# Patient Record
Sex: Female | Born: 1940 | Race: White | Hispanic: No | Smoking: Former smoker
Health system: Southern US, Community
[De-identification: ages and names within clinical notes are randomized; demographics above are authoritative.]

## PROBLEM LIST (undated history)

## (undated) DIAGNOSIS — R319 Hematuria, unspecified: Secondary | ICD-10-CM

## (undated) DIAGNOSIS — J189 Pneumonia, unspecified organism: Secondary | ICD-10-CM

## (undated) DIAGNOSIS — I10 Essential (primary) hypertension: Secondary | ICD-10-CM

## (undated) DIAGNOSIS — I8390 Asymptomatic varicose veins of unspecified lower extremity: Secondary | ICD-10-CM

## (undated) DIAGNOSIS — K5790 Diverticulosis of intestine, part unspecified, without perforation or abscess without bleeding: Secondary | ICD-10-CM

## (undated) DIAGNOSIS — F419 Anxiety disorder, unspecified: Secondary | ICD-10-CM

## (undated) DIAGNOSIS — M1990 Unspecified osteoarthritis, unspecified site: Secondary | ICD-10-CM

## (undated) DIAGNOSIS — G459 Transient cerebral ischemic attack, unspecified: Secondary | ICD-10-CM

## (undated) DIAGNOSIS — N393 Stress incontinence (female) (male): Secondary | ICD-10-CM

## (undated) DIAGNOSIS — R350 Frequency of micturition: Secondary | ICD-10-CM

## (undated) DIAGNOSIS — I639 Cerebral infarction, unspecified: Secondary | ICD-10-CM

## (undated) DIAGNOSIS — E785 Hyperlipidemia, unspecified: Secondary | ICD-10-CM

## (undated) DIAGNOSIS — Z87442 Personal history of urinary calculi: Secondary | ICD-10-CM

## (undated) DIAGNOSIS — H409 Unspecified glaucoma: Secondary | ICD-10-CM

## (undated) DIAGNOSIS — R0600 Dyspnea, unspecified: Secondary | ICD-10-CM

## (undated) DIAGNOSIS — R011 Cardiac murmur, unspecified: Secondary | ICD-10-CM

## (undated) DIAGNOSIS — F32A Depression, unspecified: Secondary | ICD-10-CM

## (undated) DIAGNOSIS — I480 Paroxysmal atrial fibrillation: Secondary | ICD-10-CM

## (undated) DIAGNOSIS — Z8701 Personal history of pneumonia (recurrent): Secondary | ICD-10-CM

## (undated) DIAGNOSIS — F329 Major depressive disorder, single episode, unspecified: Secondary | ICD-10-CM

## (undated) HISTORY — DX: Asymptomatic varicose veins of unspecified lower extremity: I83.90

## (undated) HISTORY — DX: Personal history of pneumonia (recurrent): Z87.01

## (undated) HISTORY — DX: Transient cerebral ischemic attack, unspecified: G45.9

## (undated) HISTORY — PX: CATARACT EXTRACTION: SUR2

## (undated) HISTORY — PX: COLONOSCOPY: SHX174

## (undated) HISTORY — DX: Depression, unspecified: F32.A

## (undated) HISTORY — PX: TONSILLECTOMY: SUR1361

## (undated) HISTORY — PX: LITHOTRIPSY: SUR834

## (undated) HISTORY — DX: Essential (primary) hypertension: I10

## (undated) HISTORY — PX: ABLATION SAPHENOUS VEIN W/ RFA: SUR11

## (undated) HISTORY — DX: Major depressive disorder, single episode, unspecified: F32.9

## (undated) HISTORY — PX: EYE SURGERY: SHX253

## (undated) HISTORY — DX: Hyperlipidemia, unspecified: E78.5

## (undated) HISTORY — PX: ABDOMINAL HYSTERECTOMY: SHX81

## (undated) HISTORY — DX: Unspecified glaucoma: H40.9

---
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
## 2000-09-13 ENCOUNTER — Ambulatory Visit (HOSPITAL_COMMUNITY): Admission: RE | Admit: 2000-09-13 | Discharge: 2000-09-13 | Payer: Self-pay | Admitting: Internal Medicine

## 2000-09-13 ENCOUNTER — Encounter: Payer: Self-pay | Admitting: Internal Medicine

## 2001-04-08 ENCOUNTER — Ambulatory Visit (HOSPITAL_COMMUNITY): Admission: RE | Admit: 2001-04-08 | Discharge: 2001-04-08 | Payer: Self-pay | Admitting: Internal Medicine

## 2001-04-08 ENCOUNTER — Encounter: Payer: Self-pay | Admitting: Internal Medicine

## 2002-11-26 ENCOUNTER — Ambulatory Visit (HOSPITAL_COMMUNITY): Admission: RE | Admit: 2002-11-26 | Discharge: 2002-11-26 | Payer: Self-pay | Admitting: Family Medicine

## 2002-11-26 ENCOUNTER — Encounter: Payer: Self-pay | Admitting: Family Medicine

## 2004-05-10 ENCOUNTER — Ambulatory Visit (HOSPITAL_COMMUNITY): Admission: RE | Admit: 2004-05-10 | Discharge: 2004-05-10 | Payer: Self-pay | Admitting: Internal Medicine

## 2005-08-21 ENCOUNTER — Ambulatory Visit (HOSPITAL_COMMUNITY): Admission: RE | Admit: 2005-08-21 | Discharge: 2005-08-21 | Payer: Self-pay | Admitting: Family Medicine

## 2006-05-10 ENCOUNTER — Ambulatory Visit: Payer: Self-pay | Admitting: Vascular Surgery

## 2006-05-15 ENCOUNTER — Ambulatory Visit: Payer: Self-pay | Admitting: Vascular Surgery

## 2006-07-17 ENCOUNTER — Ambulatory Visit: Payer: Self-pay | Admitting: Vascular Surgery

## 2006-07-23 ENCOUNTER — Ambulatory Visit (HOSPITAL_COMMUNITY): Admission: RE | Admit: 2006-07-23 | Discharge: 2006-07-23 | Payer: Self-pay | Admitting: Internal Medicine

## 2006-08-16 ENCOUNTER — Ambulatory Visit: Payer: Self-pay | Admitting: Vascular Surgery

## 2006-09-13 ENCOUNTER — Ambulatory Visit: Payer: Self-pay | Admitting: Vascular Surgery

## 2006-10-10 ENCOUNTER — Ambulatory Visit: Payer: Self-pay | Admitting: Vascular Surgery

## 2007-04-15 ENCOUNTER — Ambulatory Visit: Payer: Self-pay | Admitting: Cardiovascular Disease

## 2007-04-19 ENCOUNTER — Ambulatory Visit (HOSPITAL_COMMUNITY): Admission: RE | Admit: 2007-04-19 | Discharge: 2007-04-19 | Payer: Self-pay | Admitting: Cardiovascular Disease

## 2007-04-19 ENCOUNTER — Ambulatory Visit: Payer: Self-pay | Admitting: Internal Medicine

## 2007-08-23 ENCOUNTER — Ambulatory Visit (HOSPITAL_COMMUNITY): Admission: RE | Admit: 2007-08-23 | Discharge: 2007-08-23 | Payer: Self-pay | Admitting: General Surgery

## 2007-12-19 ENCOUNTER — Ambulatory Visit: Payer: Self-pay | Admitting: Cardiology

## 2007-12-19 ENCOUNTER — Ambulatory Visit (HOSPITAL_COMMUNITY): Admission: RE | Admit: 2007-12-19 | Discharge: 2007-12-19 | Payer: Self-pay | Admitting: Cardiovascular Disease

## 2007-12-19 ENCOUNTER — Encounter: Payer: Self-pay | Admitting: Cardiovascular Disease

## 2008-12-03 ENCOUNTER — Telehealth: Payer: Self-pay | Admitting: Cardiovascular Disease

## 2008-12-04 ENCOUNTER — Encounter: Payer: Self-pay | Admitting: Cardiology

## 2008-12-21 DIAGNOSIS — I10 Essential (primary) hypertension: Secondary | ICD-10-CM | POA: Insufficient documentation

## 2008-12-21 DIAGNOSIS — F329 Major depressive disorder, single episode, unspecified: Secondary | ICD-10-CM

## 2008-12-29 ENCOUNTER — Encounter: Payer: Self-pay | Admitting: Adult Health

## 2008-12-29 ENCOUNTER — Encounter (INDEPENDENT_AMBULATORY_CARE_PROVIDER_SITE_OTHER): Payer: Self-pay | Admitting: *Deleted

## 2008-12-29 ENCOUNTER — Ambulatory Visit (HOSPITAL_COMMUNITY): Admission: RE | Admit: 2008-12-29 | Discharge: 2008-12-29 | Payer: Self-pay | Admitting: Cardiology

## 2008-12-29 ENCOUNTER — Ambulatory Visit: Payer: Self-pay | Admitting: Cardiology

## 2008-12-29 DIAGNOSIS — R5383 Other fatigue: Secondary | ICD-10-CM

## 2008-12-29 DIAGNOSIS — R5381 Other malaise: Secondary | ICD-10-CM | POA: Insufficient documentation

## 2008-12-29 DIAGNOSIS — R9439 Abnormal result of other cardiovascular function study: Secondary | ICD-10-CM | POA: Insufficient documentation

## 2008-12-29 DIAGNOSIS — R0602 Shortness of breath: Secondary | ICD-10-CM

## 2008-12-29 LAB — CONVERTED CEMR LAB
CO2: 24 meq/L (ref 19–32)
Chloride: 103 meq/L (ref 96–112)
Eosinophils Absolute: 0.1 10*3/uL (ref 0.0–0.7)
Eosinophils Relative: 2 % (ref 0–5)
HCT: 43.6 % (ref 36.0–46.0)
Lymphs Abs: 1.6 10*3/uL (ref 0.7–4.0)
MCHC: 32.1 g/dL (ref 30.0–36.0)
MCV: 96 fL (ref 78.0–100.0)
Monocytes Relative: 9 % (ref 3–12)
Platelets: 177 10*3/uL (ref 150–400)
RBC: 4.54 M/uL (ref 3.87–5.11)
Sodium: 142 meq/L (ref 135–145)
WBC: 6.5 10*3/uL (ref 4.0–10.5)
aPTT: 31 s

## 2009-01-01 ENCOUNTER — Ambulatory Visit: Payer: Self-pay | Admitting: Cardiology

## 2009-01-01 ENCOUNTER — Inpatient Hospital Stay (HOSPITAL_BASED_OUTPATIENT_CLINIC_OR_DEPARTMENT_OTHER): Admission: RE | Admit: 2009-01-01 | Discharge: 2009-01-01 | Payer: Self-pay | Admitting: Cardiology

## 2009-01-01 ENCOUNTER — Encounter (INDEPENDENT_AMBULATORY_CARE_PROVIDER_SITE_OTHER): Payer: Self-pay | Admitting: *Deleted

## 2009-01-05 ENCOUNTER — Encounter: Payer: Self-pay | Admitting: Cardiology

## 2009-01-06 LAB — CONVERTED CEMR LAB
BUN: 18 mg/dL (ref 6–23)
CO2: 29 meq/L (ref 19–32)
Chloride: 101 meq/L (ref 96–112)
Creatinine, Ser: 0.73 mg/dL (ref 0.40–1.20)
Glucose, Bld: 111 mg/dL — ABNORMAL HIGH (ref 70–99)

## 2009-01-07 ENCOUNTER — Encounter: Payer: Self-pay | Admitting: Cardiology

## 2009-01-22 ENCOUNTER — Ambulatory Visit: Payer: Self-pay | Admitting: Cardiology

## 2010-06-23 LAB — POCT I-STAT 3, VENOUS BLOOD GAS (G3P V)
Acid-Base Excess: 1 mmol/L (ref 0.0–2.0)
O2 Saturation: 71 %

## 2010-06-23 LAB — POCT I-STAT 3, ART BLOOD GAS (G3+)
TCO2: 31 mmol/L (ref 0–100)
pH, Arterial: 7.424 — ABNORMAL HIGH (ref 7.350–7.400)

## 2010-08-02 NOTE — Assessment & Plan Note (Signed)
[facility]                       [facility] [facility] OFFICE NOTE   NAME:Kim Dixon, Kim Dixon                   MRN:          [id number]  DATE:04/15/2007                            DOB:          10-12-1940    This is a delightful overweight middle-aged white female referred by Dr.  Phillips Odor.  She has been having palpitations, dyspnea.  Coronary risk  factor hypertension, hyperlipidemia.  The patient had palpitations for  about 2 weeks.  They are intermittent.  They both lasts as long as 10  minutes.  They occur throughout the day.  She did not get a Holter  monitor.  She was not sure if they are related to any food that she  might of had or an allergy.  They seem to subsided over the last 2  weeks.  She has chronic exertional dyspnea that she ascribes to her  size.  She is a nonsmoker.  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  The patient feels that her blood pressure cholesterol  been under good control.  She has been compliant with her meds.  She is  extremely scared about her heart.  Her mother had bypass surgery before  and she seems real leery of having coronary artery disease.  The  patient's review of systems otherwise negative.  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  She has had vein stripping done by Dr.  Hart Rochester in both lower extremities recently.   She has not had a previous stress test or echo.   Her current medications include Hyzaar 12.5, Lipitor 10 a day, Lexapro  10 a day, Premarin 0.3 days, lactin eyedrops to both eyes q.h.s.,  multivitamins.  She denies any allergies.   FAMILY HISTORY:  Remarkable for father dying of lung cancer and an old  age.  Mother having bypass surgery in her 65s patient is retired.   She is happily married.  She has three children multiple  grandchildren.  She is active she enjoys pottery.  She is retired Consulting civil engineer.  She does not drink or smoke.   Past surgical history includes previous tonsillectomy, hysterectomy.   EXAM:  Is remarkable for weight of 263.  Normal affect, blood pressure  120/76, pulse 57 regular.  No no abnormal listed beats arrhythmias in PAC, PVCs.  Respiratory rate 16, afebrile.  HEENT:  Unremarkable.  NECK:  Supple.  No lymphadenopathy, thyromegaly, JVP elevation.  LUNGS:  Clear diaphragmatic motion.  No wheezing.  S1-S2 with distant heart sounds.  PMI not palpable.  ABDOMEN:  Benign bowel sounds are positive.  No AAA.  No past and.  Reflexes.  Distal pulses intact with mild lower extremity edema.  NEURO:  Nonfocal.  SKIN:  Warm and dry.   EKG is abnormal likely due to large amount of breast tissue with poor R  wave progression, isolated Q-wave in lead III.   IMPRESSION:  1. Palpitations of seem to be resolved probably benign.  If  recur,      give her an event monitor no indication for beta blocker at this      time.  2. Hypertension currently well-controlled.  Continue a low-salt diet      and Hyzaar.  3. Hyperlipidemia.  Continue Lipitor 10 mg a day.  Lipid liver profile      in 6 months.  4. Exertional dyspnea.  Doubt this is an anginal equivalent been given      the patient's abnormal EKG with poor R wave progression and concern      for coronary disease.  She had been referred for a stress Myoview      in an echocardiogram.  We will assess RV and LV function and rule      out her dyspnea is an anginal equivalent.   Overall I think the patient is doing fine.  I suspect that her symptoms  do not relate to significant structural heart disease.  She needs to  lose weight and she knows this.   I will see her in a p.r.n. basis of her stress test and echo are normal.     Peter C. Eden Emms, MD, [facility]  Electronically Signed    PCN/MedQ  DD: 04/15/2007  DT: 04/15/2007  Job #:  [id number]   cc:   Corrie Mckusick, M.D.

## 2010-08-02 NOTE — H&P (Signed)
NAME:  Kim Dixon, Kim Dixon NO.:  [id number]   MEDICAL RECORD NO.:  [id number]          PATIENT TYPE:  AMB   LOCATION:  DAY                           FACILITY:  [facility]   PHYSICIAN:  Dalia Heading, M.D.  DATE OF BIRTH:  02/05/1941   DATE OF ADMISSION:  DATE OF DISCHARGE:  LH                              HISTORY & PHYSICAL   CHIEF COMPLAINT:  Sebaceous cyst, back, recurrent.   HISTORY OF PRESENT ILLNESS:  The patient is a 70 year old white female  who is referred for evaluation and treatment of a recurrent sebaceous  cyst on her back.  It has been present for the past few years, recently  has increased in size and is causing her discomfort.   PAST MEDICAL HISTORY:  Depression and hypertension.   PAST SURGICAL HISTORY:  Hysterectomy and child birth.   CURRENT MEDICATIONS:  Premarin, Lexapro, Lipitor, Hyzaar, Dilantin, eye  drops, and vitamin C.   ALLERGIES:  ALEVE.   REVIEW OF SYSTEMS:  Noncontributory.   PHYSICAL EXAMINATION:  GENERAL:  The patient is a well-developed and  well-nourished white female in no acute distress.  LUNGS:  Clear to auscultation with equal breath sounds bilaterally.  HEART:  Regular rate and rhythm without S3, S4, or murmurs.  BACK:  A 3-cm sebaceous cyst in the upper portion of her back along the  midline.   IMPRESSION:  Sebaceous cyst, back.   PLAN:  The patient is scheduled for an excision of the sebaceous cyst,  back on August 23, 2007.  The risks and benefits of the procedure including  bleeding, infection, and recurrence of the cyst were fully explained to  the patient, gave informed consent.      Dalia Heading, M.D.  Electronically Signed     MAJ/MEDQ  D:  08/20/2007  T:  08/21/2007  Job:  [id number]   cc:   Short Stay Jeani Hawking   [facility]

## 2010-08-02 NOTE — Procedures (Signed)
NAME:  Kim Dixon, Kim Dixon NO.:  [id number]   MEDICAL RECORD NO.:  [id number]          PATIENT TYPE:  OUT   LOCATION:  RAD                           FACILITY:  [facility]   PHYSICIAN:  Pricilla Riffle, MD, FACCDATE OF BIRTH:  [birth year]   DATE OF PROCEDURE:  DATE OF DISCHARGE:                                ECHOCARDIOGRAM   REFERRING PHYSICIAN:  Madelin Rear. Sherwood Gambler, MD.   TEST/INDICATION:  A 70 year old with CAD, abnormal EKG.   A 2-D echo with echo Doppler:  Left ventricle is normal in size and  diastolic dimension of 41 mm.  The interventricular septum and posterior  wall are mildly thickened at 14 and 12 mm each.  Left atrium is upper  normal at 40 mm.  Right atrium and right ventricle are normal.  Aortic  root is normal at 27 mm.   The aortic valve is normal with no insufficiency.  Mitral valve, she has  a mildly myxomatous appearance, but no insufficiency.  No prolapse.  There is moderate annular calcification.   Pulmonic valve is normal with no insufficiency.  Tricuspid valve is  normal with trace insufficiency.   Overall LV systolic function is normal with an LVEF of approximately 55-  60%.  There is hypokinesis of the proximal inferior wall.   Note by Doppler, there appears to be mild diastolic dysfunction.   RV systolic function is normal.   No pericardial effusion is seen.   IMPRESSION:  Mild left ventricular hypertrophy.  Left ventricular  ejection fraction normal.  Difficult window, but basal inferior  hypokinesis.  Trace tricuspid insufficiency.      Pricilla Riffle, MD, [facility]  Electronically Signed     PVR/MEDQ  D:  04/19/2007  T:  04/19/2007  Job:  [id number]   cc:   Madelin Rear. Sherwood Gambler, MD  Fax: [phone]   Noralyn Pick. Eden Emms, MD, [facility]  [address]  [address]  [place]  [place] [address]

## 2010-08-02 NOTE — Op Note (Signed)
NAME:  Kim Dixon, MERKIN NO.:  [id number]   MEDICAL RECORD NO.:  [id number]          PATIENT TYPE:  AMB   LOCATION:  DAY                           FACILITY:  [facility]   PHYSICIAN:  Dalia Heading, M.D.  DATE OF BIRTH:  Nov 25, 1940   DATE OF PROCEDURE:  08/23/2007  DATE OF DISCHARGE:                               OPERATIVE REPORT   PREOPERATIVE DIAGNOSIS:  Recurrent sebaceous cyst, back.   POSTOPERATIVE DIAGNOSIS:  Recurrent sebaceous cyst, back.   PROCEDURE:  Excision of sebaceous cyst, back.   SURGEON:  Dalia Heading, MD   ANESTHESIA:  Local.   INDICATIONS:  The patient is a 70 year old white female, who presents  with a recurrent sebaceous cyst on her back.  It measures approximately  3 cm in its greatest diameter.  The risks and benefits of the procedure  including bleeding, infection, recurrence of the cyst were fully  explained to the patient, gave informed consent.   PROCEDURE NOTE:  The patient was placed in the right lateral decubitus  position.  The upper back was prepped and draped using the usual sterile  technique with Betadine.  Surgical site confirmation was performed.  1%  Xylocaine was used for local anesthesia.   An elliptical incision was made around the punctum.  The sebaceous cyst  was then removed in total without difficulty.  The wound was irrigated  with normal saline.  The subcutaneous layer was reapproximated using 4-0  Vicryl subcuticular suture.  Dermabond was then applied.   All tape and needle counts were correct at the end of procedure.  The  patient was transferred back to day surgery in stable condition.   COMPLICATIONS:  None.   SPECIMEN:  None.   BLOOD LOSS:  Minimal.      Dalia Heading, M.D.  Electronically Signed     MAJ/MEDQ  D:  08/23/2007  T:  08/23/2007  Job:  [id number]   cc:   [facility]

## 2011-01-10 ENCOUNTER — Encounter: Payer: Self-pay | Admitting: Cardiovascular Disease

## 2011-01-23 ENCOUNTER — Other Ambulatory Visit (HOSPITAL_COMMUNITY): Payer: Self-pay | Admitting: Family Medicine

## 2011-01-23 DIAGNOSIS — Z139 Encounter for screening, unspecified: Secondary | ICD-10-CM

## 2011-01-23 DIAGNOSIS — Z1231 Encounter for screening mammogram for malignant neoplasm of breast: Secondary | ICD-10-CM

## 2011-01-24 ENCOUNTER — Telehealth: Payer: Self-pay | Admitting: Internal Medicine

## 2011-01-24 NOTE — Telephone Encounter (Signed)
Received copies from [facility] 01/24/11. Forwarded  6pages to Dr. Nobie Putnam review.

## 2011-01-26 ENCOUNTER — Ambulatory Visit (HOSPITAL_COMMUNITY): Payer: Self-pay

## 2011-01-26 ENCOUNTER — Other Ambulatory Visit (HOSPITAL_COMMUNITY): Payer: Self-pay

## 2011-02-06 ENCOUNTER — Ambulatory Visit (HOSPITAL_COMMUNITY)
Admission: RE | Admit: 2011-02-06 | Discharge: 2011-02-06 | Disposition: A | Discharge status: Home / Self Care | Payer: Medicare Other | Source: Ambulatory Visit | Attending: Family Medicine | Admitting: Family Medicine

## 2011-02-06 DIAGNOSIS — Z1231 Encounter for screening mammogram for malignant neoplasm of breast: Secondary | ICD-10-CM | POA: Insufficient documentation

## 2011-02-06 DIAGNOSIS — Z139 Encounter for screening, unspecified: Secondary | ICD-10-CM

## 2011-02-06 DIAGNOSIS — M818 Other osteoporosis without current pathological fracture: Secondary | ICD-10-CM | POA: Insufficient documentation

## 2011-02-06 DIAGNOSIS — Z780 Asymptomatic menopausal state: Secondary | ICD-10-CM | POA: Insufficient documentation

## 2011-03-07 ENCOUNTER — Encounter: Payer: Self-pay | Admitting: Internal Medicine

## 2011-04-05 ENCOUNTER — Ambulatory Visit (AMBULATORY_SURGERY_CENTER): Payer: Medicare Other | Admitting: *Deleted

## 2011-04-05 VITALS — Ht 67.0 in | Wt 262.9 lb

## 2011-04-05 DIAGNOSIS — Z1211 Encounter for screening for malignant neoplasm of colon: Secondary | ICD-10-CM

## 2011-04-05 MED ORDER — PEG-KCL-NACL-NASULF-NA ASC-C 100 G PO SOLR: ORAL | Status: DC

## 2011-04-17 ENCOUNTER — Telehealth: Payer: Self-pay | Admitting: *Deleted

## 2011-04-17 NOTE — Telephone Encounter (Signed)
Spoke with patient and she states she is having trouble getting her insurance to cover procedure. States the "state office" closed due to weather and no one here could give her a ballpark number of what 25 % of procedure would be so she wants to cancel.

## 2011-04-17 NOTE — Telephone Encounter (Signed)
Received a telephone note from answering service that patient wants to cancel procedure on Wed. 30th bescause "you can't answer her questions, so she will look elsewhere." Attempted to call patient to discuss her questions but she is not home at present. Per per answering phone she should be returning in 1 1/2 hours.

## 2011-04-19 ENCOUNTER — Other Ambulatory Visit: Payer: Medicare Other | Admitting: Internal Medicine

## 2011-04-20 ENCOUNTER — Encounter (INDEPENDENT_AMBULATORY_CARE_PROVIDER_SITE_OTHER): Payer: Self-pay | Admitting: *Deleted

## 2012-02-13 ENCOUNTER — Other Ambulatory Visit (HOSPITAL_COMMUNITY): Payer: Self-pay | Admitting: Family Medicine

## 2012-02-13 DIAGNOSIS — Z0000 Encounter for general adult medical examination without abnormal findings: Secondary | ICD-10-CM

## 2012-02-13 DIAGNOSIS — Z139 Encounter for screening, unspecified: Secondary | ICD-10-CM

## 2012-02-20 ENCOUNTER — Ambulatory Visit (HOSPITAL_COMMUNITY): Payer: Medicare Other

## 2012-02-28 ENCOUNTER — Encounter (INDEPENDENT_AMBULATORY_CARE_PROVIDER_SITE_OTHER): Payer: Self-pay | Admitting: *Deleted

## 2012-02-29 ENCOUNTER — Ambulatory Visit (HOSPITAL_COMMUNITY): Payer: Medicare Other

## 2012-03-01 ENCOUNTER — Ambulatory Visit (HOSPITAL_COMMUNITY)
Admission: RE | Admit: 2012-03-01 | Discharge: 2012-03-01 | Disposition: A | Discharge status: Home / Self Care | Payer: Medicare Other | Source: Ambulatory Visit | Attending: Family Medicine | Admitting: Family Medicine

## 2012-03-01 DIAGNOSIS — Z1231 Encounter for screening mammogram for malignant neoplasm of breast: Secondary | ICD-10-CM | POA: Insufficient documentation

## 2012-03-01 DIAGNOSIS — Z0000 Encounter for general adult medical examination without abnormal findings: Secondary | ICD-10-CM

## 2012-03-01 DIAGNOSIS — Z139 Encounter for screening, unspecified: Secondary | ICD-10-CM

## 2013-04-18 ENCOUNTER — Encounter (INDEPENDENT_AMBULATORY_CARE_PROVIDER_SITE_OTHER): Payer: Self-pay | Admitting: *Deleted

## 2013-04-24 ENCOUNTER — Other Ambulatory Visit (INDEPENDENT_AMBULATORY_CARE_PROVIDER_SITE_OTHER): Payer: Self-pay | Admitting: *Deleted

## 2013-04-24 DIAGNOSIS — Z1211 Encounter for screening for malignant neoplasm of colon: Secondary | ICD-10-CM

## 2013-04-25 ENCOUNTER — Encounter (INDEPENDENT_AMBULATORY_CARE_PROVIDER_SITE_OTHER): Payer: Self-pay | Admitting: *Deleted

## 2013-04-25 NOTE — Telephone Encounter (Signed)
This encounter was created in error - please disregard.

## 2013-05-26 ENCOUNTER — Other Ambulatory Visit (HOSPITAL_COMMUNITY): Payer: Self-pay | Admitting: Internal Medicine

## 2013-05-26 DIAGNOSIS — Z1231 Encounter for screening mammogram for malignant neoplasm of breast: Secondary | ICD-10-CM

## 2013-05-29 ENCOUNTER — Ambulatory Visit (HOSPITAL_COMMUNITY)
Admission: RE | Admit: 2013-05-29 | Discharge: 2013-05-29 | Disposition: A | Discharge status: Home / Self Care | Payer: Medicare Other | Source: Ambulatory Visit | Attending: Internal Medicine | Admitting: Internal Medicine

## 2013-05-29 DIAGNOSIS — Z1231 Encounter for screening mammogram for malignant neoplasm of breast: Secondary | ICD-10-CM | POA: Insufficient documentation

## 2013-06-05 ENCOUNTER — Encounter (HOSPITAL_COMMUNITY): Payer: Self-pay

## 2013-06-06 ENCOUNTER — Telehealth (INDEPENDENT_AMBULATORY_CARE_PROVIDER_SITE_OTHER): Payer: Self-pay | Admitting: *Deleted

## 2013-06-06 NOTE — Telephone Encounter (Signed)
  Procedure: tcs  Reason/Indication:  screening  Has patient had this procedure before?  Yes, more than 10 years ago  If so, when, by whom and where?    Is there a family history of colon cancer?  no  Who?  What age when diagnosed?    Is patient diabetic?   no      Does patient have prosthetic heart valve?  no  Do you have a pacemaker?  no  Has patient ever had endocarditis? no  Has patient had joint replacement within last 12 months?  no  Does patient tend to be constipated or take laxatives? no  Is patient on Coumadin, Plavix and/or Aspirin? no  Medications: citalopram 20 mg daily, furosemide 20 mg tid, losartan/hctz 50/12 mg daily, meloxicam 7.5 mg daily, simvastatin 10 mg daily, lumegan 0.03% 1 drop each eye nightly, vit c 3000 mg daily, vit d 1000 iu daily  Allergies: aleve  Medication Adjustment:   Procedure date & time: 06/18/13

## 2013-06-09 NOTE — Telephone Encounter (Signed)
agree

## 2013-06-18 ENCOUNTER — Encounter (HOSPITAL_COMMUNITY)
Admission: RE | Disposition: A | Discharge status: Home / Self Care | Payer: Self-pay | Source: Ambulatory Visit | Attending: Internal Medicine

## 2013-06-18 ENCOUNTER — Encounter (HOSPITAL_COMMUNITY): Payer: Self-pay | Admitting: *Deleted

## 2013-06-18 ENCOUNTER — Ambulatory Visit (HOSPITAL_COMMUNITY)
Admission: RE | Admit: 2013-06-18 | Discharge: 2013-06-18 | Disposition: A | Discharge status: Home / Self Care | Payer: Medicare Other | Source: Ambulatory Visit | Attending: Internal Medicine | Admitting: Internal Medicine

## 2013-06-18 DIAGNOSIS — D126 Benign neoplasm of colon, unspecified: Secondary | ICD-10-CM | POA: Insufficient documentation

## 2013-06-18 DIAGNOSIS — F329 Major depressive disorder, single episode, unspecified: Secondary | ICD-10-CM | POA: Insufficient documentation

## 2013-06-18 DIAGNOSIS — K5730 Diverticulosis of large intestine without perforation or abscess without bleeding: Secondary | ICD-10-CM

## 2013-06-18 DIAGNOSIS — Z87891 Personal history of nicotine dependence: Secondary | ICD-10-CM | POA: Insufficient documentation

## 2013-06-18 DIAGNOSIS — K644 Residual hemorrhoidal skin tags: Secondary | ICD-10-CM | POA: Insufficient documentation

## 2013-06-18 DIAGNOSIS — I10 Essential (primary) hypertension: Secondary | ICD-10-CM | POA: Insufficient documentation

## 2013-06-18 DIAGNOSIS — F3289 Other specified depressive episodes: Secondary | ICD-10-CM | POA: Insufficient documentation

## 2013-06-18 DIAGNOSIS — Z1211 Encounter for screening for malignant neoplasm of colon: Secondary | ICD-10-CM | POA: Insufficient documentation

## 2013-06-18 DIAGNOSIS — E785 Hyperlipidemia, unspecified: Secondary | ICD-10-CM | POA: Insufficient documentation

## 2013-06-18 DIAGNOSIS — Z79899 Other long term (current) drug therapy: Secondary | ICD-10-CM | POA: Insufficient documentation

## 2013-06-18 HISTORY — PX: COLONOSCOPY: SHX5424

## 2013-06-18 SURGERY — COLONOSCOPY
Anesthesia: Moderate Sedation

## 2013-06-18 MED ORDER — MIDAZOLAM HCL 5 MG/5ML IJ SOLN
INTRAMUSCULAR | Status: AC
  Filled 2013-06-18: qty 10

## 2013-06-18 MED ORDER — MEPERIDINE HCL 50 MG/ML IJ SOLN
INTRAMUSCULAR | Status: AC
  Filled 2013-06-18: qty 1

## 2013-06-18 MED ORDER — MEPERIDINE HCL 50 MG/ML IJ SOLN
INTRAMUSCULAR | Status: DC | PRN
  Administered 2013-06-18 (×2): 25 mg via INTRAVENOUS

## 2013-06-18 MED ORDER — MIDAZOLAM HCL 5 MG/5ML IJ SOLN
INTRAMUSCULAR | Status: DC | PRN
  Administered 2013-06-18 (×3): 2 mg via INTRAVENOUS

## 2013-06-18 MED ORDER — STERILE WATER FOR IRRIGATION IR SOLN
Status: DC | PRN
  Administered 2013-06-18: 09:00:00

## 2013-06-18 MED ORDER — SODIUM CHLORIDE 0.9 % IV SOLN
INTRAVENOUS | Status: DC
  Administered 2013-06-18: 08:00:00 via INTRAVENOUS

## 2013-06-18 NOTE — Op Note (Addendum)
COLONOSCOPY PROCEDURE REPORT  PATIENT:  Kim Dixon  MR#:  [id number] Birthdate:  1940-03-28, 73 y.o., female Endoscopist:  Dr. Rogene Houston, MD Referred By:  Dr. Asencion Noble, MD  Procedure Date: 06/18/2013  Procedure:   Colonoscopy with snare polypectomy.  Indications:  Patient is 74 year old Caucasian female who is here for average risk screening colonoscopy.  Informed Consent:  The procedure and risks were reviewed with the patient and informed consent was obtained.  Medications:  Demerol 50 mg IV Versed 6 mg IV  Description of procedure:  After a digital rectal exam was performed, that colonoscope was advanced from the anus through the rectum and colon to the area of the cecum, ileocecal valve and appendiceal orifice. The cecum was deeply intubated. These structures were well-seen and photographed for the record. From the level of the cecum and ileocecal valve, the scope was slowly and cautiously withdrawn. The mucosal surfaces were carefully surveyed utilizing scope tip to flexion to facilitate fold flattening as needed. The scope was pulled down into the rectum where a thorough exam including retroflexion was performed.  Findings:   Prep excellent. Small polyp at ascending colon coagulated using snare tip. 10 mm pedunculated polyp with surface erosion snared from hepatic flexure. Scattered diverticula throughout the colon but most of these were at sigmoid colon. Normal rectal mucosa. Small hemorrhoids below the dentate line.   Therapeutic/Diagnostic Maneuvers Performed:  See above  Complications:  None  Cecal Withdrawal Time:  12 minutes  Impression:  Examination performed to cecum. Small polyp at ascending colon coagulated using snare tip. 10 mm pedunculated polyp with surface erosion snared from hepatic flexure. Pancolonic diverticulosis. Small external hemorrhoids.  Recommendations:  Standard instructions given. High fiber diet. I will contact patient with  biopsy results and further recommendations.  [name] U  06/18/2013 9:15 AM  CC: Dr. Asencion Noble, MD & Dr. Rayne Du ref. provider found

## 2013-06-18 NOTE — Discharge Instructions (Signed)
Resume usual medications and high fiber diet. No aspirin or NSAIDs for 1 week. No driving for 24 hours. Physician will call with biopsy results    Colonoscopy, Care After Refer to this sheet in the next few weeks. These instructions provide you with information on caring for yourself after your procedure. Your health care provider may also give you more specific instructions. Your treatment has been planned according to current medical practices, but problems sometimes occur. Call your health care provider if you have any problems or questions after your procedure. WHAT TO EXPECT AFTER THE PROCEDURE  After your procedure, it is typical to have the following:  A small amount of blood in your stool.  Moderate amounts of gas and mild abdominal cramping or bloating. HOME CARE INSTRUCTIONS  Do not drive, operate machinery, or sign important documents for 24 hours.  You may shower and resume your regular physical activities, but move at a slower pace for the first 24 hours.  Take frequent rest periods for the first 24 hours.  Walk around or put a warm pack on your abdomen to help reduce abdominal cramping and bloating.  Drink enough fluids to keep your urine clear or pale yellow.  You may resume your normal diet as instructed by your health care provider. Avoid heavy or fried foods that are hard to digest.  Avoid drinking alcohol for 24 hours or as instructed by your health care provider.  Only take over-the-counter or prescription medicines as directed by your health care provider.  If a tissue sample (biopsy) was taken during your procedure:  Do not take aspirin or blood thinners for 7 days, or as instructed by your health care provider.  Do not drink alcohol for 7 days, or as instructed by your health care provider.  Eat soft foods for the first 24 hours. SEEK MEDICAL CARE IF: You have persistent spotting of blood in your stool 2 3 days after the procedure. SEEK IMMEDIATE  MEDICAL CARE IF:  You have more than a small spotting of blood in your stool.  You pass large blood clots in your stool.  Your abdomen is swollen (distended).  You have nausea or vomiting.  You have a fever.  You have increasing abdominal pain that is not relieved with medicine. Document Released: 10/19/2003 Document Revised: 12/25/2012 Document Reviewed: 11/11/2012 [facility] Patient Information 2014 [place]. Hemorrhoids Hemorrhoids are swollen veins around the rectum or anus. There are two types of hemorrhoids:   Internal hemorrhoids. These occur in the veins just inside the rectum. They may poke through to the outside and become irritated and painful.  External hemorrhoids. These occur in the veins outside the anus and can be felt as a painful swelling or hard lump near the anus. CAUSES  Pregnancy.   Obesity.   Constipation or diarrhea.   Straining to have a bowel movement.   Sitting for long periods on the toilet.  Heavy lifting or other activity that caused you to strain.  Anal intercourse. SYMPTOMS   Pain.   Anal itching or irritation.   Rectal bleeding.   Fecal leakage.   Anal swelling.   One or more lumps around the anus.  DIAGNOSIS  Your caregiver may be able to diagnose hemorrhoids by visual examination. Other examinations or tests that may be performed include:   Examination of the rectal area with a gloved hand (digital rectal exam).   Examination of anal canal using a small tube (scope).   A blood test if you  have lost a significant amount of blood.  A test to look inside the colon (sigmoidoscopy or colonoscopy). TREATMENT Most hemorrhoids can be treated at home. However, if symptoms do not seem to be getting better or if you have a lot of rectal bleeding, your caregiver may perform a procedure to help make the hemorrhoids get smaller or remove them completely. Possible treatments include:   Placing a rubber band at the base  of the hemorrhoid to cut off the circulation (rubber band ligation).   Injecting a chemical to shrink the hemorrhoid (sclerotherapy).   Using a tool to burn the hemorrhoid (infrared light therapy).   Surgically removing the hemorrhoid (hemorrhoidectomy).   Stapling the hemorrhoid to block blood flow to the tissue (hemorrhoid stapling).  HOME CARE INSTRUCTIONS   Eat foods with fiber, such as whole grains, beans, nuts, fruits, and vegetables. Ask your doctor about taking products with added fiber in them (fibersupplements).  Increase fluid intake. Drink enough water and fluids to keep your urine clear or pale yellow.   Exercise regularly.   Go to the bathroom when you have the urge to have a bowel movement. Do not wait.   Avoid straining to have bowel movements.   Keep the anal area dry and clean. Use wet toilet paper or moist towelettes after a bowel movement.   Medicated creams and suppositories may be used or applied as directed.   Only take over-the-counter or prescription medicines as directed by your caregiver.   Take warm sitz baths for 15 20 minutes, 3 4 times a day to ease pain and discomfort.   Place ice packs on the hemorrhoids if they are tender and swollen. Using ice packs between sitz baths may be helpful.   Put ice in a plastic bag.   Place a towel between your skin and the bag.   Leave the ice on for 15 20 minutes, 3 4 times a day.   Do not use a donut-shaped pillow or sit on the toilet for long periods. This increases blood pooling and pain.  SEEK MEDICAL CARE IF:  You have increasing pain and swelling that is not controlled by treatment or medicine.  You have uncontrolled bleeding.  You have difficulty or you are unable to have a bowel movement.  You have pain or inflammation outside the area of the hemorrhoids. MAKE SURE YOU:  Understand these instructions.  Will watch your condition.  Will get help right away if you are not  doing well or get worse. Document Released: 03/03/2000 Document Revised: 02/21/2012 Document Reviewed: 01/09/2012 [facility] Patient Information 2014 [place]. Diverticulosis Diverticulosis is a common condition that develops when small pouches (diverticula) form in the wall of the colon. The risk of diverticulosis increases with age. It happens more often in people who eat a low-fiber diet. Most individuals with diverticulosis have no symptoms. Those individuals with symptoms usually experience abdominal pain, constipation, or loose stools (diarrhea). HOME CARE INSTRUCTIONS   Increase the amount of fiber in your diet as directed by your caregiver or dietician. This may reduce symptoms of diverticulosis.  Your caregiver may recommend taking a dietary fiber supplement.  Drink at least 6 to 8 glasses of water each day to prevent constipation.  Try not to strain when you have a bowel movement.  Your caregiver may recommend avoiding nuts and seeds to prevent complications, although this is still an uncertain benefit.  Only take over-the-counter or prescription medicines for pain, discomfort, or fever as directed by your  caregiver. FOODS WITH HIGH FIBER CONTENT INCLUDE:  Fruits. Apple, peach, pear, tangerine, raisins, prunes.  Vegetables. Brussels sprouts, asparagus, broccoli, cabbage, carrot, cauliflower, romaine lettuce, spinach, summer squash, tomato, winter squash, zucchini.  Starchy Vegetables. Baked beans, kidney beans, lima beans, split peas, lentils, potatoes (with skin).  Grains. Whole wheat bread, brown rice, bran flake cereal, plain oatmeal, white rice, shredded wheat, bran muffins. SEEK IMMEDIATE MEDICAL CARE IF:   You develop increasing pain or severe bloating.  You have an oral temperature above 102 F (38.9 C), not controlled by medicine.  You develop vomiting or bowel movements that are bloody or black. Document Released: 12/02/2003 Document Revised: 05/29/2011  Document Reviewed: 08/04/2009 [facility] Patient Information 2014 [place]. Colon Polyps Polyps are lumps of extra tissue growing inside the body. Polyps can grow in the large intestine (colon). Most colon polyps are noncancerous (benign). However, some colon polyps can become cancerous over time. Polyps that are larger than a pea may be harmful. To be safe, caregivers remove and test all polyps. CAUSES  Polyps form when mutations in the genes cause your cells to grow and divide even though no more tissue is needed. RISK FACTORS There are a number of risk factors that can increase your chances of getting colon polyps. They include:  Being older than 50 years.  Family history of colon polyps or colon cancer.  Long-term colon diseases, such as colitis or Crohn disease.  Being overweight.  Smoking.  Being inactive.  Drinking too much alcohol. SYMPTOMS  Most small polyps do not cause symptoms. If symptoms are present, they may include:  Blood in the stool. The stool may look dark red or black.  Constipation or diarrhea that lasts longer than 1 week. DIAGNOSIS People often do not know they have polyps until their caregiver finds them during a regular checkup. Your caregiver can use 4 tests to check for polyps:  Digital rectal exam. The caregiver wears gloves and feels inside the rectum. This test would find polyps only in the rectum.  Barium enema. The caregiver puts a liquid called barium into your rectum before taking X-rays of your colon. Barium makes your colon look white. Polyps are dark, so they are easy to see in the X-ray pictures.  Sigmoidoscopy. A thin, flexible tube (sigmoidoscope) is placed into your rectum. The sigmoidoscope has a light and tiny camera in it. The caregiver uses the sigmoidoscope to look at the last third of your colon.  Colonoscopy. This test is like sigmoidoscopy, but the caregiver looks at the entire colon. This is the most common method for  finding and removing polyps. TREATMENT  Any polyps will be removed during a sigmoidoscopy or colonoscopy. The polyps are then tested for cancer. PREVENTION  To help lower your risk of getting more colon polyps:  Eat plenty of fruits and vegetables. Avoid eating fatty foods.  Do not smoke.  Avoid drinking alcohol.  Exercise every day.  Lose weight if recommended by your caregiver.  Eat plenty of calcium and folate. Foods that are rich in calcium include milk, cheese, and broccoli. Foods that are rich in folate include chickpeas, kidney beans, and spinach. HOME CARE INSTRUCTIONS Keep all follow-up appointments as directed by your caregiver. You may need periodic exams to check for polyps. SEEK MEDICAL CARE IF: You notice bleeding during a bowel movement. Document Released: 12/01/2003 Document Revised: 05/29/2011 Document Reviewed: 05/16/2011 [facility] Patient Information 2014 [place].

## 2013-06-18 NOTE — H&P (Signed)
Kim Dixon is an 73 y.o. female.   Chief Complaint: Patient's here for colonoscopy. HPI: Patient is 73 year old Caucasian female who is here for screening colonoscopy. She denies abdominal pain change in bowel habits or rectal bleeding. Patient's last colonoscopy was over 10 years ago. Family history is negative for CRC.  Past Medical History  Diagnosis Date  . Depression   . Hypertension   . Hyperlipidemia   . Glaucoma     Past Surgical History  Procedure Laterality Date  . Abdominal hysterectomy    . Colonoscopy      Family History  Problem Relation Age of Onset  . Coronary artery disease Mother   . Lung cancer Father   . Colon cancer Neg Hx   . Stomach cancer Neg Hx    Social History:  reports that she quit smoking about 20 years ago. Her smoking use included Cigarettes. She has a 60 pack-year smoking history. She has never used smokeless tobacco. She reports that she drinks about 4.2 ounces of alcohol per week. She reports that she does not use illicit drugs.  Allergies:  Allergies  Allergen Reactions  . Naproxen Sodium     Tightness in throat    Medications Prior to Admission  Medication Sig Dispense Refill  . Ascorbic Acid (VITAMIN C) 1000 MG tablet Take 3,000 mg by mouth daily.      . bimatoprost (LUMIGAN) 0.03 % ophthalmic solution Place 1 drop into both eyes at bedtime.      . cholecalciferol (VITAMIN D) 1000 UNITS tablet Take 1,000 Units by mouth daily.      . citalopram (CELEXA) 20 MG tablet Take 20 mg by mouth daily.      . furosemide (LASIX) 20 MG tablet Take 60 mg by mouth daily.       Marland Kitchen losartan-hydrochlorothiazide (HYZAAR) 50-12.5 MG per tablet Take 1 tablet by mouth daily.      . meloxicam (MOBIC) 7.5 MG tablet Take 7.5 mg by mouth daily as needed for pain.       . simvastatin (ZOCOR) 10 MG tablet Take 10 mg by mouth at bedtime.          No results found for this or any previous visit (from the past 48 hour(s)). No results  found.  ROS  Blood pressure 160/74, pulse 55, temperature 98 F (36.7 C), temperature source Oral, resp. rate 28, height 5\' 7"  (1.702 m), weight 238 lb (107.956 kg), SpO2 96.00%. Physical Exam  Constitutional: She appears well-developed and well-nourished.  HENT:  Mouth/Throat: Oropharynx is clear and moist.  Eyes: Conjunctivae are normal. No scleral icterus.  Neck: No thyromegaly present.  Cardiovascular: Normal rate, regular rhythm and normal heart sounds.   No murmur heard. Respiratory: Effort normal and breath sounds normal.  GI: Soft. She exhibits no distension and no mass. There is no tenderness.  Musculoskeletal: She exhibits no edema.  Lymphadenopathy:    She has no cervical adenopathy.  Neurological: She is alert.  Skin: Skin is warm and dry.     Assessment/Plan Average risk screening colonoscopy.  REHMAN,NAJEEB U 06/18/2013, 8:42 AM

## 2013-06-23 ENCOUNTER — Encounter (INDEPENDENT_AMBULATORY_CARE_PROVIDER_SITE_OTHER): Payer: Self-pay | Admitting: *Deleted

## 2013-06-24 ENCOUNTER — Encounter (HOSPITAL_COMMUNITY): Payer: Self-pay | Admitting: Internal Medicine

## 2014-01-16 ENCOUNTER — Telehealth: Payer: Self-pay

## 2014-01-16 NOTE — Telephone Encounter (Signed)
Pt. called and reported bulging varicose veins in bilateral legs with right > left.  Stated she has had vein laser treatment in the remote past.  Reported she went to be fitted for knee high compression stockings this morning, and couldn't wear them, due to the increased bulging of the VV when the compression stocking was applied.  Was advised to sched. Appt. With Dr. Kellie Simmering.  Denies any open sores.  Stated has bilateral lower leg swelling from foot to knees.  Advised to wrap legs with Ace bandages foot to thigh, if unable to wear compression hose at this time.  Also encouraged to elevate her legs at regular intervals.  Will return call to pt. w/ future appt. Verb. Understanding.

## 2014-01-19 NOTE — Telephone Encounter (Signed)
Left appointment information for pt at home #/ dpm

## 2014-01-22 ENCOUNTER — Other Ambulatory Visit: Payer: Self-pay | Admitting: *Deleted

## 2014-01-22 DIAGNOSIS — I83893 Varicose veins of bilateral lower extremities with other complications: Secondary | ICD-10-CM

## 2014-01-23 ENCOUNTER — Encounter: Payer: Self-pay | Admitting: Vascular Surgery

## 2014-01-26 ENCOUNTER — Ambulatory Visit (INDEPENDENT_AMBULATORY_CARE_PROVIDER_SITE_OTHER): Payer: Medicare Other | Admitting: Vascular Surgery

## 2014-01-26 ENCOUNTER — Ambulatory Visit (HOSPITAL_COMMUNITY)
Admission: RE | Admit: 2014-01-26 | Discharge: 2014-01-26 | Disposition: A | Discharge status: Home / Self Care | Payer: Medicare Other | Source: Ambulatory Visit | Attending: Vascular Surgery | Admitting: Vascular Surgery

## 2014-01-26 ENCOUNTER — Encounter: Payer: Self-pay | Admitting: Vascular Surgery

## 2014-01-26 VITALS — BP 175/94 | HR 63 | Resp 16 | Ht 64.5 in | Wt 237.0 lb

## 2014-01-26 DIAGNOSIS — I83893 Varicose veins of bilateral lower extremities with other complications: Secondary | ICD-10-CM

## 2014-01-26 DIAGNOSIS — I83899 Varicose veins of unspecified lower extremities with other complications: Secondary | ICD-10-CM | POA: Insufficient documentation

## 2014-01-26 DIAGNOSIS — I83812 Varicose veins of left lower extremities with pain: Secondary | ICD-10-CM

## 2014-01-26 NOTE — Progress Notes (Signed)
Subjective:     Patient ID: Kim Dixon, female   DOB: Apr 16, 1940, 73 y.o.   MRN: [id number]  HPI this 73 year old female is known to me having undergone bilateral laser ablation of great saphenous veins in 2008 for painful varicosities. She had a good result. Over the past year or so she has developed large bulging varicosities particularly in the right anterior thigh and around the right knee laterally. These have become heavy and throbbing with increasing discomfort as the day progresses. She does not were elastic compression stockings at present time. She has no history of DVT or thrombophlebitis. She does have swelling in both ankles as the day progresses and has noticed some darkening of the skin.  Past Medical History  Diagnosis Date  . Depression   . Hypertension   . Hyperlipidemia   . Glaucoma   . Varicose veins     History  Substance Use Topics  . Smoking status: Former Smoker -- 2.00 packs/day for 30 years    Types: Cigarettes    Quit date: 04/04/1993  . Smokeless tobacco: Never Used  . Alcohol Use: 4.2 oz/week    7 Glasses of wine per week    Family History  Problem Relation Age of Onset  . Coronary artery disease Mother   . Lung cancer Father   . Colon cancer Neg Hx   . Stomach cancer Neg Hx     Allergies  Allergen Reactions  . Naproxen Sodium     Tightness in throat    Current outpatient prescriptions: Ascorbic Acid (VITAMIN C) 1000 MG tablet, Take 3,000 mg by mouth daily., Disp: , Rfl: ;  bimatoprost (LUMIGAN) 0.03 % ophthalmic solution, Place 1 drop into both eyes at bedtime., Disp: , Rfl: ;  cholecalciferol (VITAMIN D) 1000 UNITS tablet, Take 1,000 Units by mouth daily., Disp: , Rfl: ;  citalopram (CELEXA) 20 MG tablet, Take 20 mg by mouth daily., Disp: , Rfl:  furosemide (LASIX) 20 MG tablet, Take 60 mg by mouth daily. , Disp: , Rfl: ;  losartan-hydrochlorothiazide (HYZAAR) 50-12.5 MG per tablet, Take 1 tablet by mouth daily., Disp: , Rfl: ;  meloxicam  (MOBIC) 7.5 MG tablet, Take 7.5 mg by mouth daily as needed for pain. , Disp: , Rfl: ;  simvastatin (ZOCOR) 10 MG tablet, Take 10 mg by mouth at bedtime.  , Disp: , Rfl:   BP 175/94 mmHg  Pulse 63  Resp 16  Ht 5' 4.5" (1.638 m)  Wt 237 lb (107.502 kg)  BMI 40.07 kg/m2  Body mass index is 40.07 kg/(m^2).          Review of Systems Denies chest pain, dyspnea on exertion, PND, orthopnea, hemoptysis, claudication. Biggest complaint is bilateral knee pain from osteoarthritis. Other systems negative and a complete review of systems     Objective:   Physical Exam BP 175/94 mmHg  Pulse 63  Resp 16  Ht 5' 4.5" (1.638 m)  Wt 237 lb (107.502 kg)  BMI 40.07 kg/m2  Gen.-alert and oriented x3 in no apparent distress-obeseHEENT normal for age Lungs no rhonchi or wheezing Cardiovascular regular rhythm no murmurs carotid pulses 3+ palpable no bruits audible Abdomen soft nontender no palpable masses-obese Musculoskeletal free of  major deformities Skin clear -no rashes Neurologic normal Lower extremities 3+ femoral and dorsalis pedis pulses palpable bilaterally with 1+ edema bilaterally There are large bulging varicosities in the right anterior thigh extending lateral to the knee and into the pretibial region with hyperpigmentation lower third right  leg but no active ulcer. Left leg has some smaller varicosities in the medial and anterior thigh with 1+ edema but no active ulceration.  Today I ordered bilateral venous duplex exam which I reviewed and interpreted. The right leg reveals total closure of the right great saphenous vein medially with a large anterior accessory branch along the anterior aspect of the leg extending up to and including the saphenofemoral junction which causes reflux and supplies the painful varicosities and anterior thigh. There is no DVT. 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 These are causing symptoms which are affecting patient's daily living.     Plan:         #1 long leg elastic compression stockings 20-30 mm gradient #2 elevate legs as much as possible #3 ibuprofen daily on a regular basis for pain #4 return in 3 months-if no significant improvement then patient will need #1 laser ablation anterior accessory branch right great saphenous vein followed by 3 month period and then evaluation for stab phlebectomy of residual painful varicosities. The symptoms are affecting patient's daily living and she will return in 3 months for treatment

## 2014-03-18 ENCOUNTER — Other Ambulatory Visit (HOSPITAL_COMMUNITY): Payer: Self-pay | Admitting: Internal Medicine

## 2014-03-18 ENCOUNTER — Ambulatory Visit (HOSPITAL_COMMUNITY)
Admission: RE | Admit: 2014-03-18 | Discharge: 2014-03-18 | Disposition: A | Discharge status: Home / Self Care | Payer: Medicare Other | Source: Ambulatory Visit | Attending: Internal Medicine | Admitting: Internal Medicine

## 2014-03-18 DIAGNOSIS — R110 Nausea: Secondary | ICD-10-CM

## 2014-03-18 DIAGNOSIS — N132 Hydronephrosis with renal and ureteral calculous obstruction: Secondary | ICD-10-CM | POA: Diagnosis not present

## 2014-03-18 DIAGNOSIS — K5730 Diverticulosis of large intestine without perforation or abscess without bleeding: Secondary | ICD-10-CM | POA: Diagnosis not present

## 2014-03-18 DIAGNOSIS — M5136 Other intervertebral disc degeneration, lumbar region: Secondary | ICD-10-CM | POA: Insufficient documentation

## 2014-03-18 DIAGNOSIS — M545 Low back pain: Secondary | ICD-10-CM | POA: Diagnosis present

## 2014-03-18 DIAGNOSIS — R1032 Left lower quadrant pain: Secondary | ICD-10-CM | POA: Diagnosis present

## 2014-03-18 DIAGNOSIS — M4186 Other forms of scoliosis, lumbar region: Secondary | ICD-10-CM | POA: Diagnosis not present

## 2014-03-18 DIAGNOSIS — R1084 Generalized abdominal pain: Secondary | ICD-10-CM

## 2014-03-18 DIAGNOSIS — R935 Abnormal findings on diagnostic imaging of other abdominal regions, including retroperitoneum: Secondary | ICD-10-CM | POA: Diagnosis not present

## 2014-03-18 DIAGNOSIS — R112 Nausea with vomiting, unspecified: Secondary | ICD-10-CM | POA: Insufficient documentation

## 2014-03-18 LAB — POCT I-STAT CREATININE: CREATININE: 1.3 mg/dL — AB (ref 0.50–1.10)

## 2014-03-18 MED ORDER — IOHEXOL 300 MG/ML  SOLN
100.0000 mL | Freq: Once | INTRAMUSCULAR | Status: AC | PRN
  Administered 2014-03-18: 100 mL via INTRAVENOUS

## 2014-03-26 ENCOUNTER — Other Ambulatory Visit: Payer: Self-pay | Admitting: Urology

## 2014-04-02 ENCOUNTER — Encounter (HOSPITAL_COMMUNITY): Payer: Self-pay | Admitting: *Deleted

## 2014-04-08 MED ORDER — GENTAMICIN IN SALINE 1.6-0.9 MG/ML-% IV SOLN
80.0000 mg | INTRAVENOUS | Status: DC
  Filled 2014-04-08: qty 50

## 2014-04-08 MED ORDER — GENTAMICIN SULFATE 40 MG/ML IJ SOLN
5.0000 mg/kg | INTRAVENOUS | Status: AC
  Administered 2014-04-09: 380 mg via INTRAVENOUS
  Filled 2014-04-08: qty 9.5

## 2014-04-09 ENCOUNTER — Encounter (HOSPITAL_COMMUNITY): Payer: Self-pay | Admitting: *Deleted

## 2014-04-09 ENCOUNTER — Encounter (HOSPITAL_COMMUNITY)
Admission: RE | Disposition: A | Discharge status: Home / Self Care | Payer: Self-pay | Source: Ambulatory Visit | Attending: Urology

## 2014-04-09 ENCOUNTER — Ambulatory Visit (HOSPITAL_COMMUNITY)
Admission: RE | Admit: 2014-04-09 | Discharge: 2014-04-09 | Disposition: A | Discharge status: Home / Self Care | Payer: Medicare Other | Source: Ambulatory Visit | Attending: Urology | Admitting: Urology

## 2014-04-09 ENCOUNTER — Ambulatory Visit (HOSPITAL_COMMUNITY): Payer: Medicare Other

## 2014-04-09 DIAGNOSIS — N202 Calculus of kidney with calculus of ureter: Secondary | ICD-10-CM | POA: Diagnosis not present

## 2014-04-09 DIAGNOSIS — E785 Hyperlipidemia, unspecified: Secondary | ICD-10-CM | POA: Insufficient documentation

## 2014-04-09 DIAGNOSIS — N201 Calculus of ureter: Secondary | ICD-10-CM

## 2014-04-09 DIAGNOSIS — F329 Major depressive disorder, single episode, unspecified: Secondary | ICD-10-CM | POA: Diagnosis not present

## 2014-04-09 DIAGNOSIS — I10 Essential (primary) hypertension: Secondary | ICD-10-CM | POA: Insufficient documentation

## 2014-04-09 DIAGNOSIS — Z87891 Personal history of nicotine dependence: Secondary | ICD-10-CM | POA: Insufficient documentation

## 2014-04-09 LAB — CREATININE, SERUM
Creatinine, Ser: 1.1 mg/dL (ref 0.50–1.10)
GFR calc Af Amer: 56 mL/min — ABNORMAL LOW (ref >90)
GFR calc non Af Amer: 49 mL/min — ABNORMAL LOW (ref >90)

## 2014-04-09 LAB — GLUCOSE, CAPILLARY: Glucose-Capillary: 130 mg/dL — ABNORMAL HIGH (ref 70–99)

## 2014-04-09 SURGERY — LITHOTRIPSY, ESWL
Anesthesia: LOCAL | Laterality: Left

## 2014-04-09 MED ORDER — SODIUM CHLORIDE 0.9 % IV SOLN
INTRAVENOUS | Status: DC
  Administered 2014-04-09: 12:00:00 via INTRAVENOUS

## 2014-04-09 MED ORDER — SENNOSIDES-DOCUSATE SODIUM 8.6-50 MG PO TABS: 1.0000 | ORAL_TABLET | Freq: Two times a day (BID) | ORAL | Status: DC

## 2014-04-09 MED ORDER — TAMSULOSIN HCL 0.4 MG PO CAPS: 0.4000 mg | ORAL_CAPSULE | Freq: Every day | ORAL | Status: DC

## 2014-04-09 MED ORDER — OXYCODONE-ACETAMINOPHEN 5-325 MG PO TABS: 1.0000 | ORAL_TABLET | Freq: Four times a day (QID) | ORAL | Status: DC | PRN

## 2014-04-09 MED ORDER — DIPHENHYDRAMINE HCL 25 MG PO CAPS
25.0000 mg | ORAL_CAPSULE | ORAL | Status: AC
  Administered 2014-04-09: 25 mg via ORAL
  Filled 2014-04-09: qty 1

## 2014-04-09 MED ORDER — DIAZEPAM 5 MG PO TABS
10.0000 mg | ORAL_TABLET | ORAL | Status: AC
  Administered 2014-04-09: 10 mg via ORAL
  Filled 2014-04-09: qty 2

## 2014-04-09 NOTE — H&P (Signed)
Kim Dixon is an 74 y.o. female.    Chief Complaint: Pre-Op Left Shockwave Lithotripsy  HPI:   1 - Nephrolithiasis - 26mm left UPJ stone with mod hydro by CT 02/2014 on eval left abdominal pain. No additional Stones. Stone is 39mm, 700 HU density, 12cm skin-stone distance, lateral to L3 on scout images. No fevers. She in planning trip to [place] first weekend of February to [place] to visit her younger brother who teaches [place] to [place] astronauts.   PMH sig for CHF/Lasix (well controlled), MSK pain / Mobic, benign hyst. No ischemic heart disease.  Her PCP is Kim Noble MD.  Today " Kim Dixon " is seen to proceed with shockwave lithotripsy. No interval fevers or stone passage. Most recnet UCX negative.   Past Medical History  Diagnosis Date  . Depression   . Hypertension   . Hyperlipidemia   . Glaucoma   . Varicose veins     Past Surgical History  Procedure Laterality Date  . Abdominal hysterectomy    . Colonoscopy    . Colonoscopy N/A 06/18/2013    Procedure: COLONOSCOPY;  Surgeon: Rogene Houston, MD;  Location: [facility];  Service: Endoscopy;  Laterality: N/A;  56    Family History  Problem Relation Age of Onset  . Coronary artery disease Mother   . Lung cancer Father   . Colon cancer Neg Hx   . Stomach cancer Neg Hx    Social History:  reports that she quit smoking about 21 years ago. Her smoking use included Cigarettes. She has a 60 pack-year smoking history. She has never used smokeless tobacco. She reports that she drinks about 4.2 oz of alcohol per week. She reports that she does not use illicit drugs.  Allergies:  Allergies  Allergen Reactions  . Naproxen Sodium     Tightness in throat    No prescriptions prior to admission    No results found for this or any previous visit (from the past 48 hour(s)). No results found.  Review of Systems  Constitutional: Negative.   HENT: Negative.   Eyes: Negative.   Respiratory: Negative.   Cardiovascular: Negative.    Gastrointestinal: Negative.   Genitourinary: Positive for flank pain.  Musculoskeletal: Negative.   Skin: Negative.   Neurological: Negative.   Endo/Heme/Allergies: Negative.   Psychiatric/Behavioral: Negative.     Height 5\' 5"  (1.651 m), weight 103.42 kg (228 lb). Physical Exam  Constitutional: She appears well-developed.  HENT:  Head: Normocephalic.  Eyes: Pupils are equal, round, and reactive to light.  Neck: Normal range of motion.  Cardiovascular: Normal rate.   Respiratory: Effort normal.  GI: Soft.  Significant truncal obesity  Genitourinary:  Mild lt CVAT  Musculoskeletal: Normal range of motion.  Neurological: She is alert.  Skin: Skin is warm.  Psychiatric: She has a normal mood and affect. Her behavior is normal. Judgment and thought content normal.     Assessment/Plan  1 - Nephrolithiasis -   We rediscussed shockwave lithotripsy in detail as well as my "rule of 9s" with stones <7mm, less than 900 HU, and skin to stone distance <9cm having approximately 90% treatment success with single session of treatment. We then addressed how stones that are larger, more dense, and in patients with less favorable anatomy have incrementally decreased success rates. We discussed risks including, bleeding, infection, hematoma, loss of kidney, need for staged therapy, need for adjunctive therapy and requirement to refrain from any anticoagulants, anti-platelet or aspirin-like products peri-procedureally. After careful  consideration, the patient has chosen to proceed.   Solly Derasmo 04/09/2014, 8:09 AM

## 2014-04-09 NOTE — Discharge Instructions (Signed)
1 - You may have urinary urgency (bladder spasms), bloody urine on / off, and pass small stone material on / off x several days. This is normal.  2 - Call MD or go to ER for fever >102, severe pain / nausea / vomiting not relieved by medications, or acute change in medical status

## 2014-04-28 ENCOUNTER — Ambulatory Visit: Payer: Medicare Other | Admitting: Vascular Surgery

## 2014-05-11 ENCOUNTER — Encounter: Payer: Self-pay | Admitting: Vascular Surgery

## 2014-05-12 ENCOUNTER — Encounter: Payer: Self-pay | Admitting: Vascular Surgery

## 2014-05-12 ENCOUNTER — Other Ambulatory Visit: Payer: Self-pay | Admitting: *Deleted

## 2014-05-12 ENCOUNTER — Ambulatory Visit (INDEPENDENT_AMBULATORY_CARE_PROVIDER_SITE_OTHER): Payer: Medicare Other | Admitting: Vascular Surgery

## 2014-05-12 VITALS — BP 133/78 | HR 64 | Resp 18 | Ht 65.0 in | Wt 223.0 lb

## 2014-05-12 DIAGNOSIS — I83891 Varicose veins of right lower extremities with other complications: Secondary | ICD-10-CM | POA: Diagnosis not present

## 2014-05-12 NOTE — Progress Notes (Signed)
Subjective:     Patient ID: Kim Dixon, female   DOB: 1940-07-07, 74 y.o.   MRN: [id number]  HPI this 74 year old female returns for further follow-up regarding her painful varicosities in the right leg. She has tried long-leg elastic compression stockings 20-30 millimeter gradient as well as elevation ibuprofen and continues to have aching throbbing and burning discomfort in the right leg and progressive edema in the ankles as progresses. She had previously undergone laser ablation of the right great saphenous vein in 2008 and did well for several years until she developed these recurrent painful varicosities. She now has gross reflux in the large anterior accessory branch of the great saphenous vein causing her symptoms. She has no history of DVT thrombophlebitis or stasis ulcers.  Past Medical History  Diagnosis Date  . Depression   . Hypertension   . Hyperlipidemia   . Glaucoma   . Varicose veins     History  Substance Use Topics  . Smoking status: Former Smoker -- 2.00 packs/day for 30 years    Types: Cigarettes    Quit date: 04/04/1993  . Smokeless tobacco: Never Used  . Alcohol Use: 4.2 oz/week    7 Glasses of wine per week    Family History  Problem Relation Age of Onset  . Coronary artery disease Mother   . Lung cancer Father   . Colon cancer Neg Hx   . Stomach cancer Neg Hx     Allergies  Allergen Reactions  . Naproxen Sodium     Tightness in throat     Current outpatient prescriptions:  .  Ascorbic Acid (VITAMIN C) 1000 MG tablet, Take 3,000 mg by mouth daily., Disp: , Rfl:  .  bimatoprost (LUMIGAN) 0.03 % ophthalmic solution, Place 1 drop into both eyes at bedtime., Disp: , Rfl:  .  cholecalciferol (VITAMIN D) 1000 UNITS tablet, Take 1,000 Units by mouth daily., Disp: , Rfl:  .  citalopram (CELEXA) 20 MG tablet, Take 20 mg by mouth every morning. , Disp: , Rfl:  .  furosemide (LASIX) 20 MG tablet, Take 60 mg by mouth every morning. , Disp: , Rfl:  .   ibuprofen (ADVIL,MOTRIN) 200 MG tablet, Take 200 mg by mouth as needed., Disp: , Rfl:  .  losartan-hydrochlorothiazide (HYZAAR) 100-25 MG per tablet, Take 1 tablet by mouth every morning., Disp: , Rfl:  .  meloxicam (MOBIC) 7.5 MG tablet, Take 7.5 mg by mouth daily as needed for pain. , Disp: , Rfl:  .  senna-docusate (SENOKOT-S) 8.6-50 MG per tablet, Take 1 tablet by mouth 2 (two) times daily. While taking pain meds to prevent constipation, Disp: 30 tablet, Rfl: 0 .  simvastatin (ZOCOR) 10 MG tablet, Take 10 mg by mouth at bedtime.  , Disp: , Rfl:  .  tamsulosin (FLOMAX) 0.4 MG CAPS capsule, Take 1 capsule (0.4 mg total) by mouth daily. To help pass stone fragments after lithotripsy x 2 weeks., Disp: 14 capsule, Rfl: 0 .  oxyCODONE-acetaminophen (ROXICET) 5-325 MG per tablet, Take 1-2 tablets by mouth every 6 (six) hours as needed for moderate pain or severe pain. After lithotripsy (Patient not taking: Reported on 05/12/2014), Disp: 30 tablet, Rfl: 0  BP 133/78 mmHg  Pulse 64  Resp 18  Ht 5\' 5"  (1.651 m)  Wt 223 lb (101.152 kg)  BMI 37.11 kg/m2  Body mass index is 37.11 kg/(m^2).           Review of Systems denies chest pain, dyspnea on exertion,  PND, orthopnea, hemoptysis    Objective:   Physical Exam BP 133/78 mmHg  Pulse 64  Resp 18  Ht 5\' 5"  (1.651 m)  Wt 223 lb (101.152 kg)  BMI 37.11 kg/m2  Gen. well-developed well-nourished female no apparent stress alert and oriented 3 Lungs no rhonchi or wheezing Right leg with bulging varicosities beginning in the mid anterior thigh extending lateral to the knee and into the lateral calf area with 1+ edema. No hyperpigmentation or active ulceration is noted. 3+ dorsalis pedis pulse palpable.  Today I reviewed the previous duplex exam 3 months ago and also performed an independent bedside sono site exam. The patient does have a large    anterior accessory great saphenous vein supplying these bulging varicosities which has gross reflux  throughout up to and including the saphenofemoral junction  Assessment:     painful recurrent varicosities right leg due to gross reflux right great saphenous vein-anterior accessory branch causing symptoms which are affecting patient's daily living and resistant to conservative measures including long-leg elastic compression stockings 20-30 millimeter gradient, elevation, and ibuprofen     Plan:     Patient needs #1 laser ablation right great saphenous vein-anterior accessory branch followed by a 3 month waiting period She would then return to be evaluated for possible stab phlebectomy of secondary painful varicosities Will proceed with precertification to perform this in the near future

## 2014-06-08 ENCOUNTER — Encounter: Payer: Self-pay | Admitting: Vascular Surgery

## 2014-06-08 ENCOUNTER — Ambulatory Visit (INDEPENDENT_AMBULATORY_CARE_PROVIDER_SITE_OTHER): Payer: Medicare Other | Admitting: Vascular Surgery

## 2014-06-08 VITALS — BP 143/66 | HR 64 | Resp 16 | Ht 63.0 in | Wt 229.0 lb

## 2014-06-08 DIAGNOSIS — I83891 Varicose veins of right lower extremities with other complications: Secondary | ICD-10-CM

## 2014-06-08 NOTE — Progress Notes (Signed)
Subjective:     Patient ID: Kim Dixon, female   DOB: 11/18/1940, 74 y.o.   MRN: [id number]  HPI This 74 year old female had laser ablation of the anterior accessory branch right great saphenous vein performed under local tumescent anesthesia for venous hypertension with painful varicosities. A total of 1600 joules of energy was utilized. She tolerated the procedure well.  Past Medical History  Diagnosis Date  . Depression   . Hypertension   . Hyperlipidemia   . Glaucoma   . Varicose veins     History  Substance Use Topics  . Smoking status: Former Smoker -- 2.00 packs/day for 30 years    Types: Cigarettes    Quit date: 04/04/1993  . Smokeless tobacco: Never Used  . Alcohol Use: 4.2 oz/week    7 Glasses of wine per week    Family History  Problem Relation Age of Onset  . Coronary artery disease Mother   . Lung cancer Father   . Colon cancer Neg Hx   . Stomach cancer Neg Hx     Allergies  Allergen Reactions  . Naproxen Sodium     Tightness in throat     Current outpatient prescriptions:  .  Ascorbic Acid (VITAMIN C) 1000 MG tablet, Take 3,000 mg by mouth daily., Disp: , Rfl:  .  bimatoprost (LUMIGAN) 0.03 % ophthalmic solution, Place 1 drop into both eyes at bedtime., Disp: , Rfl:  .  cholecalciferol (VITAMIN D) 1000 UNITS tablet, Take 1,000 Units by mouth daily., Disp: , Rfl:  .  citalopram (CELEXA) 20 MG tablet, Take 20 mg by mouth every morning. , Disp: , Rfl:  .  furosemide (LASIX) 20 MG tablet, Take 60 mg by mouth every morning. , Disp: , Rfl:  .  ibuprofen (ADVIL,MOTRIN) 200 MG tablet, Take 200 mg by mouth as needed., Disp: , Rfl:  .  losartan-hydrochlorothiazide (HYZAAR) 100-25 MG per tablet, Take 1 tablet by mouth every morning., Disp: , Rfl:  .  meloxicam (MOBIC) 7.5 MG tablet, Take 7.5 mg by mouth daily as needed for pain. , Disp: , Rfl:  .  oxyCODONE-acetaminophen (ROXICET) 5-325 MG per tablet, Take 1-2 tablets by mouth every 6 (six) hours as needed  for moderate pain or severe pain. After lithotripsy (Patient not taking: Reported on 05/12/2014), Disp: 30 tablet, Rfl: 0 .  senna-docusate (SENOKOT-S) 8.6-50 MG per tablet, Take 1 tablet by mouth 2 (two) times daily. While taking pain meds to prevent constipation, Disp: 30 tablet, Rfl: 0 .  simvastatin (ZOCOR) 10 MG tablet, Take 10 mg by mouth at bedtime.  , Disp: , Rfl:  .  tamsulosin (FLOMAX) 0.4 MG CAPS capsule, Take 1 capsule (0.4 mg total) by mouth daily. To help pass stone fragments after lithotripsy x 2 weeks., Disp: 14 capsule, Rfl: 0  Filed Vitals:   06/08/14 1253  BP: 143/66  Pulse: 64  Resp: 16  Height: 5\' 3"  (1.6 m)  Weight: 229 lb (103.874 kg)    Body mass index is 40.58 kg/(m^2).           Review of Systems     Objective:   Physical Exam BP 143/66 mmHg  Pulse 64  Resp 16  Ht 5\' 3"  (1.6 m)  Wt 229 lb (103.874 kg)  BMI 40.58 kg/m2       Assessment:      Laser ablation anterior accessory branch right great saphenous vein performed under local tumescent anesthesia for painful varicosities     Plan:  return in one week for venous duplex exam to confirm closure right great saphenous vein

## 2014-06-08 NOTE — Progress Notes (Signed)
Laser Ablation Procedure    Date: 06/08/2014   Kim Dixon DOB:Sep 07, 1940  Consent signed: Yes    Surgeon:  Dr. Nelda Severe. Kellie Simmering  Procedure: Laser Ablation: right Greater Saphenous Vein  BP 143/66 mmHg  Pulse 64  Resp 16  Ht 5\' 3"  (1.6 m)  Wt 229 lb (103.874 kg)  BMI 40.58 kg/m2  Tumescent Anesthesia: 350 cc 0.9% NaCl with 50 cc Lidocaine HCL with 1% Epi and 15 cc 8.4% NaHCO3  Local Anesthesia: 5 cc Lidocaine HCL and NaHCO3 (ratio 2:1)  Pulsed Mode: 15 watts, 557ms delay, 1.0 duration  Total Energy:   1641           Total Pulses:   110             Total Time: 1:49    Patient tolerated procedure well  Notes:   Description of Procedure:  After marking the course of the secondary varicosities, the patient was placed on the operating table in the supine position, and the right leg was prepped and draped in sterile fashion.   Local anesthetic was administered and under ultrasound guidance the saphenous vein was accessed with a micro needle and guide wire; then the mirco puncture sheath was placed.  A guide wire was inserted saphenofemoral junction , followed by a 5 french sheath.  The position of the sheath and then the laser fiber below the junction was confirmed using the ultrasound.  Tumescent anesthesia was administered along the course of the saphenous vein using ultrasound guidance. The patient was placed in Trendelenburg position and protective laser glasses were placed on patient and staff, and the laser was fired at 15 watts continuous mode advancing 1-86mm/second for a total of 1641 joules.

## 2014-06-09 ENCOUNTER — Telehealth: Payer: Self-pay | Admitting: *Deleted

## 2014-06-09 NOTE — Telephone Encounter (Signed)
Left a meassage on her phone to call me if she is having any questions or concerns.

## 2014-06-11 ENCOUNTER — Encounter: Payer: Self-pay | Admitting: Vascular Surgery

## 2014-06-15 ENCOUNTER — Ambulatory Visit (HOSPITAL_COMMUNITY)
Admission: RE | Admit: 2014-06-15 | Discharge: 2014-06-15 | Disposition: A | Discharge status: Home / Self Care | Payer: Medicare Other | Source: Ambulatory Visit | Attending: Vascular Surgery | Admitting: Vascular Surgery

## 2014-06-15 ENCOUNTER — Ambulatory Visit (INDEPENDENT_AMBULATORY_CARE_PROVIDER_SITE_OTHER): Payer: Medicare Other | Admitting: Vascular Surgery

## 2014-06-15 ENCOUNTER — Encounter: Payer: Self-pay | Admitting: Vascular Surgery

## 2014-06-15 VITALS — BP 138/70 | HR 74 | Resp 16 | Ht 63.0 in | Wt 230.0 lb

## 2014-06-15 DIAGNOSIS — I83893 Varicose veins of bilateral lower extremities with other complications: Secondary | ICD-10-CM | POA: Diagnosis not present

## 2014-06-15 DIAGNOSIS — I83891 Varicose veins of right lower extremities with other complications: Secondary | ICD-10-CM

## 2014-06-15 NOTE — Progress Notes (Signed)
Subjective:     Patient ID: Kim Dixon, female   DOB: 12-24-1940, 74 y.o.   MRN: [id number]  HPI this 74 year old female returns 1 week post laser ablation of the anterior accessory branch of the right great saphenous vein for gross reflux supplying a large number of bulging painful varicosities. She has worn her elastic compression stocking and taken ibuprofen as instructed. She has had mild discomfort in the anterior and proximal thigh. She has not noted any distal edema. She has no history of DVT or thrombophlebitis. She is having mild edema in the contralateral left leg.  Past Medical History  Diagnosis Date  . Depression   . Hypertension   . Hyperlipidemia   . Glaucoma   . Varicose veins     History  Substance Use Topics  . Smoking status: Former Smoker -- 2.00 packs/day for 30 years    Types: Cigarettes    Quit date: 04/04/1993  . Smokeless tobacco: Never Used  . Alcohol Use: 4.2 oz/week    7 Glasses of wine per week    Family History  Problem Relation Age of Onset  . Coronary artery disease Mother   . Lung cancer Father   . Colon cancer Neg Hx   . Stomach cancer Neg Hx     Allergies  Allergen Reactions  . Naproxen Sodium     Tightness in throat     Current outpatient prescriptions:  .  Ascorbic Acid (VITAMIN C) 1000 MG tablet, Take 3,000 mg by mouth daily., Disp: , Rfl:  .  bimatoprost (LUMIGAN) 0.03 % ophthalmic solution, Place 1 drop into both eyes at bedtime., Disp: , Rfl:  .  cholecalciferol (VITAMIN D) 1000 UNITS tablet, Take 1,000 Units by mouth daily., Disp: , Rfl:  .  citalopram (CELEXA) 20 MG tablet, Take 20 mg by mouth every morning. , Disp: , Rfl:  .  furosemide (LASIX) 20 MG tablet, Take 60 mg by mouth every morning. , Disp: , Rfl:  .  ibuprofen (ADVIL,MOTRIN) 200 MG tablet, Take 200 mg by mouth as needed., Disp: , Rfl:  .  losartan-hydrochlorothiazide (HYZAAR) 100-25 MG per tablet, Take 1 tablet by mouth every morning., Disp: , Rfl:  .   meloxicam (MOBIC) 7.5 MG tablet, Take 7.5 mg by mouth daily as needed for pain. , Disp: , Rfl:  .  oxyCODONE-acetaminophen (ROXICET) 5-325 MG per tablet, Take 1-2 tablets by mouth every 6 (six) hours as needed for moderate pain or severe pain. After lithotripsy (Patient not taking: Reported on 05/12/2014), Disp: 30 tablet, Rfl: 0 .  senna-docusate (SENOKOT-S) 8.6-50 MG per tablet, Take 1 tablet by mouth 2 (two) times daily. While taking pain meds to prevent constipation, Disp: 30 tablet, Rfl: 0 .  simvastatin (ZOCOR) 10 MG tablet, Take 10 mg by mouth at bedtime.  , Disp: , Rfl:  .  tamsulosin (FLOMAX) 0.4 MG CAPS capsule, Take 1 capsule (0.4 mg total) by mouth daily. To help pass stone fragments after lithotripsy x 2 weeks., Disp: 14 capsule, Rfl: 0  Filed Vitals:   06/15/14 1037 06/15/14 1039  BP: 144/70 138/70  Pulse: 64 74  Resp: 18 16  Height: 5\' 3"  (1.6 m)   Weight: 230 lb (104.327 kg)     Body mass index is 40.75 kg/(m^2).           Review of Systems denies chest pain, dyspnea on exertion, PND, orthopnea, hemoptysis, claudication.    is planning to see Dr. Ihor Gully for evaluation of contralateral  left knee replacement in the near future Objective:   Physical Exam BP 138/70 mmHg  Pulse 74  Resp 16  Ht 5\' 3"  (1.6 m)  Wt 230 lb (104.327 kg)  BMI 40.75 kg/m2  Gen. well-developed well-nourished female no apparent stress alert and oriented 3 Lungs no rhonchi or wheezing Right lower extremity with moderate ecchymosis in the medial thigh proximally. Mild tenderness to palpation over the great saphenous vein anteriorly. Bulging varicosities in the distal anterior and lateral thigh and lateral calf region are less tense than previously but still quite visible. No distal edema noted. 3+ dorsalis pedis pulse palpable.  Today I ordered a venous duplex exam of the right leg which I reviewed and interpreted. There is no DVT. There is total closure of the anterior accessory branch of the  right great saphenous vein.     Assessment:     Successful laser ablation right great saphenous vein-anterior accessory branch for gross reflux supplying large number of large painful varicosities     Plan:     Patient will return in 3 months for further evaluation to see if stab phlebectomy of these painful varicosities will be indicated.

## 2014-06-15 NOTE — Progress Notes (Signed)
Filed Vitals:   06/15/14 1037 06/15/14 1039  BP: 144/70 138/70  Pulse: 64 74  Resp: 18 16  Height: 5\' 3"  (1.6 m)   Weight: 230 lb (104.327 kg)

## 2014-08-14 NOTE — H&P (Signed)
TOTAL KNEE ADMISSION H&P  Patient is being admitted for left total knee arthroplasty.  Subjective:  Chief Complaint:   Left knee primary OA / pain.  HPI: Kim Dixon, 74 y.o. female, has a history of pain and functional disability in the left knee due to arthritis and has failed non-surgical conservative treatments for greater than 12 weeks to includeNSAID's and/or analgesics, corticosteriod injections and activity modification.  Onset of symptoms was gradual, starting years ago with gradually worsening course since that time. The patient noted no past surgery on the left knee(s).  Patient currently rates pain in the left knee(s) at 9 out of 10 with activity. Patient has worsening of pain with activity and weight bearing, pain that interferes with activities of daily living, pain with passive range of motion, crepitus and joint swelling.  Patient has evidence of periarticular osteophytes and joint space narrowing by imaging studies.  There is no active infection.  Risks, benefits and expectations were discussed with the patient.  Risks including but not limited to the risk of anesthesia, blood clots, nerve damage, blood vessel damage, failure of the prosthesis, infection and up to and including death.  Patient understand the risks, benefits and expectations and wishes to proceed with surgery.   PCP: Asencion Noble, MD  D/C Plans:      Home with HHPT  Post-op Meds:       No Rx given   Tranexamic Acid:      To be given - IV  Decadron:      Is to be given  FYI:     ASA post-op  Norco post-op  Celebrex and ASA ok (allergy is only to Naproxen)    Patient Active Problem List   Diagnosis Date Noted  . Varicose veins of leg with complications 20/25/4270  . FATIGUE / MALAISE 12/29/2008  . DYSPNEA 12/29/2008  . ABNORMAL CV (STRESS) TEST 12/29/2008  . DEPRESSION 12/21/2008  . HYPERTENSION 12/21/2008   Past Medical History  Diagnosis Date  . Depression   . Hypertension   . Hyperlipidemia    . Glaucoma   . Varicose veins     Past Surgical History  Procedure Laterality Date  . Abdominal hysterectomy    . Colonoscopy    . Colonoscopy N/A 06/18/2013    Procedure: COLONOSCOPY;  Surgeon: Rogene Houston, MD;  Location: [facility];  Service: Endoscopy;  Laterality: N/A;  830    No prescriptions prior to admission   Allergies  Allergen Reactions  . Naproxen Sodium     Tightness in throat    History  Substance Use Topics  . Smoking status: Former Smoker -- 2.00 packs/day for 30 years    Types: Cigarettes    Quit date: 04/04/1993  . Smokeless tobacco: Never Used  . Alcohol Use: 4.2 oz/week    7 Glasses of wine per week    Family History  Problem Relation Age of Onset  . Coronary artery disease Mother   . Lung cancer Father   . Colon cancer Neg Hx   . Stomach cancer Neg Hx      Review of Systems  Constitutional: Positive for malaise/fatigue.  HENT: Negative.   Eyes: Negative.   Respiratory: Negative.   Cardiovascular: Negative.   Gastrointestinal: Negative.   Genitourinary: Positive for frequency.  Musculoskeletal: Positive for joint pain.  Skin: Negative.   Neurological: Negative.   Endo/Heme/Allergies: Negative.   Psychiatric/Behavioral: Positive for depression.    Objective:  Physical Exam  Constitutional: She is oriented  to person, place, and time. She appears well-developed and well-nourished.  HENT:  Head: Normocephalic and atraumatic.  Eyes: Pupils are equal, round, and reactive to light.  Neck: Neck supple. No JVD present. No tracheal deviation present. No thyromegaly present.  Cardiovascular: Normal rate, regular rhythm and intact distal pulses.   Murmur heard. Respiratory: Effort normal and breath sounds normal. No stridor. No respiratory distress. She has no wheezes.  GI: Soft. There is no tenderness. There is no guarding.  Musculoskeletal:       Left knee: She exhibits decreased range of motion, swelling and bony tenderness. She exhibits  no ecchymosis, no deformity, no laceration and no erythema. Tenderness found.  Lymphadenopathy:    She has no cervical adenopathy.  Neurological: She is alert and oriented to person, place, and time. A sensory deficit (occasionally, bilateral LE) is present.  Skin: Skin is warm and dry.  Psychiatric: She has a normal mood and affect.      Labs:  Estimated body mass index is 40.75 kg/(m^2) as calculated from the following:   Height as of 06/15/14: 5\' 3"  (1.6 m).   Weight as of 06/15/14: 104.327 kg (230 lb).   Imaging Review Plain radiographs demonstrate severe degenerative joint disease of the left knee(s). The overall alignment is neutral. The bone quality appears to be good for age and reported activity level.  Assessment/Plan:  End stage arthritis, left knee   The patient history, physical examination, clinical judgment of the provider and imaging studies are consistent with end stage degenerative joint disease of the left knee(s) and total knee arthroplasty is deemed medically necessary. The treatment options including medical management, injection therapy arthroscopy and arthroplasty were discussed at length. The risks and benefits of total knee arthroplasty were presented and reviewed. The risks due to aseptic loosening, infection, stiffness, patella tracking problems, thromboembolic complications and other imponderables were discussed. The patient acknowledged the explanation, agreed to proceed with the plan and consent was signed. Patient is being admitted for inpatient treatment for surgery, pain control, PT, OT, prophylactic antibiotics, VTE prophylaxis, progressive ambulation and ADL's and discharge planning. The patient is planning to be discharged home with home health services.     [place] P[place] Glynnis Gavel   PA-C  08/14/2014, 6:17 PM

## 2014-08-19 ENCOUNTER — Other Ambulatory Visit (HOSPITAL_COMMUNITY): Payer: Self-pay | Admitting: Internal Medicine

## 2014-08-19 DIAGNOSIS — Z1231 Encounter for screening mammogram for malignant neoplasm of breast: Secondary | ICD-10-CM

## 2014-08-21 ENCOUNTER — Encounter (HOSPITAL_COMMUNITY)
Admission: RE | Admit: 2014-08-21 | Discharge: 2014-08-21 | Disposition: A | Discharge status: Home / Self Care | Payer: Medicare Other | Source: Ambulatory Visit | Attending: Orthopedic Surgery | Admitting: Orthopedic Surgery

## 2014-08-21 ENCOUNTER — Encounter (HOSPITAL_COMMUNITY): Payer: Self-pay

## 2014-08-21 DIAGNOSIS — I10 Essential (primary) hypertension: Secondary | ICD-10-CM | POA: Diagnosis not present

## 2014-08-21 DIAGNOSIS — Z01810 Encounter for preprocedural cardiovascular examination: Secondary | ICD-10-CM | POA: Insufficient documentation

## 2014-08-21 DIAGNOSIS — Z01812 Encounter for preprocedural laboratory examination: Secondary | ICD-10-CM | POA: Diagnosis not present

## 2014-08-21 HISTORY — DX: Cardiac murmur, unspecified: R01.1

## 2014-08-21 HISTORY — DX: Stress incontinence (female) (male): N39.3

## 2014-08-21 HISTORY — DX: Anxiety disorder, unspecified: F41.9

## 2014-08-21 HISTORY — DX: Unspecified osteoarthritis, unspecified site: M19.90

## 2014-08-21 HISTORY — DX: Pneumonia, unspecified organism: J18.9

## 2014-08-21 HISTORY — DX: Frequency of micturition: R35.0

## 2014-08-21 LAB — URINALYSIS, ROUTINE W REFLEX MICROSCOPIC
BILIRUBIN URINE: NEGATIVE
Glucose, UA: NEGATIVE mg/dL
Hgb urine dipstick: NEGATIVE
Ketones, ur: NEGATIVE mg/dL
Leukocytes, UA: NEGATIVE
Nitrite: NEGATIVE
PROTEIN: NEGATIVE mg/dL
SPECIFIC GRAVITY, URINE: 1.008 (ref 1.005–1.030)
UROBILINOGEN UA: 0.2 mg/dL (ref 0.0–1.0)
pH: 7 (ref 5.0–8.0)

## 2014-08-21 LAB — CBC
HCT: 45 % (ref 36.0–46.0)
Hemoglobin: 14.4 g/dL (ref 12.0–15.0)
MCH: 31.3 pg (ref 26.0–34.0)
MCHC: 32 g/dL (ref 30.0–36.0)
MCV: 97.8 fL (ref 78.0–100.0)
Platelets: 175 10*3/uL (ref 150–400)
RBC: 4.6 MIL/uL (ref 3.87–5.11)
RDW: 13.4 % (ref 11.5–15.5)
WBC: 5.4 10*3/uL (ref 4.0–10.5)

## 2014-08-21 LAB — SURGICAL PCR SCREEN
MRSA, PCR: NEGATIVE
STAPHYLOCOCCUS AUREUS: NEGATIVE

## 2014-08-21 LAB — BASIC METABOLIC PANEL
Anion gap: 8 (ref 5–15)
BUN: 24 mg/dL — ABNORMAL HIGH (ref 6–20)
CHLORIDE: 97 mmol/L — AB (ref 101–111)
CO2: 34 mmol/L — ABNORMAL HIGH (ref 22–32)
CREATININE: 0.59 mg/dL (ref 0.44–1.00)
Calcium: 10.6 mg/dL — ABNORMAL HIGH (ref 8.9–10.3)
GFR calc non Af Amer: >60 mL/min (ref >60)
Glucose, Bld: 125 mg/dL — ABNORMAL HIGH (ref 65–99)
Potassium: 4 mmol/L (ref 3.5–5.1)
Sodium: 139 mmol/L (ref 135–145)

## 2014-08-21 LAB — PROTIME-INR
INR: 0.93 (ref 0.00–1.49)
PROTHROMBIN TIME: 12.7 s (ref 11.6–15.2)

## 2014-08-21 LAB — APTT: aPTT: 30 seconds (ref 24–37)

## 2014-08-21 NOTE — Progress Notes (Signed)
Duplicated surgical orders placed in epic

## 2014-08-21 NOTE — Patient Instructions (Signed)
Kim Dixon  08/21/2014   Your procedure is scheduled on: Tuesday August 25, 2014   Report to [facility] Main  Entrance and follow signs to               [place] arrive at 11: 20 AM.  Call this number if you have problems the morning of surgery [phone]   Remember: ONLY 1 PERSON MAY GO WITH YOU TO SHORT STAY TO GET  READY MORNING OF [place].  Do not eat food After Midnight but may take clear liquids till 8:20 am day of surgery then nothing by mouth.      Take these medicines the morning of surgery with A SIP OF WATER: Citalopram (Celexa)                               You may not have any metal on your body including hair pins and              piercings  Do not wear jewelry, make-up, lotions, powders or perfumes, deodorant             Do not wear nail polish.  Do not shave  48 hours prior to surgery.           Do not bring valuables to the hospital. [place].  Contacts, dentures or bridgework may not be worn into surgery.  Leave suitcase in the car. After surgery it may be brought to your room.                Please read over the following fact sheets you were given:MRSA INFORMATION SHEET;INCENTIVE SPIROMETRY;BLOOD TRANSFUSION FACTS SHEET  _____________________________________________________________________             [facility] - Preparing for Surgery Before surgery, you can play an important role.  Because skin is not sterile, your skin needs to be as free of germs as possible.  You can reduce the number of germs on your skin by washing with CHG (chlorahexidine gluconate) soap before surgery.  CHG is an antiseptic cleaner which kills germs and bonds with the skin to continue killing germs even after washing. Please DO NOT use if you have an allergy to CHG or antibacterial soaps.  If your skin becomes reddened/irritated stop using the CHG and inform your nurse when you arrive at Short  Stay. Do not shave (including legs and underarms) for at least 48 hours prior to the first CHG shower.  You may shave your face/neck. Please follow these instructions carefully:  1.  Shower with CHG Soap the night before surgery and the  morning of Surgery.  2.  If you choose to wash your hair, wash your hair first as usual with your  normal  shampoo.  3.  After you shampoo, rinse your hair and body thoroughly to remove the  shampoo.                           4.  Use CHG as you would any other liquid soap.  You can apply chg directly  to the skin and wash  Gently with a scrungie or clean washcloth.  5.  Apply the CHG Soap to your body ONLY FROM THE NECK DOWN.   Do not use on face/ open                           Wound or open sores. Avoid contact with eyes, ears mouth and genitals (private parts).                       Wash face,  Genitals (private parts) with your normal soap.             6.  Wash thoroughly, paying special attention to the area where your surgery  will be performed.  7.  Thoroughly rinse your body with warm water from the neck down.  8.  DO NOT shower/wash with your normal soap after using and rinsing off  the CHG Soap.                9.  Pat yourself dry with a clean towel.            10.  Wear clean pajamas.            11.  Place clean sheets on your bed the night of your first shower and do not  sleep with pets. Day of Surgery : Do not apply any lotions/deodorants the morning of surgery.  Please wear clean clothes to the hospital/surgery center.  FAILURE TO FOLLOW THESE INSTRUCTIONS MAY RESULT IN THE CANCELLATION OF YOUR SURGERY PATIENT SIGNATURE_________________________________  NURSE SIGNATURE__________________________________  ________________________________________________________________________    CLEAR LIQUID DIET   Foods Allowed                                                                     Foods Excluded  Coffee and tea,  regular and decaf                             liquids that you cannot  Plain Jell-O in any flavor                                             see through such as: Fruit ices (not with fruit pulp)                                     milk, soups, orange juice  Iced Popsicles                                    All solid food Carbonated beverages, regular and diet                                    Cranberry, grape and apple juices Sports drinks like Gatorade Lightly seasoned clear broth or consume(fat free) Sugar, honey syrup  Sample Menu Breakfast                                Lunch                                     Supper Cranberry juice                    Beef broth                            Chicken broth Jell-O                                     Grape juice                           Apple juice Coffee or tea                        Jell-O                                      Popsicle                                                Coffee or tea                        Coffee or tea  _____________________________________________________________________    Incentive Spirometer  An incentive spirometer is a tool that can help keep your lungs clear and active. This tool measures how well you are filling your lungs with each breath. Taking long deep breaths may help reverse or decrease the chance of developing breathing (pulmonary) problems (especially infection) following:  A long period of time when you are unable to move or be active. BEFORE THE PROCEDURE   If the spirometer includes an indicator to show your best effort, your nurse or respiratory therapist will set it to a desired goal.  If possible, sit up straight or lean slightly forward. Try not to slouch.  Hold the incentive spirometer in an upright position. INSTRUCTIONS FOR USE   Sit on the edge of your bed if possible, or sit up as far as you can in bed or on a chair.  Hold the incentive spirometer in an upright  position.  Breathe out normally.  Place the mouthpiece in your mouth and seal your lips tightly around it.  Breathe in slowly and as deeply as possible, raising the piston or the ball toward the top of the column.  Hold your breath for 3-5 seconds or for as long as possible. Allow the piston or ball to fall to the bottom of the column.  Remove the mouthpiece from your mouth and breathe out normally.  Rest for a few seconds and repeat Steps 1 through 7 at least 10 times every 1-2 hours when you are awake. Take your time and take a few normal breaths between  deep breaths.  The spirometer may include an indicator to show your best effort. Use the indicator as a goal to work toward during each repetition.  After each set of 10 deep breaths, practice coughing to be sure your lungs are clear. If you have an incision (the cut made at the time of surgery), support your incision when coughing by placing a pillow or rolled up towels firmly against it. Once you are able to get out of bed, walk around indoors and cough well. You may stop using the incentive spirometer when instructed by your caregiver.  RISKS AND COMPLICATIONS  Take your time so you do not get dizzy or light-headed.  If you are in pain, you may need to take or ask for pain medication before doing incentive spirometry. It is harder to take a deep breath if you are having pain. AFTER USE  Rest and breathe slowly and easily.  It can be helpful to keep track of a log of your progress. Your caregiver can provide you with a simple table to help with this. If you are using the spirometer at home, follow these instructions: [place] IF:   You are having difficultly using the spirometer.  You have trouble using the spirometer as often as instructed.  Your pain medication is not giving enough relief while using the spirometer.  You develop fever of 100.5 F (38.1 C) or higher. SEEK IMMEDIATE MEDICAL CARE IF:   You cough  up bloody sputum that had not been present before.  You develop fever of 102 F (38.9 C) or greater.  You develop worsening pain at or near the incision site. MAKE SURE YOU:   Understand these instructions.  Will watch your condition.  Will get help right away if you are not doing well or get worse. Document Released: 07/17/2006 Document Revised: 05/29/2011 Document Reviewed: 09/17/2006 [employer] Patient Information 2014 [employer], [place].   ________________________________________________________________________  WHAT IS A BLOOD TRANSFUSION? Blood Transfusion Information  A transfusion is the replacement of blood or some of its parts. Blood is made up of multiple cells which provide different functions.  Red blood cells carry oxygen and are used for blood loss replacement.  White blood cells fight against infection.  Platelets control bleeding.  Plasma helps clot blood.  Other blood products are available for specialized needs, such as hemophilia or other clotting disorders. BEFORE THE TRANSFUSION  Who gives blood for transfusions?   Healthy volunteers who are fully evaluated to make sure their blood is safe. This is blood bank blood. Transfusion therapy is the safest it has ever been in the practice of medicine. Before blood is taken from a donor, a complete history is taken to make sure that person has no history of diseases nor engages in risky social behavior (examples are intravenous drug use or sexual activity with multiple partners). The donor's travel history is screened to minimize risk of transmitting infections, such as malaria. The donated blood is tested for signs of infectious diseases, such as HIV and hepatitis. The blood is then tested to be sure it is compatible with you in order to minimize the chance of a transfusion reaction. If you or a relative donates blood, this is often done in anticipation of surgery and is not appropriate for emergency situations. It takes  many days to process the donated blood. RISKS AND COMPLICATIONS Although transfusion therapy is very safe and saves many lives, the main dangers of transfusion include:   Getting an infectious disease.  Developing a transfusion reaction. This is an allergic reaction to something in the blood you were given. Every precaution is taken to prevent this. The decision to have a blood transfusion has been considered carefully by your caregiver before blood is given. Blood is not given unless the benefits outweigh the risks. AFTER THE TRANSFUSION  Right after receiving a blood transfusion, you will usually feel much better and more energetic. This is especially true if your red blood cells have gotten low (anemic). The transfusion raises the level of the red blood cells which carry oxygen, and this usually causes an energy increase.  The nurse administering the transfusion will monitor you carefully for complications. HOME CARE INSTRUCTIONS  No special instructions are needed after a transfusion. You may find your energy is better. Speak with your caregiver about any limitations on activity for underlying diseases you may have. SEEK MEDICAL CARE IF:   Your condition is not improving after your transfusion.  You develop redness or irritation at the intravenous (IV) site. SEEK IMMEDIATE MEDICAL CARE IF:  Any of the following symptoms occur over the next 12 hours:  Shaking chills.  You have a temperature by mouth above 102 F (38.9 C), not controlled by medicine.  Chest, back, or muscle pain.  People around you feel you are not acting correctly or are confused.  Shortness of breath or difficulty breathing.  Dizziness and fainting.  You get a rash or develop hives.  You have a decrease in urine output.  Your urine turns a dark color or changes to pink, red, or brown. Any of the following symptoms occur over the next 10 days:  You have a temperature by mouth above 102 F (38.9 C), not  controlled by medicine.  Shortness of breath.  Weakness after normal activity.  The white part of the eye turns yellow (jaundice).  You have a decrease in the amount of urine or are urinating less often.  Your urine turns a dark color or changes to pink, red, or brown. Document Released: 03/03/2000 Document Revised: 05/29/2011 Document Reviewed: 10/21/2007 [employer] [facility] Patient Information 2014 [place], [place].  _______________________________________________________________________

## 2014-08-21 NOTE — Progress Notes (Signed)
BMP results in epic per PAT visit 08/21/2014 sent to Dr Alvan Dame

## 2014-08-21 NOTE — Progress Notes (Addendum)
Clearance note per chart per Dr Willey Blade 06/08/2014  ECHO epic 12/19/2007 CT abd/pelvis in epic - lungs mentioned 03/18/2014

## 2014-08-21 NOTE — Progress Notes (Signed)
Your patient has screened at an elevated risk for Obstructive Sleep Apnea using the Stop-Bang Tool during a pre-surgical vist. A score of 4 or greater is an elevated risk. Score of 4. 

## 2014-08-25 ENCOUNTER — Encounter (HOSPITAL_COMMUNITY)
Admission: RE | Disposition: A | Discharge status: Home Health | Payer: Self-pay | Source: Ambulatory Visit | Attending: Orthopedic Surgery

## 2014-08-25 ENCOUNTER — Inpatient Hospital Stay (HOSPITAL_COMMUNITY)
Admission: RE | Admit: 2014-08-25 | Discharge: 2014-08-26 | DRG: 470 | Disposition: A | Discharge status: Home Health | Payer: Medicare Other | Source: Ambulatory Visit | Attending: Orthopedic Surgery | Admitting: Orthopedic Surgery

## 2014-08-25 ENCOUNTER — Inpatient Hospital Stay (HOSPITAL_COMMUNITY): Payer: Medicare Other | Admitting: Anesthesiology

## 2014-08-25 ENCOUNTER — Encounter (HOSPITAL_COMMUNITY): Payer: Self-pay | Admitting: Anesthesiology

## 2014-08-25 DIAGNOSIS — Z01812 Encounter for preprocedural laboratory examination: Secondary | ICD-10-CM

## 2014-08-25 DIAGNOSIS — M659 Synovitis and tenosynovitis, unspecified: Secondary | ICD-10-CM | POA: Diagnosis present

## 2014-08-25 DIAGNOSIS — Z801 Family history of malignant neoplasm of trachea, bronchus and lung: Secondary | ICD-10-CM | POA: Diagnosis not present

## 2014-08-25 DIAGNOSIS — I10 Essential (primary) hypertension: Secondary | ICD-10-CM | POA: Diagnosis present

## 2014-08-25 DIAGNOSIS — Z96659 Presence of unspecified artificial knee joint: Secondary | ICD-10-CM

## 2014-08-25 DIAGNOSIS — M1712 Unilateral primary osteoarthritis, left knee: Principal | ICD-10-CM | POA: Diagnosis present

## 2014-08-25 DIAGNOSIS — Z8249 Family history of ischemic heart disease and other diseases of the circulatory system: Secondary | ICD-10-CM

## 2014-08-25 DIAGNOSIS — Z800 Family history of malignant neoplasm of digestive organs: Secondary | ICD-10-CM | POA: Diagnosis not present

## 2014-08-25 DIAGNOSIS — F329 Major depressive disorder, single episode, unspecified: Secondary | ICD-10-CM | POA: Diagnosis present

## 2014-08-25 DIAGNOSIS — Z87891 Personal history of nicotine dependence: Secondary | ICD-10-CM

## 2014-08-25 DIAGNOSIS — H409 Unspecified glaucoma: Secondary | ICD-10-CM | POA: Diagnosis present

## 2014-08-25 DIAGNOSIS — M25762 Osteophyte, left knee: Secondary | ICD-10-CM | POA: Diagnosis present

## 2014-08-25 DIAGNOSIS — M25562 Pain in left knee: Secondary | ICD-10-CM | POA: Diagnosis present

## 2014-08-25 DIAGNOSIS — E785 Hyperlipidemia, unspecified: Secondary | ICD-10-CM | POA: Diagnosis present

## 2014-08-25 DIAGNOSIS — E669 Obesity, unspecified: Secondary | ICD-10-CM | POA: Diagnosis present

## 2014-08-25 DIAGNOSIS — M25462 Effusion, left knee: Secondary | ICD-10-CM | POA: Diagnosis present

## 2014-08-25 DIAGNOSIS — Z886 Allergy status to analgesic agent status: Secondary | ICD-10-CM

## 2014-08-25 DIAGNOSIS — Z6839 Body mass index (BMI) 39.0-39.9, adult: Secondary | ICD-10-CM | POA: Diagnosis not present

## 2014-08-25 DIAGNOSIS — Z01810 Encounter for preprocedural cardiovascular examination: Secondary | ICD-10-CM | POA: Diagnosis not present

## 2014-08-25 DIAGNOSIS — Z96652 Presence of left artificial knee joint: Secondary | ICD-10-CM

## 2014-08-25 HISTORY — PX: TOTAL KNEE ARTHROPLASTY: SHX125

## 2014-08-25 LAB — TYPE AND SCREEN
ABO/RH(D): O POS
Antibody Screen: NEGATIVE

## 2014-08-25 LAB — ABO/RH: ABO/RH(D): O POS

## 2014-08-25 SURGERY — ARTHROPLASTY, KNEE, TOTAL
Anesthesia: Spinal | Site: Knee | Laterality: Left

## 2014-08-25 MED ORDER — ALUM & MAG HYDROXIDE-SIMETH 200-200-20 MG/5ML PO SUSP: 30.0000 mL | ORAL | Status: DC | PRN

## 2014-08-25 MED ORDER — METOCLOPRAMIDE HCL 10 MG PO TABS: 5.0000 mg | ORAL_TABLET | Freq: Three times a day (TID) | ORAL | Status: DC | PRN

## 2014-08-25 MED ORDER — MENTHOL 3 MG MT LOZG: 1.0000 | LOZENGE | OROMUCOSAL | Status: DC | PRN

## 2014-08-25 MED ORDER — CEFAZOLIN SODIUM-DEXTROSE 2-3 GM-% IV SOLR
2.0000 g | Freq: Four times a day (QID) | INTRAVENOUS | Status: AC
  Administered 2014-08-25 – 2014-08-26 (×2): 2 g via INTRAVENOUS
  Filled 2014-08-25 (×2): qty 50

## 2014-08-25 MED ORDER — ASPIRIN EC 325 MG PO TBEC
325.0000 mg | DELAYED_RELEASE_TABLET | Freq: Two times a day (BID) | ORAL | Status: DC
  Administered 2014-08-26: 325 mg via ORAL
  Filled 2014-08-25 (×4): qty 1

## 2014-08-25 MED ORDER — SODIUM CHLORIDE 0.9 % IV SOLN
INTRAVENOUS | Status: DC
  Administered 2014-08-25: 17:00:00 via INTRAVENOUS
  Filled 2014-08-25 (×8): qty 1000

## 2014-08-25 MED ORDER — FUROSEMIDE 40 MG PO TABS
60.0000 mg | ORAL_TABLET | Freq: Every morning | ORAL | Status: DC
  Administered 2014-08-26: 60 mg via ORAL
  Filled 2014-08-25: qty 1

## 2014-08-25 MED ORDER — BUPIVACAINE HCL (PF) 0.75 % IJ SOLN
INTRAMUSCULAR | Status: DC | PRN
  Administered 2014-08-25: 1.8 mL

## 2014-08-25 MED ORDER — METHOCARBAMOL 1000 MG/10ML IJ SOLN
500.0000 mg | Freq: Four times a day (QID) | INTRAVENOUS | Status: DC | PRN
  Filled 2014-08-25: qty 5

## 2014-08-25 MED ORDER — DEXAMETHASONE SODIUM PHOSPHATE 10 MG/ML IJ SOLN
10.0000 mg | Freq: Once | INTRAMUSCULAR | Status: DC
  Filled 2014-08-25: qty 1

## 2014-08-25 MED ORDER — PROPOFOL 10 MG/ML IV BOLUS
INTRAVENOUS | Status: AC
  Filled 2014-08-25: qty 20

## 2014-08-25 MED ORDER — MAGNESIUM CITRATE PO SOLN: 1.0000 | Freq: Once | ORAL | Status: AC | PRN

## 2014-08-25 MED ORDER — CHLORHEXIDINE GLUCONATE 4 % EX LIQD: 60.0000 mL | Freq: Once | CUTANEOUS | Status: DC

## 2014-08-25 MED ORDER — CEFAZOLIN SODIUM-DEXTROSE 2-3 GM-% IV SOLR
INTRAVENOUS | Status: AC
  Filled 2014-08-25: qty 50

## 2014-08-25 MED ORDER — SODIUM CHLORIDE 0.9 % IJ SOLN
INTRAMUSCULAR | Status: DC | PRN
  Administered 2014-08-25: 30 mL via INTRAVENOUS

## 2014-08-25 MED ORDER — MEPERIDINE HCL 50 MG/ML IJ SOLN: 6.2500 mg | INTRAMUSCULAR | Status: DC | PRN

## 2014-08-25 MED ORDER — LOSARTAN POTASSIUM 50 MG PO TABS
100.0000 mg | ORAL_TABLET | Freq: Every day | ORAL | Status: DC
  Administered 2014-08-26: 100 mg via ORAL
  Filled 2014-08-25: qty 2

## 2014-08-25 MED ORDER — FERROUS SULFATE 325 (65 FE) MG PO TABS
325.0000 mg | ORAL_TABLET | Freq: Three times a day (TID) | ORAL | Status: DC
  Administered 2014-08-26: 325 mg via ORAL
  Filled 2014-08-25 (×6): qty 1

## 2014-08-25 MED ORDER — LOSARTAN POTASSIUM-HCTZ 100-25 MG PO TABS: 1.0000 | ORAL_TABLET | Freq: Every morning | ORAL | Status: DC

## 2014-08-25 MED ORDER — HYDROMORPHONE HCL 1 MG/ML IJ SOLN
0.5000 mg | INTRAMUSCULAR | Status: DC | PRN
  Administered 2014-08-25: 1 mg via INTRAVENOUS
  Filled 2014-08-25: qty 1

## 2014-08-25 MED ORDER — ONDANSETRON HCL 4 MG/2ML IJ SOLN: 4.0000 mg | Freq: Four times a day (QID) | INTRAMUSCULAR | Status: DC | PRN

## 2014-08-25 MED ORDER — FENTANYL CITRATE (PF) 100 MCG/2ML IJ SOLN
INTRAMUSCULAR | Status: AC
  Filled 2014-08-25: qty 2

## 2014-08-25 MED ORDER — DIPHENHYDRAMINE HCL 25 MG PO CAPS: 25.0000 mg | ORAL_CAPSULE | Freq: Four times a day (QID) | ORAL | Status: DC | PRN

## 2014-08-25 MED ORDER — LACTATED RINGERS IV SOLN
INTRAVENOUS | Status: AC
  Administered 2014-08-25: 1000 mL via INTRAVENOUS
  Administered 2014-08-25: 15:00:00 via INTRAVENOUS

## 2014-08-25 MED ORDER — 0.9 % SODIUM CHLORIDE (POUR BTL) OPTIME
TOPICAL | Status: DC | PRN
  Administered 2014-08-25 (×2): 1000 mL

## 2014-08-25 MED ORDER — PROPOFOL INFUSION 10 MG/ML OPTIME
INTRAVENOUS | Status: DC | PRN
  Administered 2014-08-25: 75 ug/kg/min via INTRAVENOUS

## 2014-08-25 MED ORDER — METHOCARBAMOL 500 MG PO TABS
500.0000 mg | ORAL_TABLET | Freq: Four times a day (QID) | ORAL | Status: DC | PRN
  Administered 2014-08-26: 500 mg via ORAL
  Filled 2014-08-25: qty 1

## 2014-08-25 MED ORDER — PHENOL 1.4 % MT LIQD
1.0000 | OROMUCOSAL | Status: DC | PRN
  Filled 2014-08-25: qty 177

## 2014-08-25 MED ORDER — SIMVASTATIN 10 MG PO TABS
10.0000 mg | ORAL_TABLET | Freq: Every day | ORAL | Status: DC
  Administered 2014-08-25: 10 mg via ORAL
  Filled 2014-08-25 (×3): qty 1

## 2014-08-25 MED ORDER — DOCUSATE SODIUM 100 MG PO CAPS
100.0000 mg | ORAL_CAPSULE | Freq: Two times a day (BID) | ORAL | Status: DC
  Administered 2014-08-25 – 2014-08-26 (×2): 100 mg via ORAL

## 2014-08-25 MED ORDER — SODIUM CHLORIDE 0.9 % IJ SOLN
INTRAMUSCULAR | Status: AC
  Filled 2014-08-25: qty 50

## 2014-08-25 MED ORDER — DEXAMETHASONE SODIUM PHOSPHATE 10 MG/ML IJ SOLN
10.0000 mg | Freq: Once | INTRAMUSCULAR | Status: AC
  Administered 2014-08-25: 10 mg via INTRAVENOUS

## 2014-08-25 MED ORDER — POLYETHYLENE GLYCOL 3350 17 G PO PACK
17.0000 g | PACK | Freq: Two times a day (BID) | ORAL | Status: DC
  Administered 2014-08-25 – 2014-08-26 (×2): 17 g via ORAL

## 2014-08-25 MED ORDER — FENTANYL CITRATE (PF) 100 MCG/2ML IJ SOLN
INTRAMUSCULAR | Status: DC | PRN
  Administered 2014-08-25 (×2): 50 ug via INTRAVENOUS

## 2014-08-25 MED ORDER — CELECOXIB 200 MG PO CAPS
200.0000 mg | ORAL_CAPSULE | Freq: Two times a day (BID) | ORAL | Status: DC
  Administered 2014-08-25 – 2014-08-26 (×2): 200 mg via ORAL
  Filled 2014-08-25 (×3): qty 1

## 2014-08-25 MED ORDER — DEXTROSE 5 % IV SOLN
10.0000 mg | INTRAVENOUS | Status: DC | PRN
  Administered 2014-08-25: 40 ug/min via INTRAVENOUS

## 2014-08-25 MED ORDER — CEFAZOLIN SODIUM-DEXTROSE 2-3 GM-% IV SOLR
2.0000 g | INTRAVENOUS | Status: AC
  Administered 2014-08-25: 2 g via INTRAVENOUS

## 2014-08-25 MED ORDER — LIDOCAINE HCL (CARDIAC) 20 MG/ML IV SOLN
INTRAVENOUS | Status: AC
  Filled 2014-08-25: qty 5

## 2014-08-25 MED ORDER — CITALOPRAM HYDROBROMIDE 20 MG PO TABS
20.0000 mg | ORAL_TABLET | Freq: Every morning | ORAL | Status: DC
  Administered 2014-08-26: 20 mg via ORAL
  Filled 2014-08-25: qty 1

## 2014-08-25 MED ORDER — HYDROMORPHONE HCL 1 MG/ML IJ SOLN: 0.2500 mg | INTRAMUSCULAR | Status: DC | PRN

## 2014-08-25 MED ORDER — TRANEXAMIC ACID 1000 MG/10ML IV SOLN
1000.0000 mg | Freq: Once | INTRAVENOUS | Status: AC
  Administered 2014-08-25: 1000 mg via INTRAVENOUS
  Filled 2014-08-25: qty 10

## 2014-08-25 MED ORDER — ONDANSETRON HCL 4 MG/2ML IJ SOLN: 4.0000 mg | Freq: Once | INTRAMUSCULAR | Status: DC | PRN

## 2014-08-25 MED ORDER — PROPOFOL 10 MG/ML IV BOLUS
INTRAVENOUS | Status: DC | PRN
  Administered 2014-08-25: 30 mg via INTRAVENOUS
  Administered 2014-08-25 (×2): 20 mg via INTRAVENOUS

## 2014-08-25 MED ORDER — ONDANSETRON HCL 4 MG PO TABS: 4.0000 mg | ORAL_TABLET | Freq: Four times a day (QID) | ORAL | Status: DC | PRN

## 2014-08-25 MED ORDER — ONDANSETRON HCL 4 MG/2ML IJ SOLN
INTRAMUSCULAR | Status: AC
  Filled 2014-08-25: qty 2

## 2014-08-25 MED ORDER — HYDROCHLOROTHIAZIDE 25 MG PO TABS
25.0000 mg | ORAL_TABLET | Freq: Every day | ORAL | Status: DC
  Administered 2014-08-26: 25 mg via ORAL
  Filled 2014-08-25: qty 1

## 2014-08-25 MED ORDER — BISACODYL 10 MG RE SUPP: 10.0000 mg | Freq: Every day | RECTAL | Status: DC | PRN

## 2014-08-25 MED ORDER — BUPIVACAINE-EPINEPHRINE (PF) 0.25% -1:200000 IJ SOLN
INTRAMUSCULAR | Status: DC | PRN
  Administered 2014-08-25: 30 mL via PERINEURAL

## 2014-08-25 MED ORDER — HYDROCODONE-ACETAMINOPHEN 7.5-325 MG PO TABS
1.0000 | ORAL_TABLET | ORAL | Status: DC
  Administered 2014-08-25 (×2): 2 via ORAL
  Administered 2014-08-26: 1 via ORAL
  Administered 2014-08-26: 2 via ORAL
  Administered 2014-08-26: 1 via ORAL
  Administered 2014-08-26: 2 via ORAL
  Filled 2014-08-25 (×3): qty 2
  Filled 2014-08-25: qty 1
  Filled 2014-08-25 (×2): qty 2

## 2014-08-25 MED ORDER — DEXAMETHASONE SODIUM PHOSPHATE 10 MG/ML IJ SOLN
INTRAMUSCULAR | Status: AC
  Filled 2014-08-25: qty 1

## 2014-08-25 MED ORDER — METOCLOPRAMIDE HCL 5 MG/ML IJ SOLN: 5.0000 mg | Freq: Three times a day (TID) | INTRAMUSCULAR | Status: DC | PRN

## 2014-08-25 MED ORDER — LATANOPROST 0.005 % OP SOLN
1.0000 [drp] | Freq: Every day | OPHTHALMIC | Status: DC
  Administered 2014-08-25: 1 [drp] via OPHTHALMIC
  Filled 2014-08-25: qty 2.5

## 2014-08-25 MED ORDER — BUPIVACAINE-EPINEPHRINE (PF) 0.25% -1:200000 IJ SOLN
INTRAMUSCULAR | Status: AC
  Filled 2014-08-25: qty 30

## 2014-08-25 SURGICAL SUPPLY — 58 items
BAG DECANTER FOR FLEXI CONT (MISCELLANEOUS) IMPLANT
BAG SPEC THK2 15X12 ZIP CLS (MISCELLANEOUS)
BAG ZIPLOCK 12X15 (MISCELLANEOUS) IMPLANT
BANDAGE ELASTIC 6 VELCRO ST LF (GAUZE/BANDAGES/DRESSINGS) ×3 IMPLANT
BANDAGE ESMARK 6X9 LF (GAUZE/BANDAGES/DRESSINGS) ×1 IMPLANT
BLADE SAW SGTL 13.0X1.19X90.0M (BLADE) ×3 IMPLANT
BNDG ADH 5X4 AIR PERM ELC (GAUZE/BANDAGES/DRESSINGS) ×1
BNDG CMPR 9X6 STRL LF SNTH (GAUZE/BANDAGES/DRESSINGS) ×1
BNDG CMPR MED 10X6 ELC LF (GAUZE/BANDAGES/DRESSINGS) ×1
BNDG COHESIVE 4X5 WHT NS (GAUZE/BANDAGES/DRESSINGS) ×3 IMPLANT
BNDG ELASTIC 6X10 VLCR STRL LF (GAUZE/BANDAGES/DRESSINGS) ×3 IMPLANT
BNDG ESMARK 6X9 LF (GAUZE/BANDAGES/DRESSINGS) ×3
BOWL SMART MIX CTS (DISPOSABLE) ×3 IMPLANT
CAPT KNEE TOTAL 3 ATTUNE ×3 IMPLANT
CEMENT HV SMART SET (Cement) ×6 IMPLANT
CUFF TOURN SGL QUICK 34 (TOURNIQUET CUFF) ×3
CUFF TRNQT CYL 34X4X40X1 (TOURNIQUET CUFF) ×1 IMPLANT
DECANTER SPIKE VIAL GLASS SM (MISCELLANEOUS) ×3 IMPLANT
DRAPE EXTREMITY T 121X128X90 (DRAPE) ×3 IMPLANT
DRAPE POUCH INSTRU U-SHP 10X18 (DRAPES) ×3 IMPLANT
DRAPE U-SHAPE 47X51 STRL (DRAPES) ×3 IMPLANT
DRSG AQUACEL AG ADV 3.5X10 (GAUZE/BANDAGES/DRESSINGS) ×3 IMPLANT
DURAPREP 26ML APPLICATOR (WOUND CARE) ×6 IMPLANT
ELECT REM PT RETURN 9FT ADLT (ELECTROSURGICAL) ×3
ELECTRODE REM PT RTRN 9FT ADLT (ELECTROSURGICAL) ×1 IMPLANT
FACESHIELD WRAPAROUND (MASK) ×15 IMPLANT
GLOVE BIOGEL PI IND STRL 7.5 (GLOVE) ×1 IMPLANT
GLOVE BIOGEL PI IND STRL 8.5 (GLOVE) ×1 IMPLANT
GLOVE BIOGEL PI INDICATOR 7.5 (GLOVE) ×2
GLOVE BIOGEL PI INDICATOR 8.5 (GLOVE) ×2
GLOVE ECLIPSE 8.0 STRL XLNG CF (GLOVE) ×3 IMPLANT
GLOVE ORTHO TXT STRL SZ7.5 (GLOVE) ×6 IMPLANT
GOWN SPEC L3 XXLG W/TWL (GOWN DISPOSABLE) ×3 IMPLANT
GOWN STRL REUS W/TWL LRG LVL3 (GOWN DISPOSABLE) ×3 IMPLANT
HANDPIECE INTERPULSE COAX TIP (DISPOSABLE) ×3
KIT BASIN OR (CUSTOM PROCEDURE TRAY) ×3 IMPLANT
LIQUID BAND (GAUZE/BANDAGES/DRESSINGS) ×3 IMPLANT
MANIFOLD NEPTUNE II (INSTRUMENTS) ×3 IMPLANT
NDL SAFETY ECLIPSE 18X1.5 (NEEDLE) ×1 IMPLANT
NEEDLE HYPO 18GX1.5 SHARP (NEEDLE) ×2
PACK TOTAL JOINT (CUSTOM PROCEDURE TRAY) ×3 IMPLANT
PEN SKIN MARKING BROAD (MISCELLANEOUS) ×3 IMPLANT
POSITIONER SURGICAL ARM (MISCELLANEOUS) ×3 IMPLANT
SET HNDPC FAN SPRY TIP SCT (DISPOSABLE) ×1 IMPLANT
SET PAD KNEE POSITIONER (MISCELLANEOUS) ×3 IMPLANT
SUCTION FRAZIER 12FR DISP (SUCTIONS) ×3 IMPLANT
SUT MNCRL AB 4-0 PS2 18 (SUTURE) ×3 IMPLANT
SUT VIC AB 1 CT1 36 (SUTURE) ×3 IMPLANT
SUT VIC AB 2-0 CT1 27 (SUTURE) ×6
SUT VIC AB 2-0 CT1 TAPERPNT 27 (SUTURE) ×3 IMPLANT
SUT VLOC 180 0 24IN GS25 (SUTURE) ×3 IMPLANT
SYR 50ML LL SCALE MARK (SYRINGE) ×3 IMPLANT
TOWEL OR 17X26 10 PK STRL BLUE (TOWEL DISPOSABLE) ×3 IMPLANT
TOWEL OR NON WOVEN STRL DISP B (DISPOSABLE) IMPLANT
TRAY FOLEY W/METER SILVER 14FR (SET/KITS/TRAYS/PACK) ×3 IMPLANT
WATER STERILE IRR 1500ML POUR (IV SOLUTION) ×3 IMPLANT
WRAP KNEE MAXI GEL POST OP (GAUZE/BANDAGES/DRESSINGS) ×3 IMPLANT
YANKAUER SUCT BULB TIP 10FT TU (MISCELLANEOUS) ×3 IMPLANT

## 2014-08-25 NOTE — Transfer of Care (Signed)
Immediate Anesthesia Transfer of Care Note  Patient: Kim Dixon  Procedure(s) Performed: Procedure(s): LEFT TOTAL KNEE ARTHROPLASTY (Left)  Patient Location: PACU  Anesthesia Type:Spinal  Level of Consciousness:  sedated, patient cooperative and responds to stimulation  Airway & Oxygen Therapy:Patient Spontanous Breathing and Patient connected to face mask oxgen  Post-op Assessment:  Report given to PACU RN and Post -op Vital signs reviewed and stable  Post vital signs:  Reviewed and stable  Last Vitals: There were no vitals filed for this visit.  Complications: No apparent anesthesia complications L 4 level on exam denied pain on assessment.

## 2014-08-25 NOTE — Anesthesia Procedure Notes (Signed)
Spinal Patient location during procedure: OR Start time: 08/25/2014 2:10 PM End time: 08/25/2014 2:15 PM Staffing Anesthesiologist: Lillia Abed Performed by: anesthesiologist  Preanesthetic Checklist Completed: patient identified, site marked, surgical consent, pre-op evaluation, timeout performed, IV checked, risks and benefits discussed and monitors and equipment checked Spinal Block Patient position: sitting Prep: Betadine Patient monitoring: heart rate, cardiac monitor, continuous pulse ox and blood pressure Approach: right paramedian Location: L3-4 Injection technique: single-shot Needle Needle type: Spinocan  Needle gauge: 22 G Needle length: 9 cm Needle insertion depth: 7 cm Assessment Sensory level: T8

## 2014-08-25 NOTE — Anesthesia Preprocedure Evaluation (Signed)
Anesthesia Evaluation  Patient identified by MRN, date of birth, ID band Patient awake    Reviewed: Allergy & Precautions, Patient's Chart, lab work & pertinent test results  Airway Mallampati: I  TM Distance: >3 FB Neck ROM: Full    Dental   Pulmonary former smoker,    Pulmonary exam normal       Cardiovascular hypertension, Pt. on medications Normal cardiovascular exam    Neuro/Psych    GI/Hepatic   Endo/Other    Renal/GU      Musculoskeletal   Abdominal   Peds  Hematology   Anesthesia Other Findings   Reproductive/Obstetrics                             Anesthesia Physical Anesthesia Plan  ASA: II  Anesthesia Plan: Spinal   Post-op Pain Management:    Induction: Intravenous  Airway Management Planned: Natural Airway  Additional Equipment:   Intra-op Plan:   Post-operative Plan:   Informed Consent: I have reviewed the patients History and Physical, chart, labs and discussed the procedure including the risks, benefits and alternatives for the proposed anesthesia with the patient or authorized representative who has indicated his/her understanding and acceptance.     Plan Discussed with: CRNA and Surgeon  Anesthesia Plan Comments:         Anesthesia Quick Evaluation

## 2014-08-25 NOTE — Interval H&P Note (Signed)
History and Physical Interval Note:  08/25/2014 1:00 PM  Kim Dixon  has presented today for surgery, with the diagnosis of LEFT KNEE OA  The various methods of treatment have been discussed with the patient and family. After consideration of risks, benefits and other options for treatment, the patient has consented to  Procedure(s): LEFT TOTAL KNEE ARTHROPLASTY (Left) as a surgical intervention .  The patient's history has been reviewed, patient examined, no change in status, stable for surgery.  I have reviewed the patient's chart and labs.  Questions were answered to the patient's satisfaction.     Mauri Pole

## 2014-08-25 NOTE — Anesthesia Postprocedure Evaluation (Signed)
Anesthesia Post Note  Patient: Kim Dixon  Procedure(s) Performed: Procedure(s) (LRB): LEFT TOTAL KNEE ARTHROPLASTY (Left)  Anesthesia type: spinal  Patient location: PACU  Post pain: Pain level controlled  Post assessment: Patient's Cardiovascular Status Stable  Last Vitals:  Filed Vitals:   08/25/14 1805  BP: 142/50  Pulse: 63  Temp: 36.7 C  Resp: 16    Post vital signs: Reviewed and stable  Level of consciousness: awake  Complications: No apparent anesthesia complications

## 2014-08-26 ENCOUNTER — Encounter (HOSPITAL_COMMUNITY): Payer: Self-pay | Admitting: Orthopedic Surgery

## 2014-08-26 DIAGNOSIS — E669 Obesity, unspecified: Secondary | ICD-10-CM | POA: Diagnosis present

## 2014-08-26 LAB — BASIC METABOLIC PANEL
Anion gap: 9 (ref 5–15)
BUN: 19 mg/dL (ref 6–20)
CO2: 26 mmol/L (ref 22–32)
Calcium: 9.5 mg/dL (ref 8.9–10.3)
Chloride: 102 mmol/L (ref 101–111)
Creatinine, Ser: 0.63 mg/dL (ref 0.44–1.00)
GFR calc Af Amer: >60 mL/min (ref >60)
GFR calc non Af Amer: >60 mL/min (ref >60)
Glucose, Bld: 186 mg/dL — ABNORMAL HIGH (ref 65–99)
Potassium: 4.1 mmol/L (ref 3.5–5.1)
Sodium: 137 mmol/L (ref 135–145)

## 2014-08-26 LAB — CBC
HCT: 37.4 % (ref 36.0–46.0)
HEMOGLOBIN: 12.4 g/dL (ref 12.0–15.0)
MCH: 32 pg (ref 26.0–34.0)
MCHC: 33.2 g/dL (ref 30.0–36.0)
MCV: 96.4 fL (ref 78.0–100.0)
Platelets: 139 10*3/uL — ABNORMAL LOW (ref 150–400)
RBC: 3.88 MIL/uL (ref 3.87–5.11)
RDW: 13.2 % (ref 11.5–15.5)
WBC: 11.2 10*3/uL — ABNORMAL HIGH (ref 4.0–10.5)

## 2014-08-26 MED ORDER — FERROUS SULFATE 325 (65 FE) MG PO TABS: 325.0000 mg | ORAL_TABLET | Freq: Three times a day (TID) | ORAL | Status: DC

## 2014-08-26 MED ORDER — POLYETHYLENE GLYCOL 3350 17 G PO PACK: 17.0000 g | PACK | Freq: Two times a day (BID) | ORAL | Status: DC

## 2014-08-26 MED ORDER — ASPIRIN 325 MG PO TBEC: 325.0000 mg | DELAYED_RELEASE_TABLET | Freq: Two times a day (BID) | ORAL | Status: AC

## 2014-08-26 MED ORDER — DOCUSATE SODIUM 100 MG PO CAPS: 100.0000 mg | ORAL_CAPSULE | Freq: Two times a day (BID) | ORAL | Status: DC

## 2014-08-26 MED ORDER — TIZANIDINE HCL 4 MG PO TABS: 4.0000 mg | ORAL_TABLET | Freq: Four times a day (QID) | ORAL | Status: DC | PRN

## 2014-08-26 MED ORDER — HYDROCODONE-ACETAMINOPHEN 7.5-325 MG PO TABS: 1.0000 | ORAL_TABLET | ORAL | Status: DC | PRN

## 2014-08-26 NOTE — Evaluation (Signed)
Occupational Therapy Evaluation Patient Details Name: Kim Dixon MRN: [id number] DOB: February 13, 1941 Today's Date: 08/26/2014    History of Present Illness L TKR   Clinical Impression   This 74 year old female was admitted for the above surgery.  All education was completed.  No further OT is needed at this time.    Follow Up Recommendations  Supervision/Assistance - 24 hour;No OT follow up    Equipment Recommendations  3 in 1 bedside comode (pt may want to try her standard commode before ordering; would benefit from this)    Recommendations for Other Services       Precautions / Restrictions Precautions Precautions: Fall;Knee Restrictions Weight Bearing Restrictions: No Other Position/Activity Restrictions: WBAT      Mobility Bed Mobility Overal bed mobility: Needs Assistance Bed Mobility: Supine to Sit     Supine to sit: Min guard     General bed mobility comments: OOB   Transfers Overall transfer level: Needs assistance Equipment used: Rolling walker (2 wheeled) Transfers: Sit to/from Stand Sit to Stand: Min assist         General transfer comment: cues for LLE placement    Balance                                            ADL Overall ADL's : Needs assistance/impaired     Grooming: Oral care;Supervision/safety;Standing                   Toilet Transfer: Minimal assistance;Ambulation;Comfort height toilet;Grab bars   Toileting- Clothing Manipulation and Hygiene: Min guard;Sit to/from stand         General ADL Comments: pt is able to complete UB adls with set up; min A for LB adls.  Daughter will assist as needed.  Pt has a tub:  discussed readiness vs placing 3:1 sideways and keeping legs out vs sponge bathing.  Pt is not sure she wants a 3:1--not sure it will fit.     Vision     Perception     Praxis      Pertinent Vitals/Pain Pain Assessment: 0-10 Pain Score: 6  Pain Location: L knee Pain Descriptors  / Indicators: Aching Pain Intervention(s): Limited activity within patient's tolerance     Hand Dominance Right   Extremity/Trunk Assessment Upper Extremity Assessment Upper Extremity Assessment: RUE deficits/detail RUE Deficits / Details: WFLs for function but has Rotator Cuff problem          Communication Communication Communication: No difficulties   Cognition Arousal/Alertness: Awake/alert Behavior During Therapy: WFL for tasks assessed/performed Overall Cognitive Status: Within Functional Limits for tasks assessed                     General Comments       Exercises      Shoulder Instructions      Home Living Family/patient expects to be discharged to:: Private residence Living Arrangements: Alone Available Help at Discharge: Family Type of Home: House Home Access: Stairs to enter Technical brewer of Steps: 4 Entrance Stairs-Rails: Left Home Layout: One level;Able to live on main level with bedroom/bathroom     Bathroom Shower/Tub: Tub/shower unit;Curtain   Bathroom Toilet: Standard     Home Equipment: Environmental consultant - 2 wheels   Additional Comments: daughter will stay with her       Prior Functioning/Environment Level of Independence: Independent  OT Diagnosis: Generalized weakness   OT Problem List:     OT Treatment/Interventions:      OT Goals(Current goals can be found in the care plan section) Acute Rehab OT Goals Patient Stated Goal: return to being independent  OT Frequency:     Barriers to D/C:            Co-evaluation              End of Session    Activity Tolerance: Patient tolerated treatment well Patient left: in chair;with call bell/phone within reach   Time: 0832-0853 OT Time Calculation (min): 21 min Charges:  OT General Charges $OT Visit: 1 Procedure OT Evaluation $Initial OT Evaluation Tier I: 1 Procedure G-Codes:    Ivie Maese 08-27-14, 9:07 AM   Lesle Chris,  OTR/L [phone] 27-Aug-2014

## 2014-08-26 NOTE — Care Management Note (Signed)
Case Management Note  Patient Details  Name: Kim Dixon MRN: [id number] Date of Birth: [birth year]  Subjective/Objective:                   LEFT TOTAL KNEE ARTHROPLASTY (Left)  Action/Plan: Discharge planning  Expected Discharge Date:  08/26/14               Expected Discharge Plan:  [place]  In-House Referral:     Discharge planning Services  CM Consult  Post Acute Care Choice:  Home Health Choice offered to:     DME Arranged:  3-N-1 DME Agency:  [place]:  PT [facility] Agency:  [place] Agency  Status of Service:  Completed, signed off  Medicare Important Message Given:    Date Medicare IM Given:    Medicare IM give by:    Date Additional Medicare IM Given:    Additional Medicare Important Message give by:     If discussed at [place] of Stay Meetings, dates discussed:    Additional Comments: CM met with pt in room to offer choice of home health agency.  Pt chooses [facility] to render HHPT.  Address and contact information verified by pt.  Referral emailed to [employer], Tim.  CM called [facility] DME rep, Lecretia to please deliver the 3n1 to room prior to discharge today.  No other CM needs were communciated. Dellie Catholic, RN 08/26/2014, 10:42 AM

## 2014-08-26 NOTE — Progress Notes (Signed)
Physical Therapy Treatment Patient Details Name: Kim Dixon MRN: [id number] DOB: 11-12-1940 Today's Date: 08/26/2014    History of Present Illness L TKR    PT Comments    Pt progressing with mobility but ltd this pm by nausea.  Reviewed stairs and therex program.  Follow Up Recommendations  Home health PT     Equipment Recommendations  None recommended by PT    Recommendations for Other Services OT consult     Precautions / Restrictions Precautions Precautions: Fall;Knee Restrictions Weight Bearing Restrictions: No Other Position/Activity Restrictions: WBAT    Mobility  Bed Mobility Overal bed mobility: Needs Assistance Bed Mobility: Sit to Supine       Sit to supine: Supervision      Transfers Overall transfer level: Needs assistance Equipment used: Rolling walker (2 wheeled) Transfers: Sit to/from Stand Sit to Stand: Min guard         General transfer comment: cues for LLE placement and use of hands to self assist.    Ambulation/Gait Ambulation/Gait assistance: Min guard Ambulation Distance (Feet): 30 Feet (and 5) Assistive device: Rolling walker (2 wheeled) Gait Pattern/deviations: Step-to pattern;Decreased step length - right;Decreased step length - left;Shuffle;Trunk flexed Gait velocity: decr   General Gait Details: cues for sequence, posture and position from RW.  Ltd by onset nausea   Stairs Stairs: Yes Stairs assistance: Min assist Stair Management: One rail Right;Step to pattern;Forwards;With cane Number of Stairs: 2 General stair comments: cues for sequence and foot/QC placement.  Second attempt deferred 2* nausea  Wheelchair Mobility    Modified Rankin (Stroke Patients Only)       Balance                                    Cognition Arousal/Alertness: Awake/alert Behavior During Therapy: WFL for tasks assessed/performed Overall Cognitive Status: Within Functional Limits for tasks assessed                       Exercises Total Joint Exercises Ankle Circles/Pumps: AROM;Both;15 reps;Supine Quad Sets: AROM;Both;10 reps;Supine Heel Slides: AAROM;15 reps;Left;Supine Straight Leg Raises: AAROM;AROM;Left;15 reps;Supine    General Comments        Pertinent Vitals/Pain Pain Assessment: 0-10 Pain Score: 5  Pain Location: L knee Pain Descriptors / Indicators: Aching;Sore Pain Intervention(s): Limited activity within patient's tolerance;Monitored during session;Premedicated before session;Ice applied    Home Living                      Prior Function            PT Goals (current goals can now be found in the care plan section) Acute Rehab PT Goals Patient Stated Goal: return to being independent PT Goal Formulation: With patient Time For Goal Achievement: 09/02/14 Potential to Achieve Goals: Good Progress towards PT goals: Progressing toward goals    Frequency  7X/week    PT Plan Current plan remains appropriate    Co-evaluation             End of Session Equipment Utilized During Treatment: Gait belt Activity Tolerance: Patient tolerated treatment well Patient left: in bed;with call bell/phone within reach     Time: 1351-1422 PT Time Calculation (min) (ACUTE ONLY): 31 min  Charges:  $Gait Training: 8-22 mins $Therapeutic Exercise: 8-22 mins  G Codes:      Hawkin Charo Sep 05, 2014, 5:20 PM

## 2014-08-26 NOTE — Progress Notes (Signed)
     Subjective: 1 Day Post-Op Procedure(s) (LRB): LEFT TOTAL KNEE ARTHROPLASTY (Left)   Patient reports pain as mild, pain controlled. No events throughout the night. Ready to be discharged home if she does well with PT and pain stays controlled.   Objective:   VITALS:   Filed Vitals:   08/26/14 0612  BP: 145/66  Pulse: 57  Temp: 98.3 F (36.8 C)  Resp: 16    Dorsiflexion/Plantar flexion intact Incision: dressing C/D/I No cellulitis present Compartment soft  LABS  Recent Labs  08/26/14 0453  HGB 12.4  HCT 37.4  WBC 11.2*  PLT 139*     Recent Labs  08/26/14 0453  NA 137  K 4.1  BUN 19  CREATININE 0.63  GLUCOSE 186*     Assessment/Plan: 1 Day Post-Op Procedure(s) (LRB): LEFT TOTAL KNEE ARTHROPLASTY (Left) Foley cath d/c'ed Advance diet Up with therapy D/C IV fluids Discharge home with home health  Follow up in 2 weeks at [facility]. Follow up with OLIN,Elpidio Thielen D in 2 weeks.  Contact information:  [facility] [address], [place] [phone]    Obese (BMI 30-39.9) Estimated body mass index is 39.8 kg/(m^2) as calculated from the following:   Height as of this encounter: 5\' 4"  (1.626 m).   Weight as of this encounter: 105.235 kg (232 lb). Patient also counseled that weight may inhibit the healing process Patient counseled that losing weight will help with future health issues        [place] Pugh. Kim Dixon   PAC  08/26/2014, 9:10 AM

## 2014-08-26 NOTE — Progress Notes (Signed)
Physical Therapy Evaluation Patient Details Name: Kim Dixon MRN: [id number] DOB: 04/20/1940 Today's Date: 08/26/2014   History of Present Illness  L TKR  Clinical Impression  Pt s/p L TKR presents with decreased L LE strength/ROM and post op pain limiting functional mobility.  Pt should progress to dc home with family assist and HHPT follow up.    Follow Up Recommendations Home health PT    Equipment Recommendations  None recommended by PT    Recommendations for Other Services OT consult     Precautions / Restrictions Precautions Precautions: Fall;Knee Restrictions Weight Bearing Restrictions: No Other Position/Activity Restrictions: WBAT      Mobility  Bed Mobility Overal bed mobility: Needs Assistance Bed Mobility: Supine to Sit     Supine to sit: Min guard     General bed mobility comments: OOB   Transfers Overall transfer level: Needs assistance Equipment used: Rolling walker (2 wheeled) Transfers: Sit to/from Stand Sit to Stand: Min assist         General transfer comment: cues for LLE placement  Ambulation/Gait Ambulation/Gait assistance: Min assist Ambulation Distance (Feet): 29 Feet Assistive device: Rolling walker (2 wheeled) Gait Pattern/deviations: Step-to pattern;Decreased step length - right;Decreased step length - left;Shuffle;Trunk flexed     General Gait Details: cues for sequence, posture and position from [employer]            Wheelchair Mobility    Modified Rankin (Stroke Patients Only)       Balance                                             Pertinent Vitals/Pain Pain Assessment: 0-10 Pain Score: 6  Pain Location: L knee Pain Descriptors / Indicators: Aching Pain Intervention(s): Limited activity within patient's tolerance    Home Living Family/patient expects to be discharged to:: Private residence Living Arrangements: Alone Available Help at Discharge: Family Type of Home:  House Home Access: Stairs to enter Entrance Stairs-Rails: Left Entrance Stairs-Number of Steps: 4 Home Layout: One level;Able to live on main level with bedroom/bathroom Home Equipment: Gilford Rile - 2 wheels Additional Comments: daughter will stay with her     Prior Function Level of Independence: Independent               Hand Dominance   Dominant Hand: Right    Extremity/Trunk Assessment   Upper Extremity Assessment: Overall WFL for tasks assessed (Simultaneous filing. User may not have seen previous data.) RUE Deficits / Details: WFLs for function but has Rotator Cuff problem         Lower Extremity Assessment: LLE deficits/detail   LLE Deficits / Details: 3/5 quads with AAROM at knee -10 - 75     Communication   Communication: No difficulties  Cognition Arousal/Alertness: Awake/alert Behavior During Therapy: WFL for tasks assessed/performed Overall Cognitive Status: Within Functional Limits for tasks assessed                      General Comments      Exercises Total Joint Exercises Ankle Circles/Pumps: AROM;Both;15 reps;Supine Quad Sets: AROM;Both;10 reps;Supine Heel Slides: AAROM;15 reps;Left;Supine Straight Leg Raises: AAROM;AROM;Left;15 reps;Supine      Assessment/Plan    PT Assessment Patient needs continued PT services  PT Diagnosis Difficulty walking   PT Problem List Decreased strength;Decreased range of motion;Decreased activity tolerance;Decreased mobility;Decreased knowledge of use  of DME;Pain  PT Treatment Interventions DME instruction;Gait training;Stair training;Functional mobility training;Therapeutic activities;Therapeutic exercise;Patient/family education   PT Goals (Current goals can be found in the Care Plan section) Acute Rehab PT Goals Patient Stated Goal: return to being independent PT Goal Formulation: With patient Time For Goal Achievement: 09/02/14 Potential to Achieve Goals: Good    Frequency 7X/week   Barriers to  discharge        Co-evaluation               End of Session Equipment Utilized During Treatment: Gait belt Activity Tolerance: Patient tolerated treatment well Patient left: in chair;with call bell/phone within reach Nurse Communication: Mobility status         Time: 1610-9604 PT Time Calculation (min) (ACUTE ONLY): 27 min   Charges:   PT Evaluation $Initial PT Evaluation Tier I: 1 Procedure PT Treatments $Therapeutic Exercise: 8-22 mins   PT G Codes:        Kim Dixon Sep 19, 2014, 9:08 AM

## 2014-08-26 NOTE — Discharge Instructions (Signed)

## 2014-08-27 NOTE — Op Note (Signed)
NAME:  Kim Dixon                      MEDICAL RECORD NO.:  [id number]                             FACILITY:  [facility]      PHYSICIAN:  Pietro Cassis. Alvan Dame, M.D.  DATE OF BIRTH:  [birth year]      DATE OF PROCEDURE:  08/27/2014                                     OPERATIVE REPORT         PREOPERATIVE DIAGNOSIS:  Left knee osteoarthritis.      POSTOPERATIVE DIAGNOSIS:  Left knee osteoarthritis.      FINDINGS:  The patient was noted to have complete loss of cartilage and   bone-on-bone arthritis with associated osteophytes in the all three compartments of   the knee with a significant synovitis and associated effusion.      PROCEDURE:  Left total knee replacement.      COMPONENTS USED:  DePuy Attune rotating platform posterior stabilized knee   system, a size 6N femur, 5 tibia, 6 mm AOX PS insert, and 38 patellar   button.      SURGEON:  Pietro Cassis. Alvan Dame, M.D.      ASSISTANT:  Nehemiah Massed, PA-C.      ANESTHESIA:  Spinal.      SPECIMENS:  None.      COMPLICATION:  None.      DRAINS:  None.  EBL: <100cc      TOURNIQUET TIME:   Total Tourniquet Time Documented: Thigh (Left) - 32 minutes Total: Thigh (Left) - 32 minutes  .      The patient was stable to the recovery room.      INDICATION FOR PROCEDURE:  TEARSA Dixon is a 74 y.o. female patient of   mine.  The patient had been seen, evaluated, and treated conservatively in the   office with medication, activity modification, and injections.  The patient had   radiographic changes of bone-on-bone arthritis with endplate sclerosis and osteophytes noted.      The patient failed conservative measures including medication, injections, and activity modification, and at this point was ready for more definitive measures.   Based on the radiographic changes and failed conservative measures, the patient   decided to proceed with total knee replacement.  Risks of infection,   DVT, component failure, need for revision  surgery, postop course, and   expectations were all   discussed and reviewed.  Consent was obtained for benefit of pain   relief.      PROCEDURE IN DETAIL:  The patient was brought to the operative theater.   Once adequate anesthesia, preoperative antibiotics, 2 gm of Ancef, 10 mg of Decadron administered, the patient was positioned supine with the left thigh tourniquet placed.  The  left lower extremity was prepped and draped in sterile fashion.  A time-   out was performed identifying the patient, planned procedure, and   extremity.      The left lower extremity was placed in the [facility] leg holder.  The leg was   exsanguinated, tourniquet elevated to 250 mmHg.  A midline incision was   made followed by median parapatellar arthrotomy.  Following initial  exposure, attention was first directed to the patella.  Precut   measurement was noted to be 24 mm.  I resected down to 14 mm and used a   38 patellar button to restore patellar height as well as cover the cut   surface.      The lug holes were drilled and a metal shim was placed to protect the   patella from retractors and saw blades.      At this point, attention was now directed to the femur.  The femoral   canal was opened with a drill, irrigated to try to prevent fat emboli.  An   intramedullary rod was passed at 3 degrees valgus, 9 mm of bone was   resected off the distal femur.  Following this resection, the tibia was   subluxated anteriorly.  Using the extramedullary guide, 2 mm of bone was resected off   the proximal medial tibia.  We confirmed the gap would be   stable medially and laterally with a size 5 mm insert as well as confirmed   the cut was perpendicular in the coronal plane, checking with an alignment rod.      Once this was done, I sized the femur to be a size 6 in the anterior-   posterior dimension, chose a narrow component based on medial and   lateral dimension.  The size 6 rotation block was then pinned in    position anterior referenced using the C-clamp to set rotation.  The   anterior, posterior, and  chamfer cuts were made without difficulty nor   notching making certain that I was along the anterior cortex to help   with flexion gap stability.      The final box cut was made off the lateral aspect of distal femur.      At this point, the tibia was sized to be a size 5, the size 5 tray was   then pinned in position through the medial third of the tubercle,   drilled, and keel punched.  Trial reduction was now carried with a 6 femur,  5 tibia, a size 5 then 6 mm insert, and the 38 patella botton.  The knee was brought to   extension, full extension with good flexion stability with the patella   tracking through the trochlea without application of pressure.  Given   all these findings, the trial components removed.  Final components were   opened and cement was mixed.  The knee was irrigated with normal saline   solution and pulse lavage.  The synovial lining was   then injected with 30 cc of 0.25% Marcaine with epinephrine and 1 cc of Toradol plus 30 cc of NS for a  total of 61 cc.      The knee was irrigated.  Final implants were then cemented onto clean and   dried cut surfaces of bone with the knee brought to extension with a size 6 mm trial insert.      Once the cement had fully cured, the excess cement was removed   throughout the knee.  I confirmed I was satisfied with the range of   motion and stability, and the final size 6 mm PS AOX insert was chosen.  It was   placed into the knee.      The tourniquet had been let down at 32 minutes.  No significant   hemostasis required.  The   extensor mechanism was then reapproximated using #1  Vicryl and # 0 V-lock sutures with the knee   in flexion.  The   remaining wound was closed with 2-0 Vicryl and running 4-0 Monocryl.   The knee was cleaned, dried, dressed sterilely using Dermabond and   Aquacel dressing.  The patient was then    brought to recovery room in stable condition, tolerating the procedure   well.   Please note that Physician Assistant, Nehemiah Massed, PA-C, was present for the entirety of the case, and was utilized for pre-operative positioning, peri-operative retractor management, general facilitation of the procedure.  He was also utilized for primary wound closure at the end of the case.              Pietro Cassis Alvan Dame, M.D.    08/27/2014 12:14 PM

## 2014-08-31 NOTE — Discharge Summary (Signed)
Physician Discharge Summary  Patient ID: Kim Dixon MRN: [id number] DOB/AGE: Jun 21, 1940 74 y.o.  Admit date: 08/25/2014 Discharge date: 08/26/2014   Procedures:  Procedure(s) (LRB): LEFT TOTAL KNEE ARTHROPLASTY (Left)  Attending Physician:  Dr. Paralee Cancel   Admission Diagnoses:   Left knee primary OA / pain  Discharge Diagnoses:  Principal Problem:   S/P left TKA Active Problems:   S/P knee replacement   Obese  Past Medical History  Diagnosis Date  . Depression   . Hypertension   . Hyperlipidemia   . Glaucoma   . Varicose veins   . Heart murmur   . Pneumonia     hx of   . Anxiety   . Kidney stone     03/2014   . Urinary frequency   . Stress incontinence   . Arthritis     HPI:    Kim Dixon, 74 y.o. female, has a history of pain and functional disability in the left knee due to arthritis and has failed non-surgical conservative treatments for greater than 12 weeks to includeNSAID's and/or analgesics, corticosteriod injections and activity modification. Onset of symptoms was gradual, starting years ago with gradually worsening course since that time. The patient noted no past surgery on the left knee(s). Patient currently rates pain in the left knee(s) at 9 out of 10 with activity. Patient has worsening of pain with activity and weight bearing, pain that interferes with activities of daily living, pain with passive range of motion, crepitus and joint swelling. Patient has evidence of periarticular osteophytes and joint space narrowing by imaging studies. There is no active infection. Risks, benefits and expectations were discussed with the patient. Risks including but not limited to the risk of anesthesia, blood clots, nerve damage, blood vessel damage, failure of the prosthesis, infection and up to and including death. Patient understand the risks, benefits and expectations and wishes to proceed with surgery.   PCP: Asencion Noble, MD   Discharged  Condition: good  Hospital Course:  Patient underwent the above stated procedure on 08/25/2014. Patient tolerated the procedure well and brought to the recovery room in good condition and subsequently to the floor.  POD #1 BP: 145/66 ; Pulse: 57 ; Temp: 98.3 F (36.8 C) ; Resp: 16 Patient reports pain as mild, pain controlled. No events throughout the night. Ready to be discharged home. Dorsiflexion/plantar flexion intact, incision: dressing C/D/I, no cellulitis present and compartment soft.   LABS  Basename    HGB  12.4  HCT  37.4    Discharge Exam: General appearance: alert, cooperative and no distress Extremities: Homans sign is negative, no sign of DVT, no edema, redness or tenderness in the calves or thighs and no ulcers, gangrene or trophic changes  Disposition: Home with follow up in 2 weeks   Follow-up Information    Follow up with Mauri Pole, MD. Schedule an appointment as soon as possible for a visit in 2 weeks.   Specialty:  Orthopedic Surgery   Contact information:   [address] [place] [address] [phone]       Follow up with [facility].   Why:  3n1 (over the commmode seat)   Contact information:   [address] [place] [address] [phone]       Follow up with [facility].   Why:  home health physical therapy   Contact information:   [place] [address] [phone]  Discharge Instructions    Call MD / Call 911    Complete by:  As directed   If you experience chest pain or shortness of breath, CALL 911 and be transported to the hospital emergency room.  If you develope a fever above 101 F, pus (white drainage) or increased drainage or redness at the wound, or calf pain, call your surgeon's office.     Change dressing    Complete by:  As directed   Maintain surgical dressing until follow up in the clinic. If the edges start to pull up, may reinforce  with tape. If the dressing is no longer working, may remove and cover with gauze and tape, but must keep the area dry and clean.  Call with any questions or concerns.     Constipation Prevention    Complete by:  As directed   Drink plenty of fluids.  Prune juice may be helpful.  You may use a stool softener, such as Colace (over the counter) 100 mg twice a day.  Use MiraLax (over the counter) for constipation as needed.     Diet - low sodium heart healthy    Complete by:  As directed      Discharge instructions    Complete by:  As directed   Maintain surgical dressing until follow up in the clinic. If the edges start to pull up, may reinforce with tape. If the dressing is no longer working, may remove and cover with gauze and tape, but must keep the area dry and clean.  Follow up in 2 weeks at [facility]. Call with any questions or concerns.     Increase activity slowly as tolerated    Complete by:  As directed      TED hose    Complete by:  As directed   Use stockings (TED hose) for 2 weeks on both leg(s).  You may remove them at night for sleeping.     Weight bearing as tolerated    Complete by:  As directed   Laterality:  left  Extremity:  Lower             Medication List    STOP taking these medications        ibuprofen 200 MG tablet  Commonly known as:  ADVIL,MOTRIN     meloxicam 7.5 MG tablet  Commonly known as:  MOBIC      TAKE these medications        aspirin 325 MG EC tablet  Take 1 tablet (325 mg total) by mouth 2 (two) times daily.     bimatoprost 0.03 % ophthalmic solution  Commonly known as:  LUMIGAN  Place 1 drop into both eyes at bedtime.     Camphor-Menthol-Methyl Sal 1.2-5.7-6.3 % Ptch  Apply 1 patch topically daily as needed (pain).     cholecalciferol 1000 UNITS tablet  Commonly known as:  VITAMIN D  Take 1,000 Units by mouth every morning.     citalopram 20 MG tablet  Commonly known as:  CELEXA  Take 20 mg by mouth every morning.       docusate sodium 100 MG capsule  Commonly known as:  COLACE  Take 1 capsule (100 mg total) by mouth 2 (two) times daily.     ferrous sulfate 325 (65 FE) MG tablet  Take 1 tablet (325 mg total) by mouth 3 (three) times daily after meals.     furosemide 20 MG tablet  Commonly known as:  LASIX  Take 60 mg by mouth every morning.     HYDROcodone-acetaminophen 7.5-325 MG per tablet  Commonly known as:  NORCO  Take 1-2 tablets by mouth every 4 (four) hours as needed for moderate pain.     losartan-hydrochlorothiazide 100-25 MG per tablet  Commonly known as:  HYZAAR  Take 1 tablet by mouth every morning.     polyethylene glycol packet  Commonly known as:  MIRALAX / GLYCOLAX  Take 17 g by mouth 2 (two) times daily.     simvastatin 10 MG tablet  Commonly known as:  ZOCOR  Take 10 mg by mouth at bedtime.     tiZANidine 4 MG tablet  Commonly known as:  ZANAFLEX  Take 1 tablet (4 mg total) by mouth every 6 (six) hours as needed for muscle spasms.     vitamin C 1000 MG tablet  Take 3,000 mg by mouth every morning.         Signed: [place]. Jaiyla Granados   PA-C  08/31/2014, 10:02 AM

## 2014-09-14 ENCOUNTER — Ambulatory Visit (HOSPITAL_COMMUNITY)
Admission: RE | Admit: 2014-09-14 | Discharge: 2014-09-14 | Disposition: A | Discharge status: Home / Self Care | Payer: Medicare Other | Source: Ambulatory Visit | Attending: Internal Medicine | Admitting: Internal Medicine

## 2014-09-14 ENCOUNTER — Other Ambulatory Visit (HOSPITAL_COMMUNITY): Payer: Self-pay | Admitting: Internal Medicine

## 2014-09-14 DIAGNOSIS — Z1231 Encounter for screening mammogram for malignant neoplasm of breast: Secondary | ICD-10-CM | POA: Diagnosis not present

## 2014-09-15 ENCOUNTER — Ambulatory Visit (INDEPENDENT_AMBULATORY_CARE_PROVIDER_SITE_OTHER): Payer: Medicare Other | Admitting: Vascular Surgery

## 2014-09-15 ENCOUNTER — Encounter: Payer: Self-pay | Admitting: Vascular Surgery

## 2014-09-15 VITALS — BP 138/79 | HR 95 | Resp 18 | Ht 64.0 in | Wt 235.0 lb

## 2014-09-15 DIAGNOSIS — I83891 Varicose veins of right lower extremities with other complications: Secondary | ICD-10-CM

## 2014-09-15 NOTE — Progress Notes (Signed)
Subjective:     Patient ID: Kim Dixon, female   DOB: 09-26-1940, 74 y.o.   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 She states that the varicosities are less tense but continue to cause aching throbbing and burning discomfort despite long-leg elastic compression stockings 20-30 millimeter gradient an elevation with ibuprofen. She has no history of DVT. She's had no swelling in the right ankle. She did recently have a left knee replacement 3-4 weeks ago and is doing well from that standpoint. Past Medical History  Diagnosis Date  . Depression   . Hypertension   . Hyperlipidemia   . Glaucoma   . Varicose veins   . Heart murmur   . Pneumonia     hx of   . Anxiety   . Kidney stone     03/2014   . Urinary frequency   . Stress incontinence   . Arthritis     History  Substance Use Topics  . Smoking status: Former Smoker -- 3.00 packs/day for 30 years    Types: Cigarettes    Quit date: 04/04/1993  . Smokeless tobacco: Never Used  . Alcohol Use: 4.2 oz/week    7 Glasses of wine per week     Comment: wine nightly     Family History  Problem Relation Age of Onset  . Coronary artery disease Mother   . Lung cancer Father   . Colon cancer Neg Hx   . Stomach cancer Neg Hx     Allergies  Allergen Reactions  . Naproxen Sodium     Tightness in throat     Current outpatient prescriptions:  .  Ascorbic Acid (VITAMIN C) 1000 MG tablet, Take 3,000 mg by mouth every morning. , Disp: , Rfl:  .  aspirin EC 325 MG EC tablet, Take 1 tablet (325 mg total) by mouth 2 (two) times daily., Disp: 60 tablet, Rfl: 0 .  bimatoprost (LUMIGAN) 0.03 % ophthalmic solution, Place 1 drop into both eyes at bedtime., Disp: , Rfl:  .  cholecalciferol (VITAMIN D) 1000 UNITS tablet, Take 1,000 Units by mouth every morning. , Disp: , Rfl:  .  citalopram  (CELEXA) 20 MG tablet, Take 20 mg by mouth every morning. , Disp: , Rfl:  .  furosemide (LASIX) 20 MG tablet, Take 60 mg by mouth every morning. , Disp: , Rfl:  .  HYDROcodone-acetaminophen (NORCO) 7.5-325 MG per tablet, Take 1-2 tablets by mouth every 4 (four) hours as needed for moderate pain., Disp: 100 tablet, Rfl: 0 .  losartan-hydrochlorothiazide (HYZAAR) 100-25 MG per tablet, Take 1 tablet by mouth every morning., Disp: , Rfl:  .  meloxicam (MOBIC) 7.5 MG tablet, Take 7.5 mg by mouth as needed for pain., Disp: , Rfl:  .  polyethylene glycol (MIRALAX / GLYCOLAX) packet, Take 17 g by mouth 2 (two) times daily., Disp: 14 each, Rfl: 0 .  simvastatin (ZOCOR) 10 MG tablet, Take 10 mg by mouth at bedtime.  , Disp: , Rfl:  .  Camphor-Menthol-Methyl Sal 1.2-5.7-6.3 % PTCH, Apply 1 patch topically daily as needed (pain)., Disp: , Rfl:  .  docusate sodium (COLACE) 100 MG capsule, Take 1 capsule (100 mg total) by mouth 2 (two) times daily. (Patient not taking: Reported on 09/15/2014), Disp: 10 capsule, Rfl: 0 .  ferrous sulfate 325 (65 FE) MG tablet, Take 1 tablet (325  mg total) by mouth 3 (three) times daily after meals. (Patient not taking: Reported on 09/15/2014), Disp: , Rfl: 3 .  tiZANidine (ZANAFLEX) 4 MG tablet, Take 1 tablet (4 mg total) by mouth every 6 (six) hours as needed for muscle spasms. (Patient not taking: Reported on 09/15/2014), Disp: 40 tablet, Rfl: 0  Filed Vitals:   09/15/14 1050  BP: 138/79  Pulse: 95  Resp: 18  Height: 5\' 4"  (1.626 m)  Weight: 235 lb (106.595 kg)    Body mass index is 40.32 kg/(m^2).          Review of Systems recent total knee joint replacement left knee. Denies chest pain, dyspnea on exertion, PND, orthopnea.     Objective:   Physical Exam BP 138/79 mmHg  Pulse 95  Resp 18  Ht 5\' 4"  (1.626 m)  Wt 235 lb (106.595 kg)  BMI 40.32 kg/m2   Gen. Well-developed well-nourished female in no apparent distress alert and oriented 3 Lungs no rhonchi  or wheezing Cardiovascular regular with no murmurs  right leg with prominent varicosities beginning in mid thigh anteriorly extending lateral to the knee and into pretibial lateral space. Also some varicosities on the medial aspect of the leg below the knee. No hyperpigmentation or ulceration noted.     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

## 2014-09-16 ENCOUNTER — Ambulatory Visit (HOSPITAL_COMMUNITY): Payer: Medicare Other | Attending: Orthopedic Surgery | Admitting: Physical Therapy

## 2014-09-16 DIAGNOSIS — M25662 Stiffness of left knee, not elsewhere classified: Secondary | ICD-10-CM | POA: Diagnosis present

## 2014-09-16 DIAGNOSIS — R262 Difficulty in walking, not elsewhere classified: Secondary | ICD-10-CM | POA: Diagnosis not present

## 2014-09-16 DIAGNOSIS — R269 Unspecified abnormalities of gait and mobility: Secondary | ICD-10-CM

## 2014-09-16 DIAGNOSIS — R29898 Other symptoms and signs involving the musculoskeletal system: Secondary | ICD-10-CM | POA: Insufficient documentation

## 2014-09-16 NOTE — Therapy (Signed)
[place], [place], [address] Phone: [phone]   Fax:  [phone]  Physical Therapy Evaluation  Patient Details  Name: Kim Dixon MRN: [id number] Date of Birth: 1940/09/22 Referring Provider:  Paralee Cancel, MD  Encounter Date: 09/16/2014      PT End of Session - 09/16/14 1032    Visit Number 1   Number of Visits 8   Date for PT Re-Evaluation 10/16/14   PT Start Time 0803   PT Stop Time 0841   PT Time Calculation (min) 38 min   Activity Tolerance Patient tolerated treatment well   Behavior During Therapy [facility] for tasks assessed/performed      Past Medical History  Diagnosis Date  . Depression   . Hypertension   . Hyperlipidemia   . Glaucoma   . Varicose veins   . Heart murmur   . Pneumonia     hx of   . Anxiety   . Kidney stone     03/2014   . Urinary frequency   . Stress incontinence   . Arthritis     Past Surgical History  Procedure Laterality Date  . Abdominal hysterectomy    . Colonoscopy    . Colonoscopy N/A 06/18/2013    Procedure: COLONOSCOPY;  Surgeon: Rogene Houston, MD;  Location: [facility];  Service: Endoscopy;  Laterality: N/A;  830  . Lithotripsy    . Ablation saphenous vein w/ rfa      right leg  . Eye surgery      laser surgery bilat   . Tonsillectomy    . Total knee arthroplasty Left 08/25/2014    Procedure: LEFT TOTAL KNEE ARTHROPLASTY;  Surgeon: Paralee Cancel, MD;  Location: [facility];  Service: Orthopedics;  Laterality: Left;    There were no vitals filed for this visit.  Visit Diagnosis:  Stiffness of knee joint, left  Weakness of proximal end of lower extremity  Difficulty walking  Abnormality of gait      Subjective Assessment - 09/16/14 0802    Subjective Kim Dixon states that she had a TKR on her Lt on 08/25/2014.  She was discharged to home health and she is now being referred to Physical therapy.    Pertinent History HTN; DM, Rt  rotator cuff    How long can you sit comfortably? 20 minutes    How long can you stand comfortably? with cane able to stand 10 minutes    How long can you walk comfortably? with cane for 5-10   Patient Stated Goals walk without cane; be able to go back to silever sneakers; work in [employer]; go up and down steps in a reciprocal manner; walking more.    Currently in Pain? No/denies  worst 8/10             [facility] PT Assessment - 09/16/14 0001    Assessment   Medical Diagnosis Lt TKR   Onset Date/Surgical Date 08/25/14   Next MD Visit 10/12/2014   Prior Therapy HH   Precautions   Precautions None   Restrictions   Weight Bearing Restrictions No   Balance Screen   Has the patient fallen in the past 6 months No   Has the patient had a decrease in activity level because of a fear of falling?  Yes   Is the patient reluctant to leave their home because of a fear of falling?  No   Prior Function   Level of  Independence Independent   Vocation Retired   Chief Financial Officer; Architectural technologist   Overall Cognitive Status Within Functional Limits for tasks assessed   Observation/Other Assessments   Focus on Therapeutic Outcomes (FOTO)  57   Functional Tests   Functional tests Single leg stance   Single Leg Stance   Comments Rt 13; LT 6   ROM / Strength   AROM / PROM / Strength AROM;Strength   AROM   AROM Assessment Site Knee   Right/Left Knee Left   Left Knee Extension 15   Left Knee Flexion 120   Strength   Strength Assessment Site Hip;Knee;Ankle   Right/Left Hip Left   Left Hip Flexion 5/5   Left Hip ABduction 3+/5   Right/Left Knee Left   Left Knee Extension 5/5   Right/Left Ankle Left   Left Ankle Dorsiflexion 5/5   Palpation   Patella mobility decreased                    [facility] Adult PT Treatment/Exercise - 09/16/14 0001    Exercises   Exercises Knee/Hip   Knee/Hip Exercises: Stretches   Active Hamstring Stretch 3 reps;30 seconds   Knee/Hip Exercises: Supine   Quad  Sets 10 reps   Terminal Knee Extension 10 reps   Knee/Hip Exercises: Sidelying   Hip ABduction Strengthening;10 reps   Knee/Hip Exercises: Prone   Hip Extension 10 reps                PT Education - 09/16/14 0835    Education provided Yes   Person(s) Educated Patient   Methods Explanation;Handout   Comprehension Verbalized understanding;Returned demonstration          PT Short Term Goals - 09/16/14 1021    PT SHORT TERM GOAL #1   Title I in Hep   Time 1   Period Weeks   PT SHORT TERM GOAL #2   Title Pt to be walking in the house without a cane   Time 2   Period Weeks   PT SHORT TERM GOAL #3   Title Pt SLS to be at 15 seconds to reduce risk of falling    Time 2   Period Weeks   PT SHORT TERM GOAL #4   Title Pt ROM to be at 10 or less to allow normalized gt   Time 2   Period Weeks           PT Long Term Goals - 09/16/14 1022    PT LONG TERM GOAL #1   Title I in advance HEP   Time 4   Period Weeks   PT LONG TERM GOAL #2   Title Pt to be ambulating without an assistive device   Time 4   Period Weeks   PT LONG TERM GOAL #3   Title Pt to be able to walk for 30 minutes at a time for better health habits   Time 4   Period Weeks   PT LONG TERM GOAL #4   Title Pt to have returned to silver sneakers.   Time 4   Period Weeks   PT LONG TERM GOAL #5   Title Pt extension to be below 4 degrees for normalized gt    Time 4   Period Weeks   Additional Long Term Goals   Additional Long Term Goals Yes   PT LONG TERM GOAL #6   Title Pt pain to be no greater than a 2/10  80% of the day   Time 4   Period Weeks   PT LONG TERM GOAL #7   Title Pt to be able to go up and down steps in a reciprocal manner    Time 4   Period Weeks               Plan - 09-30-14 0846    Clinical Impression Statement Kim Dixon is a 74 yo female who has had a recent TKR.  She is now being referred to out patient physical therapy.  Examinatin demonstrates decreased ROM,  strength, activity tolerance and balance as well as increased pain and swelling.     She will benefit from skilled physcial therapy to address these issues and maximize her functional ability.    Pt will benefit from skilled therapeutic intervention in order to improve on the following deficits Abnormal gait;Decreased activity tolerance;Decreased balance;Decreased strength;Difficulty walking;Decreased range of motion;Pain   Rehab Potential Good   PT Frequency 2x / week   PT Duration 4 weeks   PT Treatment/Interventions Manual techniques;Therapeutic exercise;Balance training;Therapeutic activities;Patient/family education;Gait training;Stair training;Functional mobility training   PT Next Visit Plan Pt to begin standing rocker board, terminal extension, lateral and forward step ups as well as PROM and patellar mobs.    PT Home Exercise Plan given   Consulted and Agree with Plan of Care Patient          G-Codes - 30-Sep-2014 1029    Functional Assessment Tool Used foto   Functional Limitation Mobility: Walking and moving around   Mobility: Walking and Moving Around Current Status [phone]) At least 40 percent but less than 60 percent impaired, limited or restricted   Mobility: Walking and Moving Around Goal Status [phone]) At least 20 percent but less than 40 percent impaired, limited or restricted       Problem List Patient Active Problem List   Diagnosis Date Noted  . Obese 08/26/2014  . S/P left TKA 08/25/2014  . S/P knee replacement 08/25/2014  . Varicose veins of leg with complications 67/54/4920  . FATIGUE / MALAISE 12/29/2008  . DYSPNEA 12/29/2008  . ABNORMAL CV (STRESS) TEST 12/29/2008  . DEPRESSION 12/21/2008  . HYPERTENSION 12/21/2008    Rayetta Humphrey, PT CLT [phone] Sep 30, 2014, 10:32 AM  [place], [place], [address] Phone: [phone]   Fax:  [phone]

## 2014-09-16 NOTE — Patient Instructions (Addendum)
Stretching: Hamstring (Supine)   Supporting right thigh behind knee, slowly straighten knee until stretch is felt in back of thigh. Hold _30___ seconds. Repeat _3__ times per set. Do __1__ sets per session. Do _2___ sessions per day.  http://orth.exer.us/656   Copyright  VHI. All rights reserved.  Strengthening: Quadriceps Set   Tighten muscles on top of thighs by pushing knees down into surface. Hold __5__ seconds. Repeat _10___ times per set. Do ___1_ sets per session. Do ___2_ sessions per day.  http://orth.exer.us/602   Copyright  VHI. All rights reserved.  Strengthening: Hip Abduction (Side-Lying)   Tighten muscles on front of left thigh, then lift leg _15___ inches from surface, keeping knee locked.  Repeat _10___ times per set. Do ___1_ sets per session. Do ___2_ sessions per day.  http://orth.exer.us/622   Copyright  VHI. All rights reserved.  Strengthening: Hip Extension (Prone)   Tighten muscles on front of left thigh, then lift leg ____2 inches from surface, keeping knee locked. Repeat __10__ times per set. Do _1___ sets per session. Do _2___ sessions per day.  http://orth.exer.us/620   Copyright  VHI. All rights reserved.  Strengthening: Terminal Knee Extension (Supine)   With right knee over bolster, straighten knee by tightening muscles on top of thigh. Keep bottom of knee on bolster. Repeat 10____ times per set. Do __1__ sets per session. Do ___2_ sessions per day.  http://orth.exer.us/626   Copyright  VHI. All rights reserved.  Knee Extension Mobilization: Towel Prop   With rolled towel under right ankle, place _4-5___ pound weight across knee. Hold __5-30__ minutes. Repeat __1__ times per set. Do ___1_ sets per session. Do _1___ sessions per day.  http://orth.exer.us/720   Copyright  VHI. All rights reserved.  Knee Extension Mobilization: Hang (Prone)   With table supporting thighs, place __0__ pound weight on right ankle. Hold _5-30___  minutes. Repeat ___1_ times per set. Do 1____ sets per session. Do ____1 sessions per day.  http://orth.exer.us/722   Copyright  VHI. All rights reserved.

## 2014-09-18 ENCOUNTER — Ambulatory Visit (HOSPITAL_COMMUNITY): Payer: Medicare Other | Attending: Orthopedic Surgery | Admitting: Physical Therapy

## 2014-09-18 DIAGNOSIS — M25511 Pain in right shoulder: Secondary | ICD-10-CM | POA: Diagnosis present

## 2014-09-18 DIAGNOSIS — R262 Difficulty in walking, not elsewhere classified: Secondary | ICD-10-CM

## 2014-09-18 DIAGNOSIS — R29898 Other symptoms and signs involving the musculoskeletal system: Secondary | ICD-10-CM

## 2014-09-18 DIAGNOSIS — R269 Unspecified abnormalities of gait and mobility: Secondary | ICD-10-CM | POA: Diagnosis present

## 2014-09-18 DIAGNOSIS — M629 Disorder of muscle, unspecified: Secondary | ICD-10-CM | POA: Insufficient documentation

## 2014-09-18 DIAGNOSIS — M19011 Primary osteoarthritis, right shoulder: Secondary | ICD-10-CM | POA: Diagnosis present

## 2014-09-18 DIAGNOSIS — M25611 Stiffness of right shoulder, not elsewhere classified: Secondary | ICD-10-CM | POA: Insufficient documentation

## 2014-09-18 DIAGNOSIS — M25662 Stiffness of left knee, not elsewhere classified: Secondary | ICD-10-CM | POA: Diagnosis present

## 2014-09-18 DIAGNOSIS — M7541 Impingement syndrome of right shoulder: Secondary | ICD-10-CM | POA: Diagnosis present

## 2014-09-18 NOTE — Therapy (Signed)
[place], [place], [address] Phone: [phone]   Fax:  [phone]  Physical Therapy Treatment  Patient Details  Name: Kim Dixon MRN: [id number] Date of Birth: [birth year] Referring Provider:  Paralee Cancel, MD  Encounter Date: 09/18/2014      PT End of Session - 09/18/14 1031    Visit Number 2   Number of Visits 8   Date for PT Re-Evaluation 10/16/14   PT Start Time 0932   PT Stop Time 1014   PT Time Calculation (min) 42 min   Activity Tolerance Patient tolerated treatment well   Behavior During Therapy [facility] for tasks assessed/performed      Past Medical History  Diagnosis Date  . Depression   . Hypertension   . Hyperlipidemia   . Glaucoma   . Varicose veins   . Heart murmur   . Pneumonia     hx of   . Anxiety   . Kidney stone     03/2014   . Urinary frequency   . Stress incontinence   . Arthritis     Past Surgical History  Procedure Laterality Date  . Abdominal hysterectomy    . Colonoscopy    . Colonoscopy N/A 06/18/2013    Procedure: COLONOSCOPY;  Surgeon: Rogene Houston, MD;  [facility];  Service: Endoscopy;  Laterality: N/A;  830  . Lithotripsy    . Ablation saphenous vein w/ rfa      right leg  . Eye surgery      laser surgery bilat   . Tonsillectomy    . Total knee arthroplasty Left 08/25/2014    Procedure: LEFT TOTAL KNEE ARTHROPLASTY;  Surgeon: Paralee Cancel, MD;  [facility];  Service: Orthopedics;  Laterality: Left;    There were no vitals filed for this visit.  Visit Diagnosis:  Stiffness of knee joint, left  Weakness of proximal end of lower extremity  Difficulty walking  Abnormality of gait      Subjective Assessment - 09/18/14 0935    Subjective Pt reports that her L knee feels like a ball of lead today. She is also having small bouts of nausea lately, and she is not sure if it is from her pain pill.    Currently in Pain? No/denies             [facility] Adult PT Treatment/Exercise - 09/18/14 0001    Ambulation/Gait   Stairs Yes   Stairs Assistance 5: Supervision   Stair Management Technique Two rails;Alternating pattern   Number of Stairs 5  x 3 RT   Height of Stairs 4   Knee/Hip Exercises: Stretches   Active Hamstring Stretch 3 reps;30 seconds   Active Hamstring Stretch Limitations 14 inch step   Gastroc Stretch 30 seconds;3 reps   Knee/Hip Exercises: Standing   Terminal Knee Extension 15 reps   Theraband Level (Terminal Knee Extension) Level 2 (Red)   Lateral Step Up 10 reps;Step Height: 6"   Forward Step Up 10 reps;Step Height: 6"   Rocker Board Other (comment)  x30   Rocker Board Limitations x30 A/P and R/L   Knee/Hip Exercises: Seated   Long Arc Quad 15 reps   Long Arc Quad Weight 3 lbs.   Knee/Hip Exercises: Supine   Quad Sets 10 reps   Quad Sets Limitations 3 second hold   Manual Therapy   Manual Therapy Passive ROM   Passive ROM for knee extension  PT Education - 09/18/14 1031    Education provided Yes   Education Details HEP was reviewed due to confusion with 2 exercises   Person(s) Educated Patient   Methods Explanation;Handout   Comprehension Verbalized understanding          PT Short Term Goals - 09/16/14 1021    PT SHORT TERM GOAL #1   Title I in Hep   Time 1   Period Weeks   PT SHORT TERM GOAL #2   Title Pt to be walking in the house without a cane   Time 2   Period Weeks   PT SHORT TERM GOAL #3   Title Pt SLS to be at 15 seconds to reduce risk of falling    Time 2   Period Weeks   PT SHORT TERM GOAL #4   Title Pt ROM to be at 10 or less to allow normalized gt   Time 2   Period Weeks           PT Long Term Goals - 09/16/14 1022    PT LONG TERM GOAL #1   Title I in advance HEP   Time 4   Period Weeks   PT LONG TERM GOAL #2   Title Pt to be ambulating without an assistive device   Time 4   Period Weeks   PT LONG TERM GOAL #3   Title Pt  to be able to walk for 30 minutes at a time for better health habits   Time 4   Period Weeks   PT LONG TERM GOAL #4   Title Pt to have returned to silver sneakers.   Time 4   Period Weeks   PT LONG TERM GOAL #5   Title Pt extension to be below 4 degrees for normalized gt    Time 4   Period Weeks   Additional Long Term Goals   Additional Long Term Goals Yes   PT LONG TERM GOAL #6   Title Pt pain to be no greater than a 2/10 80% of the day   Time 4   Period Weeks   PT LONG TERM GOAL #7   Title Pt to be able to go up and down steps in a reciprocal manner    Time 4   Period Weeks               Plan - 09/18/14 1032    Clinical Impression Statement Pt responded well to today's treatment session, with no c/o pain with introduction of forward and lateral step ups, TKE, and LAQ with 3 pounds. She continues to lack knee extension, and her HEP was reviewed to ensure that she was performing extension exercises properly.    PT Next Visit Plan Continue with forward and lateral step ups, adding step downs if appropriate next session. Continue with PROM for knee extension.    Consulted and Agree with Plan of Care Patient        Problem List Patient Active Problem List   Diagnosis Date Noted  . Obese 08/26/2014  . S/P left TKA 08/25/2014  . S/P knee replacement 08/25/2014  . Varicose veins of leg with complications 62/69/4854  . FATIGUE / MALAISE 12/29/2008  . DYSPNEA 12/29/2008  . ABNORMAL CV (STRESS) TEST 12/29/2008  . DEPRESSION 12/21/2008  . HYPERTENSION 12/21/2008    Hilma Favors, PT, DPT [phone] 09/18/2014, 10:37 AM  [place] [address] [place], [place], [address] Phone: [phone]  Fax:  [phone]

## 2014-09-29 ENCOUNTER — Ambulatory Visit (HOSPITAL_COMMUNITY): Payer: Medicare Other | Admitting: Physical Therapy

## 2014-09-29 DIAGNOSIS — R269 Unspecified abnormalities of gait and mobility: Secondary | ICD-10-CM

## 2014-09-29 DIAGNOSIS — R29898 Other symptoms and signs involving the musculoskeletal system: Secondary | ICD-10-CM

## 2014-09-29 DIAGNOSIS — M25662 Stiffness of left knee, not elsewhere classified: Secondary | ICD-10-CM

## 2014-09-29 DIAGNOSIS — R262 Difficulty in walking, not elsewhere classified: Secondary | ICD-10-CM

## 2014-09-29 NOTE — Therapy (Signed)
[name], [place], [address] Phone: [phone]   Fax:  [phone]  Physical Therapy Treatment  Patient Details  Name: Kim Dixon MRN: [id number] Date of Birth: [birth year] Referring Provider:  Paralee Cancel, MD  Encounter Date: 09/29/2014      PT End of Session - 09/29/14 0841    Visit Number 3   Number of Visits 8   Date for PT Re-Evaluation 10/16/14   PT Start Time 0802   PT Stop Time 0315   PT Time Calculation (min) 45 min   Activity Tolerance Patient tolerated treatment well      Past Medical History  Diagnosis Date  . Depression   . Hypertension   . Hyperlipidemia   . Glaucoma   . Varicose veins   . Heart murmur   . Pneumonia     hx of   . Anxiety   . Kidney stone     03/2014   . Urinary frequency   . Stress incontinence   . Arthritis     Past Surgical History  Procedure Laterality Date  . Abdominal hysterectomy    . Colonoscopy    . Colonoscopy N/A 06/18/2013    Procedure: COLONOSCOPY;  Surgeon: Kim Houston, MD;  [facility];  Service: Endoscopy;  Laterality: N/A;  830  . Lithotripsy    . Ablation saphenous vein w/ rfa      right leg  . Eye surgery      laser surgery bilat   . Tonsillectomy    . Total knee arthroplasty Left 08/25/2014    Procedure: LEFT TOTAL KNEE ARTHROPLASTY;  Surgeon: Kim Cancel, MD;  [facility];  Service: Orthopedics;  Laterality: Left;    There were no vitals filed for this visit.  Visit Diagnosis:  Stiffness of knee joint, left  Weakness of proximal end of lower extremity  Difficulty walking  Abnormality of gait      Subjective Assessment - 09/29/14 0806    Subjective I feel like I am doing well but I'm still having quite a bit of pain    Currently in Pain? Yes   Pain Score 5    Pain Location Knee   Pain Orientation Left   Pain Descriptors / Indicators Aching;Burning                         [facility]  Adult PT Treatment/Exercise - 09/29/14 0001    Ambulation/Gait   Stairs Yes   Stairs Assistance 5: Supervision   Stair Management Technique Two rails;Alternating pattern   Number of Stairs 5  x 3 RT   Height of Stairs 4   Knee/Hip Exercises: Stretches   Active Hamstring Stretch 3 reps;30 seconds   Active Hamstring Stretch Limitations supine   Gastroc Stretch 30 seconds;3 reps   Gastroc Stretch Limitations slant board    Knee/Hip Exercises: Standing   Heel Raises 15 reps   Knee Flexion 10 reps   Terminal Knee Extension 15 reps   Theraband Level (Terminal Knee Extension) --   Terminal Knee Extension Limitations manual resistance    Lateral Step Up 10 reps;Step Height: 6"   Forward Step Up 10 reps;Step Height: 6"   Rocker Board 2 minutes  x30   Rocker Board Limitations --   SLS x5   Knee/Hip Exercises: Seated   Long Arc Quad --   Long Arc [employer] --   Knee/Hip Exercises: Supine  Quad Sets 10 reps   Heel Slides 10 reps   Terminal Knee Extension 10 reps   Knee Extension PROM   Manual Therapy   Manual Therapy Passive ROM   Passive ROM for knee extension                  PT Short Term Goals - 09/16/14 1021    PT SHORT TERM GOAL #1   Title I in Hep   Time 1   Period Weeks   PT SHORT TERM GOAL #2   Title Pt to be walking in the house without a cane   Time 2   Period Weeks   PT SHORT TERM GOAL #3   Title Pt SLS to be at 15 seconds to reduce risk of falling    Time 2   Period Weeks   PT SHORT TERM GOAL #4   Title Pt ROM to be at 10 or less to allow normalized gt   Time 2   Period Weeks           PT Long Term Goals - 09/16/14 1022    PT LONG TERM GOAL #1   Title I in advance HEP   Time 4   Period Weeks   PT LONG TERM GOAL #2   Title Pt to be ambulating without an assistive device   Time 4   Period Weeks   PT LONG TERM GOAL #3   Title Pt to be able to walk for 30 minutes at a time for better health habits   Time 4   Period Weeks   PT LONG  TERM GOAL #4   Title Pt to have returned to silver sneakers.   Time 4   Period Weeks   PT LONG TERM GOAL #5   Title Pt extension to be below 4 degrees for normalized gt    Time 4   Period Weeks   Additional Long Term Goals   Additional Long Term Goals Yes   PT LONG TERM GOAL #6   Title Pt pain to be no greater than a 2/10 80% of the day   Time 4   Period Weeks   PT LONG TERM GOAL #7   Title Pt to be able to go up and down steps in a reciprocal manner    Time 4   Period Weeks               Plan - 09/29/14 3267    Clinical Impression Statement Pt demonstrates improved gait but still has increased edema. Explained to pt that this is normal as pt is only 5 weeks post op.  Pt continues with decreased balance but is continuing to improve.   PT Next Visit Plan begin steps next treatemtn.         Problem List Patient Active Problem List   Diagnosis Date Noted  . Obese 08/26/2014  . S/P left TKA 08/25/2014  . S/P knee replacement 08/25/2014  . Varicose veins of leg with complications 12/45/8099  . FATIGUE / MALAISE 12/29/2008  . DYSPNEA 12/29/2008  . ABNORMAL CV (STRESS) TEST 12/29/2008  . DEPRESSION 12/21/2008  . HYPERTENSION 12/21/2008    Kim Dixon, PT CLT [phone] 09/29/2014, 8:44 AM  [place], [place], [address] Phone: [phone]   Fax:  [phone]

## 2014-10-01 ENCOUNTER — Encounter (HOSPITAL_COMMUNITY): Payer: Medicare Other | Admitting: Physical Therapy

## 2014-10-01 ENCOUNTER — Ambulatory Visit (HOSPITAL_COMMUNITY): Payer: Medicare Other

## 2014-10-06 ENCOUNTER — Ambulatory Visit (HOSPITAL_COMMUNITY): Payer: Medicare Other | Admitting: Physical Therapy

## 2014-10-06 DIAGNOSIS — R262 Difficulty in walking, not elsewhere classified: Secondary | ICD-10-CM

## 2014-10-06 DIAGNOSIS — M25662 Stiffness of left knee, not elsewhere classified: Secondary | ICD-10-CM | POA: Diagnosis not present

## 2014-10-06 DIAGNOSIS — R269 Unspecified abnormalities of gait and mobility: Secondary | ICD-10-CM

## 2014-10-06 DIAGNOSIS — R29898 Other symptoms and signs involving the musculoskeletal system: Secondary | ICD-10-CM

## 2014-10-06 NOTE — Therapy (Signed)
[place], [place], [address] Phone: [phone]   Fax:  [phone]  Physical Therapy Treatment  Patient Details  Name: Kim Dixon MRN: [id number] Date of Birth: [birth year] Referring Provider:  Paralee Cancel, MD  Encounter Date: 10/06/2014      PT End of Session - 10/06/14 0844    Visit Number 4   Number of Visits 8   Date for PT Re-Evaluation 10/16/14   PT Start Time 0801   PT Stop Time 0843   PT Time Calculation (min) 42 min   Activity Tolerance Patient tolerated treatment well   Behavior During Therapy [facility] for tasks assessed/performed      Past Medical History  Diagnosis Date  . Depression   . Hypertension   . Hyperlipidemia   . Glaucoma   . Varicose veins   . Heart murmur   . Pneumonia     hx of   . Anxiety   . Kidney stone     03/2014   . Urinary frequency   . Stress incontinence   . Arthritis     Past Surgical History  Procedure Laterality Date  . Abdominal hysterectomy    . Colonoscopy    . Colonoscopy N/A 06/18/2013    Procedure: COLONOSCOPY;  Surgeon: Rogene Houston, MD;  [facility];  Service: Endoscopy;  Laterality: N/A;  830  . Lithotripsy    . Ablation saphenous vein w/ rfa      right leg  . Eye surgery      laser surgery bilat   . Tonsillectomy    . Total knee arthroplasty Left 08/25/2014    Procedure: LEFT TOTAL KNEE ARTHROPLASTY;  Surgeon: Paralee Cancel, MD;  [facility];  Service: Orthopedics;  Laterality: Left;    There were no vitals filed for this visit.  Visit Diagnosis:  Stiffness of knee joint, left  Weakness of proximal end of lower extremity  Difficulty walking  Abnormality of gait      Subjective Assessment - 10/06/14 0804    Subjective Pt reports that she still cannot sleep through the night without having pain in her knee, but she feels that the pain is mostly on the surface along the incision.    Pain Score 6    Pain Location  Knee   Pain Orientation Left              [facility] Adult PT Treatment/Exercise - 10/06/14 0001    Ambulation/Gait   Stairs Yes   Stairs Assistance 5: Supervision   Stair Management Technique Two rails;Alternating pattern   Number of Stairs 4  x 4 RT   Height of Stairs 7   Knee/Hip Exercises: Stretches   Active Hamstring Stretch 3 reps;30 seconds   Active Hamstring Stretch Limitations 12" step   Gastroc Stretch 30 seconds;3 reps   Gastroc Stretch Limitations slant board    Knee/Hip Exercises: Standing   Heel Raises 15 reps   Terminal Knee Extension 15 reps   Theraband Level (Terminal Knee Extension) Level 3 (Green)   Lateral Step Up 10 reps;Step Height: 6"   Forward Step Up 10 reps;Step Height: 6"   Step Down 10 reps;Step Height: 2"   Functional Squat 10 reps   Functional Squat Limitations mini squats at mat table    Rocker Board 2 minutes   Rocker Board Limitations R/L and A/P   SLS x5 max of 9"   Knee/Hip Exercises: Supine   Quad  Sets 15 reps   Quad Sets Limitations 3 second hold   Heel Slides 15 reps   Manual Therapy   Manual Therapy Passive ROM   Passive ROM for knee extension 10" hold x 5                PT Education - 10/06/14 0844    Education provided No          PT Short Term Goals - 09/16/14 1021    PT SHORT TERM GOAL #1   Title I in Hep   Time 1   Period Weeks   PT SHORT TERM GOAL #2   Title Pt to be walking in the house without a cane   Time 2   Period Weeks   PT SHORT TERM GOAL #3   Title Pt SLS to be at 15 seconds to reduce risk of falling    Time 2   Period Weeks   PT SHORT TERM GOAL #4   Title Pt ROM to be at 10 or less to allow normalized gt   Time 2   Period Weeks           PT Long Term Goals - 09/16/14 1022    PT LONG TERM GOAL #1   Title I in advance HEP   Time 4   Period Weeks   PT LONG TERM GOAL #2   Title Pt to be ambulating without an assistive device   Time 4   Period Weeks   PT LONG TERM GOAL #3   Title  Pt to be able to walk for 30 minutes at a time for better health habits   Time 4   Period Weeks   PT LONG TERM GOAL #4   Title Pt to have returned to silver sneakers.   Time 4   Period Weeks   PT LONG TERM GOAL #5   Title Pt extension to be below 4 degrees for normalized gt    Time 4   Period Weeks   Additional Long Term Goals   Additional Long Term Goals Yes   PT LONG TERM GOAL #6   Title Pt pain to be no greater than a 2/10 80% of the day   Time 4   Period Weeks   PT LONG TERM GOAL #7   Title Pt to be able to go up and down steps in a reciprocal manner    Time 4   Period Weeks               Plan - 10/06/14 6812    Clinical Impression Statement Treatment focused on improving LLE strength and ROM. Pt responded well to addition of forward step downs and progression of stair height, denying any pain post treatment. She continues to demonstrate impairments in balance, as evidenced with limited SLS, but she is showing improvements with continued training.    PT Next Visit Plan Progress step downs if tolerated, continue with balance training.         Problem List Patient Active Problem List   Diagnosis Date Noted  . Obese 08/26/2014  . S/P left TKA 08/25/2014  . S/P knee replacement 08/25/2014  . Varicose veins of leg with complications 75/17/0017  . FATIGUE / MALAISE 12/29/2008  . DYSPNEA 12/29/2008  . ABNORMAL CV (STRESS) TEST 12/29/2008  . DEPRESSION 12/21/2008  . HYPERTENSION 12/21/2008    Hilma Favors, PT, DPT [phone] 10/06/2014, 8:59 AM  [place] [address]  [place], [place], [address] Phone: [phone]   Fax:  [phone]

## 2014-10-08 ENCOUNTER — Ambulatory Visit (HOSPITAL_COMMUNITY): Payer: Medicare Other | Admitting: Occupational Therapy

## 2014-10-08 ENCOUNTER — Encounter (HOSPITAL_COMMUNITY): Payer: Self-pay | Admitting: Occupational Therapy

## 2014-10-08 ENCOUNTER — Encounter: Payer: Self-pay | Admitting: Vascular Surgery

## 2014-10-08 ENCOUNTER — Ambulatory Visit (HOSPITAL_COMMUNITY): Payer: Medicare Other | Admitting: Physical Therapy

## 2014-10-08 DIAGNOSIS — R262 Difficulty in walking, not elsewhere classified: Secondary | ICD-10-CM

## 2014-10-08 DIAGNOSIS — M6289 Other specified disorders of muscle: Secondary | ICD-10-CM

## 2014-10-08 DIAGNOSIS — R269 Unspecified abnormalities of gait and mobility: Secondary | ICD-10-CM

## 2014-10-08 DIAGNOSIS — M25611 Stiffness of right shoulder, not elsewhere classified: Secondary | ICD-10-CM

## 2014-10-08 DIAGNOSIS — M629 Disorder of muscle, unspecified: Secondary | ICD-10-CM

## 2014-10-08 DIAGNOSIS — M19011 Primary osteoarthritis, right shoulder: Secondary | ICD-10-CM

## 2014-10-08 DIAGNOSIS — R29898 Other symptoms and signs involving the musculoskeletal system: Secondary | ICD-10-CM

## 2014-10-08 DIAGNOSIS — M25662 Stiffness of left knee, not elsewhere classified: Secondary | ICD-10-CM | POA: Diagnosis not present

## 2014-10-08 DIAGNOSIS — M7541 Impingement syndrome of right shoulder: Secondary | ICD-10-CM

## 2014-10-08 DIAGNOSIS — M25511 Pain in right shoulder: Secondary | ICD-10-CM

## 2014-10-08 NOTE — Therapy (Signed)
[place], [place], [address] Phone: [phone]   Fax:  [phone]  Physical Therapy Treatment  Patient Details  Name: Kim Dixon MRN: [id number] Date of Birth: 17-Jun-1940 Referring Provider:  Paralee Cancel, MD  Encounter Date: 10/08/2014      PT End of Session - 10/08/14 0906    Visit Number 5   Number of Visits 8   Date for PT Re-Evaluation 10/16/14   PT Start Time 0802   PT Stop Time 0845   PT Time Calculation (min) 43 min   Activity Tolerance Patient tolerated treatment well   Behavior During Therapy [facility] for tasks assessed/performed      Past Medical History  Diagnosis Date  . Depression   . Hypertension   . Hyperlipidemia   . Glaucoma   . Varicose veins   . Heart murmur   . Pneumonia     hx of   . Anxiety   . Kidney stone     03/2014   . Urinary frequency   . Stress incontinence   . Arthritis     Past Surgical History  Procedure Laterality Date  . Abdominal hysterectomy    . Colonoscopy    . Colonoscopy N/A 06/18/2013    Procedure: COLONOSCOPY;  Surgeon: Rogene Houston, MD;  [facility];  Service: Endoscopy;  Laterality: N/A;  830  . Lithotripsy    . Ablation saphenous vein w/ rfa      right leg  . Eye surgery      laser surgery bilat   . Tonsillectomy    . Total knee arthroplasty Left 08/25/2014    Procedure: LEFT TOTAL KNEE ARTHROPLASTY;  Surgeon: Paralee Cancel, MD;  [facility];  Service: Orthopedics;  Laterality: Left;    There were no vitals filed for this visit.  Visit Diagnosis:  Stiffness of knee joint, left  Weakness of proximal end of lower extremity  Difficulty walking  Abnormality of gait      Subjective Assessment - 10/08/14 0805    Subjective Pt reports that she is still having trouble sleeping due to pain in her L knee. She rates her pain as a 3/10 today.   Currently in Pain? Yes   Pain Score 3    Pain Location Knee   Pain  Orientation Left                [facility] Adult PT Treatment/Exercise - 10/08/14 0001    Knee/Hip Exercises: Stretches   Active Hamstring Stretch 3 reps;30 seconds   Active Hamstring Stretch Limitations 12" step   Gastroc Stretch 30 seconds;3 reps   Gastroc Stretch Limitations slant board    Knee/Hip Exercises: Standing   Heel Raises 15 reps   Forward Lunges 10 reps   Forward Lunges Limitations 6 inch box   Terminal Knee Extension 15 reps   Theraband Level (Terminal Knee Extension) Level 3 (Green)   Lateral Step Up 15 reps;Step Height: 6"   Forward Step Up 15 reps;Step Height: 6"   Step Down 10 reps;Step Height: 4"   Rocker Board 2 minutes   Rocker Board Limitations R/L and A/P   SLS x5 max of 6" on L   Knee/Hip Exercises: Supine   Heel Slides 15 reps   Manual Therapy   Manual Therapy Passive ROM   Passive ROM for knee extension 10" hold x 5  PT Short Term Goals - 09/16/14 1021    PT SHORT TERM GOAL #1   Title I in Hep   Time 1   Period Weeks   PT SHORT TERM GOAL #2   Title Pt to be walking in the house without a cane   Time 2   Period Weeks   PT SHORT TERM GOAL #3   Title Pt SLS to be at 15 seconds to reduce risk of falling    Time 2   Period Weeks   PT SHORT TERM GOAL #4   Title Pt ROM to be at 10 or less to allow normalized gt   Time 2   Period Weeks           PT Long Term Goals - 09/16/14 1022    PT LONG TERM GOAL #1   Title I in advance HEP   Time 4   Period Weeks   PT LONG TERM GOAL #2   Title Pt to be ambulating without an assistive device   Time 4   Period Weeks   PT LONG TERM GOAL #3   Title Pt to be able to walk for 30 minutes at a time for better health habits   Time 4   Period Weeks   PT LONG TERM GOAL #4   Title Pt to have returned to silver sneakers.   Time 4   Period Weeks   PT LONG TERM GOAL #5   Title Pt extension to be below 4 degrees for normalized gt    Time 4   Period Weeks   Additional Long  Term Goals   Additional Long Term Goals Yes   PT LONG TERM GOAL #6   Title Pt pain to be no greater than a 2/10 80% of the day   Time 4   Period Weeks   PT LONG TERM GOAL #7   Title Pt to be able to go up and down steps in a reciprocal manner    Time 4   Period Weeks               Plan - 10/08/14 5852    Clinical Impression Statement Treatment focused on functional strengthening for BLE,with progression of step-downs and addition of forward lunges. Pt denied any pain with progression of therex.    PT Next Visit Plan Progress balance training        Problem List Patient Active Problem List   Diagnosis Date Noted  . Obese 08/26/2014  . S/P left TKA 08/25/2014  . S/P knee replacement 08/25/2014  . Varicose veins of leg with complications 77/82/4235  . FATIGUE / MALAISE 12/29/2008  . DYSPNEA 12/29/2008  . ABNORMAL CV (STRESS) TEST 12/29/2008  . DEPRESSION 12/21/2008  . HYPERTENSION 12/21/2008    Hilma Favors, PT, DPT [phone]  10/08/2014, 9:26 AM  [place] [address] [place], [place], [address] Phone: [phone]   Fax:  [phone]

## 2014-10-08 NOTE — Therapy (Signed)
[place], [place], [address] Phone: [phone]   Fax:  [phone]  Occupational Therapy Evaluation  Patient Details  Name: Kim Dixon MRN: [id number] Date of Birth: [birth year] Referring Provider:  Lajean Manes, PA-C  Encounter Date: 10/08/2014      OT End of Session - 10/08/14 1626    Visit Number 1   Number of Visits 8   Date for OT Re-Evaluation 12/07/14  Mini reassessment8/18/2016   Authorization Type [employer] Medicare   OT Start Time 1533   OT Stop Time 1612   OT Time Calculation (min) 39 min   Activity Tolerance Patient tolerated treatment well   Behavior During Therapy [facility] for tasks assessed/performed      Past Medical History  Diagnosis Date  . Depression   . Hypertension   . Hyperlipidemia   . Glaucoma   . Varicose veins   . Heart murmur   . Pneumonia     hx of   . Anxiety   . Kidney stone     03/2014   . Urinary frequency   . Stress incontinence   . Arthritis     Past Surgical History  Procedure Laterality Date  . Abdominal hysterectomy    . Colonoscopy    . Colonoscopy N/A 06/18/2013    Procedure: COLONOSCOPY;  Surgeon: Rogene Houston, MD;  [facility];  Service: Endoscopy;  Laterality: N/A;  830  . Lithotripsy    . Ablation saphenous vein w/ rfa      right leg  . Eye surgery      laser surgery bilat   . Tonsillectomy    . Total knee arthroplasty Left 08/25/2014    Procedure: LEFT TOTAL KNEE ARTHROPLASTY;  Surgeon: Paralee Cancel, MD;  Location: [facility];  Service: Orthopedics;  Laterality: Left;    There were no vitals filed for this visit.  Visit Diagnosis:  Impingement syndrome of right shoulder  Localized osteoarthritis of right shoulder  Pain in right shoulder  Tight fascia  Decreased range of motion of right shoulder  Right arm weakness      Subjective Assessment - 10/08/14 1619    Subjective  S: I can usually do whatever I need to but I'm  having a lot of pain in that shoulder.   Pertinent History Pt is a 74 y/o female with osteoarthritis and impingement syndrome of the right shoulder. Pt has been experiencing increased pain with movement for approximately 6 months. Pt recieved therapy 4 years ago for similar pain which assisted in relieving discomfort during daily tasks. Pt was referred to occupational therapy for evaluation and treatment by Wyatt Portela, PA-C.    Patient Stated Goals To be able to use my arm without pain   Currently in Pain? Yes   Pain Score 2    Pain Location Shoulder   Pain Orientation Right   Pain Descriptors / Indicators Aching   Pain Type Acute pain           [facility] OT Assessment - 10/08/14 1525    Assessment   Diagnosis Osteoarthritis and impingement syndrome of right shoulder   Onset Date 04/09/14  off and on for 6 months   Prior Therapy yes, approximately 4 years ago   Precautions   Precautions None   Restrictions   Weight Bearing Restrictions No   Balance Screen   Has the patient fallen in the past 6 months No   Has the patient  had a decrease in activity level because of a fear of falling?  No   Is the patient reluctant to leave their home because of a fear of falling?  No   Home  Environment   Family/patient expects to be discharged to: Private residence   [place] with basic ADLs   Vocation Retired   Leisure silver sneakers 4x/week; pottery   ADL   ADL comments Pt has difficulty reaching behind her to fasten her bra, wash her back. Bringing arm down from overhead is difficult, lifting items into and out of tall cabinets. Pt complains of pain during completion of B/IADL tasks.     Written Expression   Dominant Hand Right   Vision - History   Baseline Vision Wears glasses all the time   Cognition   Overall Cognitive Status Within Functional Limits for tasks assessed   ROM / Strength   AROM / PROM / Strength  AROM;PROM;Strength   Palpation   Palpation comment Pt has max fascial restrictions along right upper arm, deltoid, upper trapezius, and scapularis regions.    AROM   Overall AROM Comments Assessed in standing, ER/IR adducted   AROM Assessment Site Shoulder   Right/Left Shoulder Left   Left Shoulder Flexion 158 Degrees   Left Shoulder ABduction 152 Degrees   Left Shoulder Internal Rotation 74 Degrees   Left Shoulder External Rotation 84 Degrees              Strength   Overall Strength Comments Assessed seated, ER/IR adducted   Strength Assessment Site Shoulder   Right/Left Shoulder Left   Left Shoulder Flexion 4/5   Left Shoulder ABduction 4/5   Left Shoulder Internal Rotation 4-/5   Left Shoulder External Rotation 4-/5            OT Education - 10/08/14 1626    Education provided Yes   Education Details Shoulder stretches   Person(s) Educated Patient   Methods Explanation;Demonstration;Handout   Comprehension Verbalized understanding;Returned demonstration          OT Short Term Goals - 10/08/14 1631    OT SHORT TERM GOAL #1   Title Pt will be educated on and independent in HEP.    Time 4   Period Weeks   Status New   OT SHORT TERM GOAL #2   Title Pt will return to highest level of function and independence in B/IADL tasks.    Time 4   Period Weeks   Status New   OT SHORT TERM GOAL #3   Title Pt will decrease pain to 2/10 or less during daily & leisure tasks.    Time 4   Period Weeks   Status New   OT SHORT TERM GOAL #4   Title Pt will increase range of motion to WNL to increase ability to fasten bra.    Time 4   Period Weeks   Status New   OT SHORT TERM GOAL #5   Title Pt will increase strength to 4+/5 to increase ability to lift and lower items out of high cabinets.    Time 4   Period Weeks   Status New   Additional Short Term Goals   Additional Short Term Goals Yes   OT SHORT TERM GOAL #6   Title Pt will decrease fascial restrictions to min  amounts or less in RUE.    Time 4   Period Weeks   Status  New                  Plan - 16-Oct-2014 1627    Clinical Impression Statement A: Pt is a 74 y/o female presenting with osteoarthritis and impingement syndrome of the right shoulder, causing increased pain and fascial restrictions, decreased range of motion and decreased strength limiting ability to complete B/IADL tasks. Pt reports she uses ice for pain managment and is able to complete BADL tasks however has increased pain and compensates for the pain with altered movements.    Pt will benefit from skilled therapeutic intervention in order to improve on the following deficits (Retired) Decreased strength;Pain;Impaired UE functional use;Increased fascial restricitons;Decreased range of motion;Impaired flexibility   Rehab Potential Good   OT Frequency 2x / week   OT Duration 4 weeks   OT Treatment/Interventions Self-care/ADL training;Passive range of motion;Patient/family education;Cryotherapy;Electrical Stimulation;Moist Heat;Therapeutic exercise;Manual Therapy;Therapeutic activities   Plan P: NEXT SESSION-COMPLETE FOTO. Pt will benefit from skilled occupational therapy services to decrease pain and fascial restrictions, and increase range of motion and strength. Treatment plan: Myofasical release, manual therapy, PROM, AROM, scapular stability & strengthening, general RUE strengthening.     OT Home Exercise Plan shoulder stretches   Consulted and Agree with Plan of Care Patient          G-Codes - 16-Oct-2014 1641    Functional Assessment Tool Used Clinical judgement   Functional Limitation Carrying, moving and handling objects   Carrying, Moving and Handling Objects Current Status [phone]) At least 40 percent but less than 60 percent impaired, limited or restricted   Carrying, Moving and Handling Objects Goal Status (G8185) At least 1 percent but less than 20 percent impaired, limited or restricted      Problem List Patient  Active Problem List   Diagnosis Date Noted  . Obese 08/26/2014  . S/P left TKA 08/25/2014  . S/P knee replacement 08/25/2014  . Varicose veins of leg with complications 63/14/9702  . FATIGUE / MALAISE 12/29/2008  . DYSPNEA 12/29/2008  . ABNORMAL CV (STRESS) TEST 12/29/2008  . DEPRESSION 12/21/2008  . HYPERTENSION 12/21/2008    Guadelupe Sabin, OTR/L  [phone]  10/16/2014, 4:42 PM  [place] [address] [place], [place], [address] Phone: [phone]   Fax:  [phone]

## 2014-10-08 NOTE — Patient Instructions (Signed)
  1) Flexion Wall Stretch    Face wall, place affected handon wall in front of you. Slide hand up the wall  and lean body in towards the wall. Hold for 5 seconds. Repeat 10 times. 1-2 times/day.     2) Towel Stretch with Internal Rotation   Gently pull up your affected arm  behind your back with the assist of a towel            3) Corner Stretch    Stand at a corner of a wall, place your arms on the walls with elbows bent. Lean into the corner until a stretch is felt along the front of your chest and/or shoulders. Hold for 5 seconds. Repeat 10X, 1-2 times/day.    4) Posterior Capsule Stretch    Bring the involved arm across chest. Grasp elbow and pull toward chest until you feel a stretch in the back of the upper arm and shoulder. Hold 5 seconds. Repeat 10X. Complete 1-2 times/day.    5) Scapular Retraction    Tuck chin back as you pinch shoulder blades together.  Hold 5 seconds. Repeat 10X. Complete 1-2 times/day.

## 2014-10-12 ENCOUNTER — Ambulatory Visit (INDEPENDENT_AMBULATORY_CARE_PROVIDER_SITE_OTHER): Payer: Medicare Other | Admitting: Vascular Surgery

## 2014-10-12 ENCOUNTER — Encounter: Payer: Self-pay | Admitting: Vascular Surgery

## 2014-10-12 VITALS — BP 142/77 | HR 69 | Temp 98.6°F | Resp 18 | Ht 64.0 in | Wt 227.0 lb

## 2014-10-12 DIAGNOSIS — I83891 Varicose veins of right lower extremities with other complications: Secondary | ICD-10-CM | POA: Diagnosis not present

## 2014-10-12 NOTE — Progress Notes (Signed)
Subjective:     Patient ID: Kim Dixon, female   DOB: 1941/02/13, 74 y.o.   MRN: [id number]  HPI this 74 year old female had multiple stab phlebectomy-approximately 25 performed under local tumescent anesthesia-right leg of painful varicosities. She tolerated the procedure well.   Review of Systems     Objective:   Physical Exam BP 142/77 mmHg  Pulse 69  Temp(Src) 98.6 F (37 C)  Resp 18  Ht 5\' 4"  (1.626 m)  Wt 227 lb (102.967 kg)  BMI 38.95 kg/m2       Assessment:     Well-tolerated multiple stab phlebectomy of painful varicosities performed under local tumescent anesthesia-greater than 20    Plan:     Return in 3 months for final follow-up

## 2014-10-12 NOTE — Progress Notes (Signed)
    Stab Phlebectomy Procedure  JANETE QUILLING DOB:1940-03-22  10/12/2014  Consent signed: Yes  Surgeon:J.D. Kellie Simmering  Procedure: stab phlebectomy: right leg  BP 142/77 mmHg  Pulse 69  Temp(Src) 98.6 F (37 C)  Resp 18  Ht 5\' 4"  (1.626 m)  Wt 227 lb (102.967 kg)  BMI 38.95 kg/m2  Start time: 9   End time: 9:45   Tumescent Anesthesia: 80 cc 0.9% NaCl with 50 cc Lidocaine HCL with 1% Epi and 15 cc 8.4% NaHCO3  Local Anesthesia: 8 cc Lidocaine HCL and NaHCO3 (ratio 2:1)    Stab Phlebectomy: >20 Sites: Thigh and Calf  Patient tolerated procedure well: Yes  Notes:   Description of Procedure:  After marking the course of the secondary varicosities, the patient was placed on the operating table in the supine position, and the right leg was prepped and draped in sterile fashion.    The patient was then put into Trendelenburg position.  Local anesthetic was administered at the previously marked varicosities, and tumescent anesthesia was administered around the vessels.  Greater than 20 stab wounds were made using the tip of an 11 blade. And using the vein hook, the phlebectomies were performed using a hemostat to avulse the varicosities.  Adequate hemostasis was achieved, and steri strips were applied to the stab wound.      ABD pads and thigh high compression stockings were applied as well ace wraps where needed. Blood loss was less than 15 cc.  The patient ambulated out of the operating room having tolerated the procedure well.

## 2014-10-13 ENCOUNTER — Encounter (HOSPITAL_COMMUNITY): Payer: Self-pay

## 2014-10-13 ENCOUNTER — Telehealth: Payer: Self-pay | Admitting: *Deleted

## 2014-10-13 ENCOUNTER — Ambulatory Visit (HOSPITAL_COMMUNITY): Payer: Medicare Other | Admitting: Physical Therapy

## 2014-10-13 ENCOUNTER — Ambulatory Visit (HOSPITAL_COMMUNITY): Payer: Medicare Other

## 2014-10-13 DIAGNOSIS — R29898 Other symptoms and signs involving the musculoskeletal system: Secondary | ICD-10-CM

## 2014-10-13 DIAGNOSIS — M629 Disorder of muscle, unspecified: Secondary | ICD-10-CM

## 2014-10-13 DIAGNOSIS — M25662 Stiffness of left knee, not elsewhere classified: Secondary | ICD-10-CM | POA: Diagnosis not present

## 2014-10-13 DIAGNOSIS — M25611 Stiffness of right shoulder, not elsewhere classified: Secondary | ICD-10-CM

## 2014-10-13 DIAGNOSIS — M6289 Other specified disorders of muscle: Secondary | ICD-10-CM

## 2014-10-13 NOTE — Telephone Encounter (Signed)
Pt doing well. No problems or bleeding from SP sites. Following all instructions.

## 2014-10-13 NOTE — Therapy (Signed)
[place], [place], [address] Phone: [phone]   Fax:  [phone]  Physical Therapy Treatment  Patient Details  Name: Kim Dixon MRN: [id number] Date of Birth: 1940-08-16 Referring Provider:  Paralee Cancel, MD  Encounter Date: 10/13/2014      PT End of Session - 10/13/14 0855    Visit Number 6   Number of Visits 12   Date for PT Re-Evaluation 10/29/14   PT Start Time 0806   PT Stop Time 0849   PT Time Calculation (min) 43 min   Activity Tolerance Patient tolerated treatment well      Past Medical History  Diagnosis Date  . Depression   . Hypertension   . Hyperlipidemia   . Glaucoma   . Varicose veins   . Heart murmur   . Pneumonia     hx of   . Anxiety   . Kidney stone     03/2014   . Urinary frequency   . Stress incontinence   . Arthritis     Past Surgical History  Procedure Laterality Date  . Abdominal hysterectomy    . Colonoscopy    . Colonoscopy N/A 06/18/2013    Procedure: COLONOSCOPY;  Surgeon: Rogene Houston, MD;  [facility];  Service: Endoscopy;  Laterality: N/A;  830  . Lithotripsy    . Ablation saphenous vein w/ rfa      right leg  . Eye surgery      laser surgery bilat   . Tonsillectomy    . Total knee arthroplasty Left 08/25/2014    Procedure: LEFT TOTAL KNEE ARTHROPLASTY;  Surgeon: Paralee Cancel, MD;  [facility];  Service: Orthopedics;  Laterality: Left;    There were no vitals filed for this visit.  Visit Diagnosis:  Stiffness of knee joint, left  Weakness of proximal end of lower extremity      Subjective Assessment - 10/13/14 0806    Subjective Pt had a vein oblation done yesterday.  She states the outer front part of her knee is really bothering "6"    Pertinent History HTN; DM, Rt  rotator cuff   Pain Score 6             [facility] PT Assessment - 10/13/14 0001    Assessment   Medical Diagnosis Lt TKR   Onset Date/Surgical Date  08/25/14   Next MD Visit 10/12/2014   Prior Therapy HH   Precautions   Precautions None   Restrictions   Weight Bearing Restrictions No   Prior Function   Level of Independence Independent   Vocation Retired   Chief Financial Officer; Architectural technologist   Overall Cognitive Status Within Functional Limits for tasks assessed   Observation/Other Assessments   Focus on Therapeutic Outcomes (FOTO)  --   Functional Tests   Functional tests Single leg stance   Single Leg Stance   Comments Rt 13; LT 6  no change    AROM   Left Knee Extension 10  was 15.(was less prior to blueberry picking)    Left Knee Flexion 125  was 120   Strength   Left Hip Flexion 5/5   Left Hip Extension 4-/5   Left Hip ABduction 5/5  was 3+/5    Left Knee Flexion 5/5   Left Knee Extension 5/5   Left Ankle Dorsiflexion 5/5   Palpation   Patella mobility decreased    Palpation comment Pt  has an area of palpatable swelling medial/inferior aspect of knee which occurred this weekend after blueberry picking.                      [facility] PT Treatment/Exercise - 10/13/14 0001    Exercises   Exercises Knee/Hip   Knee/Hip Exercises: Stretches   Active Hamstring Stretch 3 reps;30 seconds   Active Hamstring Stretch Limitations supine    Knee/Hip Exercises: Standing   Rocker Board 2 minutes   SLS x5   Knee/Hip Exercises: Supine   Quad Sets 15 reps   Terminal Knee Extension 15 reps   Bridges 10 reps   Knee Extension PROM   Knee/Hip Exercises: Sidelying   Hip ABduction --   Knee/Hip Exercises: Prone   Hip Extension 15 reps   Manual Therapy   Manual Therapy Soft tissue mobilization;Passive ROM   Manual therapy comments decongestive techniques to decrease pocked of swelling located at medial/inferior aspect of Lt knee    Passive ROM for knee extension 10" hold x 5                PT Education - 10/13/14 0854    Education provided Yes   Education Details reviewed what exercises pt  should continue to doe for current deficits    Person(s) Educated Patient   Methods Explanation;Verbal cues   Comprehension Verbalized understanding;Returned demonstration          PT Short Term Goals - 10/13/14 0836    PT SHORT TERM GOAL #1   Title I in Hep   Time 1   Period Weeks   Status Achieved   PT SHORT TERM GOAL #2   Title Pt to be walking in the house without a cane   Time 2   Period Weeks   Status Achieved   PT SHORT TERM GOAL #3   Title Pt SLS to be at 15 seconds to reduce risk of falling    Time 2   Period Weeks   Status Not Met   PT SHORT TERM GOAL #4   Title Pt ROM to be at 10 or less to allow normalized gt   Time 2   Period Weeks   Status Achieved           PT Long Term Goals - 10/13/14 4287    PT LONG TERM GOAL #1   Title I in advance HEP   Time 4   Period Weeks   Status Achieved   PT LONG TERM GOAL #2   Title Pt to be ambulating without an assistive device   Time 4   Period Weeks   Status Achieved   PT LONG TERM GOAL #3   Title Pt to be able to walk for 30 minutes at a time for better health habits   Time 4   Period Weeks   Status On-going   PT LONG TERM GOAL #4   Title Pt to have returned to silver sneakers.   Time 4   Period Weeks   Status Achieved   PT LONG TERM GOAL #5   Title Pt extension to be below 4 degrees for normalized gt    Time 4   Period Weeks   Status Not Met   PT LONG TERM GOAL #6   Title Pt pain to be no greater than a 2/10 80% of the day   Time 4   Period Weeks   Status On-going   PT LONG TERM GOAL #  7   Title Pt to be able to go up and down steps in a reciprocal manner    Time 4   Period Weeks   Status Achieved               Plan - 10/13/14 7322    Clinical Impression Statement PT to MD tomorrow.  Pt current deficits include blance, knee extension and hip extension strength.  Recommend continuing two more weeks past thisweek for a total of 6 weeks of therapy to resolve lag in extension and decreased  hip extensor and balance deficits as well as to assess area of increased swelling and pain.;( Pain decreased from a 6 to a 2 following manual decongestive techniques.)    PT Frequency 2x / week   PT Duration 6 weeks  total = 2 additional weeks.    PT Treatment/Interventions Manual techniques;Therapeutic exercise;Balance training;Therapeutic activities;Patient/family education;Gait training;Stair training;Functional mobility training   PT Next Visit Plan SLS; Warrior I; standing and supine terminal extension; Hamstring stretch both standing and supine, sit to stand with LT leg in back; cybex leg press Lt leg only, prone terminal extension, prone hip extension, manual if needed for swelling    Consulted and Agree with Plan of Care Patient        Problem List Patient Active Problem List   Diagnosis Date Noted  . Obese 08/26/2014  . S/P left TKA 08/25/2014  . S/P knee replacement 08/25/2014  . Varicose veins of leg with complications 02/54/2706  . FATIGUE / MALAISE 12/29/2008  . DYSPNEA 12/29/2008  . ABNORMAL CV (STRESS) TEST 12/29/2008  . DEPRESSION 12/21/2008  . HYPERTENSION 12/21/2008   Physical Therapy Progress Note  Dates of Reporting Period: 09/28/2014 to 10/13/2014  Objective Reports of Subjective Statement: Pt is walking without assistive device; back to silver sneakers.  Objective Measurements: see above  Goal Update: see above  Plan: see above  Reason Skilled Services are Required: see above.  Rayetta Humphrey, PT CLT [phone] 10/13/2014, 9:01 AM  [place], [place], [address] Phone: [phone]   Fax:  [phone]

## 2014-10-13 NOTE — Therapy (Signed)
[place], [place], [address] Phone: [phone]   Fax:  [phone]  Occupational Therapy Treatment  Patient Details  Name: Kim Dixon MRN: [id number] Date of Birth: [birth year] Referring Provider:  Lajean Manes, PA-C  Encounter Date: 10/13/2014      OT End of Session - 10/13/14 1514    Visit Number 2   Number of Visits 8   Date for OT Re-Evaluation 12/07/14  Mini reassessment8/18/2016   Authorization Type [employer] Medicare   Authorization Time Period before 10th visit   Authorization - Visit Number 2   Authorization - Number of Visits 10   OT Start Time 1430   OT Stop Time 1515   OT Time Calculation (min) 45 min   Activity Tolerance Patient tolerated treatment well   Behavior During Therapy [facility] for tasks assessed/performed      Past Medical History  Diagnosis Date  . Depression   . Hypertension   . Hyperlipidemia   . Glaucoma   . Varicose veins   . Heart murmur   . Pneumonia     hx of   . Anxiety   . Kidney stone     03/2014   . Urinary frequency   . Stress incontinence   . Arthritis     Past Surgical History  Procedure Laterality Date  . Abdominal hysterectomy    . Colonoscopy    . Colonoscopy N/A 06/18/2013    Procedure: COLONOSCOPY;  Surgeon: Rogene Houston, MD;  [facility];  Service: Endoscopy;  Laterality: N/A;  830  . Lithotripsy    . Ablation saphenous vein w/ rfa      right leg  . Eye surgery      laser surgery bilat   . Tonsillectomy    . Total knee arthroplasty Left 08/25/2014    Procedure: LEFT TOTAL KNEE ARTHROPLASTY;  Surgeon: Paralee Cancel, MD;  Location: [facility];  Service: Orthopedics;  Laterality: Left;    There were no vitals filed for this visit.  Visit Diagnosis:  Tight fascia  Right arm weakness  Shoulder joint stiffness, right      Subjective Assessment - 10/13/14 1455    Subjective  S: It only hurts if I bring my arm down in the wrong  way.    Special Tests FOTO score: 68/100 (32% impaired)   Currently in Pain? No/denies            [facility] OT Assessment - 10/13/14 1458    Assessment   Diagnosis Osteoarthritis and impingement syndrome of right shoulder   Precautions   Precautions None                  OT Treatments/Exercises (OP) - 10/13/14 1458    Exercises   Exercises Shoulder   Shoulder Exercises: Supine   Protraction PROM;5 reps;AROM;12 reps   Horizontal ABduction PROM;5 reps;AROM;12 reps   External Rotation PROM;5 reps;AROM;12 reps   Internal Rotation PROM;5 reps;AROM;12 reps   Flexion PROM;5 reps;AROM;12 reps   ABduction PROM;5 reps;AROM;12 reps   Shoulder Exercises: Seated   Protraction AROM;12 reps   Horizontal ABduction AROM;12 reps   External Rotation AROM;12 reps   Internal Rotation AROM;12 reps   Flexion AROM;12 reps   Abduction AROM;12 reps   Shoulder Exercises: ROM/Strengthening   Proximal Shoulder Strengthening, Supine 12X no rest breaks   Proximal Shoulder Strengthening, Seated 12X no rest breaks   Manual Therapy   Manual Therapy Myofascial release  Myofascial Release Myofascial release to right upper arm, trapezious, and scapularis region to decrease fascial restrictions and increase joint mobility in a pain free zone.                 OT Education - 10/13/14 1513    Education provided Yes   Education Details Pt given print out of occupational therapy evaluation   Methods Explanation;Handout   Comprehension Verbalized understanding          OT Short Term Goals - 10/13/14 1458    OT SHORT TERM GOAL #1   Title Pt will be educated on and independent in [place].    Status On-going   OT SHORT TERM GOAL #2   Title Pt will return to highest level of function and independence in B/IADL tasks.    Status On-going   OT SHORT TERM GOAL #3   Title Pt will decrease pain to 2/10 or less during daily & leisure tasks.    Status On-going   OT SHORT TERM GOAL #4   Title Pt  will increase range of motion to WNL to increase ability to fasten bra.    Status On-going   OT SHORT TERM GOAL #5   Title Pt will increase strength to 4+/5 to increase ability to lift and lower items out of high cabinets.    Status On-going   OT SHORT TERM GOAL #6   Title Pt will decrease fascial restrictions to min amounts or less in RUE.    Status On-going                  Plan - 10/13/14 1604    Clinical Impression Statement A: Initiated myofascial release, manual stretching, AROM, and proximal shoulder strengthening. Patient tolerated all exercises well. Had great response to myofascial release in the upper trapezius region. Mild reports of pain with some exercises although was not constant.    Plan P: Attempt a 1# with supine and seated exercises. Add scapular theraband exercises.         Problem List Patient Active Problem List   Diagnosis Date Noted  . Obese 08/26/2014  . S/P left TKA 08/25/2014  . S/P knee replacement 08/25/2014  . Varicose veins of leg with complications 09/98/3382  . FATIGUE / MALAISE 12/29/2008  . DYSPNEA 12/29/2008  . ABNORMAL CV (STRESS) TEST 12/29/2008  . DEPRESSION 12/21/2008  . HYPERTENSION 12/21/2008    Ailene Ravel, OTR/L,CBIS  [phone]  10/13/2014, 4:36 PM  [place] [address] [place], [place], [address] Phone: [phone]   Fax:  [phone]

## 2014-10-15 ENCOUNTER — Ambulatory Visit (HOSPITAL_COMMUNITY): Payer: Medicare Other | Admitting: Physical Therapy

## 2014-10-15 DIAGNOSIS — M25662 Stiffness of left knee, not elsewhere classified: Secondary | ICD-10-CM

## 2014-10-15 DIAGNOSIS — R29898 Other symptoms and signs involving the musculoskeletal system: Secondary | ICD-10-CM

## 2014-10-15 DIAGNOSIS — R262 Difficulty in walking, not elsewhere classified: Secondary | ICD-10-CM

## 2014-10-15 NOTE — Patient Instructions (Signed)
Functional Quadriceps: Sit to Stand   Sit on edge of chair, feet flat on floor. Stand upright, extending knees fully. Repeat __10-15__ times per set. Do __1__ sets per session. Do _2___ sessions per day.  http://orth.exer.us/734   Copyright  VHI. All rights reserved.  Terminal Knee Extension (Standing)   Facing anchor with right knee slightly bent and tubing just above knee, gently pull knee back straight. Do not overextend knee. Repeat __15__ times per set. Do _1__ sets per session. Do __1__ sessions per day.  http://orth.exer.us/666   Copyright  VHI. All rights reserved.

## 2014-10-15 NOTE — Therapy (Signed)
[facility] [address] [place], [place], [address] Phone: [phone]   Fax:  [phone]  Physical Therapy Treatment  Patient Details  Name: Kim Dixon MRN: [id number] Date of Birth: [birth year] Referring Provider:  Paralee Cancel, MD  Encounter Date: 10/15/2014      PT End of Session - 10/15/14 0841    Visit Number 7   Number of Visits 7   Authorization Type update sent to MD on 6 th visit    PT Start Time 0804   PT Stop Time 0849   PT Time Calculation (min) 45 min   Activity Tolerance Patient tolerated treatment well   Behavior During Therapy [facility] for tasks assessed/performed      Past Medical History  Diagnosis Date  . Depression   . Hypertension   . Hyperlipidemia   . Glaucoma   . Varicose veins   . Heart murmur   . Pneumonia     hx of   . Anxiety   . Kidney stone     03/2014   . Urinary frequency   . Stress incontinence   . Arthritis     Past Surgical History  Procedure Laterality Date  . Abdominal hysterectomy    . Colonoscopy    . Colonoscopy N/A 06/18/2013    Procedure: COLONOSCOPY;  Surgeon: Rogene Houston, MD;  [facility];  Service: Endoscopy;  Laterality: N/A;  830  . Lithotripsy    . Ablation saphenous vein w/ rfa      right leg  . Eye surgery      laser surgery bilat   . Tonsillectomy    . Total knee arthroplasty Left 08/25/2014    Procedure: LEFT TOTAL KNEE ARTHROPLASTY;  Surgeon: Paralee Cancel, MD;  Location: [facility];  Service: Orthopedics;  Laterality: Left;    There were no vitals filed for this visit.  Visit Diagnosis:  Stiffness of knee joint, left  Weakness of proximal end of lower extremity  Difficulty walking      Subjective Assessment - 10/15/14 0824    Subjective Pt went to her MD and today is her last treatment.  Area along medial inferior aspect still bothers her but is better.   How long can you stand comfortably? able to stand for 30 minutes without a cane was  10 with a cane.    How long can you walk comfortably? able to walk for 30 minutes without a cane was 5-10 with a cane.    Currently in Pain? Yes   Pain Score 2    Pain Orientation Left   Pain Descriptors / Indicators Aching            [facility] PT Assessment - 10/15/14 0001    Assessment   Medical Diagnosis Lt TKR   Onset Date/Surgical Date 08/25/14   Next MD Visit 10/12/2014   Prior Therapy [facility]   Precautions   Precautions None   Restrictions   Weight Bearing Restrictions No   Prior Function   Level of Independence Independent   Vocation Retired   Chief Financial Officer; Architectural technologist   Overall Cognitive Status Within Functional Limits for tasks assessed   Observation/Other Assessments   Focus on Therapeutic Outcomes (FOTO)  68 was 57 out of 100   Functional Tests   Functional tests Single leg stance   Single Leg Stance   Comments Rt 13; LT 6  no change    AROM   Left  Knee Extension 10  was 15.(was less prior to blueberry picking)    Left Knee Flexion 125  was 120   Strength   Left Hip Flexion 5/5   Left Hip Extension 4-/5   Left Hip ABduction 5/5  was 3+/5    Left Knee Flexion 5/5   Left Knee Extension 5/5   Left Ankle Dorsiflexion 5/5   Palpation   Patella mobility decreased    Palpation comment Pt has an area of palpatable swelling medial/inferior aspect of knee which occurred this weekend after blueberry picking.                      [facility] PT Treatment/Exercise - 10/15/14 0001    Knee/Hip Exercises: Standing   Terminal Knee Extension 15 reps   Theraband Level (Terminal Knee Extension) Level 4 (Blue)   SLS x5   Knee/Hip Exercises: Seated   Sit to Sand 10 reps   Knee/Hip Exercises: Supine   Quad Sets 15 reps   Terminal Knee Extension 15 reps   Bridges 10 reps   Knee/Hip Exercises: Prone   Hip Extension 15 reps                PT Education - 10/15/14 0830    Education provided Yes   Education Details new HEP    Person(s) Educated Patient   Methods Explanation;Handout   Comprehension Verbalized understanding;Returned demonstration          PT Short Term Goals - 10/13/14 0836    PT SHORT TERM GOAL #1   Title I in Hep   Time 1   Period Weeks   Status Achieved   PT SHORT TERM GOAL #2   Title Pt to be walking in the house without a cane   Time 2   Period Weeks   Status Achieved   PT SHORT TERM GOAL #3   Title Pt SLS to be at 15 seconds to reduce risk of falling    Time 2   Period Weeks   Status Not Met   PT SHORT TERM GOAL #4   Title Pt ROM to be at 10 or less to allow normalized gt   Time 2   Period Weeks   Status Achieved           PT Long Term Goals - 10/13/14 5686    PT LONG TERM GOAL #1   Title I in advance HEP   Time 4   Period Weeks   Status Achieved   PT LONG TERM GOAL #2   Title Pt to be ambulating without an assistive device   Time 4   Period Weeks   Status Achieved   PT LONG TERM GOAL #3   Title Pt to be able to walk for 30 minutes at a time for better health habits   Time 4   Period Weeks   Status On-going   PT LONG TERM GOAL #4   Title Pt to have returned to silver sneakers.   Time 4   Period Weeks   Status Achieved   PT LONG TERM GOAL #5   Title Pt extension to be below 4 degrees for normalized gt    Time 4   Period Weeks   Status Not Met   PT LONG TERM GOAL #6   Title Pt pain to be no greater than a 2/10 80% of the day   Time 4   Period Weeks   Status On-going  PT LONG TERM GOAL #7   Title Pt to be able to go up and down steps in a reciprocal manner    Time 4   Period Weeks   Status Achieved               Plan - 11-10-14 8719    Clinical Impression Statement Pt MD stated alright to discharge pt after today after getting reassessment.  Pt went over HEP to be completed and any questions were answered by therapist    PT Next Visit Plan discharge per MD           G-Codes - Nov 10, 2014 0843    Functional Assessment Tool Used foto    Functional Limitation Mobility: Walking and moving around   Mobility: Walking and Moving Around Goal Status [phone]) At least 20 percent but less than 40 percent impaired, limited or restricted   Mobility: Walking and Moving Around Discharge Status [phone]) At least 20 percent but less than 40 percent impaired, limited or restricted      Problem List Patient Active Problem List   Diagnosis Date Noted  . Obese 08/26/2014  . S/P left TKA 08/25/2014  . S/P knee replacement 08/25/2014  . Varicose veins of leg with complications 39/17/9217  . FATIGUE / MALAISE 12/29/2008  . DYSPNEA 12/29/2008  . ABNORMAL CV (STRESS) TEST 12/29/2008  . DEPRESSION 12/21/2008  . HYPERTENSION 12/21/2008    RUSSELL,CINDY 10-Nov-2014, 8:44 AM  [facility] [place], [place], [address] Phone: [phone]   Fax:  [phone]   PHYSICAL THERAPY DISCHARGE SUMMARY  Visits from Start of Car: 7  Current functional level related to goals / functional outcomes: All goals met    Remaining deficits: Hip extension weakness    Education / Equipment: HEP to include sit to stand and standing terminal extension  Plan: Patient agrees to discharge.  Patient goals were met. Patient is being discharged due to meeting the stated rehab goals.  ?????   Rayetta Humphrey, [place] CLT [phone]

## 2014-10-16 ENCOUNTER — Ambulatory Visit (HOSPITAL_COMMUNITY): Payer: Medicare Other | Admitting: Occupational Therapy

## 2014-10-16 ENCOUNTER — Encounter (HOSPITAL_COMMUNITY): Payer: Self-pay | Admitting: Occupational Therapy

## 2014-10-16 DIAGNOSIS — M25511 Pain in right shoulder: Secondary | ICD-10-CM

## 2014-10-16 DIAGNOSIS — M629 Disorder of muscle, unspecified: Secondary | ICD-10-CM

## 2014-10-16 DIAGNOSIS — M25611 Stiffness of right shoulder, not elsewhere classified: Secondary | ICD-10-CM

## 2014-10-16 DIAGNOSIS — M25662 Stiffness of left knee, not elsewhere classified: Secondary | ICD-10-CM | POA: Diagnosis not present

## 2014-10-16 DIAGNOSIS — R29898 Other symptoms and signs involving the musculoskeletal system: Secondary | ICD-10-CM

## 2014-10-16 DIAGNOSIS — M6289 Other specified disorders of muscle: Secondary | ICD-10-CM

## 2014-10-16 NOTE — Therapy (Signed)
[facility] [place], [place], [address] Phone: [phone]   Fax:  [phone]  Occupational Therapy Treatment  Patient Details  Name: Kim Dixon MRN: [id number] Date of Birth: March 26, 1940 Referring Provider:  Asencion Noble, MD  Encounter Date: 10/16/2014      OT End of Session - 10/16/14 1621    Visit Number 3   Number of Visits 8   Date for OT Re-Evaluation 12/07/14  Mini reassessment8/18/2016   Authorization Type U[employer] Medicare   Authorization Time Period before 10th visit   Authorization - Visit Number 3   Authorization - Number of Visits 10   OT Start Time 1310   OT Stop Time 1348   OT Time Calculation (min) 38 min   Activity Tolerance Patient tolerated treatment well   Behavior During Therapy [facility] for tasks assessed/performed      Past Medical History  Diagnosis Date  . Depression   . Hypertension   . Hyperlipidemia   . Glaucoma   . Varicose veins   . Heart murmur   . Pneumonia     hx of   . Anxiety   . Kidney stone     03/2014   . Urinary frequency   . Stress incontinence   . Arthritis     Past Surgical History  Procedure Laterality Date  . Abdominal hysterectomy    . Colonoscopy    . Colonoscopy N/A 06/18/2013    Procedure: COLONOSCOPY;  Surgeon: Rogene Houston, MD;  [facility];  Service: Endoscopy;  Laterality: N/A;  830  . Lithotripsy    . Ablation saphenous vein w/ rfa      right leg  . Eye surgery      laser surgery bilat   . Tonsillectomy    . Total knee arthroplasty Left 08/25/2014    Procedure: LEFT TOTAL KNEE ARTHROPLASTY;  Surgeon: Paralee Cancel, MD;  Location: [facility];  Service: Orthopedics;  Laterality: Left;    There were no vitals filed for this visit.  Visit Diagnosis:  Tight fascia  Right arm weakness  Shoulder joint stiffness, right  Decreased range of motion of right shoulder  Pain in right shoulder      Subjective Assessment - 10/16/14 1312     Subjective  S: It hurt when I put my bra on today.    Currently in Pain? 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   Precautions   Precautions None                  OT Treatments/Exercises (OP) - 10/16/14 1329    Exercises   Exercises Shoulder   Shoulder Exercises: Supine   Protraction PROM;5 reps;Strengthening;10 reps   Protraction Weight (lbs) 1   Horizontal ABduction PROM;5 reps;Strengthening;10 reps   Horizontal ABduction Weight (lbs) 1   External Rotation PROM;5 reps;Strengthening;10 reps   External Rotation Weight (lbs) 1   Internal Rotation PROM;5 reps;Strengthening;10 reps   Internal Rotation Weight (lbs) 1   Flexion PROM;5 reps;Strengthening;10 reps   Shoulder Flexion Weight (lbs) 1   ABduction PROM;5 reps;AROM;10 reps   Shoulder ABduction Weight (lbs) 1   Shoulder Exercises: Seated   Protraction Strengthening;10 reps   Protraction Weight (lbs) 1   Horizontal ABduction Strengthening;10 reps   Horizontal ABduction Weight (lbs) 1   External Rotation  Strengthening;10 reps   External Rotation Weight (lbs) 1   Internal Rotation Strengthening;10 reps   Internal Rotation Weight (lbs) 1   Flexion Strengthening;10 reps   Flexion Weight (lbs) 1   Abduction Strengthening;10 reps   ABduction Weight (lbs) 1   Shoulder Exercises: Standing   Extension Theraband;10 reps   Theraband Level (Shoulder Extension) Level 2 (Red)   Row Theraband;10 reps   Theraband Level (Shoulder Row) Level 2 (Red)   Retraction Theraband;10 reps   Theraband Level (Shoulder Retraction) Level 2 (Red)   Shoulder Exercises: ROM/Strengthening   Proximal Shoulder Strengthening, Supine 12X no rest breaks   Proximal Shoulder Strengthening, Seated 12X no rest breaks   Manual Therapy   Manual Therapy Myofascial release   Myofascial Release Myofascial release to right upper arm, trapezious, and  scapularis region to decrease fascial restrictions and increase joint mobility in a pain free zone.                 OT Education - 10/16/14 1621    Education provided Yes   Education Details scapular theraband HEP   Person(s) Educated Patient   Methods Explanation;Demonstration;Handout   Comprehension Verbalized understanding;Returned demonstration          OT Short Term Goals - 10/13/14 1458    OT SHORT TERM GOAL #1   Title Pt will be educated on and independent in HEP.    Status On-going   OT SHORT TERM GOAL #2   Title Pt will return to highest level of function and independence in B/IADL tasks.    Status On-going   OT SHORT TERM GOAL #3   Title Pt will decrease pain to 2/10 or less during daily & leisure tasks.    Status On-going   OT SHORT TERM GOAL #4   Title Pt will increase range of motion to WNL to increase ability to fasten bra.    Status On-going   OT SHORT TERM GOAL #5   Title Pt will increase strength to 4+/5 to increase ability to lift and lower items out of high cabinets.    Status On-going   OT SHORT TERM GOAL #6   Title Pt will decrease fascial restrictions to min amounts or less in RUE.    Status On-going                  Plan - 10/16/14 1621    Clinical Impression Statement A: Added 1# weight to supine and seated exercises, scapular theraband exercises this session. Pt demonstrates good form during all exercises this session. Provided pt with scapular theraband HEP.    Plan P: Add x to v arms,  UBE. Follow up on scapular theraband HEP.         Problem List Patient Active Problem List   Diagnosis Date Noted  . Obese 08/26/2014  . S/P left TKA 08/25/2014  . S/P knee replacement 08/25/2014  . Varicose veins of leg with complications 67/61/9509  . FATIGUE / MALAISE 12/29/2008  . DYSPNEA 12/29/2008  . ABNORMAL CV (STRESS) TEST 12/29/2008  . DEPRESSION 12/21/2008  . HYPERTENSION 12/21/2008    Guadelupe Sabin, OTR/L   [phone]  10/16/2014, 4:23 PM  [facility] [address] [place], [place], [address] Phone: [phone]   Fax:  [phone]

## 2014-10-16 NOTE — Patient Instructions (Signed)

## 2014-10-20 ENCOUNTER — Encounter (HOSPITAL_COMMUNITY): Payer: Self-pay | Admitting: Occupational Therapy

## 2014-10-20 ENCOUNTER — Ambulatory Visit (HOSPITAL_COMMUNITY): Payer: Medicare Other | Attending: Orthopedic Surgery | Admitting: Occupational Therapy

## 2014-10-20 DIAGNOSIS — M629 Disorder of muscle, unspecified: Secondary | ICD-10-CM | POA: Insufficient documentation

## 2014-10-20 DIAGNOSIS — M25611 Stiffness of right shoulder, not elsewhere classified: Secondary | ICD-10-CM | POA: Diagnosis present

## 2014-10-20 DIAGNOSIS — R29898 Other symptoms and signs involving the musculoskeletal system: Secondary | ICD-10-CM

## 2014-10-20 DIAGNOSIS — M25511 Pain in right shoulder: Secondary | ICD-10-CM

## 2014-10-20 DIAGNOSIS — M6289 Other specified disorders of muscle: Secondary | ICD-10-CM

## 2014-10-20 NOTE — Therapy (Signed)
[facility] [place], [place], [address] Phone: [phone]   Fax:  [phone]  Occupational Therapy Treatment  Patient Details  Name: Kim Dixon MRN: [id number] Date of Birth: 08/23/1940 Referring Provider:  Lajean Manes, PA-C  Encounter Date: 10/20/2014      OT End of Session - 10/20/14 1151    Visit Number 4   Number of Visits 8   Date for OT Re-Evaluation 12/07/14  Mini reassessment8/18/2016   Authorization Type UHC Medicare   Authorization Time Period before 10th visit   Authorization - Visit Number 4   Authorization - Number of Visits 10   OT Start Time 1107   OT Stop Time 1154   OT Time Calculation (min) 47 min   Activity Tolerance Patient tolerated treatment well   Behavior During Therapy [facility] for tasks assessed/performed      Past Medical History  Diagnosis Date  . Depression   . Hypertension   . Hyperlipidemia   . Glaucoma   . Varicose veins   . Heart murmur   . Pneumonia     hx of   . Anxiety   . Kidney stone     03/2014   . Urinary frequency   . Stress incontinence   . Arthritis     Past Surgical History  Procedure Laterality Date  . Abdominal hysterectomy    . Colonoscopy    . Colonoscopy N/A 06/18/2013    Procedure: COLONOSCOPY;  Surgeon: Rogene Houston, MD;  [facility];  Service: Endoscopy;  Laterality: N/A;  830  . Lithotripsy    . Ablation saphenous vein w/ rfa      right leg  . Eye surgery      laser surgery bilat   . Tonsillectomy    . Total knee arthroplasty Left 08/25/2014    Procedure: LEFT TOTAL KNEE ARTHROPLASTY;  Surgeon: Paralee Cancel, MD;  Location: [facility];  Service: Orthopedics;  Laterality: Left;    There were no vitals filed for this visit.  Visit Diagnosis:  Tight fascia  Right arm weakness  Shoulder joint stiffness, right  Decreased range of motion of right shoulder  Pain in right shoulder      Subjective Assessment - 10/20/14 1107     Subjective  S: I had trouble this morning in a bathroom at the dentist office because the counter was so high.    Currently in Pain? 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   Precautions   Precautions None                  OT Treatments/Exercises (OP) - 10/20/14 1126    Exercises   Exercises Shoulder   Shoulder Exercises: Supine   Protraction PROM;5 reps;Strengthening;12 reps   Protraction Weight (lbs) 1   Horizontal ABduction PROM;5 reps;Strengthening;12 reps   Horizontal ABduction Weight (lbs) 1   External Rotation PROM;5 reps;Strengthening;12 reps   External Rotation Weight (lbs) 1   Internal Rotation PROM;5 reps;Strengthening;12 reps   Internal Rotation Weight (lbs) 1   Flexion PROM;5 reps;Strengthening;12 reps   Shoulder Flexion Weight (lbs) 1   ABduction PROM;5 reps;Strengthening;12 reps   Shoulder ABduction Weight (lbs) 1   Shoulder Exercises: Seated   Protraction Strengthening;10 reps   Protraction Weight (lbs) 1   Horizontal ABduction Strengthening;10 reps  Horizontal ABduction Weight (lbs) 1   External Rotation Strengthening;10 reps   External Rotation Weight (lbs) 1   Internal Rotation Strengthening;10 reps   Internal Rotation Weight (lbs) 1   Flexion Strengthening;10 reps   Flexion Weight (lbs) 1   Abduction Strengthening;10 reps   ABduction Weight (lbs) 1   Shoulder Exercises: ROM/Strengthening   UBE (Upper Arm Bike) Level 1 2' forward 2' reverse   X to V Arms 10X    Proximal Shoulder Strengthening, Supine 12X no rest breaks   Proximal Shoulder Strengthening, Seated 12X no rest breaks   Manual Therapy   Manual Therapy Myofascial release   Myofascial Release Myofascial release to right upper arm, trapezious, and scapularis region to decrease fascial restrictions and increase joint mobility in a pain free zone.                    OT Short Term Goals - 10/13/14 1458    OT SHORT TERM GOAL #1   Title Pt will be educated on and independent in HEP.    Status On-going   OT SHORT TERM GOAL #2   Title Pt will return to highest level of function and independence in B/IADL tasks.    Status On-going   OT SHORT TERM GOAL #3   Title Pt will decrease pain to 2/10 or less during daily & leisure tasks.    Status On-going   OT SHORT TERM GOAL #4   Title Pt will increase range of motion to WNL to increase ability to fasten bra.    Status On-going   OT SHORT TERM GOAL #5   Title Pt will increase strength to 4+/5 to increase ability to lift and lower items out of high cabinets.    Status On-going   OT SHORT TERM GOAL #6   Title Pt will decrease fascial restrictions to min amounts or less in RUE.    Status On-going                  Plan - 10/20/14 1154    Clinical Impression Statement A: Added x to v arms, UBE this session. Pt reports she completed scapular theraband exercises one time at home and they went well. Pt reports minimal discomfort during exercises this session.    Plan P: Resume scapular theraband, increase seated reps to 12. Follow up on home exercises.         Problem List Patient Active Problem List   Diagnosis Date Noted  . Obese 08/26/2014  . S/P left TKA 08/25/2014  . S/P knee replacement 08/25/2014  . Varicose veins of leg with complications 54/65/0354  . FATIGUE / MALAISE 12/29/2008  . DYSPNEA 12/29/2008  . ABNORMAL CV (STRESS) TEST 12/29/2008  . DEPRESSION 12/21/2008  . HYPERTENSION 12/21/2008    Guadelupe Sabin, OTR/L  [phone]  10/20/2014, 11:56 AM  [facility] [place], [place], [address] Phone: [phone]   Fax:  [phone]

## 2014-10-23 ENCOUNTER — Ambulatory Visit (HOSPITAL_COMMUNITY): Payer: Medicare Other

## 2014-10-23 ENCOUNTER — Encounter (HOSPITAL_COMMUNITY): Payer: Self-pay

## 2014-10-23 DIAGNOSIS — R29898 Other symptoms and signs involving the musculoskeletal system: Secondary | ICD-10-CM

## 2014-10-23 DIAGNOSIS — M629 Disorder of muscle, unspecified: Secondary | ICD-10-CM | POA: Diagnosis not present

## 2014-10-23 DIAGNOSIS — M6289 Other specified disorders of muscle: Secondary | ICD-10-CM

## 2014-10-23 DIAGNOSIS — M25611 Stiffness of right shoulder, not elsewhere classified: Secondary | ICD-10-CM

## 2014-10-23 NOTE — Therapy (Signed)
[place], [place], [address] Phone: [phone]   Fax:  [phone]  Occupational Therapy Treatment  Patient Details  Name: Kim Dixon MRN: [id number] Date of Birth: [birth year] Referring Provider:  Lajean Manes, PA-C  Encounter Date: 10/23/2014      OT End of Session - 10/23/14 1045    Visit Number 5   Number of Visits 8   Date for OT Re-Evaluation 12/07/14  Mini reassessment8/18/2016   Authorization Type [employer] Medicare   Authorization Time Period before 10th visit   Authorization - Visit Number 5   Authorization - Number of Visits 10   OT Start Time 4235   OT Stop Time 1020   OT Time Calculation (min) 45 min   Activity Tolerance Patient tolerated treatment well   Behavior During Therapy [facility] for tasks assessed/performed      Past Medical History  Diagnosis Date  . Depression   . Hypertension   . Hyperlipidemia   . Glaucoma   . Varicose veins   . Heart murmur   . Pneumonia     hx of   . Anxiety   . Kidney stone     03/2014   . Urinary frequency   . Stress incontinence   . Arthritis     Past Surgical History  Procedure Laterality Date  . Abdominal hysterectomy    . Colonoscopy    . Colonoscopy N/A 06/18/2013    Procedure: COLONOSCOPY;  Surgeon: Rogene Houston, MD;  [facility];  Service: Endoscopy;  Laterality: N/A;  830  . Lithotripsy    . Ablation saphenous vein w/ rfa      right leg  . Eye surgery      laser surgery bilat   . Tonsillectomy    . Total knee arthroplasty Left 08/25/2014    Procedure: LEFT TOTAL KNEE ARTHROPLASTY;  Surgeon: Paralee Cancel, MD;  Location: [facility];  Service: Orthopedics;  Laterality: Left;    There were no vitals filed for this visit.  Visit Diagnosis:  Tight fascia  Right arm weakness  Shoulder joint stiffness, right      Subjective Assessment - 10/23/14 0951    Subjective  S: I was using 4# weights in silver sneakers the other  day. I had trouble with some movements.   Currently in Pain? 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   Precautions   Precautions None                  OT Treatments/Exercises (OP) - 10/23/14 0953    Exercises   Exercises Shoulder   Shoulder Exercises: Supine   Protraction PROM;5 reps;Strengthening;12 reps   Protraction Weight (lbs) 2   Horizontal ABduction PROM;5 reps;Strengthening;12 reps   Horizontal ABduction Weight (lbs) 2   External Rotation PROM;5 reps;Strengthening;12 reps   External Rotation Weight (lbs) 2   Internal Rotation PROM;5 reps;Strengthening;12 reps   Internal Rotation Weight (lbs) 2   Flexion PROM;5 reps;Strengthening;12 reps   Shoulder Flexion Weight (lbs) 2   ABduction PROM;5 reps;Strengthening;12 reps   Shoulder ABduction Weight (lbs) 2   Shoulder Exercises: Seated   Protraction Strengthening;12 reps   Protraction Weight (lbs) 2   Horizontal ABduction Strengthening;12 reps   Horizontal ABduction Weight (lbs) 2   External Rotation Strengthening;12 reps   External  Rotation Weight (lbs) 2   Internal Rotation Strengthening;12 reps   Internal Rotation Weight (lbs) 2   Flexion Strengthening;12 reps   Flexion Weight (lbs) 2   Abduction Strengthening;12 reps   ABduction Weight (lbs) 2   Shoulder Exercises: Standing   Extension Theraband;12 reps   Theraband Level (Shoulder Extension) Level 2 (Red)   Row Theraband;12 reps   Theraband Level (Shoulder Row) Level 2 (Red)   Retraction Theraband;12 reps   Theraband Level (Shoulder Retraction) Level 2 (Red)   Shoulder Exercises: ROM/Strengthening   X to V Arms 15X   Proximal Shoulder Strengthening, Supine 12X 2# no rest breaks   Manual Therapy   Manual Therapy Myofascial release   Myofascial Release Myofascial release to right upper arm, trapezious, and scapularis region to decrease fascial  restrictions and increase joint mobility in a pain free zone.                   OT Short Term Goals - 10/13/14 1458    OT SHORT TERM GOAL #1   Title Pt will be educated on and independent in HEP.    Status On-going   OT SHORT TERM GOAL #2   Title Pt will return to highest level of function and independence in B/IADL tasks.    Status On-going   OT SHORT TERM GOAL #3   Title Pt will decrease pain to 2/10 or less during daily & leisure tasks.    Status On-going   OT SHORT TERM GOAL #4   Title Pt will increase range of motion to WNL to increase ability to fasten bra.    Status On-going   OT SHORT TERM GOAL #5   Title Pt will increase strength to 4+/5 to increase ability to lift and lower items out of high cabinets.    Status On-going   OT SHORT TERM GOAL #6   Title Pt will decrease fascial restrictions to min amounts or less in RUE.    Status On-going                  Plan - 10/23/14 1045    Clinical Impression Statement A: Resumed scapular theraband exercises, increased supine and seated repetitions to 12X, increased weight to 2#. Pt states that she is completing her HEP at home when she is able to. [facility] [employer] [facility] and is actively working her shoulders in class.    Plan P: Add ball on the wall and add weight to X to V arms.        Problem List Patient Active Problem List   Diagnosis Date Noted  . Obese 08/26/2014  . S/P left TKA 08/25/2014  . S/P knee replacement 08/25/2014  . Varicose veins of leg with complications 08/65/7846  . FATIGUE / MALAISE 12/29/2008  . DYSPNEA 12/29/2008  . ABNORMAL CV (STRESS) TEST 12/29/2008  . DEPRESSION 12/21/2008  . HYPERTENSION 12/21/2008    Ailene Ravel, OTR/L,CBIS  [phone]  10/23/2014, 10:48 AM  [place] [address] [place], [place], [address] Phone: [phone]   Fax:  [phone]

## 2014-10-26 ENCOUNTER — Encounter (HOSPITAL_COMMUNITY): Payer: Medicare Other | Admitting: Occupational Therapy

## 2014-10-28 ENCOUNTER — Encounter (HOSPITAL_COMMUNITY): Payer: Medicare Other

## 2014-11-03 ENCOUNTER — Encounter (HOSPITAL_COMMUNITY): Payer: Self-pay

## 2014-11-03 ENCOUNTER — Ambulatory Visit (HOSPITAL_COMMUNITY): Payer: Medicare Other

## 2014-11-03 DIAGNOSIS — M629 Disorder of muscle, unspecified: Secondary | ICD-10-CM

## 2014-11-03 DIAGNOSIS — M6289 Other specified disorders of muscle: Secondary | ICD-10-CM

## 2014-11-03 DIAGNOSIS — R29898 Other symptoms and signs involving the musculoskeletal system: Secondary | ICD-10-CM

## 2014-11-03 DIAGNOSIS — M25611 Stiffness of right shoulder, not elsewhere classified: Secondary | ICD-10-CM

## 2014-11-03 NOTE — Therapy (Signed)
[place], [place], [address] Phone: [phone]   Fax:  [phone]  Occupational Therapy Treatment  Patient Details  Name: Kim Dixon MRN: [id number] Date of Birth: 1940-10-30 Referring Provider:  Lajean Manes, PA-C  Encounter Date: 11/03/2014      OT End of Session - 11/03/14 0845    Visit Number 6   Number of Visits 8   Date for OT Re-Evaluation 12/07/14  Mini reassessment8/18/2016   Authorization Type [employer] Medicare   Authorization Time Period before 10th visit   Authorization - Visit Number 6   Authorization - Number of Visits 10   OT Start Time 0807   OT Stop Time 0845   OT Time Calculation (min) 38 min   Activity Tolerance Patient tolerated treatment well   Behavior During Therapy [facility] for tasks assessed/performed      Past Medical History  Diagnosis Date  . Depression   . Hypertension   . Hyperlipidemia   . Glaucoma   . Varicose veins   . Heart murmur   . Pneumonia     hx of   . Anxiety   . Kidney stone     03/2014   . Urinary frequency   . Stress incontinence   . Arthritis     Past Surgical History  Procedure Laterality Date  . Abdominal hysterectomy    . Colonoscopy    . Colonoscopy N/A 06/18/2013    Procedure: COLONOSCOPY;  Surgeon: Rogene Houston, MD;  [facility];  Service: Endoscopy;  Laterality: N/A;  830  . Lithotripsy    . Ablation saphenous vein w/ rfa      right leg  . Eye surgery      laser surgery bilat   . Tonsillectomy    . Total knee arthroplasty Left 08/25/2014    Procedure: LEFT TOTAL KNEE ARTHROPLASTY;  Surgeon: Paralee Cancel, MD;  [facility];  Service: Orthopedics;  Laterality: Left;    There were no vitals filed for this visit.  Visit Diagnosis:  Tight fascia  Right arm weakness  Shoulder joint stiffness, right      Subjective Assessment - 11/03/14 0824    Subjective  S: The only time my shoulder hurts is when I'm pushing  off of something trying to stand. Like in the bathroom.   Currently in Pain? 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   Precautions   Precautions None                  OT Treatments/Exercises (OP) - 11/03/14 0824    Exercises   Exercises Shoulder   Shoulder Exercises: Supine   Protraction PROM;5 reps;Strengthening;12 reps   Protraction Weight (lbs) 2   Horizontal ABduction PROM;5 reps;Strengthening;12 reps   Horizontal ABduction Weight (lbs) 2   External Rotation PROM;5 reps;Strengthening;12 reps   External Rotation Weight (lbs) 2   Internal Rotation PROM;5 reps;Strengthening;12 reps   Internal Rotation Weight (lbs) 2   Flexion PROM;5 reps;Strengthening;12 reps   Shoulder Flexion Weight (lbs) 2   ABduction PROM;5 reps;AROM;12 reps   Shoulder ABduction Weight (lbs) --   Shoulder Exercises: Seated   Protraction Strengthening;12 reps   Protraction Weight (lbs) 2   Horizontal ABduction Strengthening;12 reps   Horizontal ABduction Weight (lbs) 2   External Rotation Strengthening;12 reps  External Rotation Weight (lbs) 2   Internal Rotation Strengthening;12 reps   Internal Rotation Weight (lbs) 2   Flexion Strengthening;12 reps   Flexion Weight (lbs) 2   Abduction Strengthening;12 reps   ABduction Weight (lbs) 2   Shoulder Exercises: ROM/Strengthening   UBE (Upper Arm Bike) Level 1 2' forward 2' reverse   X to V Arms 12X with 2#   Proximal Shoulder Strengthening, Supine 12X 2# no rest breaks   Proximal Shoulder Strengthening, Seated 10X with 2#   Ball on Wall 1' flexion 1' abduction green ball   Manual Therapy   Manual Therapy Myofascial release   Myofascial Release Myofascial release to right upper arm, trapezious, and scapularis region to decrease fascial restrictions and increase joint mobility in a pain free zone.                   OT Short  Term Goals - 10/13/14 1458    OT SHORT TERM GOAL #1   Title Pt will be educated on and independent in HEP.    Status On-going   OT SHORT TERM GOAL #2   Title Pt will return to highest level of function and independence in B/IADL tasks.    Status On-going   OT SHORT TERM GOAL #3   Title Pt will decrease pain to 2/10 or less during daily & leisure tasks.    Status On-going   OT SHORT TERM GOAL #4   Title Pt will increase range of motion to WNL to increase ability to fasten bra.    Status On-going   OT SHORT TERM GOAL #5   Title Pt will increase strength to 4+/5 to increase ability to lift and lower items out of high cabinets.    Status On-going   OT SHORT TERM GOAL #6   Title Pt will decrease fascial restrictions to min amounts or less in RUE.    Status On-going                  Plan - 11/03/14 0846    Clinical Impression Statement A: Added weight to X to V arms and added ball on the wall. patient tolerated well. Did not use 2# with supine abduction due to pain.    Plan P: Reassess. Update G code and write progress note.         Problem List Patient Active Problem List   Diagnosis Date Noted  . Obese 08/26/2014  . S/P left TKA 08/25/2014  . S/P knee replacement 08/25/2014  . Varicose veins of leg with complications 17/49/4496  . FATIGUE / MALAISE 12/29/2008  . DYSPNEA 12/29/2008  . ABNORMAL CV (STRESS) TEST 12/29/2008  . DEPRESSION 12/21/2008  . HYPERTENSION 12/21/2008    Ailene Ravel, OTR/L,CBIS  [phone]  11/03/2014, 8:51 AM  [place], [place], [address] Phone: [phone]   Fax:  [phone]

## 2014-11-05 ENCOUNTER — Encounter (HOSPITAL_COMMUNITY): Payer: Self-pay

## 2014-11-05 ENCOUNTER — Ambulatory Visit (HOSPITAL_COMMUNITY): Payer: Medicare Other

## 2014-11-05 DIAGNOSIS — M25511 Pain in right shoulder: Secondary | ICD-10-CM

## 2014-11-05 DIAGNOSIS — M25611 Stiffness of right shoulder, not elsewhere classified: Secondary | ICD-10-CM

## 2014-11-05 DIAGNOSIS — M629 Disorder of muscle, unspecified: Secondary | ICD-10-CM

## 2014-11-05 DIAGNOSIS — M6289 Other specified disorders of muscle: Secondary | ICD-10-CM

## 2014-11-05 DIAGNOSIS — R29898 Other symptoms and signs involving the musculoskeletal system: Secondary | ICD-10-CM

## 2014-11-05 NOTE — Therapy (Signed)
[place], [place], [address] Phone: [phone]   Fax:  [phone]  Occupational Therapy Treatment  Patient Details  Name: Kim Dixon MRN: [id number] Date of Birth: [birth year] Referring Provider:  Lajean Manes, PA-C  Encounter Date: 11/05/2014      OT End of Session - 11/05/14 1043    Visit Number 7   Number of Visits 9   Date for OT Re-Evaluation 12/07/14   Authorization Type [employer] Medicare   Authorization Time Period before 17th visit   Authorization - Visit Number 7   Authorization - Number of Visits 17   OT Start Time 0930   OT Stop Time 1016   OT Time Calculation (min) 46 min   Activity Tolerance Patient tolerated treatment well   Behavior During Therapy [facility] for tasks assessed/performed      Past Medical History  Diagnosis Date  . Depression   . Hypertension   . Hyperlipidemia   . Glaucoma   . Varicose veins   . Heart murmur   . Pneumonia     hx of   . Anxiety   . Kidney stone     03/2014   . Urinary frequency   . Stress incontinence   . Arthritis     Past Surgical History  Procedure Laterality Date  . Abdominal hysterectomy    . Colonoscopy    . Colonoscopy N/A 06/18/2013    Procedure: COLONOSCOPY;  Surgeon: Rogene Houston, MD;  [facility];  Service: Endoscopy;  Laterality: N/A;  830  . Lithotripsy    . Ablation saphenous vein w/ rfa      right leg  . Eye surgery      laser surgery bilat   . Tonsillectomy    . Total knee arthroplasty Left 08/25/2014    Procedure: LEFT TOTAL KNEE ARTHROPLASTY;  Surgeon: Paralee Cancel, MD;  Location: [facility];  Service: Orthopedics;  Laterality: Left;    There were no vitals filed for this visit.  Visit Diagnosis:  Tight fascia  Right arm weakness  Shoulder joint stiffness, right  Pain in right shoulder      Subjective Assessment - 11/05/14 1016    Subjective  S: The pain I used to feel in the beginning has turned  into soreness.   Special Tests FOTO score: 63/100 (37% impaired)   Currently in Pain? 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   Precautions   Precautions None   AROM   Overall AROM Comments Assessed in standing, ER/IR adducted   AROM Assessment Site Shoulder   Right/Left Shoulder Right   Right Shoulder Flexion 171 Degrees  on eval: 158   Right Shoulder ABduction 171 Degrees  on eval: 152   Right Shoulder Internal Rotation 90 Degrees  on eval: 74   Right Shoulder External Rotation 90 Degrees  on eval: 90   Strength   Overall Strength Comments Assessed seated, ER/IR adducted   Strength Assessment Site Shoulder   Right/Left Shoulder Right   Right Shoulder Flexion 4+/5  on eval: 4/5   Right Shoulder ABduction 4/5  same at eval   Right Shoulder Internal  Rotation 4+/5  on eval: 4-/5   Right Shoulder External Rotation 4+/5  on eval: 4-/5                  OT Treatments/Exercises (OP) - 25-Nov-2014 1017    Exercises   Exercises Shoulder   Shoulder Exercises: Supine   Protraction PROM;5 reps   Horizontal ABduction PROM;5 reps   External Rotation PROM;5 reps   Internal Rotation PROM;5 reps   Flexion PROM;5 reps   ABduction PROM;5 reps   Shoulder Exercises: Stretch   Wall Stretch - ABduction 3 reps;10 seconds   Other Shoulder Stretches Levator scapular stretches; 3 sets; 10" hold   Manual Therapy   Manual Therapy Myofascial release   Myofascial Release Myofascial release to right upper arm, trapezious, and scapularis region to decrease fascial restrictions and increase joint mobility in a pain free zone.                 OT Education - 11-25-2014 1041    Education provided Yes   Education Details abductiona and scapular levator  stretch   Person(s) Educated Patient   Methods Explanation;Demonstration;Handout   Comprehension Returned demonstration;Verbalized understanding          OT Short Term Goals - 11/25/2014 1000    OT SHORT TERM GOAL #1   Title Pt will be educated on and independent in [place].    Status Achieved   OT SHORT TERM GOAL #2   Title Pt will return to highest level of function and independence in B/IADL tasks.    Status Achieved   OT SHORT TERM GOAL #3   Title Pt will decrease pain to 2/10 or less during daily & leisure tasks.    Status On-going   OT SHORT TERM GOAL #4   Title Pt will increase range of motion to WNL to increase ability to fasten bra.    Status Achieved   OT SHORT TERM GOAL #5   Title Pt will increase strength to 4+/5 to increase ability to lift and lower items out of high cabinets.    Status Partially Met   OT SHORT TERM GOAL #6   Title Pt will decrease fascial restrictions to min amounts or less in RUE.    Status On-going                  Plan - Nov 25, 2014 1044    Clinical Impression Statement A: Reassessment completed this date. Pt met 3/6 STGs. Pt states that the pain she was initially feeling at the start of therapy is now a sore feeling. Patient has WNL measurements for AROM. Strength has improved in all areas except abduction. Pt reports a pain/catching feeling when completing abduction at the typical painful range with shoulder impingement. Discussed the diagnosis of OA and shoulder impingement and prognosis. Recommend pt complete 1 more week of therapy for the prescribed 4 weeks that  the MD ordered.    Plan P: Continue to work on decreasing trigger point on right upper trapezius region, increase abduction strength, decrease pain leve during abduction.          G-Codes - 11/25/14 1044    Functional Assessment Tool Used FOTO score: 63/100 (37% impaired)   Functional Limitation Carrying, moving and handling objects   Carrying, Moving and Handling Objects  Current Status (K5993) At least 20 percent but less than 40 percent impaired, limited or restricted   Carrying, Moving and Handling Objects Goal Status (T7017) At least 1 percent but less  than 20 percent impaired, limited or restricted      Problem List Patient Active Problem List   Diagnosis Date Noted  . Obese 08/26/2014  . S/P left TKA 08/25/2014  . S/P knee replacement 08/25/2014  . Varicose veins of leg with complications 16/38/4665  . FATIGUE / MALAISE 12/29/2008  . DYSPNEA 12/29/2008  . ABNORMAL CV (STRESS) TEST 12/29/2008  . DEPRESSION 12/21/2008  . HYPERTENSION 12/21/2008   Occupational Therapy Progress Note  Dates of Reporting Period: 10/08/14 to 11/05/14  Objective Reports of Subjective Statement: See clinical impression statement above  Objective Measurements: See measurements above  Goal Update: No updates to goals. Patient has met 3/6 STGs and is progressing towards remainder.  Plan: See plan above  Reason Skilled Services are Required: Skilled OT services needed to increase functional performance during daily and leisure activities using RUE.   Ailene Ravel, OTR/L,CBIS  [phone]  11/05/2014, 10:52 AM  [place], [place], [address] Phone: [phone]   Fax:  [phone]

## 2014-11-05 NOTE — Patient Instructions (Signed)
Closed Chain: Shoulder Abduction / Adduction - on Wall   One hand on wall, step to side and return. Stepping causes shoulder to abduct and adduct. Hold 10 seconds. Repeat 3 times.    Levator Scapula Stretch, Sitting   Sit, one hand tucked under hip on side to be stretched, other hand over top of head. Turn head toward other side and look down. Use hand on head to gently stretch neck in that position. Hold _10__ seconds. Repeat __3_ times per session. Do ___ sessions per day.  Copyright  VHI. All rights reserved.

## 2014-11-10 ENCOUNTER — Ambulatory Visit (HOSPITAL_COMMUNITY): Payer: Medicare Other

## 2014-11-10 ENCOUNTER — Encounter (HOSPITAL_COMMUNITY): Payer: Self-pay

## 2014-11-10 DIAGNOSIS — M6289 Other specified disorders of muscle: Secondary | ICD-10-CM

## 2014-11-10 DIAGNOSIS — M629 Disorder of muscle, unspecified: Secondary | ICD-10-CM

## 2014-11-10 DIAGNOSIS — R29898 Other symptoms and signs involving the musculoskeletal system: Secondary | ICD-10-CM

## 2014-11-10 DIAGNOSIS — M25611 Stiffness of right shoulder, not elsewhere classified: Secondary | ICD-10-CM

## 2014-11-10 NOTE — Therapy (Signed)
[place], [place], [address] Phone: [phone]   Fax:  [phone]  Occupational Therapy Treatment  Patient Details  Name: Kim Dixon MRN: [id number] Date of Birth: Aug 06, 1940 Referring Provider:  Lajean Manes, PA-C  Encounter Date: 11/10/2014      OT End of Session - 11/10/14 0843    Visit Number 8   Number of Visits 9   Date for OT Re-Evaluation 12/07/14   Authorization Type [employer] Medicare   Authorization Time Period before 17th visit   Authorization - Visit Number 8   Authorization - Number of Visits 17   OT Start Time [phone]   OT Stop Time 0845   OT Time Calculation (min) 38 min   Activity Tolerance Patient tolerated treatment well   Behavior During Therapy [facility] for tasks assessed/performed      Past Medical History  Diagnosis Date  . Depression   . Hypertension   . Hyperlipidemia   . Glaucoma   . Varicose veins   . Heart murmur   . Pneumonia     hx of   . Anxiety   . Kidney stone     03/2014   . Urinary frequency   . Stress incontinence   . Arthritis     Past Surgical History  Procedure Laterality Date  . Abdominal hysterectomy    . Colonoscopy    . Colonoscopy N/A 06/18/2013    Procedure: COLONOSCOPY;  Surgeon: Rogene Houston, MD;  [facility];  Service: Endoscopy;  Laterality: N/A;  830  . Lithotripsy    . Ablation saphenous vein w/ rfa      right leg  . Eye surgery      laser surgery bilat   . Tonsillectomy    . Total knee arthroplasty Left 08/25/2014    Procedure: LEFT TOTAL KNEE ARTHROPLASTY;  Surgeon: Paralee Cancel, MD;  [facility];  Service: Orthopedics;  Laterality: Left;    There were no vitals filed for this visit.  Visit Diagnosis:  Tight fascia  Right arm weakness  Shoulder joint stiffness, right      Subjective Assessment - 11/10/14 0817    Subjective  S: My daughter told about using a ball on my muscle knot and leaning against the  wall to try and get it out.    Currently in Pain? 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   Precautions   Precautions None                  OT Treatments/Exercises (OP) - 11/10/14 0818    Exercises   Exercises Shoulder   Shoulder Exercises: Supine   Protraction PROM;5 reps;Strengthening;20 reps   Protraction Weight (lbs) 2   Horizontal ABduction PROM;5 reps;Strengthening;20 reps   Horizontal ABduction Weight (lbs) 2   External Rotation PROM;5 reps;Strengthening;20 reps   External Rotation Weight (lbs) 2   Internal Rotation PROM;5 reps;Strengthening;20 reps   Internal Rotation Weight (lbs) 2   Flexion PROM;5 reps;Strengthening;20 reps   Shoulder Flexion Weight (lbs) 2   ABduction PROM;5 reps  Not completed with weight due to pain   Shoulder ABduction Weight (lbs) --   Shoulder Exercises: Seated   Protraction Strengthening;20 reps   Protraction Weight (lbs) 2   Horizontal ABduction Strengthening;20 reps   Horizontal ABduction Weight (lbs)  2   External Rotation Strengthening;20 reps   External Rotation Weight (lbs) 2   Internal Rotation Strengthening;20 reps   Internal Rotation Weight (lbs) 2   Flexion Strengthening;20 reps   Flexion Weight (lbs) 2   Abduction Strengthening;20 reps   ABduction Weight (lbs) 2   Shoulder Exercises: Standing   Horizontal ABduction Theraband;12 reps   Theraband Level (Shoulder Horizontal ABduction) Level 3 (Green)   ABduction Theraband;10 reps   Theraband Level (Shoulder ABduction) Level 2 (Red)   Shoulder Exercises: ROM/Strengthening   UBE (Upper Arm Bike) Level 1 2' forward 2' reverse   X to V Arms 12X with 2#   Proximal Shoulder Strengthening, Supine 20X with 2# no rest breaks   Proximal Shoulder Strengthening, Seated 10X with 2# with rest breaks to keep proper form   Manual Therapy   Manual Therapy Myofascial release    Myofascial Release Myofascial release to right upper arm, trapezious, and scapularis region to decrease fascial restrictions and increase joint mobility in a pain free zone.                   OT Short Term Goals - 11/05/14 1000    OT SHORT TERM GOAL #1   Title Pt will be educated on and independent in HEP.    Status Achieved   OT SHORT TERM GOAL #2   Title Pt will return to highest level of function and independence in B/IADL tasks.    Status Achieved   OT SHORT TERM GOAL #3   Title Pt will decrease pain to 2/10 or less during daily & leisure tasks.    Status On-going   OT SHORT TERM GOAL #4   Title Pt will increase range of motion to WNL to increase ability to fasten bra.    Status Achieved   OT SHORT TERM GOAL #5   Title Pt will increase strength to 4+/5 to increase ability to lift and lower items out of high cabinets.    Status Partially Met   OT SHORT TERM GOAL #6   Title Pt will decrease fascial restrictions to min amounts or less in RUE.    Status On-going                  Plan - 11/10/14 0712    Clinical Impression Statement A: Increased reps during supine exercises. Added theraband exercises to focus on abduction. Pt tolerated well and required vc's for proper form during exercises.    Plan P: Attempt 3# weight supine. Cont with strengthening in abduction. Resume ball on the wall. MFR PRN. D/C PROM stretches.        Problem List Patient Active Problem List   Diagnosis Date Noted  . Obese 08/26/2014  . S/P left TKA 08/25/2014  . S/P knee replacement 08/25/2014  . Varicose veins of leg with complications 19/75/8832  . FATIGUE / MALAISE 12/29/2008  . DYSPNEA 12/29/2008  . ABNORMAL CV (STRESS) TEST 12/29/2008  . DEPRESSION 12/21/2008  . HYPERTENSION 12/21/2008    Ailene Ravel, OTR/L,CBIS  [phone]  11/10/2014, 8:46 AM  [place], [place], [address] Phone:  [phone]   Fax:  [phone]

## 2014-11-13 ENCOUNTER — Ambulatory Visit (HOSPITAL_COMMUNITY): Payer: Medicare Other

## 2014-11-13 ENCOUNTER — Encounter (HOSPITAL_COMMUNITY): Payer: Self-pay

## 2014-11-13 DIAGNOSIS — M629 Disorder of muscle, unspecified: Secondary | ICD-10-CM | POA: Diagnosis not present

## 2014-11-13 DIAGNOSIS — R29898 Other symptoms and signs involving the musculoskeletal system: Secondary | ICD-10-CM

## 2014-11-13 NOTE — Therapy (Addendum)
[place], [place], [address] Phone: [phone]   Fax:  [phone]  Occupational Therapy Treatment  Patient Details  Name: Kim Dixon MRN: [id number] Date of Birth: 12/29/1940 Referring Provider:  Lajean Manes, PA-C  Encounter Date: 11/13/2014      OT End of Session - 11/13/14 0845    Visit Number 9   Number of Visits 9   Authorization Type [employer]   Authorization Time Period before 17th visit   Authorization - Visit Number 9   Authorization - Number of Visits 17   OT Start Time 0807   OT Stop Time 0845   OT Time Calculation (min) 38 min   Activity Tolerance Patient tolerated treatment well   Behavior During Therapy [facility] for tasks assessed/performed      Past Medical History  Diagnosis Date  . Depression   . Hypertension   . Hyperlipidemia   . Glaucoma   . Varicose veins   . Heart murmur   . Pneumonia     hx of   . Anxiety   . Kidney stone     03/2014   . Urinary frequency   . Stress incontinence   . Arthritis     Past Surgical History  Procedure Laterality Date  . Abdominal hysterectomy    . Colonoscopy    . Colonoscopy N/A 06/18/2013    Procedure: COLONOSCOPY;  Surgeon: Rogene Houston, MD;  Location: [facility];  Service: Endoscopy;  Laterality: N/A;  830  . Lithotripsy    . Ablation saphenous vein w/ rfa      right leg  . Eye surgery      laser surgery bilat   . Tonsillectomy    . Total knee arthroplasty Left 08/25/2014    Procedure: LEFT TOTAL KNEE ARTHROPLASTY;  Surgeon: Paralee Cancel, MD;  Location: [facility];  Service: Orthopedics;  Laterality: Left;    There were no vitals filed for this visit.  Visit Diagnosis:  Right arm weakness      Subjective Assessment - 11/13/14 0821    Subjective  S: I don't have a bad pain in my arm. It's just certain things I do. But it's nothing that stops me from doing things.    Special Tests FOTO score: 69/100 (31% impaired)    Currently in Pain? 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   Precautions   Precautions None   AROM   Overall AROM Comments Assessed in standing, ER/IR adducted   AROM Assessment Site Shoulder   Right/Left Shoulder Right   Right Shoulder Flexion 180 Degrees  previous: 171   Right Shoulder ABduction 180 Degrees  previous: 171   Right Shoulder Internal Rotation 90 Degrees  previous: 90   Right Shoulder External Rotation 90 Degrees  previous: 90   Strength   Overall Strength Comments Assessed seated, ER/IR adducted   Strength Assessment Site Shoulder   Right/Left Shoulder Right   Right Shoulder Flexion 4+/5  previous: 4+/5   Right Shoulder ABduction 4/5  previous: 4/5   Right Shoulder Internal Rotation 4+/5  previous: 4+/5   Right Shoulder External Rotation 5/5  previous: 4+/5                  OT Treatments/Exercises (OP) - 11/13/14 6378    Exercises  Exercises Shoulder   Shoulder Exercises: Standing   Horizontal ABduction Theraband;12 reps   Theraband Level (Shoulder Horizontal ABduction) Level 3 (Green)   External Rotation Theraband;12 reps   Theraband Level (Shoulder External Rotation) Level 3 (Green)   Internal Rotation Theraband;12 reps   Theraband Level (Shoulder Internal Rotation) Level 3 (Green)   ABduction Theraband;12 reps   Theraband Level (Shoulder ABduction) Level 3 (Green)   Other Standing Exercises Shoulder adduction; green theraband; 12X   Shoulder Exercises: ROM/Strengthening   UBE (Upper Arm Bike) Level 1 2' forward 2' reverse   Ball on Wall 1' flexion 1' abduction red ball                OT Education - 11/13/14 0844    Education provided Yes   Education Details green theraband exercises   Person(s) Educated Patient   Methods Demonstration;Explanation;Handout   Comprehension Verbalized understanding;Returned demonstration           OT Short Term Goals - 11/13/14 0817    OT SHORT TERM GOAL #1   Title Pt will be educated on and independent in HEP.    Status Achieved   OT SHORT TERM GOAL #2   Title Pt will return to highest level of function and independence in B/IADL tasks.    Status Achieved   OT SHORT TERM GOAL #3   Title Pt will decrease pain to 2/10 or less during daily & leisure tasks.    Status Partially Met   OT SHORT TERM GOAL #4   Title Pt will increase range of motion to WNL to increase ability to fasten bra.    Status Achieved   OT SHORT TERM GOAL #5   Title Pt will increase strength to 4+/5 to increase ability to lift and lower items out of high cabinets.    Status Partially Met   OT SHORT TERM GOAL #6   Title Pt will decrease fascial restrictions to min amounts or less in RUE.    Status Achieved                  Plan - 11/13/14 0846    Clinical Impression Statement A: Patient met all therapy goals except 2 that were partly met in regards to strength and pain level during daily tasks. Patient reports that everything has gotten easier since she started therapy. Pt still experiences slight pain in upper right arm when moving certain ways although she states the pain is significantly less than before. Pt's HEP was updated. Pt is in agreement with discharge.    Plan P: D/C with HEP.        Problem List Patient Active Problem List   Diagnosis Date Noted  . Obese 08/26/2014  . S/P left TKA 08/25/2014  . S/P knee replacement 08/25/2014  . Varicose veins of leg with complications 37/90/2409  . FATIGUE / MALAISE 12/29/2008  . DYSPNEA 12/29/2008  . ABNORMAL CV (STRESS) TEST 12/29/2008  . DEPRESSION 12/21/2008  . HYPERTENSION 12/21/2008   OCCUPATIONAL THERAPY DISCHARGE SUMMARY  Visits from Start of Care: 9  Current functional level related to goals / functional outcomes: See goals above   Remaining deficits: See clinical impression statement   Education /  Equipment: Nyoka Cowden theraband exercises Plan: Patient agrees to discharge.  Patient goals were met. Patient is being discharged due to meeting the stated rehab goals.  ?????        Ailene Ravel, OTR/L,CBIS  [phone]  11/13/2014, 9:16 AM  [place] [address] [place], [place], [address] Phone: [phone]   Fax:  [phone]

## 2014-11-13 NOTE — Patient Instructions (Signed)
Strengthening: Chest Pull - Resisted   Hold Theraband in front of body with hands about shoulder width a part. Pull band a part and back together slowly. Repeat _12___ times. Complete _1___ set(s) per session.. Repeat ____ session(s) per day.  http://orth.exer.us/926   Copyright  VHI. All rights reserved.   PNF Strengthening: Resisted   Standing with resistive band around each hand, bring right arm up and away, thumb back. Repeat __12__ times per set. Do __1__ sets per session. Do ____ sessions per day.    Resisted External Rotation: in Neutral - Bilateral   Sit or stand, tubing in both hands, elbows at sides, bent to 90, forearms forward. Pinch shoulder blades together and rotate forearms out. Keep elbows at sides. Repeat __12__ times per set. Do __1__ sets per session. Do ____ sessions per day.  http://orth.exer.us/966   Copyright  VHI. All rights reserved.   PNF Strengthening: Resisted   Standing, hold resistive band above head. Bring right arm down and out from side. Repeat _12___ times per set. Do _1___ sets per session. Do ____ sessions per day.  http://orth.exer.us/922   Copyright  VHI. All rights reserved.

## 2014-12-31 ENCOUNTER — Encounter: Payer: Self-pay | Admitting: Vascular Surgery

## 2015-01-05 ENCOUNTER — Encounter: Payer: Self-pay | Admitting: Vascular Surgery

## 2015-01-05 ENCOUNTER — Ambulatory Visit (INDEPENDENT_AMBULATORY_CARE_PROVIDER_SITE_OTHER): Payer: Medicare Other | Admitting: Vascular Surgery

## 2015-01-05 VITALS — BP 134/76 | HR 58 | Temp 97.8°F | Resp 16 | Ht 64.0 in | Wt 233.0 lb

## 2015-01-05 DIAGNOSIS — I83891 Varicose veins of right lower extremities with other complications: Secondary | ICD-10-CM

## 2015-01-05 NOTE — Progress Notes (Signed)
Subjective:     Patient ID: Kim Dixon, female   DOB: Aug 04, 1940, 74 y.o.   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 She had previously undergone laser ablation of the right leg. She states that the edema in the right leg has significantly improved and that the pain in the thigh and calf area has resolved. She is quite pleased with her result. She denies any chest pain dyspnea on exertion or hemoptysis. She is not wearing elastic compression stocking at this point.  Review of Systems     Objective:   Physical Exam BP 134/76 mmHg  Pulse 58  Temp(Src) 97.8 F (36.6 C)  Resp 16  Ht 5\' 4"  (1.626 m)  Wt 233 lb (105.688 kg)  BMI 39.97 kg/m2  SpO2 100%   Gen. Well-developed well-nourished female no apparent distress alert and oriented 3 Right leg is free of any large bulging varicosities. Minimal distal edema noted. 3+ dorsalis pedis pulse palpable.     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

## 2015-05-24 ENCOUNTER — Encounter (HOSPITAL_COMMUNITY): Payer: Self-pay

## 2015-12-16 IMAGING — CR DG ABDOMEN 1V
1 series · 1 of 1 positions shown · non-contrast
Comparison: March 24, 2014 abdominal radiograph and CT abdomen and
pelvis March 18, 2014

CLINICAL DATA: Left ureteral calculus

EXAM:
ABDOMEN - 1 VIEW

[t abdomen supine]
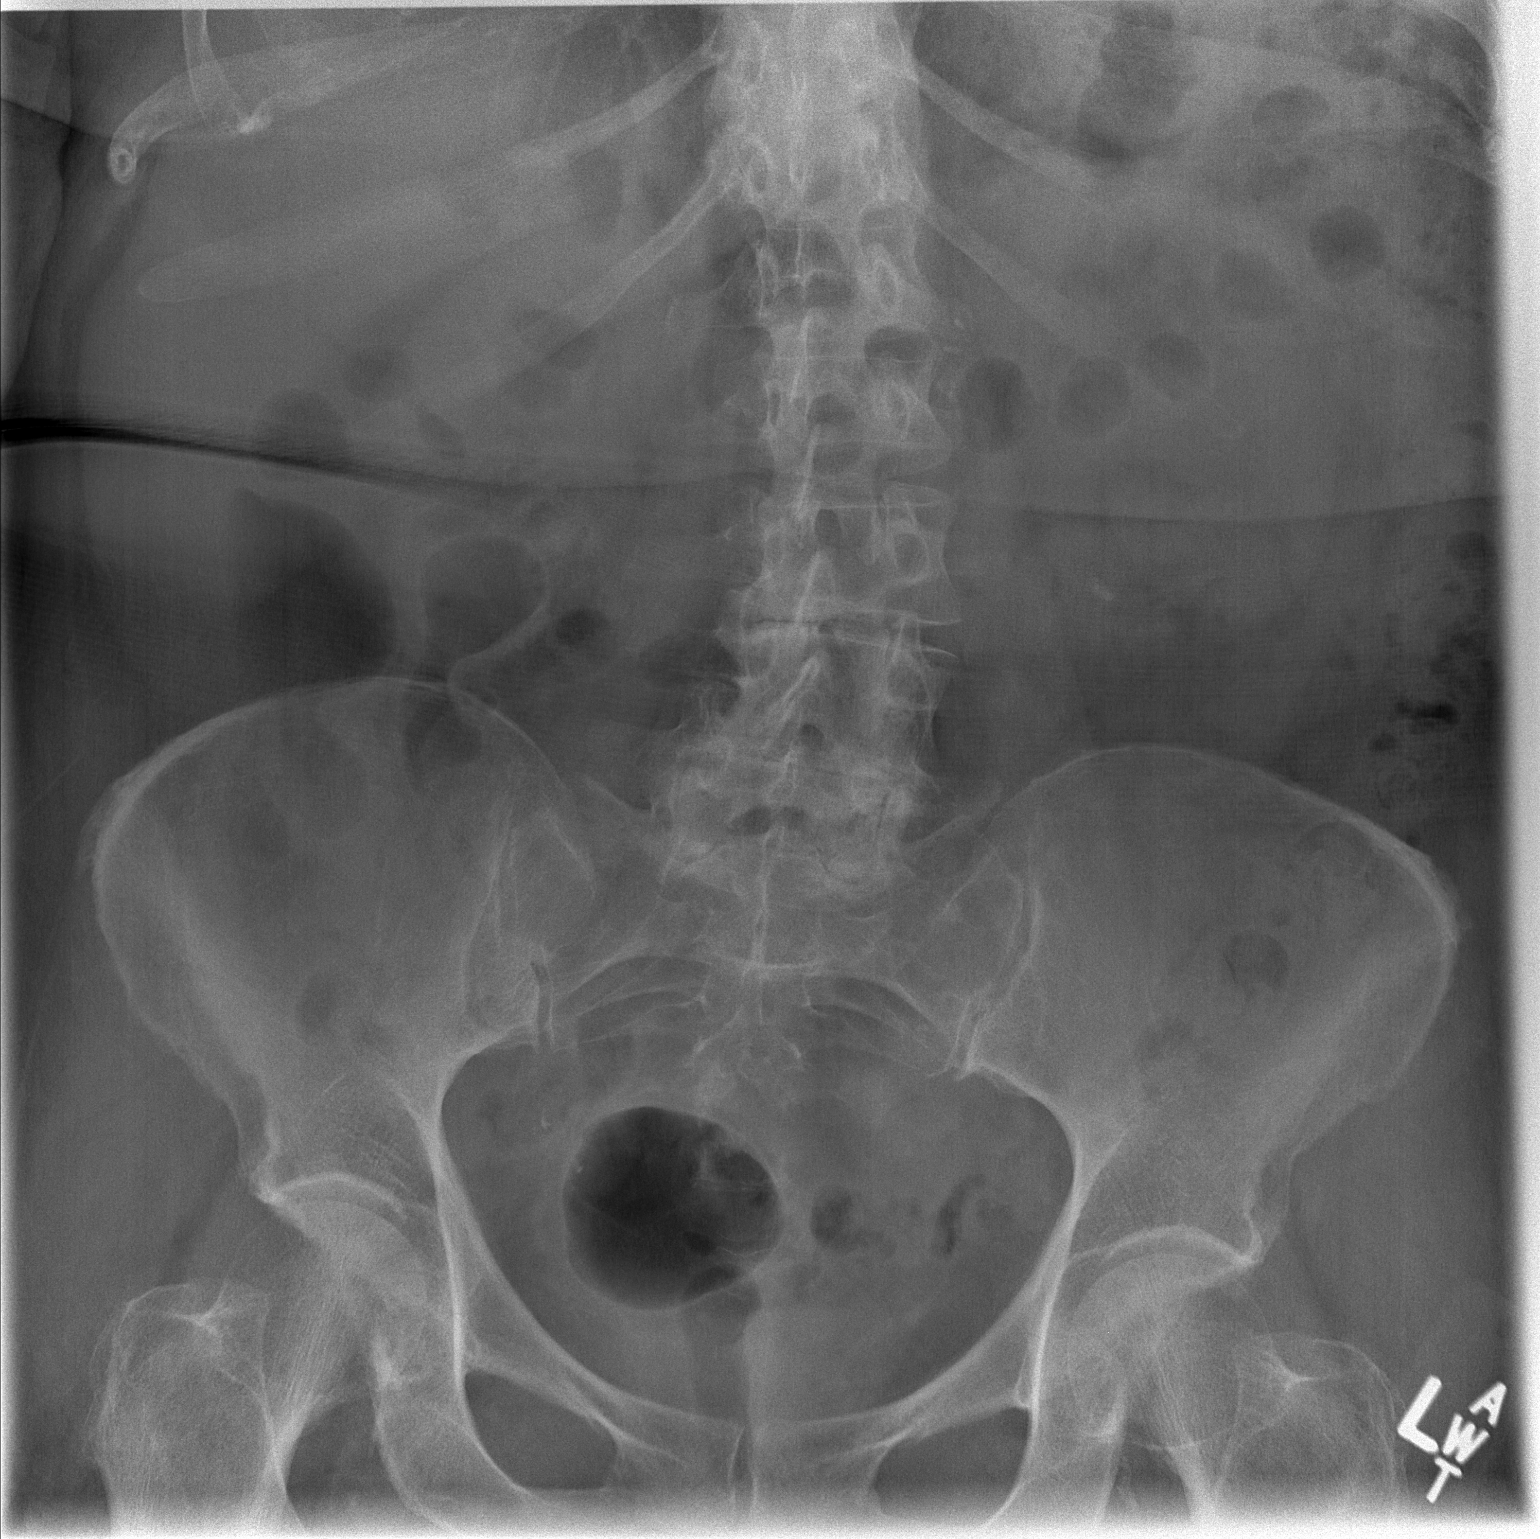

[1 of 1 positions shown; findings below may reference images not displayed]

FINDINGS: There is a persistent calcification to the left of L3 measuring 7 x
3 mm in size. There is no new calcification. There are vascular
calcifications in the pelvis. Bowel gas pattern is unremarkable.
There is moderate stool in the colon. There is lumbar levoscoliosis.
IMPRESSION: Stable presumed proximal left ureteral calculus to the left of L3.
No new calcifications. Bowel gas pattern unremarkable.

## 2016-01-06 ENCOUNTER — Other Ambulatory Visit (HOSPITAL_COMMUNITY): Payer: Self-pay | Admitting: Internal Medicine

## 2016-01-06 DIAGNOSIS — Z1231 Encounter for screening mammogram for malignant neoplasm of breast: Secondary | ICD-10-CM

## 2016-01-19 ENCOUNTER — Ambulatory Visit (HOSPITAL_COMMUNITY)
Admission: RE | Admit: 2016-01-19 | Discharge: 2016-01-19 | Disposition: A | Discharge status: Home / Self Care | Payer: Medicare Other | Source: Ambulatory Visit | Attending: Internal Medicine | Admitting: Internal Medicine

## 2016-01-19 DIAGNOSIS — Z1231 Encounter for screening mammogram for malignant neoplasm of breast: Secondary | ICD-10-CM | POA: Insufficient documentation

## 2016-08-31 ENCOUNTER — Other Ambulatory Visit (HOSPITAL_COMMUNITY): Payer: Self-pay | Admitting: Internal Medicine

## 2016-08-31 ENCOUNTER — Ambulatory Visit (HOSPITAL_COMMUNITY)
Admission: RE | Admit: 2016-08-31 | Discharge: 2016-08-31 | Disposition: A | Discharge status: Home / Self Care | Payer: Medicare Other | Source: Ambulatory Visit | Attending: Internal Medicine | Admitting: Internal Medicine

## 2017-06-22 ENCOUNTER — Other Ambulatory Visit (HOSPITAL_COMMUNITY): Payer: Self-pay | Admitting: Internal Medicine

## 2017-06-22 DIAGNOSIS — Z1231 Encounter for screening mammogram for malignant neoplasm of breast: Secondary | ICD-10-CM

## 2017-06-27 ENCOUNTER — Ambulatory Visit (HOSPITAL_COMMUNITY)
Admission: RE | Admit: 2017-06-27 | Discharge: 2017-06-27 | Disposition: A | Discharge status: Home / Self Care | Payer: Medicare Other | Source: Ambulatory Visit | Attending: Internal Medicine | Admitting: Internal Medicine

## 2017-06-27 DIAGNOSIS — Z1231 Encounter for screening mammogram for malignant neoplasm of breast: Secondary | ICD-10-CM | POA: Diagnosis not present

## 2017-12-04 ENCOUNTER — Ambulatory Visit: Payer: Medicare Other | Admitting: General Surgery

## 2017-12-04 ENCOUNTER — Encounter: Payer: Self-pay | Admitting: General Surgery

## 2017-12-04 VITALS — BP 105/66 | HR 58 | Temp 98.7°F | Resp 18 | Wt 260.0 lb

## 2017-12-04 DIAGNOSIS — L723 Sebaceous cyst: Secondary | ICD-10-CM | POA: Diagnosis not present

## 2017-12-04 NOTE — H&P (Signed)
Kim Dixon; [id number]; 09/01/1940   HPI Patient is a 77 year old white female who was referred to my care by Dr. Willey Blade for evaluation treatment of a sebaceous cyst on her back.  This was excised 10 years ago.  She has had a recurrence recently.  It is tender to touch and seems to be increasing in size.  No drainage has been noted.  She currently has no pain in that area. Past Medical History:  Diagnosis Date  . Anxiety   . Arthritis   . Depression   . Glaucoma   . Heart murmur   . Hyperlipidemia   . Hypertension   . Kidney stone    03/2014   . Pneumonia    hx of   . Stress incontinence   . Urinary frequency   . Varicose veins     Past Surgical History:  Procedure Laterality Date  . ABDOMINAL HYSTERECTOMY    . ABLATION SAPHENOUS VEIN W/ RFA     right leg  . COLONOSCOPY    . COLONOSCOPY N/A 06/18/2013   Procedure: COLONOSCOPY;  Surgeon: Rogene Houston, MD;  [facility]: [facility];  Service: Endoscopy;  Laterality: N/A;  830  . EYE SURGERY     laser surgery bilat   . LITHOTRIPSY    . TONSILLECTOMY    . 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 Coronary artery disease Mother   . Lung cancer Father   . Colon cancer Neg Hx   . Stomach cancer Neg Hx     Current Outpatient Medications on File Prior to Visit  Medication Sig Dispense Refill  . Ascorbic Acid (VITAMIN C) 1000 MG tablet Take 3,000 mg by mouth daily.     Marland Kitchen atorvastatin (LIPITOR) 10 MG tablet Take 10 mg by mouth at bedtime.     . cholecalciferol (VITAMIN D) 1000 UNITS tablet Take 1,000 Units by mouth daily.     . citalopram (CELEXA) 20 MG tablet Take 20 mg by mouth every morning.     . meloxicam (MOBIC) 7.5 MG tablet Take 7.5-15 mg by mouth daily as needed for pain.     Marland Kitchen torsemide (DEMADEX) 20 MG tablet Take 10 mg by mouth  daily.      No current facility-administered medications on file prior to visit.     Allergies  Allergen Reactions  . Naproxen Sodium     Tightness in throat    Social History   Substance and Sexual Activity  Alcohol Use Yes  . Alcohol/week: 7.0 standard drinks  . Types: 7 Glasses of wine per week   Comment: wine nightly     Social History   Tobacco Use  Smoking Status Former Smoker  . Packs/day: 3.00  . Years: 30.00  . Pack years: 90.00  . Types: Cigarettes  . Last attempt to quit: 04/04/1993  . Years since quitting: 24.6  Smokeless Tobacco Never Used    Review of Systems  Constitutional: Positive for malaise/fatigue.  HENT: Negative.   Eyes: Negative.   Respiratory: Positive for shortness of breath.   Cardiovascular: Negative.   Gastrointestinal: Negative.   Genitourinary: Positive for frequency.  Musculoskeletal: Positive for joint pain.  Skin: Negative.   Neurological: Negative.   Endo/Heme/Allergies: Negative.   Psychiatric/Behavioral: Negative.     Objective   Vitals:  12/04/17 1033  BP: 105/66  Pulse: (!) 58  Resp: 18  Temp: 98.7 F (37.1 C)    Physical Exam  Constitutional: She is oriented to person, place, and time. She appears well-developed and well-nourished. No distress.  HENT:  Head: Normocephalic and atraumatic.  Cardiovascular: Normal rate, regular rhythm and normal heart sounds. Exam reveals no gallop and no friction rub.  No murmur heard. Pulmonary/Chest: Effort normal and breath sounds normal. No stridor. No respiratory distress. She has no wheezes. She has no rales.  Neurological: She is alert and oriented to person, place, and time.  Skin: Skin is warm and dry.  3 cm subcutaneous oval mass with punctum present.  Surgical scar overlying this region.  Is located centrally in the upper back region.  Vitals reviewed. Dr. Ria Comment notes reviewed  Assessment  Sebaceous cyst, back Plan   Patient is scheduled for excision of the  sebaceous cyst, back on 12/07/2017.  The risks and benefits of the procedure including bleeding, infection, and recurrence of the cyst were fully explained to the patient, who gave informed consent.

## 2017-12-04 NOTE — Patient Instructions (Signed)
Epidermal Cyst An epidermal cyst is a small, painless lump under your skin. It may be called an epidermal inclusion cyst or an infundibular cyst. The cyst contains a grayish-white, bad-smelling substance (keratin). It is important not to pop epidermal cysts yourself. These cysts are usually harmless (benign), but they can get infected. Symptoms of infection may include:  Redness.  Inflammation.  Tenderness.  Warmth.  Fever.  A grayish-white, bad-smelling substance draining from the cyst.  Pus draining from the cyst.  Follow these instructions at home:  Take over-the-counter and prescription medicines only as told by your doctor.  If you were prescribed an antibiotic, use it as told by your doctor. Do not stop using the antibiotic even if you start to feel better.  Keep the area around your cyst clean and dry.  Wear loose, dry clothing.  Do not try to pop your cyst.  Avoid touching your cyst.  Check your cyst every day for signs of infection.  Keep all follow-up visits as told by your doctor. This is important. How is this prevented?  Wear clean, dry, clothing.  Avoid wearing tight clothing.  Keep your skin clean and dry. Shower or take baths every day.  Wash your body with a benzoyl peroxide wash when you shower or bathe. Contact a health care provider if:  Your cyst has symptoms of infection.  Your condition is not improving or is getting worse.  You have a cyst that looks different from other cysts you have had.  You have a fever. Get help right away if:  Redness spreads from the cyst into the surrounding area. This information is not intended to replace advice given to you by your health care provider. Make sure you discuss any questions you have with your health care provider. Document Released: 04/13/2004 Document Revised: 11/03/2015 Document Reviewed: 01/06/2015 Elsevier Interactive Patient Education  2018 [employer]  

## 2017-12-04 NOTE — Patient Instructions (Signed)
Kim Dixon  12/04/2017     @PREFPERIOPPHARMACY @   Your procedure is scheduled on  12/07/2017   Report to [facility] at  1010  A.M.  Call this number if you have problems the morning of surgery:  [phone]   Remember:  Do not eat or drink after midnight.  You may drink clear liquids until  12 midnight 12/06/2017 .  Clear liquids allowed are:                    Water, Juice (non-citric and without pulp), Carbonated beverages, Clear Tea, Black Coffee only, Plain Jell-O only, Gatorade and Plain Popsicles only    Take these medicines the morning of surgery with A SIP OF WATER  Celexa, losartan, mobic.    Do not wear jewelry, make-up or nail polish.  Do not wear lotions, powders, or perfumes, or deodorant.  Do not shave 48 hours prior to surgery.  Men may shave face and neck.  Do not bring valuables to the hospital.  [facility] is not responsible for any belongings or valuables.  Contacts, dentures or bridgework may not be worn into surgery.  Leave your suitcase in the car.  After surgery it may be brought to your room.  For patients admitted to the hospital, discharge time will be determined by your treatment team.  Patients discharged the day of surgery will not be allowed to drive home.   Name and phone number of your driver:   family Special instructions:  None  Please read over the following fact sheets that you were given. Anesthesia Post-op Instructions and Care and Recovery After Surgery      Epidermal Cyst Removal Epidermal cyst removal is a procedure to remove a sac of oily material that forms under your skin (epidermal cyst). Epidermal cysts may also be called epidermoid cysts or keratin cysts. Normally, the skin secretes this oily material through a gland or a hair follicle. This type of cyst usually results when a skin gland or hair follicle becomes blocked. You may need this procedure if you have an epidermal cyst that becomes large,  uncomfortable, or infected. Tell a health care provider about:  Any allergies you have.  All medicines you are taking, including vitamins, herbs, eye drops, creams, and over-the-counter medicines.  Any problems you or family members have had with anesthetic medicines.  Any blood disorders you have.  Any surgeries you have had.  Any medical conditions you have. What are the risks? Generally, this is a safe procedure. However, problems may occur, including:  Developing another cyst.  Bleeding.  Infection.  Scarring.  What happens before the procedure?  Ask your health care provider about: ? Changing or stopping your regular medicines. This is especially important if you are taking diabetes medicines or blood thinners. ? Taking medicines such as aspirin and ibuprofen. These medicines can thin your blood. Do not take these medicines before your procedure if your health care provider instructs you not to.  If you have an infected cyst, you may have to take antibiotic medicines before or after the cyst removal. Take your antibiotics as directed by your health care provider. Finish all of the medicine even if you start to feel better.  Take a shower on the morning of your procedure. Your health care provider may ask you to use a germ-killing (antiseptic) soap. What happens during the procedure?  You will be given a medicine  that numbs the area (local anesthetic).  The skin around the cyst will be cleaned with a germ-killing solution (antiseptic).  Your health care provider will make a small surgical incision over the cyst.  The cyst will be separated from the surrounding tissues that are under your skin.  If possible, the cyst will be removed undamaged (intact).  If the cyst bursts (ruptures), it will need to be removed in pieces.  After the cyst is removed, your health care provider will control any bleeding and close the incision with small stitches (sutures). Small  incisions may not need sutures, and the bleeding will be controlled by applying direct pressure with gauze.  Your health care provider may apply antibiotic ointment and a light bandage (dressing) over the incision. This procedure may vary among health care providers and hospitals. What happens after the procedure?  If your cyst ruptured during surgery, you may need to take antibiotic medicine. If you were prescribed an antibiotic medicine, finish all of it even if you start to feel better. This information is not intended to replace advice given to you by your health care provider. Make sure you discuss any questions you have with your health care provider. Document Released: 03/03/2000 Document Revised: 08/12/2015 Document Reviewed: 11/19/2013 Elsevier Interactive Patient Education  2018 [place]. Epidermal Cyst Removal, Care After Refer to this sheet in the next few weeks. These instructions provide you with information about caring for yourself after your procedure. Your health care provider may also give you more specific instructions. Your treatment has been planned according to current medical practices, but problems sometimes occur. Call your health care provider if you have any problems or questions after your procedure. What can I expect after the procedure? After the procedure, it is common to have:  Soreness in the area where your cyst was removed.  Tightness or itching from your skin sutures.  Follow these instructions at home:  Take medicines only as directed by your health care provider.  If you were prescribed an antibiotic medicine, finish all of it even if you start to feel better.  Use antibiotic ointment as directed by your health care provider. Follow the instructions carefully.  There are many different ways to close and cover an incision, including stitches (sutures), skin glue, and adhesive strips. Follow your health care provider's instructions  about: ? Incision care. ? Bandage (dressing) changes and removal. ? Incision closure removal.  Keep the bandage (dressing) dry until your health care provider says that it can be removed. Take sponge baths only. Ask your health care provider when you can start showering or taking a bath.  After your dressing is off, check your incision every day for signs of infection. Watch for: ? Redness, swelling, or pain. ? Fluid, blood, or pus.  You can return to your normal activities. Do not do anything that stretches or puts pressure on your incision.  You can return to your normal diet.  Keep all follow-up visits as directed by your health care provider. This is important. Contact a health care provider if:  You have a fever.  Your incision bleeds.  You have redness, swelling, or pain in the incision area.  You have fluid, blood, or pus coming from your incision.  Your cyst comes back after surgery. This information is not intended to replace advice given to you by your health care provider. Make sure you discuss any questions you have with your health care provider. Document Released: 03/27/2014 Document Revised: 08/12/2015  Document Reviewed: 11/19/2013 Elsevier Interactive Patient Education  2018 [place] Anesthesia is a term that refers to techniques, procedures, and medicines that help a person stay safe and comfortable during a medical procedure. Monitored anesthesia care, or sedation, is one type of anesthesia. Your anesthesia specialist may recommend sedation if you will be having a procedure that does not require you to be unconscious, such as:  Cataract surgery.  A dental procedure.  A biopsy.  A colonoscopy.  During the procedure, you may receive a medicine to help you relax (sedative). There are three levels of sedation:  Mild sedation. At this level, you may feel awake and relaxed. You will be able to follow directions.  Moderate  sedation. At this level, you will be sleepy. You may not remember the procedure.  Deep sedation. At this level, you will be asleep. You will not remember the procedure.  The more medicine you are given, the deeper your level of sedation will be. Depending on how you respond to the procedure, the anesthesia specialist may change your level of sedation or the type of anesthesia to fit your needs. An anesthesia specialist will monitor you closely during the procedure. Let your health care provider know about:  Any allergies you have.  All medicines you are taking, including vitamins, herbs, eye drops, creams, and over-the-counter medicines.  Any use of steroids (by mouth or as a cream).  Any problems you or family members have had with sedatives and anesthetic medicines.  Any blood disorders you have.  Any surgeries you have had.  Any medical conditions you have, such as sleep apnea.  Whether you are pregnant or may be pregnant.  Any use of cigarettes, alcohol, or street drugs. What are the risks? Generally, this is a safe procedure. However, problems may occur, including:  Getting too much medicine (oversedation).  Nausea.  Allergic reaction to medicines.  Trouble breathing. If this happens, a breathing tube may be used to help with breathing. It will be removed when you are awake and breathing on your own.  Heart trouble.  Lung trouble.  Before the procedure Staying hydrated Follow instructions from your health care provider about hydration, which may include:  Up to 2 hours before the procedure - you may continue to drink clear liquids, such as water, clear fruit juice, black coffee, and plain tea.  Eating and drinking restrictions Follow instructions from your health care provider about eating and drinking, which may include:  8 hours before the procedure - stop eating heavy meals or foods such as meat, fried foods, or fatty foods.  6 hours before the procedure -  stop eating light meals or foods, such as toast or cereal.  6 hours before the procedure - stop drinking milk or drinks that contain milk.  2 hours before the procedure - stop drinking clear liquids.  Medicines Ask your health care provider about:  Changing or stopping your regular medicines. This is especially important if you are taking diabetes medicines or blood thinners.  Taking medicines such as aspirin and ibuprofen. These medicines can thin your blood. Do not take these medicines before your procedure if your health care provider instructs you not to.  Tests and exams  You will have a physical exam.  You may have blood tests done to show: ? How well your kidneys and liver are working. ? How well your blood can clot.  General instructions  Plan to have someone take you home from the  hospital or clinic.  If you will be going home right after the procedure, plan to have someone with you for 24 hours.  What happens during the procedure?  Your blood pressure, heart rate, breathing, level of pain and overall condition will be monitored.  An IV tube will be inserted into one of your veins.  Your anesthesia specialist will give you medicines as needed to keep you comfortable during the procedure. This may mean changing the level of sedation.  The procedure will be performed. After the procedure  Your blood pressure, heart rate, breathing rate, and blood oxygen level will be monitored until the medicines you were given have worn off.  Do not drive for 24 hours if you received a sedative.  You may: ? Feel sleepy, clumsy, or nauseous. ? Feel forgetful about what happened after the procedure. ? Have a sore throat if you had a breathing tube during the procedure. ? Vomit. This information is not intended to replace advice given to you by your health care provider. Make sure you discuss any questions you have with your health care provider. Document Released: 11/30/2004  Document Revised: 08/13/2015 Document Reviewed: 06/27/2015 Elsevier Interactive Patient Education  2018 [place], Care After These instructions provide you with information about caring for yourself after your procedure. Your health care provider may also give you more specific instructions. Your treatment has been planned according to current medical practices, but problems sometimes occur. Call your health care provider if you have any problems or questions after your procedure. What can I expect after the procedure? After your procedure, it is common to:  Feel sleepy for several hours.  Feel clumsy and have poor balance for several hours.  Feel forgetful about what happened after the procedure.  Have poor judgment for several hours.  Feel nauseous or vomit.  Have a sore throat if you had a breathing tube during the procedure.  Follow these instructions at home: For at least 24 hours after the procedure:   Do not: ? Participate in activities in which you could fall or become injured. ? Drive. ? Use heavy machinery. ? Drink alcohol. ? Take sleeping pills or medicines that cause drowsiness. ? Make important decisions or sign legal documents. ? Take care of children on your own.  Rest. Eating and drinking  Follow the diet that is recommended by your health care provider.  If you vomit, drink water, juice, or soup when you can drink without vomiting.  Make sure you have little or no nausea before eating solid foods. General instructions  Have a responsible adult stay with you until you are awake and alert.  Take over-the-counter and prescription medicines only as told by your health care provider.  If you smoke, do not smoke without supervision.  Keep all follow-up visits as told by your health care provider. This is important. Contact a health care provider if:  You keep feeling nauseous or you keep vomiting.  You feel  light-headed.  You develop a rash.  You have a fever. Get help right away if:  You have trouble breathing. This information is not intended to replace advice given to you by your health care provider. Make sure you discuss any questions you have with your health care provider. Document Released: 06/27/2015 Document Revised: 10/27/2015 Document Reviewed: 06/27/2015 Elsevier Interactive Patient Education  [employer].

## 2017-12-04 NOTE — Progress Notes (Signed)
Kim Dixon; [id number]; 1940/07/12   HPI Patient is a 77 year old white female who was referred to my care by Dr. Willey Blade for evaluation treatment of a sebaceous cyst on her back.  This was excised 10 years ago.  She has had a recurrence recently.  It is tender to touch and seems to be increasing in size.  No drainage has been noted.  She currently has no pain in that area. Past Medical History:  Diagnosis Date  . Anxiety   . Arthritis   . Depression   . Glaucoma   . Heart murmur   . Hyperlipidemia   . Hypertension   . Kidney stone    03/2014   . Pneumonia    hx of   . Stress incontinence   . Urinary frequency   . Varicose veins     Past Surgical History:  Procedure Laterality Date  . ABDOMINAL HYSTERECTOMY    . ABLATION SAPHENOUS VEIN W/ RFA     right leg  . COLONOSCOPY    . COLONOSCOPY N/A 06/18/2013   Procedure: COLONOSCOPY;  Surgeon: Rogene Houston, MD;  Location: [facility];  Service: Endoscopy;  Laterality: N/A;  830  . EYE SURGERY     laser surgery bilat   . LITHOTRIPSY    . TONSILLECTOMY    . 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 Coronary artery disease Mother   . Lung cancer Father   . Colon cancer Neg Hx   . Stomach cancer Neg Hx     Current Outpatient Medications on File Prior to Visit  Medication Sig Dispense Refill  . Ascorbic Acid (VITAMIN C) 1000 MG tablet Take 3,000 mg by mouth daily.     Marland Kitchen atorvastatin (LIPITOR) 10 MG tablet Take 10 mg by mouth at bedtime.     . cholecalciferol (VITAMIN D) 1000 UNITS tablet Take 1,000 Units by mouth daily.     . citalopram (CELEXA) 20 MG tablet Take 20 mg by mouth every morning.     . meloxicam (MOBIC) 7.5 MG tablet Take 7.5-15 mg by mouth daily as needed for pain.     Marland Kitchen torsemide (DEMADEX) 20 MG tablet Take 10 mg by mouth  daily.      No current facility-administered medications on file prior to visit.     Allergies  Allergen Reactions  . Naproxen Sodium     Tightness in throat    Social History   Substance and Sexual Activity  Alcohol Use Yes  . Alcohol/week: 7.0 standard drinks  . Types: 7 Glasses of wine per week   Comment: wine nightly     Social History   Tobacco Use  Smoking Status Former Smoker  . Packs/day: 3.00  . Years: 30.00  . Pack years: 90.00  . Types: Cigarettes  . Last attempt to quit: 04/04/1993  . Years since quitting: 24.6  Smokeless Tobacco Never Used    Review of Systems  Constitutional: Positive for malaise/fatigue.  HENT: Negative.   Eyes: Negative.   Respiratory: Positive for shortness of breath.   Cardiovascular: Negative.   Gastrointestinal: Negative.   Genitourinary: Positive for frequency.  Musculoskeletal: Positive for joint pain.  Skin: Negative.   Neurological: Negative.   Endo/Heme/Allergies: Negative.   Psychiatric/Behavioral: Negative.     Objective   Vitals:  12/04/17 1033  BP: 105/66  Pulse: (!) 58  Resp: 18  Temp: 98.7 F (37.1 C)    Physical Exam  Constitutional: She is oriented to person, place, and time. She appears well-developed and well-nourished. No distress.  HENT:  Head: Normocephalic and atraumatic.  Cardiovascular: Normal rate, regular rhythm and normal heart sounds. Exam reveals no gallop and no friction rub.  No murmur heard. Pulmonary/Chest: Effort normal and breath sounds normal. No stridor. No respiratory distress. She has no wheezes. She has no rales.  Neurological: She is alert and oriented to person, place, and time.  Skin: Skin is warm and dry.  3 cm subcutaneous oval mass with punctum present.  Surgical scar overlying this region.  Is located centrally in the upper back region.  Vitals reviewed. Dr. Ria Comment notes reviewed  Assessment  Sebaceous cyst, back Plan   Patient is scheduled for excision of the  sebaceous cyst, back on 12/07/2017.  The risks and benefits of the procedure including bleeding, infection, and recurrence of the cyst were fully explained to the patient, who gave informed consent.

## 2017-12-05 ENCOUNTER — Encounter (HOSPITAL_COMMUNITY): Payer: Self-pay

## 2017-12-05 ENCOUNTER — Other Ambulatory Visit: Payer: Self-pay

## 2017-12-05 ENCOUNTER — Encounter (HOSPITAL_COMMUNITY)
Admission: RE | Admit: 2017-12-05 | Discharge: 2017-12-05 | Disposition: A | Discharge status: Home / Self Care | Payer: Medicare Other | Source: Ambulatory Visit | Attending: General Surgery | Admitting: General Surgery

## 2017-12-05 DIAGNOSIS — H409 Unspecified glaucoma: Secondary | ICD-10-CM | POA: Diagnosis not present

## 2017-12-05 DIAGNOSIS — Z8249 Family history of ischemic heart disease and other diseases of the circulatory system: Secondary | ICD-10-CM | POA: Diagnosis not present

## 2017-12-05 DIAGNOSIS — F419 Anxiety disorder, unspecified: Secondary | ICD-10-CM | POA: Diagnosis not present

## 2017-12-05 DIAGNOSIS — Z87442 Personal history of urinary calculi: Secondary | ICD-10-CM | POA: Diagnosis not present

## 2017-12-05 DIAGNOSIS — R0602 Shortness of breath: Secondary | ICD-10-CM | POA: Diagnosis not present

## 2017-12-05 DIAGNOSIS — M1990 Unspecified osteoarthritis, unspecified site: Secondary | ICD-10-CM | POA: Diagnosis not present

## 2017-12-05 DIAGNOSIS — E785 Hyperlipidemia, unspecified: Secondary | ICD-10-CM | POA: Diagnosis not present

## 2017-12-05 DIAGNOSIS — I10 Essential (primary) hypertension: Secondary | ICD-10-CM | POA: Diagnosis not present

## 2017-12-05 DIAGNOSIS — Z90710 Acquired absence of both cervix and uterus: Secondary | ICD-10-CM | POA: Diagnosis not present

## 2017-12-05 DIAGNOSIS — L723 Sebaceous cyst: Secondary | ICD-10-CM | POA: Diagnosis present

## 2017-12-05 DIAGNOSIS — Z87891 Personal history of nicotine dependence: Secondary | ICD-10-CM | POA: Diagnosis not present

## 2017-12-05 DIAGNOSIS — Z96652 Presence of left artificial knee joint: Secondary | ICD-10-CM | POA: Diagnosis not present

## 2017-12-05 DIAGNOSIS — R350 Frequency of micturition: Secondary | ICD-10-CM | POA: Diagnosis not present

## 2017-12-05 DIAGNOSIS — I8390 Asymptomatic varicose veins of unspecified lower extremity: Secondary | ICD-10-CM | POA: Diagnosis not present

## 2017-12-05 DIAGNOSIS — R011 Cardiac murmur, unspecified: Secondary | ICD-10-CM | POA: Diagnosis not present

## 2017-12-05 DIAGNOSIS — F329 Major depressive disorder, single episode, unspecified: Secondary | ICD-10-CM | POA: Diagnosis not present

## 2017-12-05 DIAGNOSIS — I739 Peripheral vascular disease, unspecified: Secondary | ICD-10-CM | POA: Diagnosis not present

## 2017-12-05 DIAGNOSIS — N393 Stress incontinence (female) (male): Secondary | ICD-10-CM | POA: Diagnosis not present

## 2017-12-05 DIAGNOSIS — Z801 Family history of malignant neoplasm of trachea, bronchus and lung: Secondary | ICD-10-CM | POA: Diagnosis not present

## 2017-12-05 DIAGNOSIS — Z886 Allergy status to analgesic agent status: Secondary | ICD-10-CM | POA: Diagnosis not present

## 2017-12-05 DIAGNOSIS — Z79899 Other long term (current) drug therapy: Secondary | ICD-10-CM | POA: Diagnosis not present

## 2017-12-05 HISTORY — DX: Personal history of urinary calculi: Z87.442

## 2017-12-05 LAB — BASIC METABOLIC PANEL
Anion gap: 4 — ABNORMAL LOW (ref 5–15)
BUN: 20 mg/dL (ref 8–23)
CHLORIDE: 108 mmol/L (ref 98–111)
CO2: 31 mmol/L (ref 22–32)
CREATININE: 0.84 mg/dL (ref 0.44–1.00)
Calcium: 10.2 mg/dL (ref 8.9–10.3)
GFR calc non Af Amer: >60 mL/min (ref >60)
Glucose, Bld: 138 mg/dL — ABNORMAL HIGH (ref 70–99)
Potassium: 3.9 mmol/L (ref 3.5–5.1)
SODIUM: 143 mmol/L (ref 135–145)

## 2017-12-05 LAB — CBC WITH DIFFERENTIAL/PLATELET
BASOS ABS: 0 10*3/uL (ref 0.0–0.1)
BASOS PCT: 1 %
EOS ABS: 0.2 10*3/uL (ref 0.0–0.7)
Eosinophils Relative: 3 %
HCT: 40.8 % (ref 36.0–46.0)
HEMOGLOBIN: 12.8 g/dL (ref 12.0–15.0)
Lymphocytes Relative: 24 %
Lymphs Abs: 1.2 10*3/uL (ref 0.7–4.0)
MCH: 31.7 pg (ref 26.0–34.0)
MCHC: 31.4 g/dL (ref 30.0–36.0)
MCV: 101 fL — ABNORMAL HIGH (ref 78.0–100.0)
MONOS PCT: 10 %
Monocytes Absolute: 0.5 10*3/uL (ref 0.1–1.0)
NEUTROS PCT: 62 %
Neutro Abs: 3.1 10*3/uL (ref 1.7–7.7)
Platelets: 151 10*3/uL (ref 150–400)
RBC: 4.04 MIL/uL (ref 3.87–5.11)
RDW: 14.6 % (ref 11.5–15.5)
WBC: 5 10*3/uL (ref 4.0–10.5)

## 2017-12-07 ENCOUNTER — Encounter (HOSPITAL_COMMUNITY)
Admission: RE | Disposition: A | Discharge status: Home / Self Care | Payer: Self-pay | Source: Ambulatory Visit | Attending: General Surgery

## 2017-12-07 ENCOUNTER — Ambulatory Visit (HOSPITAL_COMMUNITY)
Admission: RE | Admit: 2017-12-07 | Discharge: 2017-12-07 | Disposition: A | Discharge status: Home / Self Care | Payer: Medicare Other | Source: Ambulatory Visit | Attending: General Surgery | Admitting: General Surgery

## 2017-12-07 ENCOUNTER — Encounter (HOSPITAL_COMMUNITY): Payer: Self-pay | Admitting: Anesthesiology

## 2017-12-07 ENCOUNTER — Ambulatory Visit (HOSPITAL_COMMUNITY): Payer: Medicare Other | Admitting: Anesthesiology

## 2017-12-07 DIAGNOSIS — I739 Peripheral vascular disease, unspecified: Secondary | ICD-10-CM | POA: Insufficient documentation

## 2017-12-07 DIAGNOSIS — Z87442 Personal history of urinary calculi: Secondary | ICD-10-CM | POA: Insufficient documentation

## 2017-12-07 DIAGNOSIS — I10 Essential (primary) hypertension: Secondary | ICD-10-CM | POA: Insufficient documentation

## 2017-12-07 DIAGNOSIS — Z87891 Personal history of nicotine dependence: Secondary | ICD-10-CM | POA: Insufficient documentation

## 2017-12-07 DIAGNOSIS — R0602 Shortness of breath: Secondary | ICD-10-CM | POA: Insufficient documentation

## 2017-12-07 DIAGNOSIS — N393 Stress incontinence (female) (male): Secondary | ICD-10-CM | POA: Insufficient documentation

## 2017-12-07 DIAGNOSIS — Z90710 Acquired absence of both cervix and uterus: Secondary | ICD-10-CM | POA: Insufficient documentation

## 2017-12-07 DIAGNOSIS — F419 Anxiety disorder, unspecified: Secondary | ICD-10-CM | POA: Insufficient documentation

## 2017-12-07 DIAGNOSIS — F329 Major depressive disorder, single episode, unspecified: Secondary | ICD-10-CM | POA: Diagnosis not present

## 2017-12-07 DIAGNOSIS — L723 Sebaceous cyst: Secondary | ICD-10-CM

## 2017-12-07 DIAGNOSIS — Z96652 Presence of left artificial knee joint: Secondary | ICD-10-CM | POA: Insufficient documentation

## 2017-12-07 DIAGNOSIS — Z8249 Family history of ischemic heart disease and other diseases of the circulatory system: Secondary | ICD-10-CM | POA: Insufficient documentation

## 2017-12-07 DIAGNOSIS — I8390 Asymptomatic varicose veins of unspecified lower extremity: Secondary | ICD-10-CM | POA: Insufficient documentation

## 2017-12-07 DIAGNOSIS — Z801 Family history of malignant neoplasm of trachea, bronchus and lung: Secondary | ICD-10-CM | POA: Insufficient documentation

## 2017-12-07 DIAGNOSIS — E785 Hyperlipidemia, unspecified: Secondary | ICD-10-CM | POA: Insufficient documentation

## 2017-12-07 DIAGNOSIS — M1990 Unspecified osteoarthritis, unspecified site: Secondary | ICD-10-CM | POA: Insufficient documentation

## 2017-12-07 DIAGNOSIS — H409 Unspecified glaucoma: Secondary | ICD-10-CM | POA: Insufficient documentation

## 2017-12-07 DIAGNOSIS — Z79899 Other long term (current) drug therapy: Secondary | ICD-10-CM | POA: Insufficient documentation

## 2017-12-07 DIAGNOSIS — R011 Cardiac murmur, unspecified: Secondary | ICD-10-CM | POA: Insufficient documentation

## 2017-12-07 DIAGNOSIS — R350 Frequency of micturition: Secondary | ICD-10-CM | POA: Insufficient documentation

## 2017-12-07 DIAGNOSIS — Z886 Allergy status to analgesic agent status: Secondary | ICD-10-CM | POA: Insufficient documentation

## 2017-12-07 HISTORY — PX: MASS EXCISION: SHX2000

## 2017-12-07 SURGERY — EXCISION MASS
Anesthesia: Monitor Anesthesia Care | Site: Back

## 2017-12-07 MED ORDER — POVIDONE-IODINE 10 % OINT PACKET
TOPICAL_OINTMENT | CUTANEOUS | Status: DC | PRN
  Administered 2017-12-07: 1 via TOPICAL

## 2017-12-07 MED ORDER — PROPOFOL 500 MG/50ML IV EMUL
INTRAVENOUS | Status: DC | PRN
  Administered 2017-12-07: 100 ug/kg/min via INTRAVENOUS

## 2017-12-07 MED ORDER — LACTATED RINGERS IV SOLN
INTRAVENOUS | Status: DC
  Administered 2017-12-07: 1000 mL via INTRAVENOUS

## 2017-12-07 MED ORDER — MEPERIDINE HCL 50 MG/ML IJ SOLN: 6.2500 mg | INTRAMUSCULAR | Status: DC | PRN

## 2017-12-07 MED ORDER — ONDANSETRON HCL 4 MG/2ML IJ SOLN: 4.0000 mg | Freq: Once | INTRAMUSCULAR | Status: DC | PRN

## 2017-12-07 MED ORDER — PROPOFOL 10 MG/ML IV BOLUS
INTRAVENOUS | Status: AC
  Filled 2017-12-07: qty 20

## 2017-12-07 MED ORDER — LIDOCAINE HCL 1 % IJ SOLN
INTRAMUSCULAR | Status: DC | PRN
  Administered 2017-12-07: 9 mL

## 2017-12-07 MED ORDER — LIDOCAINE HCL (PF) 1 % IJ SOLN
INTRAMUSCULAR | Status: AC
  Filled 2017-12-07: qty 30

## 2017-12-07 MED ORDER — CEFAZOLIN SODIUM-DEXTROSE 2-4 GM/100ML-% IV SOLN: 2.0000 g | INTRAVENOUS | Status: DC

## 2017-12-07 MED ORDER — KETOROLAC TROMETHAMINE 30 MG/ML IJ SOLN: 30.0000 mg | Freq: Once | INTRAMUSCULAR | Status: DC | PRN

## 2017-12-07 MED ORDER — FENTANYL CITRATE (PF) 250 MCG/5ML IJ SOLN
INTRAMUSCULAR | Status: AC
  Filled 2017-12-07: qty 5

## 2017-12-07 MED ORDER — MIDAZOLAM HCL 2 MG/2ML IJ SOLN
INTRAMUSCULAR | Status: AC
  Filled 2017-12-07: qty 2

## 2017-12-07 MED ORDER — 0.9 % SODIUM CHLORIDE (POUR BTL) OPTIME
TOPICAL | Status: DC | PRN
  Administered 2017-12-07: 1000 mL

## 2017-12-07 MED ORDER — FENTANYL CITRATE (PF) 100 MCG/2ML IJ SOLN
INTRAMUSCULAR | Status: DC | PRN
  Administered 2017-12-07 (×2): 25 ug via INTRAVENOUS

## 2017-12-07 MED ORDER — HYDROCODONE-ACETAMINOPHEN 7.5-325 MG PO TABS: 1.0000 | ORAL_TABLET | Freq: Once | ORAL | Status: DC | PRN

## 2017-12-07 MED ORDER — CHLORHEXIDINE GLUCONATE CLOTH 2 % EX PADS: 6.0000 | MEDICATED_PAD | Freq: Once | CUTANEOUS | Status: DC

## 2017-12-07 MED ORDER — POVIDONE-IODINE 10 % EX OINT
TOPICAL_OINTMENT | CUTANEOUS | Status: AC
  Filled 2017-12-07: qty 1

## 2017-12-07 MED ORDER — CEFAZOLIN SODIUM-DEXTROSE 2-4 GM/100ML-% IV SOLN
INTRAVENOUS | Status: AC
  Filled 2017-12-07: qty 100

## 2017-12-07 MED ORDER — TRAMADOL HCL 50 MG PO TABS: 50.0000 mg | ORAL_TABLET | Freq: Four times a day (QID) | ORAL | 0 refills | Status: DC | PRN

## 2017-12-07 MED ORDER — SODIUM CHLORIDE 0.9 % IJ SOLN
INTRAMUSCULAR | Status: AC
  Filled 2017-12-07: qty 10

## 2017-12-07 MED ORDER — BUPIVACAINE HCL (PF) 0.5 % IJ SOLN
INTRAMUSCULAR | Status: AC
  Filled 2017-12-07: qty 30

## 2017-12-07 MED ORDER — HYDROMORPHONE HCL 1 MG/ML IJ SOLN: 0.2500 mg | INTRAMUSCULAR | Status: DC | PRN

## 2017-12-07 SURGICAL SUPPLY — 33 items
ADH SKN CLS APL DERMABOND .7 (GAUZE/BANDAGES/DRESSINGS)
BLADE SURG SZ11 CARB STEEL (BLADE) ×3 IMPLANT
CHLORAPREP W/TINT 10.5 ML (MISCELLANEOUS) ×3 IMPLANT
CLOTH BEACON ORANGE TIMEOUT ST (SAFETY) ×3 IMPLANT
COVER LIGHT HANDLE STERIS (MISCELLANEOUS) ×6 IMPLANT
DECANTER SPIKE VIAL GLASS SM (MISCELLANEOUS) ×3 IMPLANT
DERMABOND ADVANCED (GAUZE/BANDAGES/DRESSINGS)
DERMABOND ADVANCED .7 DNX12 (GAUZE/BANDAGES/DRESSINGS) IMPLANT
DRAPE EENT ADH APERT 31X51 STR (DRAPES) IMPLANT
ELECT NEEDLE TIP 2.8 STRL (NEEDLE) IMPLANT
ELECT REM PT RETURN 9FT ADLT (ELECTROSURGICAL) ×3
ELECTRODE REM PT RTRN 9FT ADLT (ELECTROSURGICAL) ×1 IMPLANT
GAUZE SPONGE 4X4 12PLY STRL (GAUZE/BANDAGES/DRESSINGS) ×3 IMPLANT
GLOVE BIOGEL PI IND STRL 7.0 (GLOVE) ×1 IMPLANT
GLOVE BIOGEL PI INDICATOR 7.0 (GLOVE) ×2
GLOVE SURG SS PI 7.5 STRL IVOR (GLOVE) ×3 IMPLANT
GOWN STRL REUS W/ TWL XL LVL3 (GOWN DISPOSABLE) ×1 IMPLANT
GOWN STRL REUS W/TWL LRG LVL3 (GOWN DISPOSABLE) ×3 IMPLANT
GOWN STRL REUS W/TWL XL LVL3 (GOWN DISPOSABLE) ×3
KIT TURNOVER KIT A (KITS) ×3 IMPLANT
MANIFOLD NEPTUNE II (INSTRUMENTS) ×3 IMPLANT
NEEDLE HYPO 25X1 1.5 SAFETY (NEEDLE) ×3 IMPLANT
NS IRRIG 1000ML POUR BTL (IV SOLUTION) ×3 IMPLANT
PACK MINOR (CUSTOM PROCEDURE TRAY) ×3 IMPLANT
PAD ARMBOARD 7.5X6 YLW CONV (MISCELLANEOUS) ×3 IMPLANT
SET BASIN LINEN APH (SET/KITS/TRAYS/PACK) ×3 IMPLANT
SPONGE GAUZE 2X2 NONSTERILE (GAUZE/BANDAGES/DRESSINGS) ×3 IMPLANT
SUT ETHILON 3 0 FSL (SUTURE) ×3 IMPLANT
SUT MNCRL AB 4-0 PS2 18 (SUTURE) IMPLANT
SUT PROLENE 4 0 PS 2 18 (SUTURE) IMPLANT
SUT VIC AB 3-0 SH 27 (SUTURE)
SUT VIC AB 3-0 SH 27X BRD (SUTURE) IMPLANT
SYR CONTROL 10ML LL (SYRINGE) ×3 IMPLANT

## 2017-12-07 NOTE — Anesthesia Postprocedure Evaluation (Signed)
Anesthesia Post Note  Patient: Kim Dixon  Procedure(s) Performed: EXCISION 3CM CYST ON BACK (N/A Back)  Patient location during evaluation: PACU Anesthesia Type: MAC Level of consciousness: awake and alert and patient cooperative Pain management: pain level controlled Vital Signs Assessment: post-procedure vital signs reviewed and stable Respiratory status: spontaneous breathing, nonlabored ventilation and respiratory function stable Cardiovascular status: blood pressure returned to baseline Postop Assessment: no apparent nausea or vomiting Anesthetic complications: no     Last Vitals:  Vitals:   12/07/17 1100 12/07/17 1336  BP: (!) 145/53   Pulse: (!) 55   Resp: 18   Temp: 36.6 C 36.6 C  SpO2: 98%     Last Pain:  Vitals:   12/07/17 1336  PainSc: 0-No pain                 Mavis Gravelle J

## 2017-12-07 NOTE — Interval H&P Note (Signed)
History and Physical Interval Note:  12/07/2017 11:25 AM  Kim Dixon  has presented today for surgery, with the diagnosis of 3cm cyst on back  The various methods of treatment have been discussed with the patient and family. After consideration of risks, benefits and other options for treatment, the patient has consented to  Procedure(s): EXCISION 3CM CYST ON BACK (N/A) as a surgical intervention .  The patient's history has been reviewed, patient examined, no change in status, stable for surgery.  I have reviewed the patient's chart and labs.  Questions were answered to the patient's satisfaction.     Aviva Signs

## 2017-12-07 NOTE — Anesthesia Preprocedure Evaluation (Addendum)
Anesthesia Evaluation  Patient identified by MRN, date of birth, ID band Patient awake    Reviewed: Allergy & Precautions, H&P , NPO status , Patient's Chart, lab work & pertinent test results, reviewed documented beta blocker date and time   Airway Mallampati: II  TM Distance: >3 FB Neck ROM: full    Dental no notable dental hx.    Pulmonary neg pulmonary ROS, shortness of breath, former smoker,    Pulmonary exam normal breath sounds clear to auscultation       Cardiovascular Exercise Tolerance: Good hypertension, + Peripheral Vascular Disease  negative cardio ROS  + Valvular Problems/Murmurs  Rhythm:regular Rate:Normal     Neuro/Psych PSYCHIATRIC DISORDERS Anxiety Depression negative neurological ROS  negative psych ROS   GI/Hepatic negative GI ROS, Neg liver ROS,   Endo/Other  negative endocrine ROSMorbid obesity  Renal/GU negative Renal ROS  negative genitourinary   Musculoskeletal   Abdominal   Peds  Hematology negative hematology ROS (+)   Anesthesia Other Findings   Reproductive/Obstetrics negative OB ROS                            Anesthesia Physical Anesthesia Plan  ASA: III  Anesthesia Plan: MAC   Post-op Pain Management:    Induction:   PONV Risk Score and Plan:   Airway Management Planned:   Additional Equipment:   Intra-op Plan:   Post-operative Plan:   Informed Consent: I have reviewed the patients History and Physical, chart, labs and discussed the procedure including the risks, benefits and alternatives for the proposed anesthesia with the patient or authorized representative who has indicated his/her understanding and acceptance.   Dental Advisory Given  Plan Discussed with: CRNA  Anesthesia Plan Comments:         Anesthesia Quick Evaluation

## 2017-12-07 NOTE — Transfer of Care (Signed)
Immediate Anesthesia Transfer of Care Note  Patient: Kim Dixon  Procedure(s) Performed: EXCISION 3CM CYST ON BACK (N/A Back)  Patient Location: PACU  Anesthesia Type:MAC  Level of Consciousness: awake, alert  and oriented  Airway & Oxygen Therapy: Patient Spontanous Breathing  Post-op Assessment: Report given to RN  Post vital signs: Reviewed and stable  Last Vitals:  Vitals Value Taken Time  BP 138/61 12/07/2017  1:36 PM  Temp    Pulse 51 12/07/2017  1:39 PM  Resp 24 12/07/2017  1:39 PM  SpO2 97 % 12/07/2017  1:39 PM  Vitals shown include unvalidated device data.  Last Pain:  Vitals:   12/07/17 1100  PainSc: 0-No pain         Complications: No apparent anesthesia complications

## 2017-12-07 NOTE — Discharge Instructions (Signed)
Monitored Anesthesia Care, Care After  These instructions provide you with information about caring for yourself after your procedure. Your health care provider may also give you more specific instructions. Your treatment has been planned according to current medical practices, but problems sometimes occur. Call your health care provider if you have any problems or questions after your procedure.  What can I expect after the procedure?  After your procedure, it is common to:   Feel sleepy for several hours.   Feel clumsy and have poor balance for several hours.   Feel forgetful about what happened after the procedure.   Have poor judgment for several hours.   Feel nauseous or vomit.   Have a sore throat if you had a breathing tube during the procedure.    Follow these instructions at home:  For at least 24 hours after the procedure:     Do not:  ? Participate in activities in which you could fall or become injured.  ? Drive.  ? Use heavy machinery.  ? Drink alcohol.  ? Take sleeping pills or medicines that cause drowsiness.  ? Make important decisions or sign legal documents.  ? Take care of children on your own.   Rest.  Eating and drinking   Follow the diet that is recommended by your health care provider.   If you vomit, drink water, juice, or soup when you can drink without vomiting.   Make sure you have little or no nausea before eating solid foods.  General instructions   Have a responsible adult stay with you until you are awake and alert.   Take over-the-counter and prescription medicines only as told by your health care provider.   If you smoke, do not smoke without supervision.   Keep all follow-up visits as told by your health care provider. This is important.  Contact a health care provider if:   You keep feeling nauseous or you keep vomiting.   You feel light-headed.   You develop a rash.   You have a fever.  Get help right away if:   You have trouble breathing.  This information is  not intended to replace advice given to you by your health care provider. Make sure you discuss any questions you have with your health care provider.  Document Released: 06/27/2015 Document Revised: 10/27/2015 Document Reviewed: 06/27/2015  Elsevier Interactive Patient Education  2018 Elsevier Inc[employer]

## 2017-12-07 NOTE — Op Note (Signed)
Patient:  SHARANYA TEMPLIN  DOB:  February 26, 1941  MRN:  [id number]   Preop Diagnosis: Sebaceous cyst, back  Postop Diagnosis: Same  Procedure: Excision of 3 cm cyst, back  Surgeon: Aviva Signs, MD  Anes: Attending local  Indications: Patient is a 77 year old white female with a recurrent sebaceous cyst on the upper midportion of her back.  The risks and benefits of the procedure including bleeding, infection, and recurrence of the cyst were fully explained to the patient, who gave informed consent.  Procedure note: The patient was placed in the left lateral decubitus position.  The midline upper back was prepped and draped using the usual sterile technique with DuraPrep.  Surgical site confirmation was performed.  1% Xylocaine was used for local anesthesia.  A longitudinal incision was made over the sebaceous cyst.  The sebaceous cyst along with its wall was removed in total without difficulty.  It was disposed of.  The wound was irrigated with normal saline.  A bleeding was controlled using Bovie electrocautery.  The skin was reapproximated using a 3-0 nylon interrupted suture.  Betadine ointment and a dry sterile dressing were applied.  All tape and needle counts were correct at the end of the procedure.  The patient was transferred to PACU in stable condition.  Complications: None  EBL: Minimal  Specimen: None

## 2017-12-10 ENCOUNTER — Encounter (HOSPITAL_COMMUNITY): Payer: Self-pay | Admitting: General Surgery

## 2017-12-18 ENCOUNTER — Encounter: Payer: Self-pay | Admitting: General Surgery

## 2017-12-18 ENCOUNTER — Ambulatory Visit (INDEPENDENT_AMBULATORY_CARE_PROVIDER_SITE_OTHER): Payer: Self-pay | Admitting: General Surgery

## 2017-12-18 VITALS — BP 156/78 | HR 63 | Temp 97.7°F | Resp 22 | Wt 259.0 lb

## 2017-12-18 DIAGNOSIS — Z09 Encounter for follow-up examination after completed treatment for conditions other than malignant neoplasm: Secondary | ICD-10-CM

## 2017-12-18 NOTE — Progress Notes (Signed)
Subjective:     Kim Dixon  Status post excision of sebaceous cyst on her back.  She is doing well.  She has no complaints. Objective:    BP (!) 156/78 (BP Location: Left Arm, Patient Position: Sitting, Cuff Size: Large)   Pulse 63   Temp 97.7 F (36.5 C) (Temporal)   Resp (!) 22   Wt 259 lb (117.5 kg)   BMI 45.16 kg/m   General:  alert, cooperative and no distress  Back incision healing well.  Sutures removed, Steri-Strips applied.     Assessment:    Doing well postoperatively.    Plan:   Follow-up here as needed.

## 2018-05-30 ENCOUNTER — Other Ambulatory Visit (HOSPITAL_COMMUNITY): Payer: Self-pay | Admitting: Internal Medicine

## 2018-05-30 DIAGNOSIS — I639 Cerebral infarction, unspecified: Secondary | ICD-10-CM

## 2018-06-03 ENCOUNTER — Telehealth: Payer: Self-pay | Admitting: Neurology

## 2018-06-03 ENCOUNTER — Ambulatory Visit (HOSPITAL_COMMUNITY)
Admission: RE | Admit: 2018-06-03 | Discharge: 2018-06-03 | Disposition: A | Discharge status: Home / Self Care | Payer: Medicare Other | Source: Ambulatory Visit | Attending: Internal Medicine | Admitting: Internal Medicine

## 2018-06-03 ENCOUNTER — Other Ambulatory Visit: Payer: Self-pay

## 2018-06-03 DIAGNOSIS — I639 Cerebral infarction, unspecified: Secondary | ICD-10-CM | POA: Diagnosis not present

## 2018-06-03 LAB — POCT I-STAT CREATININE: Creatinine, Ser: 1.1 mg/dL — ABNORMAL HIGH (ref 0.44–1.00)

## 2018-06-03 MED ORDER — GADOBUTROL 1 MMOL/ML IV SOLN
10.0000 mL | Freq: Once | INTRAVENOUS | Status: AC | PRN
  Administered 2018-06-03: 10 mL via INTRAVENOUS

## 2018-06-04 ENCOUNTER — Encounter: Payer: Self-pay | Admitting: Neurology

## 2018-06-04 ENCOUNTER — Other Ambulatory Visit: Payer: Self-pay

## 2018-06-04 ENCOUNTER — Ambulatory Visit: Payer: Medicare Other | Admitting: Neurology

## 2018-06-04 VITALS — BP 138/69 | HR 72 | Ht 63.5 in | Wt 259.0 lb

## 2018-06-04 DIAGNOSIS — G459 Transient cerebral ischemic attack, unspecified: Secondary | ICD-10-CM | POA: Diagnosis not present

## 2018-06-04 NOTE — Telephone Encounter (Signed)
error 

## 2018-06-04 NOTE — Progress Notes (Signed)
PATIENT: Kim Dixon DOB: 06/23/1940  Chief Complaint  Patient presents with  . Transient Ischemic Attack    She is here for further evalutation of her TIA event on 03/20/2018.  Reports having word finding difficulty for about 90 minutes before the issue resolved.  She was started on aspirin 81mg  daily.  No further episodes.  She had recent MRI brain and MRA neck.  Marland Kitchen PCP    Asencion Noble, MD     HISTORICAL  Kim Dixon is a 78 year old female, seen in request by her primary care physician Dr. Asencion Noble for evaluation of transient ischemic attack, initial evaluation was on June 04, 2018.  I have reviewed and summarized the referring note from the referring physician.  She had a past medical history of hyperlipidemia, hypertension,  On March 20, 2018, she got up in her usual status, finish her routine including eating breakfast, while doing her word puzzle, she felt confused, when she tried to call her daughter, she had word finding difficulties, that was noticed by her daughter as well, she denies headache, denies lateralized motor or sensory deficit, she denied chest pain, no heart palpitation  She took a nap, when she woke up an hour later, her symptoms disappeared,  She was seen by her primary care physician in March 2020, was considered to be TIA, started on aspirin 81 mg daily  I personally reviewed MRI of the brain without contrast on June 04, 2018, mild small vessel disease no acute abnormality, normal MRA of neck.  She complains of shortness of breath with exertion, was referred to see a cardiologist, Laboratory evaluations in September 2019 showed normal CBC, BMP showed mild elevated glucose 138.  REVIEW OF SYSTEMS: Full 14 system review of systems performed and notable only for as above all other review of systems were negative.  ALLERGIES: Allergies  Allergen Reactions  . Naproxen Sodium     Tightness in throat    HOME MEDICATIONS: Current Outpatient  Medications  Medication Sig Dispense Refill  . Ascorbic Acid (VITAMIN C) 1000 MG tablet Take 3,000 mg by mouth daily.     . ASPIRIN 81 PO Take 1 tablet by mouth daily.    Marland Kitchen atorvastatin (LIPITOR) 10 MG tablet Take 10 mg by mouth at bedtime.     . bimatoprost (LUMIGAN) 0.01 % SOLN Place 1 drop into both eyes at bedtime.    . cholecalciferol (VITAMIN D) 1000 UNITS tablet Take 1,000 Units by mouth daily.     . citalopram (CELEXA) 20 MG tablet Take 20 mg by mouth every morning.     . Liniments (SALONPAS PAIN RELIEF PATCH EX) Apply 1 patch topically daily as needed (pain).    Marland Kitchen losartan (COZAAR) 100 MG tablet Take 100 mg by mouth daily.    . meloxicam (MOBIC) 7.5 MG tablet Take 7.5-15 mg by mouth daily as needed for pain.     Marland Kitchen timolol (TIMOPTIC) 0.5 % ophthalmic solution Place 1 drop into both eyes daily.    Marland Kitchen torsemide (DEMADEX) 20 MG tablet Take 10 mg by mouth daily.      No current facility-administered medications for this visit.     PAST MEDICAL HISTORY: Past Medical History:  Diagnosis Date  . Anxiety   . Arthritis   . Depression   . Glaucoma   . Heart murmur   . History of kidney stones   . Hyperlipidemia   . Hypertension   . Pneumonia    hx of   .  Stress incontinence   . TIA (transient ischemic attack)   . Urinary frequency   . Varicose veins     PAST SURGICAL HISTORY: Past Surgical History:  Procedure Laterality Date  . ABDOMINAL HYSTERECTOMY    . ABLATION SAPHENOUS VEIN W/ RFA Bilateral   . COLONOSCOPY    . COLONOSCOPY N/A 06/18/2013   Procedure: COLONOSCOPY;  Surgeon: Rogene Houston, MD;  [facility];  Service: Endoscopy;  Laterality: N/A;  830  . EYE SURGERY     laser surgery bilat   . LITHOTRIPSY    . MASS EXCISION N/A 12/07/2017   Procedure: EXCISION 3CM CYST ON BACK;  Surgeon: Aviva Signs, MD;  Location: [facility];  Service: General;  Laterality: N/A;  . TONSILLECTOMY    . 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 Coronary artery disease Mother   . Lung cancer Father   . Colon cancer Neg Hx   . Stomach cancer Neg Hx     SOCIAL HISTORY: Social History   Socioeconomic History  . Marital status: Widowed    Spouse name: Not on file  . Number of children: 3  . Years of education: college  . Highest education level: Bachelor's degree (e.g., BA, AB, BS)  Occupational History  . Occupation: Retired  Scientific laboratory technician  . Financial resource strain: Not on file  . Food insecurity:    Worry: Not on file    Inability: Not on file  . Transportation needs:    Medical: Not on file    Non-medical: Not on file  Tobacco Use  . Smoking status: Former Smoker    Packs/day: 3.00    Years: 30.00    Pack years: 90.00    Types: Cigarettes    Last attempt to quit: 04/04/1993    Years since quitting: 25.1  . Smokeless tobacco: Never Used  Substance and Sexual Activity  . Alcohol use: Yes    Comment: Nightly glass of wine  . Drug use: No  . Sexual activity: Yes    Birth control/protection: Surgical  Lifestyle  . Physical activity:    Days per week: Not on file    Minutes per session: Not on file  . Stress: Not on file  Relationships  . Social connections:    Talks on phone: Not on file    Gets together: Not on file    Attends religious service: Not on file    Active member of club or organization: Not on file    Attends meetings of clubs or organizations: Not on file    Relationship status: Not on file  . Intimate partner violence:    Fear of current or ex partner: Not on file    Emotionally abused: Not on file    Physically abused: Not on file    Forced sexual activity: Not on file  Other Topics Concern  . Not on file  Social History Narrative   Lives alone.   Right-handed.   One cup coffee per day.  Occasional soda.      PHYSICAL EXAM   Vitals:   06/04/18 1408  BP: 138/69  Pulse: 72  Weight: 259 lb (117.5 kg)  Height: 5' 3.5" (1.613 m)    Not recorded      Body mass index is  45.16 kg/m.  PHYSICAL EXAMNIATION:  Gen: NAD, conversant, well nourised, obese, well groomed                     Cardiovascular: Regular rate rhythm, no peripheral edema, warm, nontender. Eyes: Conjunctivae clear without exudates or hemorrhage Neck: Supple, no carotid bruits. Pulmonary: Clear to auscultation bilaterally   NEUROLOGICAL EXAM:  MENTAL STATUS: Speech:    Speech is normal; fluent and spontaneous with normal comprehension.  Cognition:     Orientation to time, place and person     Normal recent and remote memory     Normal Attention span and concentration     Normal Language, naming, repeating,spontaneous speech     Fund of knowledge   CRANIAL NERVES: CN II: Visual fields are full to confrontation.  Pupils are round equal and briskly reactive to light. CN III, IV, VI: extraocular movement are normal. No ptosis. CN V: Facial sensation is intact to pinprick in all 3 divisions bilaterally. Corneal responses are intact.  CN VII: Face is symmetric with normal eye closure and smile. CN VIII: Hearing is normal to rubbing fingers CN IX, X: Palate elevates symmetrically. Phonation is normal. CN XI: Head turning and shoulder shrug are intact CN XII: Tongue is midline with normal movements and no atrophy.  MOTOR: There is no pronator drift of out-stretched arms. Muscle bulk and tone are normal. Muscle strength is normal.  REFLEXES: Reflexes are 2+ and symmetric at the biceps, triceps, knees, and ankles. Plantar responses are flexor.  SENSORY: Intact to light touch, pinprick, positional sensation and vibratory sensation are intact in fingers and toes.  COORDINATION: Rapid alternating movements and fine finger movements are intact. There is no dysmetria on finger-to-nose and heel-knee-shin.    GAIT/STANCE:  Posture is normal. Gait is steady with normal steps, base, arm swing, and turning. Heel and toe walking are normal. Tandem gait is normal.  Romberg is absent.   DIAGNOSTIC DATA (LABS, IMAGING, TESTING) - I reviewed patient records, labs, notes, testing and imaging myself where available.   ASSESSMENT AND PLAN  Kim Dixon is a 78 y.o. female   Transient word finding difficulties,  Most suggestive of left frontal TIA  MRI of the brain showed no acute lesions, mild supratentorium small vessel disease, normal MRA of brain,  Complete evaluation with echocardiogram  Continue aspirin 81 mg daily,  Optimized vascular risk factor control, blood pressure less than 130/80, LDL less than 70,  Continue moderate exercise   Marcial Pacas, M.D. Ph.D.  [facility] [address] [place],  [address] Ph: [phone] Fax: [phone]  CC: Asencion Noble, MD

## 2018-06-19 ENCOUNTER — Other Ambulatory Visit: Payer: Self-pay | Admitting: Neurology

## 2018-06-19 DIAGNOSIS — G459 Transient cerebral ischemic attack, unspecified: Secondary | ICD-10-CM

## 2018-06-26 ENCOUNTER — Other Ambulatory Visit: Payer: Medicare Other

## 2018-06-27 ENCOUNTER — Telehealth: Payer: Self-pay | Admitting: *Deleted

## 2018-06-27 ENCOUNTER — Encounter: Payer: Self-pay | Admitting: *Deleted

## 2018-06-27 NOTE — Telephone Encounter (Signed)
Pt verbalized consent for telehealth appt with Dr Domenic Polite and that insurance would be billed for the encounter. Updated pt medications/allergies/pharmacy/hx. Pt was referred by Dr Willey Blade for TIA (req'd notes) pt has not recently been in hospital. Pt doesn't have BP monitor at home but would have weight available

## 2018-07-04 ENCOUNTER — Encounter: Payer: Self-pay | Admitting: Cardiology

## 2018-07-04 NOTE — Progress Notes (Signed)
Virtual Visit via Telephone Note   This visit type was conducted due to national recommendations for restrictions regarding the COVID-19 Pandemic (e.g. social distancing) in an effort to limit this patient's exposure and mitigate transmission in our community.  Due to her co-morbid illnesses, this patient is at least at moderate risk for complications without adequate follow up.  This format is felt to be most appropriate for this patient at this time.  The patient did not have access to video technology/had technical difficulties with video requiring transitioning to audio format only (telephone).  All issues noted in this document were discussed and addressed.  No physical exam could be performed with this format.  Please refer to the patient's chart for her  consent to telehealth for [facility].   Evaluation Performed:  New patient consultation  Date:  07/05/2018   ID:  Kim Dixon, DOB 1940/03/26, MRN [id number]  Patient Location: Home Provider Location: Home  PCP:  Asencion Noble, MD  Consulting Cardiologist:  Satira Sark, MD  Chief Complaint:  Shortness of breath  History of Present Illness:    Kim Dixon is a 78 y.o. female referred for cardiology consultation by Dr. Willey Blade for the evaluation of shortness of breath.  Unfortunately, she did not have video access today and we performed the consultation by phone.  I reviewed her records and updated the chart as well.  She describes dyspnea on exertion, seems to be fairly longstanding based on discussion.  Prior to the gym being closed with the COVID-19 pandemic she was exercising 4 days a week with [employer]neakers.  She does walk outdoors at this point.  She describes NYHA class II-III shortness of breath.  No exertional chest pain, palpitations, or syncope.  I saw her in the remote past, office visit noted from 2010 following cardiac catheterization which was overall reassuring as detailed below.  This was  performed in the setting of dyspnea on exertion.  She underwent neurology consultation with Dr. Krista Blue in March due to history of suspected TIA in January.  Brain MRI/MRA reviewed at that time with recommendation to continue aspirin and follow-up echocardiogram ordered (although not yet performed).  She has no known history of atrial fibrillation.  I reviewed her current medications which are outlined below.  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  The patient does not have symptoms concerning for COVID-19 infection (fever, chills, cough, or new shortness of breath).  She has been social distancing.   Past Medical History:  Diagnosis Date  . Anxiety   . Arthritis   . Depression   . Glaucoma   . Glaucoma   . History of kidney stones   . History of pneumonia   . Hyperlipidemia   . Hypertension   . Stress incontinence   . TIA (transient ischemic attack)    January 2020  . Urinary frequency   . Varicose veins    Past Surgical History:  Procedure Laterality Date  . ABDOMINAL HYSTERECTOMY    . ABLATION SAPHENOUS VEIN W/ RFA Bilateral   . COLONOSCOPY    . COLONOSCOPY N/A 06/18/2013   Procedure: COLONOSCOPY;  Surgeon: Rogene Houston, MD;  [facility];  Service: Endoscopy;  Laterality: N/A;  830  . EYE SURGERY     laser surgery bilat   . LITHOTRIPSY    . MASS EXCISION N/A 12/07/2017   Procedure: EXCISION 3CM CYST ON BACK;  Surgeon: Aviva Signs, MD;  Location:  [facility];  Service: General;  Laterality: N/A;  . TONSILLECTOMY    . 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 Ascorbic Acid (VITAMIN C) 1000 MG tablet Take 3,000 mg by mouth daily.   . ASPIRIN 81 PO Take 1 tablet by mouth daily.  Marland Kitchen atorvastatin (LIPITOR) 10 MG tablet Take 10 mg by mouth as directed. Takes M_W_F  . bimatoprost (LUMIGAN)  0.01 % SOLN Place 1 drop into both eyes at bedtime.  . cholecalciferol (VITAMIN D) 1000 UNITS tablet Take 1,000 Units by mouth daily.   . citalopram (CELEXA) 20 MG tablet Take 20 mg by mouth every morning.   . Liniments (SALONPAS PAIN RELIEF PATCH EX) Apply 1 patch topically daily as needed (pain).  Marland Kitchen losartan (COZAAR) 100 MG tablet Take 100 mg by mouth daily.  . meloxicam (MOBIC) 7.5 MG tablet Take 7.5-15 mg by mouth daily as needed for pain.   Marland Kitchen timolol (TIMOPTIC) 0.5 % ophthalmic solution Place 1 drop into both eyes daily.  Marland Kitchen torsemide (DEMADEX) 20 MG tablet Take 10 mg by mouth daily.      Allergies:   Naproxen sodium   Social History   Tobacco Use  . Smoking status: Former Smoker    Packs/day: 3.00    Years: 30.00    Pack years: 90.00    Types: Cigarettes    Last attempt to quit: 04/04/1993    Years since quitting: 25.2  . Smokeless tobacco: Never Used  Substance Use Topics  . Alcohol use: Yes    Comment: Nightly glass of wine  . Drug use: No     Family Hx: The patient's family history includes Coronary artery disease in her mother; Lung cancer in her father. There is no history of Colon cancer or Stomach cancer.  ROS:   Please see the history of present illness.    Arthritic symptoms. All other systems reviewed and are negative.   Prior CV studies:   The following studies were reviewed today:  Cardiac catheterization 01/01/2009: Normal left main, normal LAD, minimal luminal irregularities within the circumflex, normal RCA.  LVEF 60% by ventriculography. Mean RV pressure 11 mmHg. Pulmonary artery systolic pressure 42 mmHg. Pulmonary capillary wedge pressure 20 mmHg. LVEDP 24 mmHg. Cardiac output and index of 6.2 and 2.7 respectively.  Brain MRI and MRA March 2020: IMPRESSION: 1. Mild chronic small vessel disease.  No acute or chronic infarct. 2. Normal MRA of the neck.  Labs/Other Tests and Data Reviewed:    EKG:   I personally reviewed the tracing from  12/05/2017 which showed sinus rhythm with low voltage.  Recent Labs: 12/05/2017: BUN 20; Hemoglobin 12.8; Platelets 151; Potassium 3.9; Sodium 143 06/03/2018: Creatinine, Ser 1.28 May 2018: BUN 13, creatinine 0.76, potassium 4.0, hemoglobin A1c 6.6%  Wt Readings from Last 3 Encounters:  07/05/18 255 lb (115.7 kg)  06/04/18 259 lb (117.5 kg)  12/18/17 259 lb (117.5 kg)     Objective:    Vital Signs:  BP (!) 143/58   Pulse 67   Ht 5' 3.5" (1.613 m)   Wt 255 lb (115.7 kg)   BMI 44.46 kg/m    Questions spontaneously on the phone. Voice tone was normal. She was not breathless while speaking in full sentences.  ASSESSMENT & PLAN:    1.  Dyspnea on exertion,  fairly longstanding based on discussion.  She underwent previous cardiac work-up 10 years ago and at that time had essentially normal coronary arteries.  Right heart catheterization suggested diastolic dysfunction.  She has not undergone interval cardiac testing.  She mentions to me diagnosis of heart murmur as well.  Plan is to proceed with an echocardiogram to assess cardiac structure and function as well as valvular status.  2.  History of TIA in January as discussed above.  She is currently on aspirin and statin.  No obvious history of arrhythmia.  We will obtain a 14-day ZIO patch to screen for atrial fibrillation.  MRA of the neck was normal.  3.  Essential hypertension.  Continues on Cozaar with follow-up per Dr. Willey Blade.  4.  Mixed hyperlipidemia, on Lipitor.  COVID-19 Education: The signs and symptoms of COVID-19 were discussed with the patient and how to seek care for testing (follow up with PCP or arrange E-visit).  The importance of social distancing was discussed today.  Time:   Today, I have spent 12 minutes with the patient with telehealth technology discussing the above problems.     Medication Adjustments/Labs and Tests Ordered: Current medicines are reviewed at length with the patient today.  Concerns  regarding medicines are outlined above.   Tests Ordered: Orders Placed This Encounter  Procedures  . Cardiac event monitor  . ECHOCARDIOGRAM COMPLETE    Medication Changes: No orders of the defined types were placed in this encounter.   Disposition:  Follow up test results and determine next step  Signed, Rozann Lesches, MD  07/05/2018 10:07 AM    [place]

## 2018-07-05 ENCOUNTER — Telehealth: Payer: Self-pay | Admitting: Cardiology

## 2018-07-05 ENCOUNTER — Encounter: Payer: Self-pay | Admitting: Cardiology

## 2018-07-05 ENCOUNTER — Telehealth (INDEPENDENT_AMBULATORY_CARE_PROVIDER_SITE_OTHER): Payer: Medicare Other | Admitting: Cardiology

## 2018-07-05 VITALS — BP 143/58 | HR 67 | Ht 63.5 in | Wt 255.0 lb

## 2018-07-05 DIAGNOSIS — Z7189 Other specified counseling: Secondary | ICD-10-CM

## 2018-07-05 DIAGNOSIS — Z8673 Personal history of transient ischemic attack (TIA), and cerebral infarction without residual deficits: Secondary | ICD-10-CM

## 2018-07-05 DIAGNOSIS — I10 Essential (primary) hypertension: Secondary | ICD-10-CM | POA: Diagnosis not present

## 2018-07-05 DIAGNOSIS — R0602 Shortness of breath: Secondary | ICD-10-CM | POA: Diagnosis not present

## 2018-07-05 DIAGNOSIS — R011 Cardiac murmur, unspecified: Secondary | ICD-10-CM

## 2018-07-05 NOTE — Patient Instructions (Signed)
Medication Instructions: Your physician recommends that you continue on your current medications as directed. Please refer to the Current Medication list given to you today.   Labwork: None today  Procedures/Testing: Your physician has recommended that you wear an event monitor. Event monitors are medical devices that record the heart's electrical activity. Doctors most often [place] these monitors to diagnose arrhythmias. Arrhythmias are problems with the speed or rhythm of the heartbeat. The monitor is a small, portable device. You can wear one while you do your normal daily activities. This is usually used to diagnose what is causing palpitations/syncope (passing out).  Your physician has requested that you have an echocardiogram. Echocardiography is a painless test that uses sound waves to create images of your heart. It provides your doctor with information about the size and shape of your heart and how well your heart's chambers and valves are working. This procedure takes approximately one hour. There are no restrictions for this procedure.    Follow-Up: To be determined  Any Additional Special Instructions Will Be Listed Below (If Applicable).     If you need a refill on your cardiac medications before your next appointment, please call your pharmacy.       Thank you for choosing [facility] !

## 2018-07-05 NOTE — Telephone Encounter (Signed)
°  Precert needed for:  Event Monitor

## 2018-07-09 ENCOUNTER — Ambulatory Visit (INDEPENDENT_AMBULATORY_CARE_PROVIDER_SITE_OTHER): Payer: Medicare Other

## 2018-07-09 DIAGNOSIS — I4891 Unspecified atrial fibrillation: Secondary | ICD-10-CM

## 2018-07-23 ENCOUNTER — Telehealth: Payer: Self-pay | Admitting: Cardiology

## 2018-07-23 ENCOUNTER — Telehealth: Payer: Self-pay

## 2018-07-23 ENCOUNTER — Other Ambulatory Visit: Payer: Self-pay | Admitting: *Deleted

## 2018-07-23 DIAGNOSIS — Z8673 Personal history of transient ischemic attack (TIA), and cerebral infarction without residual deficits: Secondary | ICD-10-CM

## 2018-07-23 MED ORDER — APIXABAN 5 MG PO TABS: 5.0000 mg | ORAL_TABLET | Freq: Two times a day (BID) | ORAL | 11 refills | Status: DC

## 2018-07-23 NOTE — Addendum Note (Signed)
Addended by: Barbarann Ehlers A on: 07/23/2018 11:07 AM   Modules accepted: Orders

## 2018-07-23 NOTE — Telephone Encounter (Signed)
I was sent email communication today regarding Zio patch result from today documenting atrial fibrillation (new diagnosis). She has a CHADSVASC score of 6 including prior TIA and I would suggest starting Eliquis 5 mg BID (stop ASA in that case). I reviewed her most recent lab work. I do not see that the echocardiogram ordered at our recent telehealth new consultation has yet been completed - please make sure that this is scheduled soon. She will need a follow-up telehealth visit within a month. Please ask her to track HR/BP to see if we need to adjust medications. Suspect she is having paroxysmal atrial fibrillation. Please scan the monitor report into Epic so it can be viewed.

## 2018-07-23 NOTE — Telephone Encounter (Signed)
Received call from Zio rep that Kim Dixon was noted to be in Afib at 5:05 AM today with HR ranging 98-134 bpm    Rhythm strip downloaded and emailed to [place]

## 2018-07-23 NOTE — Telephone Encounter (Signed)

## 2018-07-23 NOTE — Telephone Encounter (Signed)
Patient has echo tomorrow 07/24/18 at the [place] office.She will make f/u apt after echo.I asked her to stop ASA and start Eliquis 5 mg BID. She will aslo track her BP/HR at home. She returns monitor today.  She will receive free 30 day trail of Eliquis, e-scribed to [employer]

## 2018-07-24 ENCOUNTER — Ambulatory Visit (INDEPENDENT_AMBULATORY_CARE_PROVIDER_SITE_OTHER): Payer: Medicare Other

## 2018-07-24 ENCOUNTER — Telehealth: Payer: Self-pay | Admitting: *Deleted

## 2018-07-24 ENCOUNTER — Other Ambulatory Visit: Payer: Self-pay

## 2018-07-24 DIAGNOSIS — R0602 Shortness of breath: Secondary | ICD-10-CM | POA: Diagnosis not present

## 2018-07-24 DIAGNOSIS — G459 Transient cerebral ischemic attack, unspecified: Secondary | ICD-10-CM

## 2018-07-24 NOTE — Progress Notes (Signed)
Please call patient, ECHO showed no significant abnormalities.

## 2018-07-24 NOTE — Telephone Encounter (Signed)
Spoke to patient and she is aware of her ECHO results.

## 2018-07-24 NOTE — Telephone Encounter (Signed)
-----   Message from Marcial Pacas, MD sent at 07/24/2018  4:40 PM EDT ----- Please call patient, ECHO showed no significant abnormalities.

## 2018-07-29 ENCOUNTER — Telehealth: Payer: Self-pay | Admitting: Cardiology

## 2018-07-29 NOTE — Telephone Encounter (Signed)
Please have her hold Eliquis for 2 days, and then try and resume at 2.5 mg twice daily.

## 2018-07-29 NOTE — Telephone Encounter (Signed)
Has blood in urine since yesterday(pink) , epistaxis saturday 5/9 and Sunday 5/10 lasting 19 minutes    Did not take Eliquis today, please advise

## 2018-07-29 NOTE — Telephone Encounter (Signed)
Pt started Eliquis last week on Wednesday and is now having a reaction to it.   Please give pt a call @ [phone]

## 2018-07-29 NOTE — Telephone Encounter (Signed)
Pt will hold eliquis 5 mg for 2 days, then come by here for 2.5 mg samples

## 2018-08-13 ENCOUNTER — Telehealth: Payer: Self-pay | Admitting: Cardiology

## 2018-08-13 NOTE — Telephone Encounter (Signed)
Held eliquis for 2 days as directed and did not see blood in urine. Patient says she started eliquis 2.5 mg twice daily on 07/31/2018 and saw blood in urine this weekend which was pink tinged and is now a rose red color. Patient did take eliquis 2.5 mg this morning. Has office visit on 08/20/2018 with PCP. Advised to hold eliquis until she hears back from our office and message would be send to her provider. Verbalized understanding.

## 2018-08-13 NOTE — Telephone Encounter (Signed)
Per phone call from pt- she's been having blood in her urine again. She noticed a pink tint this weekend and it's now gotten darker since taking the apixaban (ELIQUIS) 5 MG TABS tablet [[id number]]

## 2018-08-13 NOTE — Telephone Encounter (Signed)
Agree, we can hold Eliquis for now, but I think that she is going to need further work-up with PCP and possibly even a urologist.  She is going to need a urinalysis may need further imaging/assessment of the kidney/bladder.

## 2018-08-13 NOTE — Telephone Encounter (Signed)
Patient informed and verbalized understanding of plan. Copy sent to PCP 

## 2018-08-19 ENCOUNTER — Other Ambulatory Visit (HOSPITAL_COMMUNITY): Payer: Self-pay | Admitting: Internal Medicine

## 2018-08-19 DIAGNOSIS — Z1231 Encounter for screening mammogram for malignant neoplasm of breast: Secondary | ICD-10-CM

## 2018-08-21 ENCOUNTER — Ambulatory Visit (HOSPITAL_COMMUNITY)
Admission: RE | Admit: 2018-08-21 | Discharge: 2018-08-21 | Disposition: A | Discharge status: Home / Self Care | Payer: Medicare Other | Source: Ambulatory Visit | Attending: Internal Medicine | Admitting: Internal Medicine

## 2018-08-21 ENCOUNTER — Other Ambulatory Visit: Payer: Self-pay

## 2018-08-21 DIAGNOSIS — Z1231 Encounter for screening mammogram for malignant neoplasm of breast: Secondary | ICD-10-CM | POA: Diagnosis not present

## 2018-08-22 ENCOUNTER — Other Ambulatory Visit: Payer: Self-pay

## 2018-08-22 DIAGNOSIS — I4891 Unspecified atrial fibrillation: Secondary | ICD-10-CM

## 2018-08-23 ENCOUNTER — Other Ambulatory Visit: Payer: Self-pay | Admitting: Internal Medicine

## 2018-08-23 ENCOUNTER — Other Ambulatory Visit (HOSPITAL_COMMUNITY): Payer: Self-pay | Admitting: Internal Medicine

## 2018-08-23 DIAGNOSIS — R319 Hematuria, unspecified: Secondary | ICD-10-CM

## 2018-08-27 ENCOUNTER — Telehealth: Payer: Self-pay | Admitting: Cardiology

## 2018-08-27 NOTE — Telephone Encounter (Signed)
Left message to return call 

## 2018-08-27 NOTE — Telephone Encounter (Signed)
Would like to know if her appointment with Dr Domenic Polite on September 03, 2018 is necessary since she is no longer on Eliquis

## 2018-08-27 NOTE — Telephone Encounter (Signed)
Message sent to provider for decision.

## 2018-08-27 NOTE — Telephone Encounter (Signed)
I am certainly happy to see her.  It may be good if we talked out what her options are now since she had some difficulties with Eliquis.

## 2018-08-28 NOTE — Telephone Encounter (Signed)
Patient notified and verbalized understanding.  She will keep appointment as scheduled.

## 2018-08-28 NOTE — Telephone Encounter (Signed)
Returning call.

## 2018-09-02 NOTE — Progress Notes (Signed)
Virtual Visit via Telephone Note   This visit type was conducted due to national recommendations for restrictions regarding the COVID-19 Pandemic (e.g. social distancing) in an effort to limit this patient's exposure and mitigate transmission in our community.  Due to her co-morbid illnesses, this patient is at least at moderate risk for complications without adequate follow up.  This format is felt to be most appropriate for this patient at this time.  The patient did not have access to video technology/had technical difficulties with video requiring transitioning to audio format only (telephone).  All issues noted in this document were discussed and addressed.  No physical exam could be performed with this format.  Please refer to the patient's chart for her  consent to telehealth for [facility].   Date:  09/03/2018   ID:  Kim Dixon, DOB February 27, 1941, MRN [id number]  Patient Location: Home Provider Location: Office  PCP:  Asencion Noble, MD  Cardiologist:  Rozann Lesches, MD Electrophysiologist:  None   Evaluation Performed:  Follow-Up Visit  Chief Complaint:   Cardiac follow-up  History of Present Illness:    Kim Dixon is a 78 y.o. female last assessed by telehealth encounter in April.  She was referred for follow-up cardiac testing at that time in light of chronic dyspnea on exertion and also a history of TIA in January.  Follow-up echocardiogram revealed normal LVEF at 60 to 65% with no major valvular abnormalities.  Cardiac monitor did demonstrate paroxysmal atrial fibrillation and it was recommended that she start on Eliquis for stroke prophylaxis, CHADSVASC score of 6. Unfortunately, she did develop recurring hematuria, Eliquis was originally cut to 2.5 mg twice daily, but ultimately discontinued.  At the present time she is back on aspirin at low dose and is undergoing further imaging studies of her urinary tract and to evaluate for kidney stones by Dr. Willey Blade.  She  did not have video access and we spoke by phone today.  She does not report any new symptoms, states that she actually felt well when she was on Eliquis. 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  She recently had her granddaughter there for a week to do some crafting.  I reviewed her remaining medications which are outlined below.  The patient does not have symptoms concerning for COVID-19 infection (fever, chills, cough, or new shortness of breath).    Past Medical History:  Diagnosis Date  . Anxiety   . Arthritis   . Depression   . Glaucoma   . Glaucoma   . History of kidney stones   . History of pneumonia   . Hyperlipidemia   . Hypertension   . Stress incontinence   . TIA (transient ischemic attack)    January 2020  . Urinary frequency   . Varicose veins    Past Surgical History:  Procedure Laterality Date  . ABDOMINAL HYSTERECTOMY    . ABLATION SAPHENOUS VEIN W/ RFA Bilateral   . COLONOSCOPY    . COLONOSCOPY N/A 06/18/2013   Procedure: COLONOSCOPY;  Surgeon: Rogene Houston, MD;  Location: [facility];  Service: Endoscopy;  Laterality: N/A;  830  . EYE SURGERY     laser surgery bilat   . LITHOTRIPSY    . MASS EXCISION N/A 12/07/2017   Procedure: EXCISION 3CM CYST ON BACK;  Surgeon: Aviva Signs, MD;  Location: [facility] ORS;  Service: General;  Laterality: N/A;  . TONSILLECTOMY    . TOTAL KNEE ARTHROPLASTY Left  08/25/2014   Procedure: LEFT TOTAL KNEE ARTHROPLASTY;  Surgeon: Paralee Cancel, MD;  Location: [facility] ORS;  Service: Orthopedics;  Laterality: Left;     Current Meds  Medication Sig  . Ascorbic Acid (VITAMIN C) 1000 MG tablet Take 3,000 mg by mouth daily.   Marland Kitchen aspirin EC 81 MG tablet Take 81 mg by mouth daily.  Marland Kitchen atorvastatin (LIPITOR) 10 MG tablet Take 10 mg by mouth as directed. Takes M_W_F  . bimatoprost (LUMIGAN) 0.01 % SOLN Place 1 drop into both eyes at bedtime.  . cholecalciferol (VITAMIN D) 1000 UNITS tablet Take 1,000 Units by mouth daily.   .  citalopram (CELEXA) 20 MG tablet Take 20 mg by mouth every morning.   . Liniments (SALONPAS PAIN RELIEF PATCH EX) Apply 1 patch topically daily as needed (pain).  Marland Kitchen losartan (COZAAR) 100 MG tablet Take 100 mg by mouth daily.  . timolol (TIMOPTIC) 0.5 % ophthalmic solution Place 1 drop into both eyes daily.  Marland Kitchen torsemide (DEMADEX) 20 MG tablet Take 10 mg by mouth daily.      Allergies:   Naproxen sodium   Social History   Tobacco Use  . Smoking status: Former Smoker    Packs/day: 3.00    Years: 30.00    Pack years: 90.00    Types: Cigarettes    Quit date: 04/04/1993    Years since quitting: 25.4  . Smokeless tobacco: Never Used  Substance Use Topics  . Alcohol use: Yes    Comment: Nightly glass of wine  . Drug use: No     Family Hx: The patient's family history includes Coronary artery disease in her mother; Lung cancer in her father. There is no history of Colon cancer or Stomach cancer.  ROS:   Please see the history of present illness. All other systems reviewed and are negative.   Prior CV studies:   The following studies were reviewed today:  Cardiac catheterization 01/01/2009: Normal left main, normal LAD, minimal luminal irregularities within the circumflex, normal RCA.  LVEF 60% by ventriculography. Mean RV pressure 11 mmHg. Pulmonary artery systolic pressure 42 mmHg. Pulmonary capillary wedge pressure 20 mmHg. LVEDP 24 mmHg. Cardiac output and index of 6.2 and 2.7 respectively.  Brain MRI and MRA March 2020: IMPRESSION: 1. Mild chronic small vessel disease. No acute or chronic infarct. 2. Normal MRA of the neck.  Event recorder 08/22/2018: 14-day event recorder reviewed.  Heart rate ranged from 46 bpm up to 109 bpm with average heart rate 63 bpm while in sinus rhythm.  Atrial fibrillation was documented on May 5th, 6-hour episode that began at 5:03 AM.  Episode of junctional rhythm with bradycardia noted at 1:34 PM on May 5th as well.  There were no other  sustained arrhythmias, brief bursts of SVT and a brief episode of NSVT.  No pauses.  Echocardiogram 07/24/2018:  1. The left ventricle has normal systolic function with an ejection fraction of 60-65%. The cavity size was normal. Left ventricular diastolic parameters were normal.  2. The right ventricle has normal systolic function. The cavity was normal. There is no increase in right ventricular wall thickness. Right ventricular systolic pressure normal with an estimated pressure of 18.5 mmHg.  3. The aortic valve is tricuspid. Mild aortic annular calcification noted.  4. The mitral valve is grossly normal. There is moderate to severe mitral annular calcification present. There is mild mitral regurgitation.  5. The tricuspid valve is grossly normal.  6. The aortic root is normal in size  and structure.  Labs/Other Tests and Data Reviewed:    EKG:  An ECG dated 12/05/2017 was personally reviewed today and demonstrated:  Sinus rhythm with low voltage.  Recent Labs: 12/05/2017: BUN 20; Hemoglobin 12.8; Platelets 151; Potassium 3.9; Sodium 143 06/03/2018: Creatinine, Ser 1.10   Wt Readings from Last 3 Encounters:  07/05/18 255 lb (115.7 kg)  06/04/18 259 lb (117.5 kg)  12/18/17 259 lb (117.5 kg)     Objective:    Vital Signs:  Ht 5' 3.5" (1.613 m)   BMI 44.46 kg/m    Patient was not able to get her blood pressure cuff to work this morning. She spoke in full sentences on the phone, not short of breath. No audible wheezing or coughing. Speech pattern normal.  ASSESSMENT & PLAN:    1.  Paroxysmal atrial fibrillation documented by cardiac monitoring with prior history of TIA in January.  CHADSVASC score is 6.  She tolerated Eliquis with the exception of recurring hematuria and is now back on aspirin pending further work-up.  She states that she had a urinalysis with Dr. Willey Blade and has other pending imaging studies of the urinary tract.  If there is a reversible cause such as kidney stone that  could be removed, it would be worth considering ultimately resuming Eliquis at 2.5 mg twice daily and then advance as tolerated.  Aspirin should be stopped if that does occur.  2.  Essential hypertension, no changes made to current regimen.  She continues on Cozaar with follow-up by Dr. Willey Blade.  3.  Mixed hyperlipidemia, continues on Lipitor without intolerances.  COVID-19 Education: The signs and symptoms of COVID-19 were discussed with the patient and how to seek care for testing (follow up with PCP or arrange E-visit).  =The importance of social distancing was discussed today.  Time:   Today, I have spent 9 minutes with the patient with telehealth technology discussing the above problems.     Medication Adjustments/Labs and Tests Ordered: Current medicines are reviewed at length with the patient today.  Concerns regarding medicines are outlined above.   Tests Ordered: No orders of the defined types were placed in this encounter.   Medication Changes: No orders of the defined types were placed in this encounter.   Follow Up:  In Person 6 to 8 weeks in the [place] office.  Signed, Rozann Lesches, MD  09/03/2018 9:03 AM    [place]

## 2018-09-03 ENCOUNTER — Encounter: Payer: Self-pay | Admitting: Cardiology

## 2018-09-03 ENCOUNTER — Telehealth (INDEPENDENT_AMBULATORY_CARE_PROVIDER_SITE_OTHER): Payer: Medicare Other | Admitting: Cardiology

## 2018-09-03 VITALS — Ht 63.5 in

## 2018-09-03 DIAGNOSIS — R0602 Shortness of breath: Secondary | ICD-10-CM

## 2018-09-03 DIAGNOSIS — I10 Essential (primary) hypertension: Secondary | ICD-10-CM

## 2018-09-03 DIAGNOSIS — E782 Mixed hyperlipidemia: Secondary | ICD-10-CM

## 2018-09-03 DIAGNOSIS — Z8673 Personal history of transient ischemic attack (TIA), and cerebral infarction without residual deficits: Secondary | ICD-10-CM

## 2018-09-03 DIAGNOSIS — I480 Paroxysmal atrial fibrillation: Secondary | ICD-10-CM | POA: Diagnosis not present

## 2018-09-03 NOTE — Patient Instructions (Addendum)
Medication Instructions:   Your physician recommends that you continue on your current medications as directed. Please refer to the Current Medication list given to you today.  Labwork:  NONE  Testing/Procedures:  NONE  Follow-Up:  Your physician recommends that you schedule a follow-up appointment in: 6-8 weeks at the [place] office.   Any Other Special Instructions Will Be Listed Below (If Applicable).  If you need a refill on your cardiac medications before your next appointment, please call your pharmacy.

## 2018-09-06 ENCOUNTER — Other Ambulatory Visit: Payer: Self-pay

## 2018-09-06 ENCOUNTER — Encounter (HOSPITAL_COMMUNITY): Payer: Self-pay

## 2018-09-06 ENCOUNTER — Ambulatory Visit (HOSPITAL_COMMUNITY)
Admission: RE | Admit: 2018-09-06 | Discharge: 2018-09-06 | Disposition: A | Discharge status: Home / Self Care | Payer: Medicare Other | Source: Ambulatory Visit | Attending: Internal Medicine | Admitting: Internal Medicine

## 2018-09-06 DIAGNOSIS — R319 Hematuria, unspecified: Secondary | ICD-10-CM | POA: Diagnosis present

## 2018-09-06 LAB — POCT I-STAT CREATININE: Creatinine, Ser: 0.8 mg/dL (ref 0.44–1.00)

## 2018-09-06 MED ORDER — IOHEXOL 300 MG/ML  SOLN
125.0000 mL | Freq: Once | INTRAMUSCULAR | Status: AC | PRN
  Administered 2018-09-06: 125 mL via INTRAVENOUS

## 2018-09-11 ENCOUNTER — Telehealth: Payer: Self-pay | Admitting: Cardiology

## 2018-09-11 NOTE — Telephone Encounter (Signed)
Returned pt call. She states that she was on eliquis 5 mg- bid, had some bleeding issues and was decreased to 2.5 mg - bid. Last Friday it was discovered that she has an 8 mm kidney stone lodged in kidney and is only on low dose asa 81 mg. She does not see Dr. Tresa Moore about this until July 14. Pt wants to know if she should go back on Eliquis or stay on the low dose asa for now. Please advise.

## 2018-09-11 NOTE — Telephone Encounter (Signed)
Needs to know what she needs to do about her Eliquis medicaiton . She is not see Dr Tresa Moore until October 01, 2018

## 2018-09-11 NOTE — Telephone Encounter (Signed)
   If still having hematuria, would continue to hold Eliquis for now. Per Dr. Myles Gip last note, would plan to resume once her kidney stone is addressed. Continue ASA 81mg  daily for now. Once this has been addressed, would then stop ASA and restart Eliquis once safe from Urology's perspective as she did have a TIA earlier this year.   Signed, Erma Heritage, PA-C 09/11/2018, 11:30 AM Pager: [phone]

## 2018-09-11 NOTE — Telephone Encounter (Signed)
Pt made aware, voiced understanding of plan. She will keep updated on kidney stone issue.

## 2018-10-04 ENCOUNTER — Other Ambulatory Visit: Payer: Self-pay | Admitting: Urology

## 2018-10-15 ENCOUNTER — Other Ambulatory Visit: Payer: Self-pay

## 2018-10-15 ENCOUNTER — Telehealth: Payer: Self-pay | Admitting: Cardiology

## 2018-10-15 ENCOUNTER — Encounter (HOSPITAL_COMMUNITY): Payer: Self-pay

## 2018-10-15 ENCOUNTER — Other Ambulatory Visit (HOSPITAL_COMMUNITY)
Admission: RE | Admit: 2018-10-15 | Discharge: 2018-10-15 | Disposition: A | Discharge status: Home / Self Care | Payer: Medicare Other | Source: Ambulatory Visit | Attending: Urology | Admitting: Urology

## 2018-10-15 DIAGNOSIS — Z20828 Contact with and (suspected) exposure to other viral communicable diseases: Secondary | ICD-10-CM | POA: Insufficient documentation

## 2018-10-15 LAB — SARS CORONAVIRUS 2 (TAT 6-24 HRS): SARS Coronavirus 2: NEGATIVE

## 2018-10-15 NOTE — Progress Notes (Signed)
The following are in epic: Last office visit note 09/03/2018 ECHO5/08/2018 EKG 11/2017

## 2018-10-15 NOTE — Telephone Encounter (Signed)
Patient does not have a scale or bp cuff that works - she will have her medication ready     Virtual Visit Pre-Appointment Phone Call  "(Name), I am calling you today to discuss your upcoming appointment. We are currently trying to limit exposure to the virus that causes COVID-19 by seeing patients at home rather than in the office."  1. "What is the BEST phone number to call the day of the visit?" - include this in appointment notes  2. Do you have or have access to (through a family member/friend) a smartphone with video capability that we can use for your visit?" a. If yes - list this number in appt notes as cell (if different from BEST phone #) and list the appointment type as a VIDEO visit in appointment notes b. If no - list the appointment type as a PHONE visit in appointment notes  3. Confirm consent - "In the setting of the current Covid19 crisis, you are scheduled for a (phone or video) visit with your provider on (date) at (time).  Just as we do with many in-office visits, in order for you to participate in this visit, we must obtain consent.  If you'd like, I can send this to your mychart (if signed up) or email for you to review.  Otherwise, I can obtain your verbal consent now.  All virtual visits are billed to your insurance company just like a normal visit would be.  By agreeing to a virtual visit, we'd like you to understand that the technology does not allow for your provider to perform an examination, and thus may limit your provider's ability to fully assess your condition. If your provider identifies any concerns that need to be evaluated in person, we will make arrangements to do so.  Finally, though the technology is pretty good, we cannot assure that it will always work on either your or our end, and in the setting of a video visit, we may have to convert it to a phone-only visit.  In either situation, we cannot ensure that we have a secure connection.  Are you willing to  proceed?" STAFF: Did the patient verbally acknowledge consent to telehealth visit? Document YES/NO here: yes  4. Advise patient to be prepared - "Two hours prior to your appointment, go ahead and check your blood pressure, pulse, oxygen saturation, and your weight (if you have the equipment to check those) and write them all down. When your visit starts, your provider will ask you for this information. If you have an Apple Watch or Kardia device, please plan to have heart rate information ready on the day of your appointment. Please have a pen and paper handy nearby the day of the visit as well."  5. Give patient instructions for MyChart download to smartphone OR [email or web address] as below if video visit (depending on what platform provider is using)  6. Inform patient they will receive a phone call 15 minutes prior to their appointment time (may be from unknown caller ID) so they should be prepared to answer    TELEPHONE CALL NOTE  BRENNAN KARAM has been deemed a candidate for a follow-up tele-health visit to limit community exposure during the Covid-19 pandemic. I spoke with the patient via phone to ensure availability of phone/video source, confirm preferred email & phone number, and discuss instructions and expectations.  I reminded [name] to be prepared with any vital sign and/or heart rhythm information that could potentially be obtained via  home monitoring, at the time of her visit. I reminded [name] to expect a phone call prior to her visit.  Howie Ill 10/15/2018 1:15 PM   INSTRUCTIONS FOR DOWNLOADING THE MYCHART APP TO SMARTPHONE  - The patient must first make sure to have activated MyChart and know their login information - If Apple, go to [employer] and type in MyChart in the search bar and download the app. If Android, ask patient to go to [employer] and type in [place] in the search bar and download the app. The app is free but as with any  other app downloads, their phone may require them to verify saved payment information or Apple/Android password.  - The patient will need to then log into the app with their MyChart username and password, and select [facility] as their healthcare provider to link the account. When it is time for your visit, go to the MyChart app, find appointments, and click Begin Video Visit. Be sure to Select Allow for your device to access the Microphone and Camera for your visit. You will then be connected, and your provider will be with you shortly.  **If they have any issues connecting, or need assistance please contact MyChart service desk [phone])**  **If using a computer, in order to ensure the best quality for their visit they will need to use either of the following Internet Browsers: [employer], or Google Chrome**  IF USING DOXIMITY or DOXY.ME - The patient will receive a link just prior to their visit by text.     FULL LENGTH CONSENT FOR TELE-HEALTH VISIT   I hereby voluntarily request, consent and authorize [facility] and its employed or contracted physicians, physician assistants, nurse practitioners or other licensed health care professionals (the Practitioner), to provide me with telemedicine health care services (the Services") as deemed necessary by the treating Practitioner. I acknowledge and consent to receive the Services by the Practitioner via telemedicine. I understand that the telemedicine visit will involve communicating with the Practitioner through live audiovisual communication technology and the disclosure of certain medical information by electronic transmission. I acknowledge that I have been given the opportunity to request an in-person assessment or other available alternative prior to the telemedicine visit and am voluntarily participating in the telemedicine visit.  I understand that I have the right to withhold or withdraw my consent to the use of  telemedicine in the course of my care at any time, without affecting my right to future care or treatment, and that the Practitioner or I may terminate the telemedicine visit at any time. I understand that I have the right to inspect all information obtained and/or recorded in the course of the telemedicine visit and may receive copies of available information for a reasonable fee.  I understand that some of the potential risks of receiving the Services via telemedicine include:   Delay or interruption in medical evaluation due to technological equipment failure or disruption;  Information transmitted may not be sufficient (e.g. poor resolution of images) to allow for appropriate medical decision making by the Practitioner; and/or   In rare instances, security protocols could fail, causing a breach of personal health information.  Furthermore, I acknowledge that it is my responsibility to provide information about my medical history, conditions and care that is complete and accurate to the best of my ability. I acknowledge that Practitioner's advice, recommendations, and/or decision may be based on factors not within their control, such as incomplete  or inaccurate data provided by me or distortions of diagnostic images or specimens that may result from electronic transmissions. I understand that the practice of medicine is not an exact science and that Practitioner makes no warranties or guarantees regarding treatment outcomes. I acknowledge that I will receive a copy of this consent concurrently upon execution via email to the email address I last provided but may also request a printed copy by calling the office of [facility].    I understand that my insurance will be billed for this visit.   I have read or had this consent read to me.  I understand the contents of this consent, which adequately explains the benefits and risks of the Services being provided via telemedicine.   I have been  provided ample opportunity to ask questions regarding this consent and the Services and have had my questions answered to my satisfaction.  I give my informed consent for the services to be provided through the use of telemedicine in my medical care  By participating in this telemedicine visit I agree to the above.

## 2018-10-15 NOTE — Patient Instructions (Signed)
DUE TO COVID-19 ONLY ONE VISITOR IS ALLOWED IN THE HOSPITAL AT THIS TIME   COVID SWAB TESTING COMPLETED ON: October 15, 2018 [place], [place] (Must self quarantine after testing. Follow instructions on handout.)             Your procedure is scheduled on: Friday, October 18, 2018   Report to [facility] Main  Entrance    Report to admitting at 7:00 AM   Call this number if you have problems the morning of surgery [phone]   Do not eat food or drink liquids :After Midnight.   Brush your teeth the morning of surgery.   Do NOT smoke after Midnight   Take these medicines the morning of surgery with A SIP OF WATER: Atorvastatin, Citalopram   May use eye drops the morning of surgery  DO NOT TAKE ANY DIABETIC MEDICATIONS DAY OF YOUR SURGERY                               You may not have any metal on your body including hair pins, jewelry, and body piercings             Do not wear make-up, lotions, powders, perfumes/cologne, or deodorant             Do not wear nail polish.  Do not shave  48 hours prior to surgery.                Do not bring valuables to the hospital. [place].   Contacts, dentures or bridgework may not be worn into surgery.    Patients discharged the day of surgery will not be allowed to drive home.   Special Instructions: Bring a copy of your healthcare power of attorney and living will documents         the day of surgery if you haven't scanned them in before.              Please read over the following fact sheets you were given:  [facility] - Preparing for Surgery Before surgery, you can play an important role.  Because skin is not sterile, your skin needs to be as free of germs as possible.  You can reduce the number of germs on your skin by washing with CHG (chlorahexidine gluconate) soap before surgery.  CHG is an antiseptic cleaner which kills germs and bonds with the skin to  continue killing germs even after washing. Please DO NOT use if you have an allergy to CHG or antibacterial soaps.  If your skin becomes reddened/irritated stop using the CHG and inform your nurse when you arrive at Short Stay. Do not shave (including legs and underarms) for at least 48 hours prior to the first CHG shower.  You may shave your face/neck.  Please follow these instructions carefully:  1.  Shower with CHG Soap the night before surgery and the  morning of surgery.  2.  If you choose to wash your hair, wash your hair first as usual with your normal  shampoo.  3.  After you shampoo, rinse your hair and body thoroughly to remove the shampoo.  4.  Use CHG as you would any other liquid soap.  You can apply chg directly to the skin and wash.  Gently with a scrungie or clean washcloth.  5.  Apply the CHG Soap to your body ONLY FROM THE NECK DOWN.   Do   not use on face/ open                           Wound or open sores. Avoid contact with eyes, ears mouth and   genitals (private parts).                       Wash face,  Genitals (private parts) with your normal soap.             6.  Wash thoroughly, paying special attention to the area where your    surgery  will be performed.  7.  Thoroughly rinse your body with warm water from the neck down.  8.  DO NOT shower/wash with your normal soap after using and rinsing off the CHG Soap.                9.  Pat yourself dry with a clean towel.            10.  Wear clean pajamas.            11.  Place clean sheets on your bed the night of your first shower and do not  sleep with pets. Day of Surgery : Do not apply any lotions/deodorants the morning of surgery.  Please wear clean clothes to the hospital/surgery center.  FAILURE TO FOLLOW THESE INSTRUCTIONS MAY RESULT IN THE CANCELLATION OF YOUR SURGERY  PATIENT SIGNATURE_________________________________  NURSE  SIGNATURE__________________________________  ________________________________________________________________________

## 2018-10-16 ENCOUNTER — Encounter (HOSPITAL_COMMUNITY): Payer: Self-pay

## 2018-10-16 ENCOUNTER — Other Ambulatory Visit: Payer: Self-pay

## 2018-10-16 ENCOUNTER — Encounter (HOSPITAL_COMMUNITY)
Admission: RE | Admit: 2018-10-16 | Discharge: 2018-10-16 | Disposition: A | Discharge status: Home / Self Care | Payer: Medicare Other | Source: Ambulatory Visit | Attending: Urology | Admitting: Urology

## 2018-10-16 DIAGNOSIS — Z01812 Encounter for preprocedural laboratory examination: Secondary | ICD-10-CM | POA: Insufficient documentation

## 2018-10-16 DIAGNOSIS — R310 Gross hematuria: Secondary | ICD-10-CM | POA: Diagnosis not present

## 2018-10-16 DIAGNOSIS — E785 Hyperlipidemia, unspecified: Secondary | ICD-10-CM | POA: Insufficient documentation

## 2018-10-16 DIAGNOSIS — Z96652 Presence of left artificial knee joint: Secondary | ICD-10-CM | POA: Diagnosis not present

## 2018-10-16 DIAGNOSIS — Z6841 Body Mass Index (BMI) 40.0 and over, adult: Secondary | ICD-10-CM | POA: Diagnosis not present

## 2018-10-16 DIAGNOSIS — I10 Essential (primary) hypertension: Secondary | ICD-10-CM | POA: Insufficient documentation

## 2018-10-16 DIAGNOSIS — R319 Hematuria, unspecified: Secondary | ICD-10-CM | POA: Insufficient documentation

## 2018-10-16 DIAGNOSIS — E213 Hyperparathyroidism, unspecified: Secondary | ICD-10-CM | POA: Diagnosis not present

## 2018-10-16 DIAGNOSIS — F329 Major depressive disorder, single episode, unspecified: Secondary | ICD-10-CM | POA: Diagnosis not present

## 2018-10-16 DIAGNOSIS — Z79899 Other long term (current) drug therapy: Secondary | ICD-10-CM | POA: Insufficient documentation

## 2018-10-16 DIAGNOSIS — Z7901 Long term (current) use of anticoagulants: Secondary | ICD-10-CM | POA: Diagnosis not present

## 2018-10-16 DIAGNOSIS — F419 Anxiety disorder, unspecified: Secondary | ICD-10-CM | POA: Diagnosis not present

## 2018-10-16 DIAGNOSIS — I480 Paroxysmal atrial fibrillation: Secondary | ICD-10-CM | POA: Diagnosis not present

## 2018-10-16 DIAGNOSIS — I509 Heart failure, unspecified: Secondary | ICD-10-CM | POA: Diagnosis not present

## 2018-10-16 DIAGNOSIS — Z7982 Long term (current) use of aspirin: Secondary | ICD-10-CM | POA: Insufficient documentation

## 2018-10-16 DIAGNOSIS — M1990 Unspecified osteoarthritis, unspecified site: Secondary | ICD-10-CM | POA: Diagnosis not present

## 2018-10-16 DIAGNOSIS — I110 Hypertensive heart disease with heart failure: Secondary | ICD-10-CM | POA: Diagnosis not present

## 2018-10-16 DIAGNOSIS — Z8673 Personal history of transient ischemic attack (TIA), and cerebral infarction without residual deficits: Secondary | ICD-10-CM | POA: Insufficient documentation

## 2018-10-16 DIAGNOSIS — Z87891 Personal history of nicotine dependence: Secondary | ICD-10-CM | POA: Diagnosis not present

## 2018-10-16 DIAGNOSIS — N200 Calculus of kidney: Secondary | ICD-10-CM | POA: Insufficient documentation

## 2018-10-16 HISTORY — DX: Diverticulosis of intestine, part unspecified, without perforation or abscess without bleeding: K57.90

## 2018-10-16 HISTORY — DX: Paroxysmal atrial fibrillation: I48.0

## 2018-10-16 HISTORY — DX: Hematuria, unspecified: R31.9

## 2018-10-16 LAB — CBC
HCT: 44.1 % (ref 36.0–46.0)
Hemoglobin: 13.6 g/dL (ref 12.0–15.0)
MCH: 30.9 pg (ref 26.0–34.0)
MCHC: 30.8 g/dL (ref 30.0–36.0)
MCV: 100.2 fL — ABNORMAL HIGH (ref 80.0–100.0)
Platelets: 151 10*3/uL (ref 150–400)
RBC: 4.4 MIL/uL (ref 3.87–5.11)
RDW: 13.5 % (ref 11.5–15.5)
WBC: 5.4 10*3/uL (ref 4.0–10.5)
nRBC: 0 % (ref 0.0–0.2)

## 2018-10-16 LAB — BASIC METABOLIC PANEL
Anion gap: 7 (ref 5–15)
BUN: 17 mg/dL (ref 8–23)
CO2: 31 mmol/L (ref 22–32)
Calcium: 10.2 mg/dL (ref 8.9–10.3)
Chloride: 102 mmol/L (ref 98–111)
Creatinine, Ser: 0.73 mg/dL (ref 0.44–1.00)
GFR calc Af Amer: >60 mL/min (ref >60)
GFR calc non Af Amer: >60 mL/min (ref >60)
Glucose, Bld: 120 mg/dL — ABNORMAL HIGH (ref 70–99)
Potassium: 4.3 mmol/L (ref 3.5–5.1)
Sodium: 140 mmol/L (ref 135–145)

## 2018-10-16 LAB — HEMOGLOBIN A1C
Hgb A1c MFr Bld: 6.5 % — ABNORMAL HIGH (ref 4.8–5.6)
Mean Plasma Glucose: 139.85 mg/dL

## 2018-10-16 NOTE — Progress Notes (Signed)
SPOKE W/  _     SCREENING SYMPTOMS OF COVID 19:   COUGH-- no  RUNNY NOSE--- no  SORE THROAT--- no  NASAL CONGESTION---- no  SNEEZING---- no  SHORTNESS OF BREATH--- no  DIFFICULTY BREATHING--- no  TEMP >100.0 ----- no  UNEXPLAINED BODY ACHES------ no  CHILLS -------- no  HEADACHES --------- no  LOSS OF SMELL/ TASTE -------- no    HAVE YOU OR ANY FAMILY MEMBER TRAVELLED PAST 14 DAYS OUT OF THE  COUNTY--- no STATE---- no COUNTRY---- no  HAVE YOU OR ANY FAMILY MEMBER BEEN EXPOSED TO ANYONE WITH COVID 19? no

## 2018-10-16 NOTE — Progress Notes (Signed)
Virtual Visit via Telephone Note   This visit type was conducted due to national recommendations for restrictions regarding the COVID-19 Pandemic (e.g. social distancing) in an effort to limit this patient's exposure and mitigate transmission in our community.  Due to her co-morbid illnesses, this patient is at least at moderate risk for complications without adequate follow up.  This format is felt to be most appropriate for this patient at this time.  The patient did not have access to video technology/had technical difficulties with video requiring transitioning to audio format only (telephone).  All issues noted in this document were discussed and addressed.  No physical exam could be performed with this format.  Please refer to the patient's chart for her  consent to telehealth for [facility].   Date:  10/17/2018   ID:  Kim Dixon, DOB 1940-04-22, MRN [id number]  Patient Location: Home Provider Location: Home  PCP:  Asencion Noble, MD  Cardiologist:  Rozann Lesches, MD Electrophysiologist:  None   Evaluation Performed:  Follow-Up Visit  Chief Complaint:   Cardiac follow-up  History of Present Illness:    Kim Dixon is a 78 y.o. female last assessed via telehealth encounter in June.  She did not have video access and we spoke by phone today.  She does not report any palpitations at this time.  She tells me that she was ultimately diagnosed with nephrolithiasis, follows with Dr. Tresa Moore and plans to undergo cystoscopy ureteroscopy with laser and stent intervention tomorrow.   If things go smoothly and she does not require any further intervention, we discussed considering resumption of Eliquis in a few weeks, at which point she would stop aspirin.  The patient does not have symptoms concerning for COVID-19 infection (fever, chills, cough, or new shortness of breath).  Recent SARS coronavirus 2 test was negative.   Past Medical History:  Diagnosis Date  . Anxiety    . Arthritis   . Depression   . Diverticulosis   . Glaucoma   . Hematuria   . History of kidney stones   . History of pneumonia   . Hyperlipidemia   . Hypertension   . PAF (paroxysmal atrial fibrillation) ([place])   . Stress incontinence   . TIA (transient ischemic attack)    January 2020  . Urinary frequency   . Varicose veins    Past Surgical History:  Procedure Laterality Date  . ABDOMINAL HYSTERECTOMY    . ABLATION SAPHENOUS VEIN W/ RFA Bilateral   . COLONOSCOPY    . COLONOSCOPY N/A 06/18/2013   Procedure: COLONOSCOPY;  Surgeon: Rogene Houston, MD;  Location: [facility] ENDO SUITE;  Service: Endoscopy;  Laterality: N/A;  830  . EYE SURGERY     laser surgery bilat   . LITHOTRIPSY    . MASS EXCISION N/A 12/07/2017   Procedure: EXCISION 3CM CYST ON BACK;  Surgeon: Aviva Signs, MD;  Location: [facility] ORS;  Service: General;  Laterality: N/A;  . TONSILLECTOMY    . 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 Ascorbic Acid (VITAMIN C) 1000 MG tablet Take 3,000 mg by mouth daily.   Marland Kitchen aspirin EC 81 MG tablet Take 81 mg by mouth daily.  Marland Kitchen atorvastatin (LIPITOR) 10 MG tablet Take 10 mg by mouth every Monday, Wednesday, and Friday.   . bimatoprost (  LUMIGAN) 0.01 % SOLN Place 1 drop into both eyes at bedtime.  . citalopram (CELEXA) 20 MG tablet Take 20 mg by mouth every morning.   Marland Kitchen losartan (COZAAR) 100 MG tablet Take 100 mg by mouth daily.  . timolol (TIMOPTIC) 0.5 % ophthalmic solution Place 1 drop into both eyes daily.  Marland Kitchen torsemide (DEMADEX) 20 MG tablet Take 10 mg by mouth daily.      Allergies:   Naproxen sodium   Social History   Tobacco Use  . Smoking status: Former Smoker    Packs/day: 3.00    Years: 30.00    Pack years: 90.00    Types: Cigarettes    Quit date: 04/04/1993    Years since quitting: 25.5  . Smokeless  tobacco: Never Used  Substance Use Topics  . Alcohol use: Yes    Comment: Nightly glass of wine  . Drug use: No     Family Hx: The patient's family history includes Coronary artery disease in her mother; Lung cancer in her father. There is no history of Colon cancer or Stomach cancer.  ROS:   Please see the history of present illness.    Occasional mild "pink" urine. All other systems reviewed and are negative.   Prior CV studies:   The following studies were reviewed today:  Cardiac catheterization 01/01/2009: Normal left main, normal LAD, minimal luminal irregularities within the circumflex, normal RCA. LVEF 60% by ventriculography. Mean RV pressure 11 mmHg. Pulmonary artery systolic pressure 42 mmHg. Pulmonary capillary wedge pressure 20 mmHg. LVEDP 24 mmHg. Cardiac output and index of 6.2 and 2.7 respectively.  Brain MRI and MRA March 2020: IMPRESSION: 1. Mild chronic small vessel disease. No acute or chronic infarct. 2. Normal MRA of the neck.  Event recorder 08/22/2018: 14-day event recorder reviewed. Heart rate ranged from 46 bpm up to 109 bpm with average heart rate 63 bpm while in sinus rhythm. Atrial fibrillation was documented on May 5th, 6-hour episode that began at 5:03 AM. Episode of junctional rhythm with bradycardia noted at 1:34 PM on May 5th as well. There were no other sustained arrhythmias, brief bursts of SVT and a brief episode of NSVT. No pauses.  Echocardiogram 07/24/2018: 1. The left ventricle has normal systolic function with an ejection fraction of 60-65%. The cavity size was normal. Left ventricular diastolic parameters were normal. 2. The right ventricle has normal systolic function. The cavity was normal. There is no increase in right ventricular wall thickness. Right ventricular systolic pressure normal with an estimated pressure of 18.5 mmHg. 3. The aortic valve is tricuspid. Mild aortic annular calcification noted. 4. The mitral valve  is grossly normal. There is moderate to severe mitral annular calcification present. There is mild mitral regurgitation. 5. The tricuspid valve is grossly normal. 6. The aortic root is normal in size and structure.  Labs/Other Tests and Data Reviewed:    EKG:  An ECG dated 12/05/2017 was personally reviewed today and demonstrated:  Sinus rhythm with low voltage.  Recent Labs: 10/16/2018: BUN 17; Creatinine, Ser 0.73; Hemoglobin 13.6; Platelets 151; Potassium 4.3; Sodium 140    Wt Readings from Last 3 Encounters:  10/17/18 266 lb (120.7 kg)  10/16/18 266 lb 1.6 oz (120.7 kg)  07/05/18 255 lb (115.7 kg)     Objective:    Vital Signs:  Ht 5' 3.5" (1.613 m)   Wt 266 lb (120.7 kg)   BMI 46.38 kg/m    Patient spoke in full sentences, not short of breath. No  audible wheezing or coughing. Speech pattern normal.  ASSESSMENT & PLAN:    1.  Paroxysmal atrial fibrillation with CHADSVASC score of 6.  She has been off of Eliquis during evaluation for hematuria, now with evidence kidney stone and plan for intervention tomorrow by Dr. Tresa Moore.  If things go smoothly and she does not require any further intervention, anticipate possibly starting Eliquis back in about 2 weeks at which point she would stop aspirin.  If there are different recommendations by urology, this should be conveyed to our office.  2.  Essential hypertension, no changes made to baseline regimen.  Keep follow-up with Dr. Willey Blade.  3.  Mixed hyperlipidemia, she continues on Lipitor.  COVID-19 Education: The signs and symptoms of COVID-19 were discussed with the patient and how to seek care for testing (follow up with PCP or arrange E-visit).  The importance of social distancing was discussed today.  Time:   Today, I have spent 9 minutes with the patient with telehealth technology discussing the above problems.     Medication Adjustments/Labs and Tests Ordered: Current medicines are reviewed at length with the patient  today.  Concerns regarding medicines are outlined above.   Tests Ordered: No orders of the defined types were placed in this encounter.   Medication Changes: No orders of the defined types were placed in this encounter.   Follow Up:  In Person 6 weeks in the [place] office.  Signed, Rozann Lesches, MD  10/17/2018 9:21 AM    [place]

## 2018-10-17 ENCOUNTER — Encounter: Payer: Self-pay | Admitting: Cardiology

## 2018-10-17 ENCOUNTER — Telehealth (HOSPITAL_COMMUNITY): Payer: Self-pay | Admitting: *Deleted

## 2018-10-17 ENCOUNTER — Telehealth (INDEPENDENT_AMBULATORY_CARE_PROVIDER_SITE_OTHER): Payer: Medicare Other | Admitting: Cardiology

## 2018-10-17 VITALS — Ht 63.5 in | Wt 266.0 lb

## 2018-10-17 DIAGNOSIS — I10 Essential (primary) hypertension: Secondary | ICD-10-CM | POA: Diagnosis not present

## 2018-10-17 DIAGNOSIS — N200 Calculus of kidney: Secondary | ICD-10-CM | POA: Diagnosis not present

## 2018-10-17 DIAGNOSIS — I480 Paroxysmal atrial fibrillation: Secondary | ICD-10-CM

## 2018-10-17 DIAGNOSIS — E782 Mixed hyperlipidemia: Secondary | ICD-10-CM

## 2018-10-17 DIAGNOSIS — R310 Gross hematuria: Secondary | ICD-10-CM

## 2018-10-17 MED ORDER — GENTAMICIN SULFATE 40 MG/ML IJ SOLN
5.0000 mg/kg | INTRAVENOUS | Status: AC
  Administered 2018-10-18: 400 mg via INTRAVENOUS
  Filled 2018-10-17 (×2): qty 10

## 2018-10-17 NOTE — Anesthesia Preprocedure Evaluation (Addendum)
Anesthesia Evaluation  Patient identified by MRN, date of birth, ID band Patient awake    Reviewed: Allergy & Precautions, NPO status , Patient's Chart, lab work & pertinent test results  History of Anesthesia Complications Negative for: history of anesthetic complications  Airway Mallampati: II  TM Distance: >3 FB Neck ROM: Full    Dental  (+) Teeth Intact   Pulmonary neg pulmonary ROS, former smoker,    Pulmonary exam normal        Cardiovascular hypertension, Pt. on medications Normal cardiovascular exam+ dysrhythmias Atrial Fibrillation      Neuro/Psych PSYCHIATRIC DISORDERS Anxiety Depression TIA (Jan 2020)   GI/Hepatic negative GI ROS, Neg liver ROS,   Endo/Other  Morbid obesity  Renal/GU negative Renal ROS  negative genitourinary   Musculoskeletal negative musculoskeletal ROS (+)   Abdominal (+) + obese,   Peds  Hematology negative hematology ROS (+)   Anesthesia Other Findings Echo 07/24/18: EF 60-65%, mild MR  Reproductive/Obstetrics                           Anesthesia Physical Anesthesia Plan  ASA: III  Anesthesia Plan: General   Post-op Pain Management:    Induction: Intravenous  PONV Risk Score and Plan: 3 and Ondansetron, Dexamethasone and Treatment may vary due to age or medical condition  Airway Management Planned: LMA  Additional Equipment: None  Intra-op Plan:   Post-operative Plan: Extubation in OR  Informed Consent: I have reviewed the patients History and Physical, chart, labs and discussed the procedure including the risks, benefits and alternatives for the proposed anesthesia with the patient or authorized representative who has indicated his/her understanding and acceptance.     Dental advisory given  Plan Discussed with:   Anesthesia Plan Comments: (See PAT note 10/16/2018, Konrad Felix, PA-C)      Anesthesia Quick Evaluation

## 2018-10-17 NOTE — Patient Instructions (Signed)
Your physician recommends that you schedule a follow-up appointment in: [place] [address] [place]  Your physician has recommended you make the following change in your medication:   RESUME ELIQUIS IN 2 WEEKS AFTER PROCEDURE AND STOP ASPIRIN - IF PROVIDER PREFORMING YOUR PROCEDURE SUGGEST ANYTHING DIFFERENT PLEASE CALL OUR OFFICE   Thank you for choosing [facility]!!

## 2018-10-17 NOTE — Progress Notes (Signed)
Anesthesia Chart Review   Case: [id number] Date/Time: 10/18/18 0915   Procedures:      CYSTOSCOPY WITH RETROGRADE PYELOGRAM, URETEROSCOPY AND STENT PLACEMENT (Left ) - 1 HR     HOLMIUM LASER APPLICATION (Left )   Anesthesia type: General   Pre-op diagnosis: LEFT RENAL STONE   Location: [place] / [facility] ORS   Surgeon: Kim Frock, MD      DISCUSSION:77 y.o. former smoker (90 pack years, quit 04/03/93) with h/o depression, anxiety, HTN, TIA 03/2018, PAF (currently holding Eliquis due to hematuria), left renal stone scheduled for above procedure 10/18/2018 with Dr. Alexis Dixon.   Pt seen by cardiology 10/17/2018 via telemedicine.  Seen by Dr. Rozann Dixon, aware of upcoming procedure.  Per OV note, "Paroxysmal atrial fibrillation with CHADSVASC score of 6.  She has been off of Eliquis during evaluation for hematuria, now with evidence kidney stone and Dixon for intervention tomorrow by Dr. Tresa Dixon.  If things go smoothly and she does not require any further intervention, anticipate possibly starting Eliquis back in about 2 weeks at which point she would stop aspirin.  If there are different recommendations by urology, this should be conveyed to our office."  Anticipate pt can proceed with planned procedure barring acute status change.   VS: BP (!) 142/71   Pulse 63   Temp 36.9 C (Oral)   Resp 16   Ht 5\' 3"  (1.6 m)   Wt 120.7 kg   SpO2 99%   BMI 47.14 kg/m   PROVIDERS: Kim Noble, MD is PCP   Kim Lesches, MD is Cardiologist  LABS: Labs reviewed: Acceptable for surgery. (all labs ordered are listed, but only abnormal results are displayed)  Labs Reviewed  HEMOGLOBIN A1C - Abnormal; Notable for the following components:      Result Value   Hgb A1c MFr Bld 6.5 (*)    All other components within normal limits  CBC - Abnormal; Notable for the following components:   MCV 100.2 (*)    All other components within normal limits  BASIC METABOLIC PANEL - Abnormal; Notable for  the following components:   Glucose, Bld 120 (*)    All other components within normal limits     IMAGES: CT Abdomen Pelvis 09/06/2018 IMPRESSION: 1. 8 mm lower pole left renal calculus may account for the patient's hematuria. No obstructing ureteral calculi or bladder calculi. 2. No worrisome renal or bladder lesions. 3. No acute abdominal/pelvic findings, mass lesions or adenopathy. 4. Advanced colonic diverticulosis without findings for acute diverticulitis.   EKG: 12/05/17 Rate 60 bpm Normal sinus rhythm  Low voltage QRS Cannot rule out Anterior infarct, age undetermined Abnormal ECG  CV: Event recorder6/06/2018: 14-day event recorder reviewed. Heart rate ranged from 46 bpm up to 109 bpm with average heart rate 63 bpm while in sinus rhythm. Atrial fibrillation was documented on May 5th, 6-hour episode that began at 5:03 AM. Episode of junctional rhythm with bradycardia noted at 1:34 PM on May 5th as well. There were no other sustained arrhythmias, brief bursts of SVT and a brief episode of NSVT. No pauses.  Echo 07/24/2018 IMPRESSIONS    1. The left ventricle has normal systolic function with an ejection fraction of 60-65%. The cavity size was normal. Left ventricular diastolic parameters were normal.  2. The right ventricle has normal systolic function. The cavity was normal. There is no increase in right ventricular wall thickness. Right ventricular systolic pressure normal with an estimated pressure of 18.5 mmHg.  3. The aortic valve is tricuspid. Mild aortic annular calcification noted.  4. The mitral valve is grossly normal. There is moderate to severe mitral annular calcification present. There is mild mitral regurgitation.  5. The tricuspid valve is grossly normal.  6. The aortic root is normal in size and structure.  Cardiac catheterization 01/01/2009: Normal left main, normal LAD, minimal luminal irregularities within the circumflex, normal RCA. LVEF 60% by  ventriculography. Mean RV pressure 11 mmHg. Pulmonary artery systolic pressure 42 mmHg. Pulmonary capillary wedge pressure 20 mmHg. LVEDP 24 mmHg. Cardiac output and index of 6.2 and 2.7 respectively. Past Medical History:  Diagnosis Date  . Anxiety   . Arthritis   . Depression   . Diverticulosis   . Glaucoma   . Hematuria   . History of kidney stones   . History of pneumonia   . Hyperlipidemia   . Hypertension   . PAF (paroxysmal atrial fibrillation) ([place])   . Stress incontinence   . TIA (transient ischemic attack)    January 2020  . Urinary frequency   . Varicose veins     Past Surgical History:  Procedure Laterality Date  . ABDOMINAL HYSTERECTOMY    . ABLATION SAPHENOUS VEIN W/ RFA Bilateral   . COLONOSCOPY    . COLONOSCOPY N/A 06/18/2013   Procedure: COLONOSCOPY;  Surgeon: Kim Houston, MD;  [facility];  Service: Endoscopy;  Laterality: N/A;  830  . EYE SURGERY     laser surgery bilat   . LITHOTRIPSY    . MASS EXCISION N/A 12/07/2017   Procedure: EXCISION 3CM CYST ON BACK;  Surgeon: Kim Signs, MD;  Location: [facility] ORS;  Service: General;  Laterality: N/A;  . TONSILLECTOMY    . TOTAL KNEE ARTHROPLASTY Left 08/25/2014   Procedure: LEFT TOTAL KNEE ARTHROPLASTY;  Surgeon: Kim Cancel, MD;  Location: [facility] ORS;  Service: Orthopedics;  Laterality: Left;    MEDICATIONS: . Ascorbic Acid (VITAMIN C) 1000 MG tablet  . aspirin EC 81 MG tablet  . atorvastatin (LIPITOR) 10 MG tablet  . bimatoprost (LUMIGAN) 0.01 % SOLN  . citalopram (CELEXA) 20 MG tablet  . losartan (COZAAR) 100 MG tablet  . timolol (TIMOPTIC) 0.5 % ophthalmic solution  . torsemide (DEMADEX) 20 MG tablet   No current facility-administered medications for this encounter.    [name] ON 10/18/2018] gentamicin (GARAMYCIN) 400 mg in dextrose 5 % 100 mL IVPB    Kim Dixon [facility] Pre-Surgical Testing [phone] 10/17/18  10:26 AM

## 2018-10-18 ENCOUNTER — Ambulatory Visit (HOSPITAL_COMMUNITY): Payer: Medicare Other | Admitting: Anesthesiology

## 2018-10-18 ENCOUNTER — Encounter (HOSPITAL_COMMUNITY): Payer: Self-pay

## 2018-10-18 ENCOUNTER — Ambulatory Visit (HOSPITAL_COMMUNITY)
Admission: RE | Admit: 2018-10-18 | Discharge: 2018-10-18 | Disposition: A | Discharge status: Home / Self Care | Payer: Medicare Other | Attending: Urology | Admitting: Urology

## 2018-10-18 ENCOUNTER — Encounter (HOSPITAL_COMMUNITY)
Admission: RE | Disposition: A | Discharge status: Home / Self Care | Payer: Self-pay | Source: Home / Self Care | Attending: Urology

## 2018-10-18 ENCOUNTER — Ambulatory Visit (HOSPITAL_COMMUNITY): Payer: Medicare Other | Admitting: Physician Assistant

## 2018-10-18 ENCOUNTER — Ambulatory Visit (HOSPITAL_COMMUNITY): Payer: Medicare Other

## 2018-10-18 DIAGNOSIS — I110 Hypertensive heart disease with heart failure: Secondary | ICD-10-CM | POA: Insufficient documentation

## 2018-10-18 DIAGNOSIS — Z7901 Long term (current) use of anticoagulants: Secondary | ICD-10-CM | POA: Insufficient documentation

## 2018-10-18 DIAGNOSIS — E785 Hyperlipidemia, unspecified: Secondary | ICD-10-CM | POA: Insufficient documentation

## 2018-10-18 DIAGNOSIS — I509 Heart failure, unspecified: Secondary | ICD-10-CM | POA: Insufficient documentation

## 2018-10-18 DIAGNOSIS — F419 Anxiety disorder, unspecified: Secondary | ICD-10-CM | POA: Insufficient documentation

## 2018-10-18 DIAGNOSIS — F329 Major depressive disorder, single episode, unspecified: Secondary | ICD-10-CM | POA: Insufficient documentation

## 2018-10-18 DIAGNOSIS — Z6841 Body Mass Index (BMI) 40.0 and over, adult: Secondary | ICD-10-CM | POA: Insufficient documentation

## 2018-10-18 DIAGNOSIS — I480 Paroxysmal atrial fibrillation: Secondary | ICD-10-CM | POA: Insufficient documentation

## 2018-10-18 DIAGNOSIS — R310 Gross hematuria: Secondary | ICD-10-CM | POA: Diagnosis not present

## 2018-10-18 DIAGNOSIS — N200 Calculus of kidney: Secondary | ICD-10-CM | POA: Diagnosis not present

## 2018-10-18 DIAGNOSIS — Z8673 Personal history of transient ischemic attack (TIA), and cerebral infarction without residual deficits: Secondary | ICD-10-CM | POA: Insufficient documentation

## 2018-10-18 DIAGNOSIS — M1990 Unspecified osteoarthritis, unspecified site: Secondary | ICD-10-CM | POA: Insufficient documentation

## 2018-10-18 DIAGNOSIS — Z87891 Personal history of nicotine dependence: Secondary | ICD-10-CM | POA: Insufficient documentation

## 2018-10-18 DIAGNOSIS — Z96652 Presence of left artificial knee joint: Secondary | ICD-10-CM | POA: Insufficient documentation

## 2018-10-18 DIAGNOSIS — E213 Hyperparathyroidism, unspecified: Secondary | ICD-10-CM | POA: Diagnosis not present

## 2018-10-18 HISTORY — PX: CYSTOSCOPY WITH RETROGRADE PYELOGRAM, URETEROSCOPY AND STENT PLACEMENT: SHX5789

## 2018-10-18 HISTORY — PX: HOLMIUM LASER APPLICATION: SHX5852

## 2018-10-18 LAB — GLUCOSE, CAPILLARY: Glucose-Capillary: 156 mg/dL — ABNORMAL HIGH (ref 70–99)

## 2018-10-18 SURGERY — CYSTOURETEROSCOPY, WITH RETROGRADE PYELOGRAM AND STENT INSERTION
Anesthesia: General | Laterality: Left

## 2018-10-18 MED ORDER — PROPOFOL 10 MG/ML IV BOLUS
INTRAVENOUS | Status: DC | PRN
  Administered 2018-10-18: 130 mg via INTRAVENOUS

## 2018-10-18 MED ORDER — ONDANSETRON HCL 4 MG/2ML IJ SOLN
INTRAMUSCULAR | Status: DC | PRN
  Administered 2018-10-18: 4 mg via INTRAVENOUS

## 2018-10-18 MED ORDER — LIDOCAINE 2% (20 MG/ML) 5 ML SYRINGE
INTRAMUSCULAR | Status: AC
  Filled 2018-10-18: qty 5

## 2018-10-18 MED ORDER — DEXAMETHASONE SODIUM PHOSPHATE 10 MG/ML IJ SOLN
INTRAMUSCULAR | Status: DC | PRN
  Administered 2018-10-18: 10 mg via INTRAVENOUS

## 2018-10-18 MED ORDER — SODIUM CHLORIDE 0.9 % IR SOLN
Status: DC | PRN
  Administered 2018-10-18: 3000 mL via INTRAVESICAL

## 2018-10-18 MED ORDER — CEPHALEXIN 500 MG PO CAPS: 500.0000 mg | ORAL_CAPSULE | Freq: Two times a day (BID) | ORAL | 0 refills | Status: DC

## 2018-10-18 MED ORDER — 0.9 % SODIUM CHLORIDE (POUR BTL) OPTIME
TOPICAL | Status: DC | PRN
  Administered 2018-10-18: 1000 mL

## 2018-10-18 MED ORDER — FENTANYL CITRATE (PF) 100 MCG/2ML IJ SOLN
INTRAMUSCULAR | Status: AC
  Filled 2018-10-18: qty 2

## 2018-10-18 MED ORDER — LACTATED RINGERS IV SOLN
INTRAVENOUS | Status: DC
  Administered 2018-10-18: 08:00:00 via INTRAVENOUS

## 2018-10-18 MED ORDER — PROPOFOL 10 MG/ML IV BOLUS
INTRAVENOUS | Status: AC
  Filled 2018-10-18: qty 20

## 2018-10-18 MED ORDER — DEXAMETHASONE SODIUM PHOSPHATE 10 MG/ML IJ SOLN
INTRAMUSCULAR | Status: AC
  Filled 2018-10-18: qty 1

## 2018-10-18 MED ORDER — SENNOSIDES-DOCUSATE SODIUM 8.6-50 MG PO TABS: 1.0000 | ORAL_TABLET | Freq: Two times a day (BID) | ORAL | 0 refills | Status: DC

## 2018-10-18 MED ORDER — FENTANYL CITRATE (PF) 100 MCG/2ML IJ SOLN
INTRAMUSCULAR | Status: DC | PRN
  Administered 2018-10-18 (×2): 25 ug via INTRAVENOUS

## 2018-10-18 MED ORDER — ONDANSETRON HCL 4 MG/2ML IJ SOLN
INTRAMUSCULAR | Status: AC
  Filled 2018-10-18: qty 2

## 2018-10-18 MED ORDER — OXYCODONE-ACETAMINOPHEN 5-325 MG PO TABS: 1.0000 | ORAL_TABLET | Freq: Four times a day (QID) | ORAL | 0 refills | Status: DC | PRN

## 2018-10-18 MED ORDER — IOHEXOL 300 MG/ML  SOLN
INTRAMUSCULAR | Status: DC | PRN
  Administered 2018-10-18: 20 mL via URETHRAL

## 2018-10-18 MED ORDER — LIDOCAINE 2% (20 MG/ML) 5 ML SYRINGE
INTRAMUSCULAR | Status: DC | PRN
  Administered 2018-10-18: 100 mg via INTRAVENOUS

## 2018-10-18 SURGICAL SUPPLY — 25 items
BAG URO CATCHER STRL LF (MISCELLANEOUS) ×3 IMPLANT
BASKET LASER NITINOL 1.9FR (BASKET) ×3 IMPLANT
BSKT STON RTRVL 120 1.9FR (BASKET) ×1
CATH INTERMIT  6FR 70CM (CATHETERS) ×3 IMPLANT
CLOTH BEACON ORANGE TIMEOUT ST (SAFETY) IMPLANT
COVER SURGICAL LIGHT HANDLE (MISCELLANEOUS) IMPLANT
COVER WAND RF STERILE (DRAPES) IMPLANT
EXTRACTOR STONE 1.7FRX115CM (UROLOGICAL SUPPLIES) IMPLANT
FIBER LASER FLEXIVA 1000 (UROLOGICAL SUPPLIES) IMPLANT
FIBER LASER FLEXIVA 365 (UROLOGICAL SUPPLIES) IMPLANT
FIBER LASER FLEXIVA 550 (UROLOGICAL SUPPLIES) IMPLANT
FIBER LASER TRAC TIP (UROLOGICAL SUPPLIES) ×3 IMPLANT
GLOVE BIOGEL M STRL SZ7.5 (GLOVE) ×3 IMPLANT
GOWN STRL REUS W/TWL LRG LVL3 (GOWN DISPOSABLE) ×3 IMPLANT
GUIDEWIRE ANG ZIPWIRE 038X150 (WIRE) ×3 IMPLANT
GUIDEWIRE STR DUAL SENSOR (WIRE) ×3 IMPLANT
KIT TURNOVER KIT A (KITS) ×3 IMPLANT
MANIFOLD NEPTUNE II (INSTRUMENTS) ×3 IMPLANT
PACK CYSTO (CUSTOM PROCEDURE TRAY) ×3 IMPLANT
SHEATH URETERAL 12FRX28CM (UROLOGICAL SUPPLIES) ×3 IMPLANT
SHEATH URETERAL 12FRX35CM (MISCELLANEOUS) IMPLANT
STENT POLARIS 5FRX24 (STENTS) ×3 IMPLANT
TUBE FEEDING 8FR 16IN STR KANG (MISCELLANEOUS) ×3 IMPLANT
TUBING CONNECTING 10 (TUBING) ×2 IMPLANT
TUBING CONNECTING 10' (TUBING) ×1

## 2018-10-18 NOTE — Brief Op Note (Signed)
10/18/2018  9:56 AM  PATIENT:  Kim Dixon  78 y.o. female  PRE-OPERATIVE DIAGNOSIS:  LEFT RENAL STONE  POST-OPERATIVE DIAGNOSIS:  LEFT RENAL STONE  PROCEDURE:  Procedure(s) with comments: CYSTOSCOPY WITH RETROGRADE PYELOGRAM, URETEROSCOPY AND STENT PLACEMENT (Left) - 1 HR HOLMIUM LASER APPLICATION (Left)  SURGEON:  Surgeon(s) and Role:    Alexis Frock, MD - Primary  PHYSICIAN ASSISTANT:   ASSISTANTS: none   ANESTHESIA:   general  EBL:  minimal   BLOOD ADMINISTERED:none  DRAINS: none   LOCAL MEDICATIONS USED:  NONE  SPECIMEN:  Source of Specimen:  left renal stone fragments  DISPOSITION OF SPECIMEN:  [facility]  COUNTS:  YES  TOURNIQUET:  * No tourniquets in log *  DICTATION: .Other Dictation: Dictation Number [id number]  PLAN OF CARE: Discharge to home after PACU  PATIENT DISPOSITION:  PACU - hemodynamically stable.   Delay start of Pharmacological VTE agent (>24hrs) due to surgical blood loss or risk of bleeding: yes

## 2018-10-18 NOTE — Transfer of Care (Signed)
Immediate Anesthesia Transfer of Care Note  Patient: Kim Dixon  Procedure(s) Performed: CYSTOSCOPY WITH RETROGRADE PYELOGRAM, URETEROSCOPY AND STENT PLACEMENT (Left ) HOLMIUM LASER APPLICATION (Left )  Patient Location: PACU  Anesthesia Type:General  Level of Consciousness: awake, alert  and oriented  Airway & Oxygen Therapy: Patient Spontanous Breathing and Patient connected to face mask oxygen  Post-op Assessment: Report given to RN and Post -op Vital signs reviewed and stable  Post vital signs: Reviewed and stable  Last Vitals:  Vitals Value Taken Time  BP 161/141 10/18/18 1006  Temp    Pulse 61 10/18/18 1007  Resp 14 10/18/18 1007  SpO2 100 % 10/18/18 1007  Vitals shown include unvalidated device data.  Last Pain:  Vitals:   10/18/18 0752  TempSrc: Oral  PainSc:          Complications: No apparent anesthesia complications

## 2018-10-18 NOTE — Anesthesia Procedure Notes (Signed)
Procedure Name: LMA Insertion Date/Time: 10/18/2018 9:23 AM Performed by: Sharlette Dense, CRNA Patient Re-evaluated:Patient Re-evaluated prior to induction Oxygen Delivery Method: Circle system utilized Preoxygenation: Pre-oxygenation with 100% oxygen Induction Type: IV induction LMA: LMA inserted LMA Size: 4.0 Number of attempts: 1 Placement Confirmation: positive ETCO2 and breath sounds checked- equal and bilateral Tube secured with: Tape Dental Injury: Teeth and Oropharynx as per pre-operative assessment

## 2018-10-18 NOTE — H&P (Signed)
Kim Dixon is an 78 y.o. female.    Chief Complaint: Pre-Op Cysto, bilateral retrogrades, LEFT ureteroscopic stone manipulation  HPI:   1 - Recurrent Nephrolithiasis -  2016 - Left SWL for 74mm UPJ stone to stone free  09/2018 - Left lower pole 67mm stone on hematuria CT   2 - Medical Stone Disease / Borderline Hyperparathyroid -  Eval 2020: BMP, PTH, Urate- mild high PTH 84; Composition - 100 % CaOx; 24 Hr Urines - pending.   3 - Gross Hematuria - new gross hematuria 08/2018 after starting eliquus for TIA prevention. Remote >20PY smoker. Hematuria CT with non-obstructing left lower pole stone. No cysto yet.   PMH sig for CHF/Lasix (not limiting, follows Kim Dixon cards), AFib/TIA/Eliquus, Obesity, MSK pain / Mobic, benign hyst. No ischemic heart disease. Her PCP is Kim Noble MD.    Today " Kim Dixon " is seen to proceed with cysto, bilateral retrogrades and left ureteroscopy to complete hematuria eval and achieve stone free. NO interval fevers. Most recent UCX negative.    Past Medical History:  Diagnosis Date  . Anxiety   . Arthritis   . Depression   . Diverticulosis   . Glaucoma   . Hematuria   . History of kidney stones   . History of pneumonia   . Hyperlipidemia   . Hypertension   . PAF (paroxysmal atrial fibrillation) ([place])   . Stress incontinence   . TIA (transient ischemic attack)    January 2020  . Urinary frequency   . Varicose veins     Past Surgical History:  Procedure Laterality Date  . ABDOMINAL HYSTERECTOMY    . ABLATION SAPHENOUS VEIN W/ RFA Bilateral   . COLONOSCOPY    . COLONOSCOPY N/A 06/18/2013   Procedure: COLONOSCOPY;  Surgeon: Rogene Houston, MD;  [facility];  Service: Endoscopy;  Laterality: N/A;  830  . EYE SURGERY     laser surgery bilat   . LITHOTRIPSY    . MASS EXCISION N/A 12/07/2017   Procedure: EXCISION 3CM CYST ON BACK;  Surgeon: Aviva Signs, MD;  Location: [facility];  Service: General;  Laterality: N/A;  . TONSILLECTOMY     . 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 Coronary artery disease Mother   . Lung cancer Father   . Colon cancer Neg Hx   . Stomach cancer Neg Hx    Social History:  reports that she quit smoking about 25 years ago. Her smoking use included cigarettes. She has a 90.00 pack-year smoking history. She has never used smokeless tobacco. She reports current alcohol use. She reports that she does not use drugs.  Allergies:  Allergies  Allergen Reactions  . Naproxen Sodium     Tightness in throat    No medications prior to admission.    Results for orders placed or performed during the hospital encounter of 10/16/18 (from the past 48 hour(s))  Hemoglobin A1c     Status: Abnormal   Collection Time: 10/16/18  3:10 PM  Result Value Ref Range   Hgb A1c MFr Bld 6.5 (H) 4.8 - 5.6 %    Comment: (NOTE) Pre diabetes:          5.7%-6.4% Diabetes:              >6.4%  Glycemic control for   <7.0% adults with diabetes    Mean Plasma Glucose 139.85 mg/dL    Comment: Performed at [facility] [address], [place], [place] [address]  CBC     Status: Abnormal   Collection Time: 10/16/18  3:10 PM  Result Value Ref Range   WBC 5.4 4.0 - 10.5 K/uL   RBC 4.40 3.87 - 5.11 MIL/uL   Hemoglobin 13.6 12.0 - 15.0 g/dL   HCT 44.1 36.0 - 46.0 %   MCV 100.2 (H) 80.0 - 100.0 fL   MCH 30.9 26.0 - 34.0 pg   MCHC 30.8 30.0 - 36.0 g/dL   RDW 13.5 11.5 - 15.5 %   Platelets 151 150 - 400 K/uL   nRBC 0.0 0.0 - 0.2 %    Comment: Performed at [facility], [place] [address], [place], [place] [address]  Basic metabolic panel     Status: Abnormal   Collection Time: 10/16/18  3:10 PM  Result Value Ref Range   Sodium 140 135 - 145 mmol/L   Potassium 4.3 3.5 - 5.1 mmol/L   Chloride 102 98 - 111 mmol/L   CO2  31 22 - 32 mmol/L   Glucose, Bld 120 (H) 70 - 99 mg/dL   BUN 17 8 - 23 mg/dL   Creatinine, Ser 0.73 0.44 - 1.00 mg/dL   Calcium 10.2 8.9 - 10.3 mg/dL   GFR calc non Af Amer >60 >60 mL/min   GFR calc Af Amer >60 >60 mL/min   Anion gap 7 5 - 15    Comment: Performed at [facility], [place] [address] [place], [place] [address]   No results found.  Review of Systems  Constitutional: Negative for chills and fever.  Genitourinary: Positive for hematuria.  All other systems reviewed and are negative.   There were no vitals taken for this visit. Physical Exam  Constitutional: She appears well-developed.  HENT:  Head: Normocephalic.  Eyes: Pupils are equal, round, and reactive to light.  Neck: Normal range of motion.  Cardiovascular: Normal rate.  Respiratory: Effort normal.  GI: Soft.  Stable mild truncal obesity.   Genitourinary:    Genitourinary Comments: No CVAT at present.    Musculoskeletal: Normal range of motion.  Neurological: She is alert.  Skin: Skin is warm.  Psychiatric: She has a normal mood and affect.     Assessment/Plan  Proceed as planned with LEFT ureteroscopy and bilateral retrogrades. Risks, benefits, alternatives, expected per-op course discussed previously and reiterated today.   Alexis Frock, MD 10/18/2018, 7:17 AM

## 2018-10-18 NOTE — Anesthesia Postprocedure Evaluation (Signed)
Anesthesia Post Note  Patient: Kim Dixon  Procedure(s) Performed: CYSTOSCOPY WITH RETROGRADE PYELOGRAM, URETEROSCOPY AND STENT PLACEMENT (Left ) HOLMIUM LASER APPLICATION (Left )     Patient location during evaluation: PACU Anesthesia Type: General Level of consciousness: awake and alert Pain management: pain level controlled Vital Signs Assessment: post-procedure vital signs reviewed and stable Respiratory status: spontaneous breathing, nonlabored ventilation and respiratory function stable Cardiovascular status: blood pressure returned to baseline and stable Postop Assessment: no apparent nausea or vomiting Anesthetic complications: no    Last Vitals:  Vitals:   10/18/18 1030 10/18/18 1048  BP: (!) 159/83 (!) 156/80  Pulse: 60 (!) 58  Resp: 14 14  Temp: 36.5 C 36.5 C  SpO2: 93% 95%    Last Pain:  Vitals:   10/18/18 1048  TempSrc:   PainSc: 0-No pain                 Lidia Collum

## 2018-10-18 NOTE — Discharge Instructions (Signed)
1 - You may have urinary urgency (bladder spasms) and bloody urine on / off with stent in place. This is normal. ° °2 - Remove tethered stent on Monday morning at home by pulling on string, then blue-white plastic tubing, and discarding. Office is open Monday if any problems arise.  ° °3 - Call MD or go to ER for fever >102, severe pain / nausea / vomiting not relieved by medications, or acute change in medical status ° °

## 2018-10-18 NOTE — Op Note (Signed)
NAME: Kim, Dixon MEDICAL RECORD UD:[id number] ACCOUNT [id number] DATE OF BIRTH:Jul 01, 1940 F[facility] LOCATION: [facility] PHYSICIAN:Estee Yohe, MD  OPERATIVE REPORT  DATE OF PROCEDURE:  10/18/2018  PREOPERATIVE DIAGNOSIS:  Left renal stones, gross hematuria.  PROCEDURE: 1.  Cystoscopy, left retrograde pyelogram, interpretation. 2.  Left ureteroscopy with laser lithotripsy. 3.  Insertion of left ureteral stent 5 x 24 Polaris with tether.  ESTIMATED BLOOD LOSS:  Nil.  COMPLICATIONS:  None.  SPECIMENS:  Left renal stone fragments for analysis.  FINDINGS: 1.  Likely ball-valving left renal pelvis stone. 2.  Complete resolution of all accessible stone fragments larger than 130 mm following laser lithotripsy, basket extraction on the left. 3.  Successful placement of left ureteral stent, proximal end renal pelvis, distal end urinary bladder.  INDICATIONS:  This is a very pleasant 78 year old lady with history of prior urolithiasis.  She was found on workup of gross hematuria to have a left renal stone.  This was not obviously obstructing at the time of imaging, but she did note recurrent  intermittent gross hematuria.  This is likely from the stone.  Options were discussed for management including observation versus shockwave lithotripsy versus ureteroscopy with goal of completing hematuria evaluation and rendering her stone free, and she  wished to proceed with the latter.  Informed consent was obtained and placed in the medical record.  PROCEDURE IN DETAIL:  The patient being identified, procedure being left ureteroscopic stimulation was confirmed.  Procedure time-out was performed.  IV antibiotics administered.  General LMA anesthesia induced.  The patient was placed into a low  lithotomy position.  A sterile field was created, prepping and draping the vagina, introitus, and proximal thighs using iodine.  Cystourethroscopy was performed using a 21-French rigid  cystoscope with offset lens.  Inspection of urinary bladder revealed  no diverticula, calcifications, papillary lesions.  The left ureteral orifice was cannulated with a sequential catheter and left retrograde pyelogram was obtained.  Left retrograde pyelogram shows a single left ureter with single-system left kidney.  There was a filling defect at the level of the UPJ that was mobile, consistent likely with ball-valving of left renal pelvis stone.  A 0.3 ZIPwire was advanced to the  lower pole and set aside as a safety wire.  An 8-French feeding tube was placed in the urinary bladder for pressure release and semirigid ureteroscopy of the distal 4/5 left ureter alongside a separate sensor working wire.  No mucosal abnormalities were  found.  The semirigid scope was exchanged for a 12/15 short-length ureteral access sheath to the level of the proximal ureter using continuous fluoroscopic guidance.  Flexible digital ureteroscopy was then performed of the proximal left ureter and  systematic inspection of the left kidney.  There was a renal pelvis stone that again appeared to be free floating.  It was intermittent ball-valving as there was some mucosal edema at the left UPJ area without obvious papillary changes.  The stent was  grasped and escape basket repositioned into an upper pole calix to allow for favorable angulation.  Then holmium laser energy applied at a setting of 0.2 joules and 20 Hz. It was fragmented into approximately 4 smaller pieces.  These were then  sequentially grasped with the long axis, removed, and set aside for composition analysis.  Following this, there was complete resolution of all accessible stone fragments larger than 130 mm.  No evidence of renal perforation.  The access sheath was  removed under continuous vision.  No mucosal abnormalities were  found.  Given the likely intermittent ball-valving with some mucosal edema at the UPJ, it was felt that brief interval stenting with a  tethered stent would be warranted.  As such, a new 5 x  24 Polaris-type stent was placed with remaining safety wire using fluoroscopic guidance.  Good proximal and distal planes were noted.  The tether was left in place and trimmed to approximately 4 inches in length, and the procedure was terminated.  The  patient tolerated the procedure well.  No immediate complications.  The patient was taken to postanesthesia care in stable condition and plan for discharge home.  LN/NUANCE  D:10/18/2018 T:10/18/2018 JOB:[id number]

## 2018-10-19 ENCOUNTER — Encounter (HOSPITAL_COMMUNITY): Payer: Self-pay | Admitting: Urology

## 2018-12-04 NOTE — Telephone Encounter (Signed)
Error

## 2018-12-05 ENCOUNTER — Other Ambulatory Visit: Payer: Self-pay

## 2018-12-05 ENCOUNTER — Encounter: Payer: Self-pay | Admitting: Cardiology

## 2018-12-05 ENCOUNTER — Ambulatory Visit (INDEPENDENT_AMBULATORY_CARE_PROVIDER_SITE_OTHER): Payer: Medicare Other | Admitting: Cardiology

## 2018-12-05 VITALS — BP 111/59 | HR 69 | Temp 97.1°F | Ht 63.5 in | Wt 268.0 lb

## 2018-12-05 DIAGNOSIS — I10 Essential (primary) hypertension: Secondary | ICD-10-CM | POA: Diagnosis not present

## 2018-12-05 DIAGNOSIS — I480 Paroxysmal atrial fibrillation: Secondary | ICD-10-CM

## 2018-12-05 DIAGNOSIS — E782 Mixed hyperlipidemia: Secondary | ICD-10-CM | POA: Diagnosis not present

## 2018-12-05 MED ORDER — APIXABAN 5 MG PO TABS: 5.0000 mg | ORAL_TABLET | Freq: Two times a day (BID) | ORAL | 11 refills | Status: DC

## 2018-12-05 NOTE — Progress Notes (Signed)
Cardiology Office Note  Date: 12/05/2018   ID: BRIEONA MULHALL, DOB 04-27-1940, MRN [id number]  PCP:  Asencion Noble, MD  Cardiologist:  Rozann Lesches, MD Electrophysiologist:  None   Chief Complaint  Patient presents with  . Cardiac follow-up    History of Present Illness: Kim Dixon is a 78 y.o. female last assessed via telehealth encounter in July.  She is here for a routine follow-up visit.  She tells me that she has been doing better, does not report any recurrent hematuria and has been back on Eliquis since August. She underwent left ureteroscopy with laser lithotripsy and insertion of left ureteral stent in mid July per Dr. Tresa Moore.  She does not report any palpitations or chest pain.  She states that she has been taking her remaining medications as outlined below.  Today [birth year] birthday, she plans to have dinner with her daughter.  Past Medical History:  Diagnosis Date  . Anxiety   . Arthritis   . Depression   . Diverticulosis   . Glaucoma   . Hematuria   . History of kidney stones   . History of pneumonia   . Hyperlipidemia   . Hypertension   . PAF (paroxysmal atrial fibrillation) ([place])   . Stress incontinence   . TIA (transient ischemic attack)    January 2020  . Urinary frequency   . Varicose veins     Past Surgical History:  Procedure Laterality Date  . ABDOMINAL HYSTERECTOMY    . ABLATION SAPHENOUS VEIN W/ RFA Bilateral   . COLONOSCOPY    . COLONOSCOPY N/A 06/18/2013   Procedure: COLONOSCOPY;  Surgeon: Rogene Houston, MD;  [facility];  Service: Endoscopy;  Laterality: N/A;  830  . CYSTOSCOPY WITH RETROGRADE PYELOGRAM, URETEROSCOPY AND STENT PLACEMENT Left 10/18/2018   Procedure: CYSTOSCOPY WITH RETROGRADE PYELOGRAM, URETEROSCOPY AND STENT PLACEMENT;  Surgeon: Alexis Frock, MD;  [facility];  Service: Urology;  Laterality: Left;  1 HR  . EYE SURGERY     laser surgery bilat   . HOLMIUM LASER APPLICATION Left [id number]    Procedure: HOLMIUM LASER APPLICATION;  Surgeon: Alexis Frock, MD;  [facility];  Service: Urology;  Laterality: Left;  . LITHOTRIPSY    . MASS EXCISION N/A 12/07/2017   Procedure: EXCISION 3CM CYST ON BACK;  Surgeon: Aviva Signs, MD;  Location: [facility];  Service: General;  Laterality: N/A;  . TONSILLECTOMY    . TOTAL KNEE ARTHROPLASTY Left 08/25/2014   Procedure: LEFT TOTAL KNEE ARTHROPLASTY;  Surgeon: Paralee Cancel, MD;  [facility];  Service: Orthopedics;  Laterality: Left;    Current Outpatient Medications  Medication Sig Dispense Refill  . Ascorbic Acid (VITAMIN C) 1000 MG tablet Take 3,000 mg by mouth daily.     Marland Kitchen atorvastatin (LIPITOR) 10 MG tablet Take 10 mg by mouth every Monday, Wednesday, and Friday.     . bimatoprost (LUMIGAN) 0.01 % SOLN Place 1 drop into both eyes at bedtime.    . citalopram (CELEXA) 20 MG tablet Take 20 mg by mouth every morning.     Marland Kitchen losartan (COZAAR) 100 MG tablet Take 100 mg by mouth daily.    . timolol (TIMOPTIC) 0.5 % ophthalmic solution Place 1 drop into both eyes daily.    Marland Kitchen torsemide (DEMADEX) 20 MG tablet Take 10 mg by mouth daily.     Marland Kitchen apixaban (ELIQUIS) 5 MG TABS tablet Take 1 tablet (5 mg total) by mouth 2 (two) times  daily. 60 tablet 11   No current facility-administered medications for this visit.    Allergies:  Naproxen sodium   Social History: The patient  reports that she quit smoking about 25 years ago. Her smoking use included cigarettes. She has a 90.00 pack-year smoking history. She has never used smokeless tobacco. She reports current alcohol use. She reports that she does not use drugs.   ROS:  Please see the history of present illness. Otherwise, complete review of systems is positive for none.  All other systems are reviewed and negative.   Physical Exam: VS:  BP (!) 111/59   Pulse 69   Temp (!) 97.1 F (36.2 C)   Ht 5' 3.5" (1.613 m)   Wt 268 lb (121.6 kg)   BMI 46.73 kg/m , BMI Body mass index is 46.73  kg/m.  Wt Readings from Last 3 Encounters:  12/05/18 268 lb (121.6 kg)  10/18/18 266 lb (120.7 kg)  10/17/18 266 lb (120.7 kg)    General: Elderly woman, appears comfortable at rest. HEENT: Conjunctiva and lids normal, wearing a mask. Neck: Supple, no elevated JVP or carotid bruits, no thyromegaly. Lungs: Clear to auscultation, nonlabored breathing at rest. Cardiac: Irregular, no S3, soft systolic murmur. Abdomen: Soft, nontender, bowel sounds present. Extremities: No pitting edema, distal pulses 2+. Skin: Warm and dry. Musculoskeletal: No kyphosis. Neuropsychiatric: Alert and oriented x3, affect grossly appropriate.  ECG:  An ECG dated 12/05/2017 was personally reviewed today and demonstrated:  Sinus rhythm with low voltage.  Recent Labwork: 10/16/2018: BUN 17; Creatinine, Ser 0.73; Hemoglobin 13.6; Platelets 151; Potassium 4.3; Sodium 140, hemoglobin A1c 6.5%  Other Studies Reviewed Today:  Cardiac catheterization 01/01/2009: Normal left main, normal LAD, minimal luminal irregularities within the circumflex, normal RCA. LVEF 60% by ventriculography. Mean RV pressure 11 mmHg. Pulmonary artery systolic pressure 42 mmHg. Pulmonary capillary wedge pressure 20 mmHg. LVEDP 24 mmHg. Cardiac output and index of 6.2 and 2.7 respectively.  Brain MRI and MRA March 2020: IMPRESSION: 1. Mild chronic small vessel disease. No acute or chronic infarct. 2. Normal MRA of the neck.  Event recorder6/06/2018: 14-day event recorder reviewed. Heart rate ranged from 46 bpm up to 109 bpm with average heart rate 63 bpm while in sinus rhythm. Atrial fibrillation was documented on May 5th, 6-hour episode that began at 5:03 AM. Episode of junctional rhythm with bradycardia noted at 1:34 PM on May 5th as well. There were no other sustained arrhythmias, brief bursts of SVT and a brief episode of NSVT. No pauses.  Echocardiogram 07/24/2018: 1. The left ventricle has normal systolic function with  an ejection fraction of 60-65%. The cavity size was normal. Left ventricular diastolic parameters were normal. 2. The right ventricle has normal systolic function. The cavity was normal. There is no increase in right ventricular wall thickness. Right ventricular systolic pressure normal with an estimated pressure of 18.5 mmHg. 3. The aortic valve is tricuspid. Mild aortic annular calcification noted. 4. The mitral valve is grossly normal. There is moderate to severe mitral annular calcification present. There is mild mitral regurgitation. 5. The tricuspid valve is grossly normal. 6. The aortic root is normal in size and structure.  Assessment and Plan:  1.  Paroxysmal atrial fibrillation documented by cardiac monitoring and with prior history of TIA and CHADSVASC score of 6.  She is back on Eliquis since August, reports no recurring hematuria.  We will follow-up CBC and BMET in 3 months.  2.  Essential hypertension, blood pressure is normal  today.  She continues on Cozaar.  3.  Mixed hyperlipidemia, continues on Lipitor with follow-up by Dr. Willey Blade.  Medication Adjustments/Labs and Tests Ordered: Current medicines are reviewed at length with the patient today.  Concerns regarding medicines are outlined above.   Tests Ordered: Orders Placed This Encounter  Procedures  . CBC  . Basic Metabolic Panel (BMET)    Medication Changes: Meds ordered this encounter  Medications  . apixaban (ELIQUIS) 5 MG TABS tablet    Sig: Take 1 tablet (5 mg total) by mouth 2 (two) times daily.    Dispense:  60 tablet    Refill:  11    Disposition:  Follow up 3 months in the [place] office.  Signed, Satira Sark, MD, [facility] 12/05/2018 11:09 AM    [facility] [place]. [address] [place], [place] [address] Phone: [phone]; Fax: [phone]

## 2018-12-05 NOTE — Patient Instructions (Signed)
Medication Instructions: Your physician recommends that you continue on your current medications as directed. Please refer to the Current Medication list given to you today.   Labwork: Cbc, bmet JUST BEFORE follow up apt in 3 months  Procedures/Testing: None  Follow-Up: 3 months with Dr.McDowell  Any Additional Special Instructions Will Be Listed Below (If Applicable).     If you need a refill on your cardiac medications before your next appointment, please call your pharmacy.

## 2018-12-16 ENCOUNTER — Other Ambulatory Visit (HOSPITAL_COMMUNITY): Payer: Self-pay | Admitting: Internal Medicine

## 2018-12-16 DIAGNOSIS — Z780 Asymptomatic menopausal state: Secondary | ICD-10-CM

## 2019-02-05 ENCOUNTER — Other Ambulatory Visit: Payer: Self-pay | Admitting: Surgery

## 2019-02-05 ENCOUNTER — Other Ambulatory Visit (HOSPITAL_COMMUNITY): Payer: Self-pay | Admitting: Surgery

## 2019-02-05 DIAGNOSIS — E210 Primary hyperparathyroidism: Secondary | ICD-10-CM

## 2019-02-12 ENCOUNTER — Ambulatory Visit (HOSPITAL_COMMUNITY)
Admission: RE | Admit: 2019-02-12 | Discharge: 2019-02-12 | Disposition: A | Discharge status: Home / Self Care | Payer: Medicare Other | Source: Ambulatory Visit | Attending: Surgery | Admitting: Surgery

## 2019-02-12 ENCOUNTER — Encounter (HOSPITAL_COMMUNITY)
Admission: RE | Admit: 2019-02-12 | Discharge: 2019-02-12 | Disposition: A | Discharge status: Home / Self Care | Payer: Medicare Other | Source: Ambulatory Visit | Attending: Surgery | Admitting: Surgery

## 2019-02-12 ENCOUNTER — Other Ambulatory Visit: Payer: Self-pay

## 2019-02-12 DIAGNOSIS — E210 Primary hyperparathyroidism: Secondary | ICD-10-CM | POA: Diagnosis not present

## 2019-02-12 MED ORDER — TECHNETIUM TC 99M SESTAMIBI GENERIC - CARDIOLITE
20.0000 | Freq: Once | INTRAVENOUS | Status: AC | PRN
  Administered 2019-02-12: 22 via INTRAVENOUS

## 2019-02-25 ENCOUNTER — Other Ambulatory Visit: Payer: Self-pay | Admitting: Surgery

## 2019-02-25 DIAGNOSIS — E210 Primary hyperparathyroidism: Secondary | ICD-10-CM

## 2019-03-05 ENCOUNTER — Other Ambulatory Visit: Payer: Self-pay

## 2019-03-05 ENCOUNTER — Ambulatory Visit
Admission: RE | Admit: 2019-03-05 | Discharge: 2019-03-05 | Disposition: A | Discharge status: Home / Self Care | Payer: Medicare Other | Source: Ambulatory Visit | Attending: Surgery | Admitting: Surgery

## 2019-03-05 DIAGNOSIS — E210 Primary hyperparathyroidism: Secondary | ICD-10-CM

## 2019-03-05 MED ORDER — IOPAMIDOL (ISOVUE-300) INJECTION 61%
75.0000 mL | Freq: Once | INTRAVENOUS | Status: AC | PRN
  Administered 2019-03-05: 13:00:00 75 mL via INTRAVENOUS

## 2019-03-10 ENCOUNTER — Ambulatory Visit: Payer: Self-pay | Admitting: Surgery

## 2019-03-17 ENCOUNTER — Encounter: Payer: Self-pay | Admitting: Cardiology

## 2019-03-17 ENCOUNTER — Other Ambulatory Visit: Payer: Self-pay

## 2019-03-17 ENCOUNTER — Ambulatory Visit (INDEPENDENT_AMBULATORY_CARE_PROVIDER_SITE_OTHER): Payer: Medicare Other | Admitting: Cardiology

## 2019-03-17 VITALS — BP 132/79 | HR 60 | Ht 63.5 in | Wt 268.0 lb

## 2019-03-17 DIAGNOSIS — I10 Essential (primary) hypertension: Secondary | ICD-10-CM | POA: Diagnosis not present

## 2019-03-17 DIAGNOSIS — I480 Paroxysmal atrial fibrillation: Secondary | ICD-10-CM | POA: Diagnosis not present

## 2019-03-17 NOTE — Progress Notes (Signed)
Cardiology Office Note  Date: 03/17/2019   ID: Kim Dixon, DOB 05/17/1940, MRN [id number]  PCP:  Asencion Noble, MD  Cardiologist:  Rozann Lesches, MD Electrophysiologist:  None   Chief Complaint  Patient presents with  . Cardiac follow-up    History of Present Illness: Kim Dixon is a 78 y.o. female last seen in September.  She presents for a routine visit.  She does not report any significant palpitations, has had no obvious bleeding problems on Eliquis.  She is scheduled to undergo a left inferior parathyroidectomy with Dr. Harlow Asa in late January, will be off Eliquis temporarily.  I personally reviewed her ECG today which shows normal sinus rhythm.  I also went over her medications.  She is not on any AV nodal blockers at this time.  Past Medical History:  Diagnosis Date  . Anxiety   . Arthritis   . Depression   . Diverticulosis   . Glaucoma   . Hematuria   . History of kidney stones   . History of pneumonia   . Hyperlipidemia   . Hypertension   . PAF (paroxysmal atrial fibrillation) ([place])   . Stress incontinence   . TIA (transient ischemic attack)    January 2020  . Urinary frequency   . Varicose veins     Past Surgical History:  Procedure Laterality Date  . ABDOMINAL HYSTERECTOMY    . ABLATION SAPHENOUS VEIN W/ RFA Bilateral   . COLONOSCOPY    . COLONOSCOPY N/A 06/18/2013   Procedure: COLONOSCOPY;  Surgeon: Rogene Houston, MD;  Location[facility];  Service: Endoscopy;  Laterality: N/A;  830  . CYSTOSCOPY WITH RETROGRADE PYELOGRAM, URETEROSCOPY AND STENT PLACEMENT Left 10/18/2018   Procedure: CYSTOSCOPY WITH RETROGRADE PYELOGRAM, URETEROSCOPY AND STENT PLACEMENT;  Surgeon: Alexis Frock, MD;  Location: [facility];  Service: Urology;  Laterality: Left;  1 HR  . EYE SURGERY     laser surgery bilat   . HOLMIUM LASER APPLICATION Left [id number]   Procedure: HOLMIUM LASER APPLICATION;  Surgeon: Alexis Frock, MD;  Location: [facility];  Service:  Urology;  Laterality: Left;  . LITHOTRIPSY    . MASS EXCISION N/A 12/07/2017   Procedure: EXCISION 3CM CYST ON BACK;  Surgeon: Aviva Signs, MD;  Location: [facility];  Service: General;  Laterality: N/A;  . TONSILLECTOMY    . TOTAL KNEE ARTHROPLASTY Left 08/25/2014   Procedure: LEFT TOTAL KNEE ARTHROPLASTY;  Surgeon: Paralee Cancel, MD;  Location: [facility];  Service: Orthopedics;  Laterality: Left;    Current Outpatient Medications  Medication Sig Dispense Refill  . apixaban (ELIQUIS) 5 MG TABS tablet Take 1 tablet (5 mg total) by mouth 2 (two) times daily. 60 tablet 11  . Ascorbic Acid (VITAMIN C) 1000 MG tablet Take 3,000 mg by mouth daily.     Marland Kitchen atorvastatin (LIPITOR) 10 MG tablet Take 10 mg by mouth every Monday, Wednesday, and Friday.     . bimatoprost (LUMIGAN) 0.01 % SOLN Place 1 drop into both eyes at bedtime.    . citalopram (CELEXA) 20 MG tablet Take 20 mg by mouth every morning.     Marland Kitchen losartan (COZAAR) 100 MG tablet Take 100 mg by mouth daily.    . timolol (TIMOPTIC) 0.5 % ophthalmic solution Place 1 drop into both eyes daily.    Marland Kitchen torsemide (DEMADEX) 20 MG tablet Take 10 mg by mouth daily.      No current facility-administered medications for this visit.   Allergies:  Naproxen sodium   Social History: The patient  reports that she quit smoking about 25 years ago. Her smoking use included cigarettes. She has a 90.00 pack-year smoking history. She has never used smokeless tobacco. She reports current alcohol use. She reports that she does not use drugs.   ROS:  Please see the history of present illness. Otherwise, complete review of systems is positive for none.  All other systems are reviewed and negative.   Physical Exam: VS:  BP 132/79   Pulse 60   Ht 5' 3.5" (1.613 m)   Wt 268 lb (121.6 kg)   BMI 46.73 kg/m , BMI Body mass index is 46.73 kg/m.  Wt Readings from Last 3 Encounters:  03/17/19 268 lb (121.6 kg)  12/05/18 268 lb (121.6 kg)  10/18/18 266 lb (120.7 kg)     General: Patient appears comfortable at rest. HEENT: Conjunctiva and lids normal, wearing a mask. Neck: Supple, no elevated JVP or carotid bruits, no thyromegaly. Lungs: Clear to auscultation, nonlabored breathing at rest. Cardiac: Regular rate and rhythm, no S3 or significant systolic murmur. Abdomen: Soft, nontender, bowel sounds present. Extremities: No pitting edema, distal pulses 2+.  ECG:  An ECG dated 12/05/2017 was personally reviewed today and demonstrated:  Sinus rhythm with low voltage.  Recent Labwork: 10/16/2018: BUN 17; Creatinine, Ser 0.73; Hemoglobin 13.6; Platelets 151; Potassium 4.3; Sodium 140   Other Studies Reviewed Today:  Cardiac catheterization 01/01/2009: Normal left main, normal LAD, minimal luminal irregularities within the circumflex, normal RCA. LVEF 60% by ventriculography. Mean RV pressure 11 mmHg. Pulmonary artery systolic pressure 42 mmHg. Pulmonary capillary wedge pressure 20 mmHg. LVEDP 24 mmHg. Cardiac output and index of 6.2 and 2.7 respectively.  Brain MRI and MRA March 2020: IMPRESSION: 1. Mild chronic small vessel disease. No acute or chronic infarct. 2. Normal MRA of the neck.  Event recorder6/06/2018: 14-day event recorder reviewed. Heart rate ranged from 46 bpm up to 109 bpm with average heart rate 63 bpm while in sinus rhythm. Atrial fibrillation was documented on May 5th, 6-hour episode that began at 5:03 AM. Episode of junctional rhythm with bradycardia noted at 1:34 PM on May 5th as well. There were no other sustained arrhythmias, brief bursts of SVT and a brief episode of NSVT. No pauses.  Echocardiogram 07/24/2018: 1. The left ventricle has normal systolic function with an ejection fraction of 60-65%. The cavity size was normal. Left ventricular diastolic parameters were normal. 2. The right ventricle has normal systolic function. The cavity was normal. There is no increase in right ventricular wall thickness. Right  ventricular systolic pressure normal with an estimated pressure of 18.5 mmHg. 3. The aortic valve is tricuspid. Mild aortic annular calcification noted. 4. The mitral valve is grossly normal. There is moderate to severe mitral annular calcification present. There is mild mitral regurgitation. 5. The tricuspid valve is grossly normal. 6. The aortic root is normal in size and structure.  Assessment and Plan:  1.  Paroxysmal atrial fibrillation with CHA2DS2-VASc or of 6.  She is asymptomatic in terms of palpitations and otherwise tolerating Eliquis.  Currently not requiring any AV nodal blockers.  Follow-up CBC and BMET for next visit.  2.  Essential hypertension, systolic in the Q000111Q today.  She continues on Cozaar with follow-up by Dr. Willey Blade.  Medication Adjustments/Labs and Tests Ordered: Current medicines are reviewed at length with the patient today.  Concerns regarding medicines are outlined above.   Tests Ordered: Orders Placed This Encounter  Procedures  .  EKG 12-Lead    Medication Changes: No orders of the defined types were placed in this encounter.   Disposition:  Follow up 6 months in the [place] office.  Signed, Satira Sark, MD, [facility] 03/17/2019 4:29 PM    [facility] [place], [place], [place] [address] Phone: [phone]; Fax: [phone]

## 2019-03-17 NOTE — Patient Instructions (Addendum)
Medication Instructions:  Continue all current medications.  Labwork: BMET, CBC - due just prior to next office visit.   Testing/Procedures: none  Follow-Up: Your physician wants you to follow up in: 6 months.  You will receive a reminder letter in the mail one-two months in advance.  If you don't receive a letter, please call our office to schedule the follow up appointment   Any Other Special Instructions Will Be Listed Below (If Applicable).  If you need a refill on your cardiac medications before your next appointment, please call your pharmacy.

## 2019-03-27 ENCOUNTER — Telehealth: Payer: Self-pay | Admitting: Cardiology

## 2019-03-27 ENCOUNTER — Encounter: Payer: Self-pay | Admitting: *Deleted

## 2019-03-27 NOTE — Telephone Encounter (Signed)
Pt is on Eliquis and is trying to arrange for her COVID vaccine through [facility]. Pt states that before they will vaccinate her, they will need a short letter stating that she is cleared to receive the vaccination while being on Eliquis.  Pt will need to pick the letter up from your office, once available.  Her Mychart is inactive.  Please advise!

## 2019-03-27 NOTE — Telephone Encounter (Signed)
Pt called stating that the Health Dept told her she would need a letter stating it was ok for her to have the COVID vaccine from Dr. Domenic Polite  Please call pt.

## 2019-03-28 NOTE — Telephone Encounter (Signed)
I have forwarded this to the [employer]

## 2019-03-28 NOTE — Telephone Encounter (Signed)
I understand that this issue came up with Dr.Ross yesterday and she actually spoke with the health dept.I spoke with patient and she was going to attempt to get an apt scheduled

## 2019-03-28 NOTE — Telephone Encounter (Signed)
If this remains a widespread issue, we may need to ask one of our Pharm.D.'s to address the issue with some type of form letter that is sent out so that this does not unnecessarily delay patient's access to the vaccine based on misunderstanding.

## 2019-03-28 NOTE — Telephone Encounter (Signed)
There would be no reason at all that she cannot get a COVID-19 vaccine simply because she is on Eliquis.  I do not understand the concern.  I will ask my [place] nurse to contact the health department to see what the problem is.

## 2019-03-28 NOTE — Telephone Encounter (Signed)
Dr. Domenic Polite, Dr. Harrington Challenger called the [facility] Dept yesterday afternoon, to endorse exactly what you are saying.  There was an influx of calls from pts in that [place] requesting these notes, as advised by the [facility].  Hopefully with Dr. Harrington Challenger speaking with the Health Dept, this will lighten the load for you guys. Take care and thank you!

## 2019-04-01 ENCOUNTER — Other Ambulatory Visit: Payer: Self-pay

## 2019-04-01 ENCOUNTER — Ambulatory Visit: Payer: Medicare PPO | Attending: Internal Medicine

## 2019-04-01 ENCOUNTER — Encounter (HOSPITAL_COMMUNITY): Payer: Self-pay | Admitting: Physician Assistant

## 2019-04-01 DIAGNOSIS — Z20822 Contact with and (suspected) exposure to covid-19: Secondary | ICD-10-CM | POA: Diagnosis not present

## 2019-04-03 LAB — NOVEL CORONAVIRUS, NAA: SARS-CoV-2, NAA: NOT DETECTED

## 2019-04-10 ENCOUNTER — Other Ambulatory Visit (HOSPITAL_COMMUNITY): Payer: Medicare Other

## 2019-04-13 ENCOUNTER — Encounter (HOSPITAL_COMMUNITY): Payer: Self-pay | Admitting: Surgery

## 2019-04-13 DIAGNOSIS — E210 Primary hyperparathyroidism: Secondary | ICD-10-CM | POA: Diagnosis present

## 2019-04-13 NOTE — H&P (Addendum)
[facility] Dixon Kim Dixon, P.A.  Kim Dixon DOB: 02/14/1941 Widowed / Language: Kim Dixon / Race: White Female   History of Present Illness  The patient is a 79 year old female who presents with primary hyperparathyroidism.  CHIEF COMPLAINT: primary hyperparathyroidism  Patient is referred by Dr. Alexis Frock for surgical evaluation of suspected primary hyperparathyroidism. Patient has developed nephrolithiasis within the last 5 years. She has had no prior history of hypercalcemia until recent laboratory studies. Her most recent studies show a calcium of 10.2. Intact PTH level was elevated at 84. 24-hour urine collection for calcium was elevated at 272. Patient has not had a bone density scan. She has had no other known complications from hypercalcemia. She denies chronic fatigue. Denies bone or joint pain. She is on anticoagulation for atrial fibrillation and is followed by her cardiologist. She has had no imaging studies performed. There is no family history of endocrine disease or other endocrine neoplasms. She has had no prior Dixon on the head or neck.   Diagnostic Studies History Colonoscopy  1-5 years ago Mammogram  within last year  Allergies  Aleve *ANALGESICS - ANTI-INFLAMMATORY*  Allergies Reconciled   Medication History  Atorvastatin Calcium (10MG  Tablet, Oral) Active. Citalopram Hydrobromide (20MG  Tablet, Oral) Active. Eliquis (5MG  Tablet, Oral) Active. Losartan Potassium (100MG  Tablet, Oral) Active. Lumigan (0.01% Solution, Ophthalmic) Active. Timolol Maleate (0.5% Solution, Ophthalmic) Active. Torsemide (20MG  Tablet, Oral) Active. Medications Reconciled  Social History Alcohol use  Moderate alcohol use. Caffeine use  Coffee. No drug use  Tobacco use  Former smoker.  Family History Alcohol Abuse  Brother, Father. Arthritis  Father. Depression  Brother, Mother, Son. Diabetes Mellitus   Daughter. Heart Disease  Mother. Migraine Headache  Son.  Pregnancy / Birth History Age at menarche  89 years. Age of menopause  60-50 Contraceptive History  Oral contraceptives. Gravida  3 Length (months) of breastfeeding  3-6 Maternal age  6-30 Para  3  Other Problems Depression  Heart murmur  High blood pressure  Kidney Stone    Review of Systems General Present- Chills. Not Present- Appetite Loss, Fatigue, Fever, Night Sweats, Weight Gain and Weight Loss. Skin Present- Rash. Not Present- Change in Wart/Mole, Dryness, Hives, Jaundice, New Lesions, Non-Healing Wounds and Ulcer. HEENT Present- Wears glasses/contact lenses. Not Present- Earache, Hearing Loss, Hoarseness, Nose Bleed, Oral Ulcers, Ringing in the Ears, Seasonal Allergies, Sinus Pain, Sore Throat, Visual Disturbances and Yellow Eyes. Respiratory Present- Snoring and Wheezing. Not Present- Bloody sputum, Chronic Cough and Difficulty Breathing. Breast Not Present- Breast Mass, Breast Pain, Nipple Discharge and Skin Changes. Cardiovascular Present- Leg Cramps, Rapid Heart Rate, Shortness of Breath and Swelling of Extremities. Not Present- Chest Pain, Difficulty Breathing Lying Down and Palpitations. Gastrointestinal Not Present- Abdominal Pain, Bloating, Bloody Stool, Change in Bowel Habits, Chronic diarrhea, Constipation, Difficulty Swallowing, Excessive gas, Gets full quickly at meals, Hemorrhoids, Indigestion, Nausea, Rectal Pain and Vomiting. Female Genitourinary Present- Urgency. Not Present- Frequency, Nocturia, Painful Urination and Pelvic Pain. Neurological Present- Numbness and Trouble walking. Not Present- Decreased Memory, Fainting, Headaches, Seizures, Tingling, Tremor and Weakness. Psychiatric Present- Depression. Not Present- Anxiety, Bipolar, Change in Sleep Pattern, Fearful and Frequent crying. Endocrine Not Present- Cold Intolerance, Excessive Hunger, Hair Changes, Heat Intolerance, Hot flashes  and New Diabetes. Hematology Present- Blood Thinners. Not Present- Easy Bruising, Excessive bleeding, Gland problems, HIV and Persistent Infections.  Vitals Weight: 269 lb Height: 63in Body Surface Area: 2.19 m Body Mass Index: 47.65 kg/m  Temp.: 97.14F  Pulse: 73 (  Regular)  BP: 150/95 (Sitting, Left Arm, Standard)   Physical Exam   GENERAL APPEARANCE Development: normal Nutritional status: normal Gross deformities: none  SKIN Rash, lesions, ulcers: none Induration, erythema: none Nodules: none palpable  EYES Conjunctiva and lids: normal Pupils: equal and reactive Iris: normal bilaterally  EARS, NOSE, MOUTH, THROAT External ears: no lesion or deformity External nose: no lesion or deformity Hearing: grossly normal Patient is wearing a mask.  NECK Symmetric: yes Trachea: midline Thyroid: no palpable nodules in the thyroid bed  CHEST Respiratory effort: normal Retraction or accessory muscle use: no Breath sounds: normal bilaterally Rales, rhonchi, wheeze: none  CARDIOVASCULAR Auscultation: irregular rhythm, normal rate Murmurs: none Pulses: carotid and radial pulse 2+ palpable Lower extremity edema: mild, bilateral Lower extremity varicosities: none  MUSCULOSKELETAL Station and gait: normal Digits and nails: no clubbing or cyanosis Muscle strength: grossly normal all extremities Range of motion: grossly normal all extremities Deformity: none  LYMPHATIC Cervical: none palpable Supraclavicular: none palpable  PSYCHIATRIC Oriented to person, place, and time: yes Mood and affect: normal for situation Judgment and insight: appropriate for situation    Assessment & Plan  PRIMARY HYPERPARATHYROIDISM (E21.0) RECURRENT NEPHROLITHIASIS (N20.0) ATRIAL FIBRILLATION (I48.91)  Pt Education - Pamphlet Given - The Parathyroid Dixon Book: discussed with patient and provided information.  Follow Up - Call [facility] office after tests / studies doneto  discuss further plans  Patient is referred by her urologist for surgical evaluation for possible primary hyperparathyroidism. Patient is provided with written literature on parathyroid Dixon to review at home.  Patient has biochemical evidence of primary hyperparathyroidism. We reviewed the written literature that I provided her with. She has not had any imaging studies. She has not had any significant complications other than nephrolithiasis. I have recommended proceeding with ultrasound examination of the neck as well as a nuclear medicine parathyroid scan. If these studies confirmed the diagnosis of primary hyperparathyroidism and localize the parathyroid adenoma, then I believe she will be an excellent candidate for minimally invasive outpatient Dixon. We would need to stop her anticoagulation for 3 days prior to Dixon. She would be able to resume her anticoagulation on the day following Dixon. If the above studies did not identify the parathyroid adenoma, then we will obtain a 4D CT scan of the neck with parathyroid protocol in hopes of identifying the location of the parathyroid adenoma.  The patient and I discussed the above evaluation. We discussed parathyroid Dixon. We discussed the postoperative recovery. She understands and wishes to proceed.  We will contact her with the results of her studies when they are available.  ADDENDUM  4D-CT positive for left inferior adenoma.  Plan minimally invasive out-patient Dixon.  The risks and benefits of the procedure have been discussed at length with the patient.  The patient understands the proposed procedure, potential alternative treatments, and the course of recovery to be expected.  All of the patient's questions have been answered at this time.  The patient wishes to proceed with Dixon.  Armandina Gemma, MD [facility] Office: [phone]

## 2019-04-14 ENCOUNTER — Ambulatory Visit (HOSPITAL_COMMUNITY): Admission: RE | Admit: 2019-04-14 | Payer: Medicare PPO | Source: Home / Self Care | Admitting: Surgery

## 2019-04-14 ENCOUNTER — Encounter (HOSPITAL_COMMUNITY): Admission: RE | Payer: Self-pay | Source: Home / Self Care

## 2019-04-14 SURGERY — PARATHYROIDECTOMY
Anesthesia: General | Laterality: Left

## 2019-04-18 ENCOUNTER — Encounter (HOSPITAL_COMMUNITY)
Admission: RE | Admit: 2019-04-18 | Discharge: 2019-04-18 | Disposition: A | Discharge status: Home / Self Care | Payer: Medicare PPO | Source: Ambulatory Visit | Attending: Surgery | Admitting: Surgery

## 2019-04-18 ENCOUNTER — Other Ambulatory Visit (HOSPITAL_COMMUNITY): Payer: Medicare PPO

## 2019-04-18 ENCOUNTER — Encounter (HOSPITAL_COMMUNITY): Payer: Self-pay

## 2019-04-18 ENCOUNTER — Other Ambulatory Visit (HOSPITAL_COMMUNITY)
Admission: RE | Admit: 2019-04-18 | Discharge: 2019-04-18 | Disposition: A | Discharge status: Home / Self Care | Payer: Medicare PPO | Source: Ambulatory Visit | Attending: Surgery | Admitting: Surgery

## 2019-04-18 ENCOUNTER — Other Ambulatory Visit: Payer: Self-pay

## 2019-04-18 DIAGNOSIS — Z20822 Contact with and (suspected) exposure to covid-19: Secondary | ICD-10-CM | POA: Insufficient documentation

## 2019-04-18 DIAGNOSIS — Z01812 Encounter for preprocedural laboratory examination: Secondary | ICD-10-CM | POA: Insufficient documentation

## 2019-04-18 LAB — BASIC METABOLIC PANEL
Anion gap: 7 (ref 5–15)
BUN: 23 mg/dL (ref 8–23)
CO2: 32 mmol/L (ref 22–32)
Calcium: 10.2 mg/dL (ref 8.9–10.3)
Chloride: 101 mmol/L (ref 98–111)
Creatinine, Ser: 0.81 mg/dL (ref 0.44–1.00)
GFR calc Af Amer: >60 mL/min (ref >60)
GFR calc non Af Amer: >60 mL/min (ref >60)
Glucose, Bld: 126 mg/dL — ABNORMAL HIGH (ref 70–99)
Potassium: 4 mmol/L (ref 3.5–5.1)
Sodium: 140 mmol/L (ref 135–145)

## 2019-04-18 LAB — CBC
HCT: 43.4 % (ref 36.0–46.0)
Hemoglobin: 13.6 g/dL (ref 12.0–15.0)
MCH: 31.6 pg (ref 26.0–34.0)
MCHC: 31.3 g/dL (ref 30.0–36.0)
MCV: 100.7 fL — ABNORMAL HIGH (ref 80.0–100.0)
Platelets: 184 10*3/uL (ref 150–400)
RBC: 4.31 MIL/uL (ref 3.87–5.11)
RDW: 14 % (ref 11.5–15.5)
WBC: 6.7 10*3/uL (ref 4.0–10.5)
nRBC: 0 % (ref 0.0–0.2)

## 2019-04-18 LAB — SARS CORONAVIRUS 2 (TAT 6-24 HRS): SARS Coronavirus 2: NEGATIVE

## 2019-04-18 NOTE — Progress Notes (Signed)
PCP - Asencion Noble. LOV: 12/28 EPIC Cardiologist - clearance: Rozann Lesches. LOV: 03/17/19  Chest x-ray -  EKG - 12/28 EPIC Stress Test -  ECHO - 07/24/18 EPIC Cardiac Cath -   Sleep Study -  CPAP -   Fasting Blood Sugar -  Checks Blood Sugar _____ times a day  Blood Thinner Instructions:Eliquis will stop 1/29. Aspirin Instructions: Last Dose:  Anesthesia review:   Patient denies shortness of breath, fever, cough and chest pain at PAT appointment   Patient verbalized understanding of instructions that were given to them at the PAT appointment. Patient was also instructed that they will need to review over the PAT instructions again at home before surgery.

## 2019-04-18 NOTE — Patient Instructions (Addendum)
DUE TO COVID-19 ONLY ONE VISITOR IS ALLOWED TO COME WITH YOU AND STAY IN THE WAITING ROOM ONLY DURING PRE OP AND PROCEDURE DAY OF SURGERY. THE 1 VISITOR MAY VISIT WITH YOU AFTER SURGERY IN YOUR PRIVATE ROOM DURING VISITING HOURS ONLY!  YOU NEED TO HAVE A COVID 19 TEST ON: 04/18/19 , THIS TEST MUST BE DONE BEFORE SURGERY, COME  [place] [place], [place] , [address].  ([place]) ONCE YOUR COVID TEST IS COMPLETED, PLEASE BEGIN THE QUARANTINE INSTRUCTIONS AS OUTLINED IN YOUR HANDOUT.                [name]     Your procedure is scheduled on: 04/22/2019   Report to [facility] Main  Entrance   Report to [facility] at: 5:30 AM     Call this number if you have problems the morning of surgery [phone]    Remember: Do not eat food or drink liquids :After Midnight.   BRUSH YOUR TEETH MORNING OF SURGERY AND RINSE YOUR MOUTH OUT, NO CHEWING GUM CANDY OR MINTS.                                     You may not have any metal on your body including hair pins and              piercings  Do not wear jewelry, make-up, lotions, powders or perfumes, deodorant             Do not wear nail polish on your fingernails.  Do not shave  48 hours prior to surgery.              Men may shave face and neck.   Do not bring valuables to the hospital. [place].  Contacts, dentures or bridgework may not be worn into surgery.  Leave suitcase in the car. After surgery it may be brought to your room.     Patients discharged the day of surgery will not be allowed to drive home. IF YOU ARE HAVING SURGERY AND GOING HOME THE SAME DAY, YOU MUST HAVE AN ADULT TO DRIVE YOU HOME AND BE WITH YOU FOR 24 HOURS. YOU MAY GO HOME BY TAXI OR UBER OR ORTHERWISE, BUT AN ADULT MUST ACCOMPANY YOU HOME AND STAY WITH YOU FOR 24 HOURS.  Name and phone number of your driver:  Special Instructions: N/A              Please read over the following  fact sheets you were given: _____________________________________________________________________             [facility] - Preparing for Surgery Before surgery, you can play an important role.  Because skin is not sterile, your skin needs to be as free of germs as possible.  You can reduce the number of germs on your skin by washing with CHG (chlorahexidine gluconate) soap before surgery.  CHG is an antiseptic cleaner which kills germs and bonds with the skin to continue killing germs even after washing. Please DO NOT use if you have an allergy to CHG or antibacterial soaps.  If your skin becomes reddened/irritated stop using the CHG and inform your nurse when you arrive at Short Stay. Do not shave (including legs and underarms) for at least  48 hours prior to the first CHG shower.  You may shave your face/neck. Please follow these instructions carefully:  1.  Shower with CHG Soap the night before surgery and the  morning of Surgery.  2.  If you choose to wash your hair, wash your hair first as usual with your  normal  shampoo.  3.  After you shampoo, rinse your hair and body thoroughly to remove the  shampoo.                           4.  Use CHG as you would any other liquid soap.  You can apply chg directly  to the skin and wash                       Gently with a scrungie or clean washcloth.  5.  Apply the CHG Soap to your body ONLY FROM THE NECK DOWN.   Do not use on face/ open                           Wound or open sores. Avoid contact with eyes, ears mouth and genitals (private parts).                       Wash face,  Genitals (private parts) with your normal soap.             6.  Wash thoroughly, paying special attention to the area where your surgery  will be performed.  7.  Thoroughly rinse your body with warm water from the neck down.  8.  DO NOT shower/wash with your normal soap after using and rinsing off  the CHG Soap.                9.  Pat yourself dry with a clean towel.             10.  Wear clean pajamas.            11.  Place clean sheets on your bed the night of your first shower and do not  sleep with pets. Day of Surgery : Do not apply any lotions/deodorants the morning of surgery.  Please wear clean clothes to the hospital/surgery center.  FAILURE TO FOLLOW THESE INSTRUCTIONS MAY RESULT IN THE CANCELLATION OF YOUR SURGERY PATIENT SIGNATURE_________________________________  NURSE SIGNATURE__________________________________  ________________________________________________________________________

## 2019-04-21 MED ORDER — DEXTROSE 5 % IV SOLN
3.0000 g | INTRAVENOUS | Status: AC
  Administered 2019-04-22: 3 g via INTRAVENOUS
  Filled 2019-04-21: qty 3

## 2019-04-21 NOTE — Progress Notes (Signed)
Anesthesia Chart Review   Case: [id number] Date/Time: 04/22/19 [id number]   Procedure: LEFT INFERIOR PARATHYROIDECTOMY (Left )   Anesthesia type: General   Pre-op diagnosis: PRIMARY HYPERPARATHYROIDISIM   Location: [facility] ROOM 01 / [facility]   Surgeons: Armandina Gemma, MD      DISCUSSION:78 y.o. former smoker (54 pack years, quit 04/04/93) with h/o HTN, HLD, PAF (on Eliquis), CVA, primary hyperparathyroidism scheduled for above procedure 04/22/19 with Dr. Armandina Gemma.   Pt last seen by cardiologist, Dr. Rozann Lesches, 03/17/2019.  Stable at this visit.  Per Dr. Domenic Polite ok to hold Eliquis for parathyroidectomy.  Pt reports last dose of Eliquis 04/18/19.    Anticipate pt can proceed with planned procedure barring acute status change.   VS: There were no vitals taken for this visit.  PROVIDERS: Asencion Noble, MD is PCP   Rozann Lesches, MD is Cardiologist  LABS: Labs reviewed: Acceptable for surgery. (all labs ordered are listed, but only abnormal results are displayed)  Labs Reviewed - No data to display   IMAGES:   EKG: 03/17/2019 Rate 60 bpm  Normal sinus rhythm   CV: Echo 07/24/2018 IMPRESSIONS    1. The left ventricle has normal systolic function with an ejection  fraction of 60-65%. The cavity size was normal. Left ventricular diastolic  parameters were normal.  2. The right ventricle has normal systolic function. The cavity was  normal. There is no increase in right ventricular wall thickness. Right  ventricular systolic pressure normal with an estimated pressure of 18.5  mmHg.  3. The aortic valve is tricuspid. Mild aortic annular calcification  noted.  4. The mitral valve is grossly normal. There is moderate to severe mitral  annular calcification present. There is mild mitral regurgitation.  5. The tricuspid valve is grossly normal.  6. The aortic root is normal in size and structure. Past Medical History:  Diagnosis Date  . Anxiety   . Arthritis   . Depression   .  Diverticulosis   . Dyspnea   . Dysrhythmia    AFIB  . Glaucoma   . Heart murmur   . Hematuria   . History of kidney stones   . History of pneumonia   . Hyperlipidemia   . Hypertension   . PAF (paroxysmal atrial fibrillation) ([place])   . Pneumonia   . Stress incontinence   . Stroke [facility])    mini stroke on 03/2018  . TIA (transient ischemic attack)    January 2020  . Urinary frequency   . Varicose veins     Past Surgical History:  Procedure Laterality Date  . ABDOMINAL HYSTERECTOMY    . ABLATION SAPHENOUS VEIN W/ RFA Bilateral   . COLONOSCOPY    . COLONOSCOPY N/A 06/18/2013   Procedure: COLONOSCOPY;  Surgeon: Rogene Houston, MD;  [facility];  Service: Endoscopy;  Laterality: N/A;  830  . CYSTOSCOPY WITH RETROGRADE PYELOGRAM, URETEROSCOPY AND STENT PLACEMENT Left 10/18/2018   Procedure: CYSTOSCOPY WITH RETROGRADE PYELOGRAM, URETEROSCOPY AND STENT PLACEMENT;  Surgeon: Alexis Frock, MD;  [facility];  Service: Urology;  Laterality: Left;  1 HR  . EYE SURGERY     laser surgery bilat   . HOLMIUM LASER APPLICATION Left [id number]   Procedure: HOLMIUM LASER APPLICATION;  Surgeon: Alexis Frock, MD;  [facility];  Service: Urology;  Laterality: Left;  . LITHOTRIPSY    . MASS EXCISION N/A 12/07/2017   Procedure: EXCISION 3CM CYST ON BACK;  Surgeon: Aviva Signs, MD;  Location:  [facility];  Service: General;  Laterality: N/A;  . TONSILLECTOMY    . TOTAL KNEE ARTHROPLASTY Left 08/25/2014   Procedure: LEFT TOTAL KNEE ARTHROPLASTY;  Surgeon: Paralee Cancel, MD;  [facility];  Service: Orthopedics;  Laterality: Left;    MEDICATIONS: . apixaban (ELIQUIS) 5 MG TABS tablet  . Ascorbic Acid (VITAMIN C) 1000 MG tablet  . atorvastatin (LIPITOR) 10 MG tablet  . bimatoprost (LUMIGAN) 0.01 % SOLN  . citalopram (CELEXA) 20 MG tablet  . furosemide (LASIX) 20 MG tablet  . losartan (COZAAR) 100 MG tablet  . Menthol, Topical Analgesic, (BIOFREEZE ROLL-ON EX)  . timolol  (TIMOPTIC) 0.5 % ophthalmic solution  . Vitamin D3 (VITAMIN D) 25 MCG tablet   No current facility-administered medications for this encounter.   [name] ON 04/22/2019] ceFAZolin (ANCEF) 3 g in dextrose 5 % 50 mL IVPB    [name] [facility] Pre-Surgical Testing [phone] 04/21/19  2:10 PM

## 2019-04-21 NOTE — Anesthesia Preprocedure Evaluation (Addendum)
Anesthesia Evaluation  Patient identified by MRN, date of birth, ID band Patient awake    Reviewed: Allergy & Precautions, NPO status , Patient's Chart, lab work & pertinent test results  History of Anesthesia Complications Negative for: history of anesthetic complications  Airway Mallampati: II  TM Distance: >3 FB Neck ROM: Full    Dental  (+) Teeth Intact   Pulmonary former smoker (61 PY),    Pulmonary exam normal        Cardiovascular hypertension, Normal cardiovascular exam+ dysrhythmias Atrial Fibrillation      Neuro/Psych PSYCHIATRIC DISORDERS Anxiety Depression TIA   GI/Hepatic negative GI ROS, Neg liver ROS,   Endo/Other  Morbid obesityhyperparathyroidism  Renal/GU negative Renal ROS  negative genitourinary   Musculoskeletal  (+) Arthritis ,   Abdominal (+) + obese,   Peds  Hematology negative hematology ROS (+)   Anesthesia Other Findings Eliquis  Echo 07/24/18: EF 60-65%, mild MR  Reproductive/Obstetrics                            Anesthesia Physical Anesthesia Plan  ASA: III  Anesthesia Plan: General   Post-op Pain Management:    Induction: Intravenous  PONV Risk Score and Plan: Ondansetron, Dexamethasone, Treatment may vary due to age or medical condition and Midazolam  Airway Management Planned: Oral ETT  Additional Equipment: None  Intra-op Plan:   Post-operative Plan: Extubation in OR  Informed Consent: I have reviewed the patients History and Physical, chart, labs and discussed the procedure including the risks, benefits and alternatives for the proposed anesthesia with the patient or authorized representative who has indicated his/her understanding and acceptance.     Dental advisory given  Plan Discussed with:   Anesthesia Plan Comments:        Anesthesia Quick Evaluation

## 2019-04-22 ENCOUNTER — Ambulatory Visit (HOSPITAL_COMMUNITY): Payer: Medicare PPO | Admitting: Physician Assistant

## 2019-04-22 ENCOUNTER — Other Ambulatory Visit: Payer: Self-pay

## 2019-04-22 ENCOUNTER — Ambulatory Visit (HOSPITAL_COMMUNITY)
Admission: RE | Admit: 2019-04-22 | Discharge: 2019-04-22 | Disposition: A | Discharge status: Home / Self Care | Payer: Medicare PPO | Attending: Surgery | Admitting: Surgery

## 2019-04-22 ENCOUNTER — Encounter (HOSPITAL_COMMUNITY)
Admission: RE | Disposition: A | Discharge status: Home / Self Care | Payer: Self-pay | Source: Home / Self Care | Attending: Surgery

## 2019-04-22 ENCOUNTER — Encounter (HOSPITAL_COMMUNITY): Payer: Self-pay | Admitting: Surgery

## 2019-04-22 ENCOUNTER — Ambulatory Visit (HOSPITAL_COMMUNITY): Payer: Medicare PPO | Admitting: Anesthesiology

## 2019-04-22 DIAGNOSIS — I4891 Unspecified atrial fibrillation: Secondary | ICD-10-CM | POA: Insufficient documentation

## 2019-04-22 DIAGNOSIS — G459 Transient cerebral ischemic attack, unspecified: Secondary | ICD-10-CM | POA: Diagnosis not present

## 2019-04-22 DIAGNOSIS — Z6841 Body Mass Index (BMI) 40.0 and over, adult: Secondary | ICD-10-CM | POA: Insufficient documentation

## 2019-04-22 DIAGNOSIS — I10 Essential (primary) hypertension: Secondary | ICD-10-CM | POA: Insufficient documentation

## 2019-04-22 DIAGNOSIS — Z87442 Personal history of urinary calculi: Secondary | ICD-10-CM | POA: Diagnosis not present

## 2019-04-22 DIAGNOSIS — F419 Anxiety disorder, unspecified: Secondary | ICD-10-CM | POA: Diagnosis not present

## 2019-04-22 DIAGNOSIS — E210 Primary hyperparathyroidism: Secondary | ICD-10-CM | POA: Insufficient documentation

## 2019-04-22 DIAGNOSIS — Z7901 Long term (current) use of anticoagulants: Secondary | ICD-10-CM | POA: Diagnosis not present

## 2019-04-22 DIAGNOSIS — Z87891 Personal history of nicotine dependence: Secondary | ICD-10-CM | POA: Insufficient documentation

## 2019-04-22 DIAGNOSIS — Z8673 Personal history of transient ischemic attack (TIA), and cerebral infarction without residual deficits: Secondary | ICD-10-CM | POA: Insufficient documentation

## 2019-04-22 DIAGNOSIS — D351 Benign neoplasm of parathyroid gland: Secondary | ICD-10-CM | POA: Insufficient documentation

## 2019-04-22 DIAGNOSIS — F329 Major depressive disorder, single episode, unspecified: Secondary | ICD-10-CM | POA: Diagnosis not present

## 2019-04-22 DIAGNOSIS — Z79899 Other long term (current) drug therapy: Secondary | ICD-10-CM | POA: Diagnosis not present

## 2019-04-22 HISTORY — PX: PARATHYROIDECTOMY: SHX19

## 2019-04-22 SURGERY — PARATHYROIDECTOMY
Anesthesia: General | Site: Neck | Laterality: Left

## 2019-04-22 MED ORDER — ONDANSETRON HCL 4 MG/2ML IJ SOLN
INTRAMUSCULAR | Status: AC
  Filled 2019-04-22: qty 2

## 2019-04-22 MED ORDER — FENTANYL CITRATE (PF) 100 MCG/2ML IJ SOLN
INTRAMUSCULAR | Status: AC
  Filled 2019-04-22: qty 2

## 2019-04-22 MED ORDER — FENTANYL CITRATE (PF) 250 MCG/5ML IJ SOLN
INTRAMUSCULAR | Status: AC
  Filled 2019-04-22: qty 5

## 2019-04-22 MED ORDER — LIDOCAINE 2% (20 MG/ML) 5 ML SYRINGE
INTRAMUSCULAR | Status: AC
  Filled 2019-04-22: qty 5

## 2019-04-22 MED ORDER — OXYCODONE HCL 5 MG/5ML PO SOLN: 5.0000 mg | Freq: Once | ORAL | Status: DC | PRN

## 2019-04-22 MED ORDER — OXYCODONE HCL 5 MG PO TABS: 5.0000 mg | ORAL_TABLET | Freq: Once | ORAL | Status: DC | PRN

## 2019-04-22 MED ORDER — TRAMADOL HCL 50 MG PO TABS: 50.0000 mg | ORAL_TABLET | Freq: Four times a day (QID) | ORAL | 0 refills | Status: DC | PRN

## 2019-04-22 MED ORDER — SUGAMMADEX SODIUM 500 MG/5ML IV SOLN
INTRAVENOUS | Status: DC | PRN
  Administered 2019-04-22: 300 mg via INTRAVENOUS

## 2019-04-22 MED ORDER — ROCURONIUM BROMIDE 10 MG/ML (PF) SYRINGE
PREFILLED_SYRINGE | INTRAVENOUS | Status: DC | PRN
  Administered 2019-04-22: 60 mg via INTRAVENOUS
  Administered 2019-04-22: 20 mg via INTRAVENOUS

## 2019-04-22 MED ORDER — ONDANSETRON HCL 4 MG/2ML IJ SOLN
4.0000 mg | Freq: Once | INTRAMUSCULAR | Status: AC | PRN
  Administered 2019-04-22: 4 mg via INTRAVENOUS

## 2019-04-22 MED ORDER — CHLORHEXIDINE GLUCONATE CLOTH 2 % EX PADS: 6.0000 | MEDICATED_PAD | Freq: Once | CUTANEOUS | Status: DC

## 2019-04-22 MED ORDER — DEXAMETHASONE SODIUM PHOSPHATE 10 MG/ML IJ SOLN
INTRAMUSCULAR | Status: DC | PRN
  Administered 2019-04-22: 10 mg via INTRAVENOUS

## 2019-04-22 MED ORDER — 0.9 % SODIUM CHLORIDE (POUR BTL) OPTIME
TOPICAL | Status: DC | PRN
  Administered 2019-04-22: 1000 mL

## 2019-04-22 MED ORDER — PROPOFOL 500 MG/50ML IV EMUL
INTRAVENOUS | Status: AC
  Filled 2019-04-22: qty 50

## 2019-04-22 MED ORDER — BUPIVACAINE HCL 0.25 % IJ SOLN
INTRAMUSCULAR | Status: AC
  Filled 2019-04-22: qty 1

## 2019-04-22 MED ORDER — LACTATED RINGERS IV SOLN: INTRAVENOUS | Status: DC

## 2019-04-22 MED ORDER — PROPOFOL 10 MG/ML IV BOLUS
INTRAVENOUS | Status: AC
  Filled 2019-04-22: qty 40

## 2019-04-22 MED ORDER — ONDANSETRON HCL 4 MG/2ML IJ SOLN
INTRAMUSCULAR | Status: DC | PRN
  Administered 2019-04-22: 4 mg via INTRAVENOUS

## 2019-04-22 MED ORDER — BUPIVACAINE HCL 0.25 % IJ SOLN
INTRAMUSCULAR | Status: DC | PRN
  Administered 2019-04-22: 10 mL

## 2019-04-22 MED ORDER — DEXAMETHASONE SODIUM PHOSPHATE 10 MG/ML IJ SOLN
INTRAMUSCULAR | Status: AC
  Filled 2019-04-22: qty 1

## 2019-04-22 MED ORDER — SUGAMMADEX SODIUM 500 MG/5ML IV SOLN
INTRAVENOUS | Status: AC
  Filled 2019-04-22: qty 5

## 2019-04-22 MED ORDER — FENTANYL CITRATE (PF) 100 MCG/2ML IJ SOLN
25.0000 ug | INTRAMUSCULAR | Status: DC | PRN
  Administered 2019-04-22 (×3): 50 ug via INTRAVENOUS

## 2019-04-22 MED ORDER — BUPIVACAINE HCL (PF) 0.25 % IJ SOLN
INTRAMUSCULAR | Status: AC
  Filled 2019-04-22: qty 30

## 2019-04-22 MED ORDER — PROPOFOL 10 MG/ML IV BOLUS
INTRAVENOUS | Status: DC | PRN
  Administered 2019-04-22: 150 mg via INTRAVENOUS

## 2019-04-22 MED ORDER — MIDAZOLAM HCL 2 MG/2ML IJ SOLN
INTRAMUSCULAR | Status: AC
  Filled 2019-04-22: qty 2

## 2019-04-22 MED ORDER — FENTANYL CITRATE (PF) 250 MCG/5ML IJ SOLN
INTRAMUSCULAR | Status: DC | PRN
  Administered 2019-04-22: 50 ug via INTRAVENOUS
  Administered 2019-04-22 (×2): 100 ug via INTRAVENOUS

## 2019-04-22 MED ORDER — LIDOCAINE 2% (20 MG/ML) 5 ML SYRINGE
INTRAMUSCULAR | Status: DC | PRN
  Administered 2019-04-22: 100 mg via INTRAVENOUS

## 2019-04-22 MED ORDER — MIDAZOLAM HCL 5 MG/5ML IJ SOLN
INTRAMUSCULAR | Status: DC | PRN
  Administered 2019-04-22: 1 mg via INTRAVENOUS

## 2019-04-22 SURGICAL SUPPLY — 32 items
ADH SKN CLS APL DERMABOND .7 (GAUZE/BANDAGES/DRESSINGS) ×1
APL PRP STRL LF DISP 70% ISPRP (MISCELLANEOUS) ×1
ATTRACTOMAT 16X20 MAGNETIC DRP (DRAPES) ×3 IMPLANT
BLADE SURG 15 STRL LF DISP TIS (BLADE) ×1 IMPLANT
BLADE SURG 15 STRL SS (BLADE) ×3
CHLORAPREP W/TINT 26 (MISCELLANEOUS) ×3 IMPLANT
CLIP VESOCCLUDE MED 6/CT (CLIP) ×6 IMPLANT
CLIP VESOCCLUDE SM WIDE 6/CT (CLIP) ×6 IMPLANT
COVER SURGICAL LIGHT HANDLE (MISCELLANEOUS) ×3 IMPLANT
COVER WAND RF STERILE (DRAPES) ×3 IMPLANT
DERMABOND ADVANCED (GAUZE/BANDAGES/DRESSINGS) ×2
DERMABOND ADVANCED .7 DNX12 (GAUZE/BANDAGES/DRESSINGS) ×1 IMPLANT
DRAPE LAPAROTOMY T 98X78 PEDS (DRAPES) ×3 IMPLANT
ELECT REM PT RETURN 15FT ADLT (MISCELLANEOUS) ×3 IMPLANT
GAUZE 4X4 16PLY RFD (DISPOSABLE) ×3 IMPLANT
GLOVE SURG ORTHO 8.0 STRL STRW (GLOVE) ×3 IMPLANT
GOWN STRL REUS W/TWL XL LVL3 (GOWN DISPOSABLE) ×9 IMPLANT
HEMOSTAT SURGICEL 2X4 FIBR (HEMOSTASIS) ×3 IMPLANT
ILLUMINATOR WAVEGUIDE N/F (MISCELLANEOUS) IMPLANT
KIT BASIN OR (CUSTOM PROCEDURE TRAY) ×3 IMPLANT
KIT TURNOVER KIT A (KITS) IMPLANT
NEEDLE HYPO 25X1 1.5 SAFETY (NEEDLE) ×3 IMPLANT
PACK BASIC VI WITH GOWN DISP (CUSTOM PROCEDURE TRAY) ×3 IMPLANT
PENCIL SMOKE EVACUATOR (MISCELLANEOUS) ×3 IMPLANT
SUT MNCRL AB 4-0 PS2 18 (SUTURE) ×3 IMPLANT
SUT VIC AB 3-0 SH 18 (SUTURE) ×3 IMPLANT
SYR BULB IRRIGATION 50ML (SYRINGE) ×3 IMPLANT
SYR CONTROL 10ML LL (SYRINGE) ×3 IMPLANT
TOWEL OR 17X26 10 PK STRL BLUE (TOWEL DISPOSABLE) ×3 IMPLANT
TOWEL OR NON WOVEN STRL DISP B (DISPOSABLE) ×3 IMPLANT
TUBING CONNECTING 10 (TUBING) ×2 IMPLANT
TUBING CONNECTING 10' (TUBING) ×1

## 2019-04-22 NOTE — Transfer of Care (Signed)
Immediate Anesthesia Transfer of Care Note  Patient: Kim Dixon  Procedure(s) Performed: LEFT INFERIOR PARATHYROIDECTOMY (Left Neck)  Patient Location: PACU  Anesthesia Type:General  Level of Consciousness: awake, alert  and oriented  Airway & Oxygen Therapy: Patient Spontanous Breathing and Patient connected to face mask oxygen  Post-op Assessment: Report given to RN and Post -op Vital signs reviewed and stable  Post vital signs: Reviewed and stable  Last Vitals:  Vitals Value Taken Time  BP 174/76 04/22/19 0833  Temp    Pulse 66 04/22/19 0839  Resp 17 04/22/19 0839  SpO2 99 % 04/22/19 0839  Vitals shown include unvalidated device data.  Last Pain:  Vitals:   04/22/19 0635  TempSrc:   PainSc: 0-No pain         Complications: No apparent anesthesia complications

## 2019-04-22 NOTE — Op Note (Signed)
OPERATIVE REPORT - PARATHYROIDECTOMY  Preoperative diagnosis: Primary hyperparathyroidism  Postop diagnosis: Same  Procedure: Left inferior minimally invasive parathyroidectomy  Surgeon:  Armandina Gemma, MD  Assistant: Erroll Luna, MD  Anesthesia: General endotracheal  Estimated blood loss: Minimal  Preparation: ChloraPrep  Indications: Patient is referred by Dr. Alexis Frock for surgical evaluation of suspected primary hyperparathyroidism. Patient has developed nephrolithiasis within the last 5 years. She has had no prior history of hypercalcemia until recent laboratory studies. Her most recent studies show a calcium of 10.2. Intact PTH level was elevated at 84. 24-hour urine collection for calcium was elevated at 272.  4D-CT localized a left inferior adenoma.  Patient now comes for minimally invasive surgery.  Procedure: The patient was prepared in the pre-operative holding area. The patient was brought to the operating room and placed in a supine position on the operating room table. Following administration of general anesthesia, the patient was positioned and then prepped and draped in the usual strict aseptic fashion. After ascertaining that an adequate level of anesthesia been achieved, a neck incision was made with a #15 blade. Dissection was carried through subcutaneous tissues and platysma. Hemostasis was obtained with the electrocautery. Skin flaps were developed circumferentially and a Weitlander retractor was placed for exposure.  Strap muscles were incised in the midline. Strap muscles were reflected lateralley exposing the thyroid lobe. With gentle blunt dissection the thyroid lobe was mobilized.  Dissection was carried through adipose tissue and an enlarged parathyroid gland was identified. It was gently mobilized. Vascular structures were divided between small ligaclips. Care was taken to avoid the recurrent laryngeal nerve and the esophagus. The parathyroid gland was  completely excised. It was submitted to pathology where frozen section confirmed parathyroid tissue consistent with adenoma.  Neck was irrigated with warm saline and good hemostasis was noted. Fibrillar was placed in the operative field. Strap muscles were approximated in the midline with interrupted 3-0 Vicryl sutures. Platysma was closed with interrupted 3-0 Vicryl sutures. Marcaine was infiltrated circumferentially. Skin was closed with a running 4-0 Monocryl subcuticular suture. Wound was washed and dried and Dermabond was applied. Patient was awakened from anesthesia and brought to the recovery room. The patient tolerated the procedure well.   Armandina Gemma, MD [facility] Office: [phone]

## 2019-04-22 NOTE — Anesthesia Procedure Notes (Signed)
Procedure Name: Intubation Date/Time: 04/22/2019 7:39 AM Performed by: Lollie Sails, CRNA Pre-anesthesia Checklist: Patient identified, Emergency Drugs available, Suction available, Patient being monitored and Timeout performed Patient Re-evaluated:Patient Re-evaluated prior to induction Oxygen Delivery Method: Circle system utilized Preoxygenation: Pre-oxygenation with 100% oxygen Induction Type: IV induction Ventilation: Mask ventilation without difficulty and Oral airway inserted - appropriate to patient size Laryngoscope Size: Sabra Heck and 2 Grade View: Grade II Tube type: Oral Tube size: 7.5 mm Airway Equipment and Method: Stylet Placement Confirmation: ETT inserted through vocal cords under direct vision,  positive ETCO2 and breath sounds checked- equal and bilateral Secured at: 23 cm Tube secured with: Tape Dental Injury: Teeth and Oropharynx as per pre-operative assessment

## 2019-04-22 NOTE — Interval H&P Note (Signed)
History and Physical Interval Note:  04/22/2019 6:57 AM  Kim Dixon  has presented today for surgery, with the diagnosis of PRIMARY HYPERPARATHYROIDISIM.  The various methods of treatment have been discussed with the patient and family. After consideration of risks, benefits and other options for treatment, the patient has consented to    Procedure(s): LEFT INFERIOR PARATHYROIDECTOMY (Left) as a surgical intervention.    The patient's history has been reviewed, patient examined, no change in status, stable for surgery.  I have reviewed the patient's chart and labs.  Questions were answered to the patient's satisfaction.    Armandina Gemma, MD [facility] Office: [place]

## 2019-04-22 NOTE — Anesthesia Postprocedure Evaluation (Signed)
Anesthesia Post Note  Patient: NAJLA WISWELL  Procedure(s) Performed: LEFT INFERIOR PARATHYROIDECTOMY (Left Neck)     Patient location during evaluation: PACU Anesthesia Type: General Level of consciousness: awake and alert Pain management: pain level controlled Vital Signs Assessment: post-procedure vital signs reviewed and stable Respiratory status: spontaneous breathing, nonlabored ventilation and respiratory function stable Cardiovascular status: blood pressure returned to baseline and stable Postop Assessment: no apparent nausea or vomiting Anesthetic complications: no    Last Vitals:  Vitals:   04/22/19 1015 04/22/19 1045  BP: (!) 161/66   Pulse: 69   Resp:    Temp:    SpO2: 90% 91%    Last Pain:  Vitals:   04/22/19 1421  TempSrc:   PainSc: 3                  Lidia Collum

## 2019-04-23 LAB — SURGICAL PATHOLOGY

## 2019-04-30 DIAGNOSIS — H25813 Combined forms of age-related cataract, bilateral: Secondary | ICD-10-CM | POA: Diagnosis not present

## 2019-04-30 DIAGNOSIS — H401133 Primary open-angle glaucoma, bilateral, severe stage: Secondary | ICD-10-CM | POA: Diagnosis not present

## 2019-05-09 DIAGNOSIS — E210 Primary hyperparathyroidism: Secondary | ICD-10-CM | POA: Diagnosis not present

## 2019-06-18 DIAGNOSIS — E210 Primary hyperparathyroidism: Secondary | ICD-10-CM | POA: Diagnosis not present

## 2019-06-18 DIAGNOSIS — E892 Postprocedural hypoparathyroidism: Secondary | ICD-10-CM | POA: Diagnosis not present

## 2019-07-31 ENCOUNTER — Other Ambulatory Visit: Payer: Self-pay | Admitting: Cardiology

## 2019-08-29 DIAGNOSIS — H25813 Combined forms of age-related cataract, bilateral: Secondary | ICD-10-CM | POA: Diagnosis not present

## 2019-08-29 DIAGNOSIS — H401133 Primary open-angle glaucoma, bilateral, severe stage: Secondary | ICD-10-CM | POA: Diagnosis not present

## 2019-09-01 ENCOUNTER — Other Ambulatory Visit: Payer: Self-pay | Admitting: *Deleted

## 2019-09-01 DIAGNOSIS — Z79899 Other long term (current) drug therapy: Secondary | ICD-10-CM

## 2019-09-01 DIAGNOSIS — I480 Paroxysmal atrial fibrillation: Secondary | ICD-10-CM

## 2019-09-09 DIAGNOSIS — I480 Paroxysmal atrial fibrillation: Secondary | ICD-10-CM | POA: Diagnosis not present

## 2019-09-09 DIAGNOSIS — Z79899 Other long term (current) drug therapy: Secondary | ICD-10-CM | POA: Diagnosis not present

## 2019-09-10 ENCOUNTER — Encounter: Payer: Self-pay | Admitting: Cardiology

## 2019-09-10 ENCOUNTER — Ambulatory Visit (HOSPITAL_COMMUNITY)
Admission: RE | Admit: 2019-09-10 | Discharge: 2019-09-10 | Disposition: A | Discharge status: Home / Self Care | Payer: Medicare PPO | Source: Ambulatory Visit | Attending: Cardiology | Admitting: Cardiology

## 2019-09-10 ENCOUNTER — Other Ambulatory Visit: Payer: Self-pay

## 2019-09-10 ENCOUNTER — Other Ambulatory Visit: Payer: Self-pay | Admitting: *Deleted

## 2019-09-10 ENCOUNTER — Ambulatory Visit (INDEPENDENT_AMBULATORY_CARE_PROVIDER_SITE_OTHER): Payer: Medicare PPO | Admitting: Cardiology

## 2019-09-10 VITALS — BP 116/62 | HR 65 | Ht 63.5 in | Wt 280.0 lb

## 2019-09-10 DIAGNOSIS — I480 Paroxysmal atrial fibrillation: Secondary | ICD-10-CM

## 2019-09-10 DIAGNOSIS — R0602 Shortness of breath: Secondary | ICD-10-CM

## 2019-09-10 DIAGNOSIS — I10 Essential (primary) hypertension: Secondary | ICD-10-CM

## 2019-09-10 DIAGNOSIS — I5031 Acute diastolic (congestive) heart failure: Secondary | ICD-10-CM | POA: Diagnosis not present

## 2019-09-10 LAB — CBC
Hematocrit: 41.3 % (ref 34.0–46.6)
Hemoglobin: 13.2 g/dL (ref 11.1–15.9)
MCH: 31.5 pg (ref 26.6–33.0)
MCHC: 32 g/dL (ref 31.5–35.7)
MCV: 99 fL — ABNORMAL HIGH (ref 79–97)
Platelets: 153 10*3/uL (ref 150–450)
RBC: 4.19 x10E6/uL (ref 3.77–5.28)
RDW: 13.1 % (ref 11.7–15.4)
WBC: 5.5 10*3/uL (ref 3.4–10.8)

## 2019-09-10 MED ORDER — FUROSEMIDE 20 MG PO TABS: 40.0000 mg | ORAL_TABLET | Freq: Every day | ORAL | 2 refills | Status: DC

## 2019-09-10 NOTE — Progress Notes (Signed)
Cardiology Office Note  Date: 09/10/2019   ID: Kim Dixon, DOB 1940-04-04, MRN [id number]  PCP:  Asencion Noble, MD  Cardiologist:  Rozann Lesches, MD Electrophysiologist:  None   Chief Complaint  Patient presents with  . Cardiac follow-up    History of Present Illness: Kim Dixon is a 79 y.o. female last seen in December 2020. She presents today for a follow-up visit. She states that she has been much more short of breath in the last few months. She experienced a period of time when her legs swelled significantly, improving recently. She has not changed her diuretics, she is only taking Lasix 10 mg daily. Her weight is up approximately 10 pounds over baseline. She has also been working in her [place] more regularly, was thinking maybe that the dust was causing some trouble. Occasionally feels wheezing, cough in the morning. No sense of palpitations however and her heart rate is regular today.  She is due for follow-up lab work on [employer]. She does not report any bleeding problems.  I personally reviewed her ECG today which shows normal sinus rhythm.  Past Medical History:  Diagnosis Date  . Anxiety   . Arthritis   . Depression   . Diverticulosis   . Essential hypertension   . Glaucoma   . Hematuria   . History of kidney stones   . History of pneumonia   . Hyperlipidemia   . PAF (paroxysmal atrial fibrillation) ([place])   . Pneumonia   . Stress incontinence   . TIA (transient ischemic attack)    January 2020  . Urinary frequency   . Varicose veins     Past Surgical History:  Procedure Laterality Date  . ABDOMINAL HYSTERECTOMY    . ABLATION SAPHENOUS VEIN W/ RFA Bilateral   . COLONOSCOPY    . COLONOSCOPY N/A 06/18/2013   Procedure: COLONOSCOPY;  Surgeon: Rogene Houston, MD;  [facility];  Service: Endoscopy;  Laterality: N/A;  830  . CYSTOSCOPY WITH RETROGRADE PYELOGRAM, URETEROSCOPY AND STENT PLACEMENT Left 10/18/2018   Procedure:  CYSTOSCOPY WITH RETROGRADE PYELOGRAM, URETEROSCOPY AND STENT PLACEMENT;  Surgeon: Alexis Frock, MD;  [facility];  Service: Urology;  Laterality: Left;  1 HR  . EYE SURGERY     laser surgery bilat   . HOLMIUM LASER APPLICATION Left 7/49/4496   Procedure: HOLMIUM LASER APPLICATION;  Surgeon: Alexis Frock, MD;  [facility];  Service: Urology;  Laterality: Left;  . LITHOTRIPSY    . MASS EXCISION N/A 12/07/2017   Procedure: EXCISION 3CM CYST ON BACK;  Surgeon: Aviva Signs, MD;  Location: [facility];  Service: General;  Laterality: N/A;  . PARATHYROIDECTOMY Left 04/22/2019   Procedure: LEFT INFERIOR PARATHYROIDECTOMY;  Surgeon: Armandina Gemma, MD;  [facility];  Service: General;  Laterality: Left;  . TONSILLECTOMY    . TOTAL KNEE ARTHROPLASTY Left 08/25/2014   Procedure: LEFT TOTAL KNEE ARTHROPLASTY;  Surgeon: Paralee Cancel, MD;  [facility];  Service: Orthopedics;  Laterality: Left;    Current Outpatient Medications  Medication Sig Dispense Refill  . Ascorbic Acid (VITAMIN C) 1000 MG tablet Take 3,000 mg by mouth daily.     Marland Kitchen atorvastatin (LIPITOR) 10 MG tablet Take 10 mg by mouth every Monday, Wednesday, and Friday at 8 PM.     . bimatoprost (LUMIGAN) 0.01 % SOLN Place 1 drop into both eyes at bedtime.    . citalopram (CELEXA) 20 MG tablet Take 20 mg by mouth daily.     Marland Kitchen  ELIQUIS 5 MG TABS tablet TAKE ONE TABLET (5MG  TOTAL) BY MOUTH TWICE DAILY 60 tablet 11  . furosemide (LASIX) 20 MG tablet Take 2 tablets (40 mg total) by mouth daily. 60 tablet 2  . losartan (COZAAR) 100 MG tablet Take 100 mg by mouth daily.    . Menthol, Topical Analgesic, (BIOFREEZE ROLL-ON EX) Apply 1 application topically 3 (three) times daily as needed (back pain.).    Marland Kitchen timolol (TIMOPTIC) 0.5 % ophthalmic solution Place 1 drop into both eyes daily.    . traMADol (ULTRAM) 50 MG tablet Take 1-2 tablets (50-100 mg total) by mouth every 6 (six) hours as needed. 15 tablet 0  . Vitamin D3 (VITAMIN D) 25 MCG  tablet Take 1,000 Units by mouth daily.     No current facility-administered medications for this visit.   Allergies:  Naproxen sodium   Social History: The patient  reports that she quit smoking about 26 years ago. Her smoking use included cigarettes. She has a 90.00 pack-year smoking history. She has never used smokeless tobacco. She reports current alcohol use. She reports that she does not use drugs.   Family History: The patient's family history includes Coronary artery disease in her mother; Lung cancer in her father.   ROS:   No palpitations or syncope.  Physical Exam: VS:  BP 116/62   Pulse 65   Ht 5' 3.5" (1.613 m)   Wt 280 lb (127 kg)   SpO2 94%   BMI 48.82 kg/m , BMI Body mass index is 48.82 kg/m.  Wt Readings from Last 3 Encounters:  09/10/19 280 lb (127 kg)  04/22/19 269 lb (122 kg)  04/18/19 269 lb (122 kg)    General: Patient in no distress, mildly short of breath when speaking. HEENT: Conjunctiva and lids normal, wearing a mask. Neck: Supple, difficult to assess JVP, no thyromegaly. Lungs: Decreased breath sounds without wheezing. Cardiac: Regular rate and rhythm, no S3 or significant systolic murmur. Abdomen: Protuberant bowel sounds present. Extremities: 1-2+ lower leg. Skin: Warm and dry. Musculoskeletal: No kyphosis. Neuropsychiatric: Alert and oriented x3, affect grossly appropriate.  ECG:  An ECG dated 03/17/2019 was personally reviewed today and demonstrated:  Normal sinus rhythm.  Recent Labwork: 04/18/2019: BUN 23; Creatinine, Ser 0.81; Hemoglobin 13.6; Platelets 184; Potassium 4.0; Sodium 140   Other Studies Reviewed Today:  Cardiac catheterization 01/01/2009: Normal left main, normal LAD, minimal luminal irregularities within the circumflex, normal RCA. LVEF 60% by ventriculography. Mean RV pressure 11 mmHg. Pulmonary artery systolic pressure 42 mmHg. Pulmonary capillary wedge pressure 20 mmHg. LVEDP 24 mmHg. Cardiac output and index of  6.2 and 2.7 respectively.  Event recorder6/06/2018: 14-day event recorder reviewed. Heart rate ranged from 46 bpm up to 109 bpm with average heart rate 63 bpm while in sinus rhythm. Atrial fibrillation was documented on May 5th, 6-hour episode that began at 5:03 AM. Episode of junctional rhythm with bradycardia noted at 1:34 PM on May 5th as well. There were no other sustained arrhythmias, brief bursts of SVT and a brief episode of NSVT. No pauses.  Echocardiogram 07/24/2018: 1. The left ventricle has normal systolic function with an ejection fraction of 60-65%. The cavity size was normal. Left ventricular diastolic parameters were normal. 2. The right ventricle has normal systolic function. The cavity was normal. There is no increase in right ventricular wall thickness. Right ventricular systolic pressure normal with an estimated pressure of 18.5 mmHg. 3. The aortic valve is tricuspid. Mild aortic annular calcification noted. 4. The  mitral valve is grossly normal. There is moderate to severe mitral annular calcification present. There is mild mitral regurgitation. 5. The tricuspid valve is grossly normal. 6. The aortic root is normal in size and structure.  Assessment and Plan:  1. Progressive shortness of breath, NYHA class III, weight gain suggests possibility of fluid overload and acute diastolic heart failure. LVEF was 60 to 65% by echocardiogram last year with normal diastolic function however and normal RV contraction. She is not in atrial fibrillation today. Plan is to obtain a PA and lateral chest x-ray, follow-up echocardiogram, also increase Lasix to 40 mg daily. We will bring her back to the office in 1 week for reevaluation with CBC and BMET.  2. Paroxysmal atrial fibrillation. CHA2DS2-VASc score is 6. She remains on Eliquis, not on AV nodal blockers. No recent palpitations reported. She is in sinus rhythm today.  3. Essential hypertension, blood pressure is well controlled  today.  Medication Adjustments/Labs and Tests Ordered: Current medicines are reviewed at length with the patient today.  Concerns regarding medicines are outlined above.   Tests Ordered: Orders Placed This Encounter  Procedures  . DG Chest 2 View  . Basic metabolic panel  . EKG 12-Lead  . ECHOCARDIOGRAM COMPLETE    Medication Changes: Meds ordered this encounter  Medications  . furosemide (LASIX) 20 MG tablet    Sig: Take 2 tablets (40 mg total) by mouth daily.    Dispense:  60 tablet    Refill:  2    09/10/2019 dose increase    Disposition:  Follow up next week in the [place] office for reevaluation.  Signed, Satira Sark, MD, [facility] 09/10/2019 10:57 AM    [place] [facility] [place], [place],  [address] Phone: [phone]; Fax: [phone]

## 2019-09-10 NOTE — Patient Instructions (Addendum)
Medication Instructions:   Your physician has recommended you make the following change in your medication:   Increase furosemide to 40 mg by mouth daily  Continue other medications the same  Labwork:  Your physician recommends that you return for lab work in: next week just before your next visit to check your BMET.  Testing/Procedures: Your physician has requested that you have an echocardiogram. Echocardiography is a painless test that uses sound waves to create images of your heart. It provides your doctor with information about the size and shape of your heart and how well your heart's chambers and valves are working. This procedure takes approximately one hour. There are no restrictions for this procedure.  A chest x-ray takes a picture of the organs and structures inside the chest, including the heart, lungs, and blood vessels. This test can show several things, including, whether the heart is enlarges; whether fluid is building up in the lungs; and whether pacemaker / defibrillator leads are still in place.  Follow-Up:  Your physician recommends that you schedule a follow-up appointment in: next week with Katina Dung NP.  Any Other Special Instructions Will Be Listed Below (If Applicable).  If you need a refill on your cardiac medications before your next appointment, please call your pharmacy.

## 2019-09-12 ENCOUNTER — Telehealth: Payer: Self-pay | Admitting: *Deleted

## 2019-09-12 NOTE — Telephone Encounter (Signed)
-----   Message from Satira Sark, MD sent at 09/11/2019  4:18 PM EDT ----- Results reviewed.  Please let her know chest x-ray looks good, no evidence of infection, no pleural effusion.  Continue with current plan and follow-up.

## 2019-09-12 NOTE — Telephone Encounter (Signed)
Patient returned call to Lydia. 

## 2019-09-12 NOTE — Telephone Encounter (Signed)
-----   Message from Satira Sark, MD sent at 09/10/2019  2:11 PM EDT ----- Results reviewed.  Hemoglobin normal.

## 2019-09-12 NOTE — Telephone Encounter (Signed)
Patient informed. Copy sent to PCP °

## 2019-09-18 ENCOUNTER — Other Ambulatory Visit: Payer: Self-pay

## 2019-09-18 ENCOUNTER — Emergency Department (HOSPITAL_COMMUNITY)
Admission: EM | Admit: 2019-09-18 | Discharge: 2019-09-18 | Disposition: A | Discharge status: Home / Self Care | Payer: Medicare PPO | Attending: Emergency Medicine | Admitting: Emergency Medicine

## 2019-09-18 ENCOUNTER — Ambulatory Visit (HOSPITAL_COMMUNITY)
Admission: RE | Admit: 2019-09-18 | Discharge: 2019-09-18 | Disposition: A | Discharge status: Home / Self Care | Payer: Medicare PPO | Source: Ambulatory Visit | Attending: Cardiology | Admitting: Cardiology

## 2019-09-18 ENCOUNTER — Encounter (HOSPITAL_COMMUNITY): Payer: Self-pay

## 2019-09-18 ENCOUNTER — Other Ambulatory Visit (INDEPENDENT_AMBULATORY_CARE_PROVIDER_SITE_OTHER): Payer: Medicare PPO | Admitting: *Deleted

## 2019-09-18 DIAGNOSIS — R609 Edema, unspecified: Secondary | ICD-10-CM | POA: Insufficient documentation

## 2019-09-18 DIAGNOSIS — Z7901 Long term (current) use of anticoagulants: Secondary | ICD-10-CM | POA: Insufficient documentation

## 2019-09-18 DIAGNOSIS — R5383 Other fatigue: Secondary | ICD-10-CM | POA: Diagnosis not present

## 2019-09-18 DIAGNOSIS — Z79899 Other long term (current) drug therapy: Secondary | ICD-10-CM | POA: Insufficient documentation

## 2019-09-18 DIAGNOSIS — Z96652 Presence of left artificial knee joint: Secondary | ICD-10-CM | POA: Diagnosis not present

## 2019-09-18 DIAGNOSIS — I480 Paroxysmal atrial fibrillation: Secondary | ICD-10-CM

## 2019-09-18 DIAGNOSIS — I10 Essential (primary) hypertension: Secondary | ICD-10-CM | POA: Insufficient documentation

## 2019-09-18 DIAGNOSIS — R0602 Shortness of breath: Secondary | ICD-10-CM | POA: Insufficient documentation

## 2019-09-18 DIAGNOSIS — Z8673 Personal history of transient ischemic attack (TIA), and cerebral infarction without residual deficits: Secondary | ICD-10-CM | POA: Insufficient documentation

## 2019-09-18 DIAGNOSIS — Z87891 Personal history of nicotine dependence: Secondary | ICD-10-CM | POA: Diagnosis not present

## 2019-09-18 DIAGNOSIS — I4891 Unspecified atrial fibrillation: Secondary | ICD-10-CM | POA: Diagnosis not present

## 2019-09-18 LAB — CBC WITH DIFFERENTIAL/PLATELET
Abs Immature Granulocytes: 0.01 10*3/uL (ref 0.00–0.07)
Basophils Absolute: 0.1 10*3/uL (ref 0.0–0.1)
Basophils Relative: 1 %
Eosinophils Absolute: 0.2 10*3/uL (ref 0.0–0.5)
Eosinophils Relative: 3 %
HCT: 46.8 % — ABNORMAL HIGH (ref 36.0–46.0)
Hemoglobin: 14.9 g/dL (ref 12.0–15.0)
Immature Granulocytes: 0 %
Lymphocytes Relative: 18 %
Lymphs Abs: 1.3 10*3/uL (ref 0.7–4.0)
MCH: 31.6 pg (ref 26.0–34.0)
MCHC: 31.8 g/dL (ref 30.0–36.0)
MCV: 99.2 fL (ref 80.0–100.0)
Monocytes Absolute: 0.7 10*3/uL (ref 0.1–1.0)
Monocytes Relative: 9 %
Neutro Abs: 4.9 10*3/uL (ref 1.7–7.7)
Neutrophils Relative %: 69 %
Platelets: 169 10*3/uL (ref 150–400)
RBC: 4.72 MIL/uL (ref 3.87–5.11)
RDW: 13.9 % (ref 11.5–15.5)
WBC: 7.1 10*3/uL (ref 4.0–10.5)
nRBC: 0 % (ref 0.0–0.2)

## 2019-09-18 LAB — TROPONIN I (HIGH SENSITIVITY)
Troponin I (High Sensitivity): 9 ng/L (ref <18)
Troponin I (High Sensitivity): 7 ng/L (ref <18)

## 2019-09-18 LAB — BASIC METABOLIC PANEL
Anion gap: 13 (ref 5–15)
BUN: 28 mg/dL — ABNORMAL HIGH (ref 8–23)
CO2: 31 mmol/L (ref 22–32)
Calcium: 10.2 mg/dL (ref 8.9–10.3)
Chloride: 95 mmol/L — ABNORMAL LOW (ref 98–111)
Creatinine, Ser: 0.89 mg/dL (ref 0.44–1.00)
GFR calc Af Amer: >60 mL/min (ref >60)
GFR calc non Af Amer: >60 mL/min (ref >60)
Glucose, Bld: 162 mg/dL — ABNORMAL HIGH (ref 70–99)
Potassium: 3.6 mmol/L (ref 3.5–5.1)
Sodium: 139 mmol/L (ref 135–145)

## 2019-09-18 MED ORDER — METOPROLOL TARTRATE 37.5 MG PO TABS: 37.5000 mg | ORAL_TABLET | Freq: Two times a day (BID) | ORAL | 0 refills | Status: DC

## 2019-09-18 MED ORDER — DILTIAZEM HCL 25 MG/5ML IV SOLN
10.0000 mg | Freq: Once | INTRAVENOUS | Status: AC
  Administered 2019-09-18: 10 mg via INTRAVENOUS
  Filled 2019-09-18: qty 5

## 2019-09-18 MED ORDER — METOPROLOL TARTRATE 37.5 MG PO TABS: 25.0000 mg | ORAL_TABLET | Freq: Two times a day (BID) | ORAL | 0 refills | Status: DC

## 2019-09-18 MED ORDER — LOSARTAN POTASSIUM 50 MG PO TABS: 50.0000 mg | ORAL_TABLET | Freq: Every day | ORAL | 0 refills | Status: DC

## 2019-09-18 MED ORDER — DILTIAZEM HCL 25 MG/5ML IV SOLN
5.0000 mg | Freq: Once | INTRAVENOUS | Status: AC
  Administered 2019-09-18: 5 mg via INTRAVENOUS
  Filled 2019-09-18: qty 5

## 2019-09-18 MED ORDER — METOPROLOL TARTRATE 5 MG/5ML IV SOLN
2.5000 mg | Freq: Once | INTRAVENOUS | Status: AC
  Administered 2019-09-18: 2.5 mg via INTRAVENOUS
  Filled 2019-09-18: qty 5

## 2019-09-18 NOTE — Progress Notes (Signed)
Patient discussed with ER staff, presented from echo with afib with RVR. She has prior history of PAF, has been on eliquis, from notes has not required av nodal agents. Rates improved with IV lopressor and dilt in Er, patient has been asymptomatic the entire time. Recommend lowering her losartan to 50mg  daily, starting lopressor 37.5mg  bid, continuing eliquis. She has close f/u this week in cards clinic, can further titrate lopressor as tolerated. If remains in afib over multiple close follow ups could consider outpatient cardioversion   Zandra Abts MD

## 2019-09-18 NOTE — ED Triage Notes (Signed)
Pt was getting an Echo this morning and per tech, her HR was 115-170. Pt's cardiologist is Dr. Domenic Polite. She has an EKG in the office on Friday and it was NSR. Pt has been having some increased SOB with exertion.

## 2019-09-18 NOTE — Discharge Instructions (Signed)
We have since made some medication changes at your cardiologist recommendation to better control your heart rate without lowering your blood pressure.  Your Cozaar has been reduced to a 50 mg tablet and we have added metoprolol twice daily which will better control your heart rate.  Plan to see your cardiologist tomorrow as planned.

## 2019-09-18 NOTE — ED Provider Notes (Signed)
[facility] Provider Note   CSN: [id number] Arrival date & time: 09/18/19  1013     History Chief Complaint  Patient presents with  . Tachycardia    Kim Dixon is a 79 y.o. female with a history significant for hypertension, hyperlipidemia, paroxysmal atrial fibrillation and hyperparathyroidism presenting with paroxysmal atrial fibrillation exacerbation.  She was seen by Dr. Domenic Polite her cardiologist 1 week ago with complaints of increasing shortness of breath over the past several months along with increased peripheral edema and weight gain.  At that time she had her Lasix increased from 10 mg daily to 40 mg daily and has noticed improvement in the shortness of breath and edema.  She presented this morning for an outpatient echocardiogram at which time it was determined she was then proximal A. fib with RVR.  She does not have symptoms to tell her she was in A. fib, states she has actually been feeling better this week since having the Lasix increased.  She denies chest pain.  She does however have fatigue which she has been chalking up to increased weight.  She has not missed any of her medication doses including her Eliquis which she takes twice daily.  The history is provided by the patient.       Past Medical History:  Diagnosis Date  . Anxiety   . Arthritis   . Depression   . Diverticulosis   . Essential hypertension   . Glaucoma   . Hematuria   . History of kidney stones   . History of pneumonia   . Hyperlipidemia   . PAF (paroxysmal atrial fibrillation) ([place])   . Pneumonia   . Stress incontinence   . TIA (transient ischemic attack)    January 2020  . Urinary frequency   . Varicose veins     Patient Active Problem List   Diagnosis Date Noted  . Hyperparathyroidism, primary ([place]) 04/13/2019  . TIA (transient ischemic attack) 06/04/2018  . Sebaceous cyst   . Obese 08/26/2014  . S/P left TKA 08/25/2014  . S/P knee replacement 08/25/2014   . Varicose veins of leg with complications 40/34/7425  . FATIGUE / MALAISE 12/29/2008  . DYSPNEA 12/29/2008  . ABNORMAL CV (STRESS) TEST 12/29/2008  . DEPRESSION 12/21/2008  . HYPERTENSION 12/21/2008    Past Surgical History:  Procedure Laterality Date  . ABDOMINAL HYSTERECTOMY    . ABLATION SAPHENOUS VEIN W/ RFA Bilateral   . COLONOSCOPY    . COLONOSCOPY N/A 06/18/2013   Procedure: COLONOSCOPY;  Surgeon: Rogene Houston, MD;  [facility];  Service: Endoscopy;  Laterality: N/A;  830  . CYSTOSCOPY WITH RETROGRADE PYELOGRAM, URETEROSCOPY AND STENT PLACEMENT Left 10/18/2018   Procedure: CYSTOSCOPY WITH RETROGRADE PYELOGRAM, URETEROSCOPY AND STENT PLACEMENT;  Surgeon: Alexis Frock, MD;  [facility]: [facility];  Service: Urology;  Laterality: Left;  1 HR  . EYE SURGERY     laser surgery bilat   . HOLMIUM LASER APPLICATION Left 9/56/3875   Procedure: HOLMIUM LASER APPLICATION;  Surgeon: Alexis Frock, MD;  Location: [facility];  Service: Urology;  Laterality: Left;  . LITHOTRIPSY    . MASS EXCISION N/A 12/07/2017   Procedure: EXCISION 3CM CYST ON BACK;  Surgeon: Aviva Signs, MD;  Location: [facility];  Service: General;  Laterality: N/A;  . PARATHYROIDECTOMY Left 04/22/2019   Procedure: LEFT INFERIOR PARATHYROIDECTOMY;  Surgeon: Armandina Gemma, MD;  [facility];  Service: General;  Laterality: Left;  . TONSILLECTOMY    .  TOTAL KNEE ARTHROPLASTY Left 08/25/2014   Procedure: LEFT TOTAL KNEE ARTHROPLASTY;  Surgeon: Paralee Cancel, MD;  [facility];  Service: Orthopedics;  Laterality: Left;     OB History   No obstetric history on file.     Family History  Problem Relation Age of Onset  . Coronary artery disease Mother   . Lung cancer Father   . Colon cancer Neg Hx   . Stomach cancer Neg Hx     Social History   Tobacco Use  . Smoking status: Former Smoker    Packs/day: 3.00    Years: 30.00    Pack years: 90.00    Types: Cigarettes    Quit date: 04/04/1993    Years since  quitting: 26.4  . Smokeless tobacco: Never Used  Vaping Use  . Vaping Use: Never used  Substance Use Topics  . Alcohol use: Yes    Comment: Nightly glass of wine  . Drug use: No    Home Medications Prior to Admission medications   Medication Sig Start Date End Date Taking? Authorizing Provider  Vitamin D, Ergocalciferol, (DRISDOL) 1.25 MG (50000 UNIT) CAPS capsule Take 50,000 Units by mouth once a week. 08/29/19  Yes [provider]  Ascorbic Acid (VITAMIN C) 1000 MG tablet Take 3,000 mg by mouth daily.     [provider]  atorvastatin (LIPITOR) 10 MG tablet Take 10 mg by mouth every Monday, Wednesday, and Friday at 8 PM.     [provider]  bimatoprost (LUMIGAN) 0.01 % SOLN Place 1 drop into both eyes at bedtime.    [provider]  citalopram (CELEXA) 20 MG tablet Take 20 mg by mouth daily.     [provider]  ELIQUIS 5 MG TABS tablet TAKE ONE TABLET (5MG  TOTAL) BY MOUTH TWICE DAILY Patient taking differently: Take 5 mg by mouth 2 (two) times daily.  07/31/19   Satira Sark, MD  furosemide (LASIX) 20 MG tablet Take 2 tablets (40 mg total) by mouth daily. 09/10/19   Satira Sark, MD  losartan (COZAAR) 50 MG tablet Take 1 tablet (50 mg total) by mouth daily. 09/18/19   Evalee Jefferson, PA-C  Menthol, Topical Analgesic, (BIOFREEZE ROLL-ON EX) Apply 1 application topically 3 (three) times daily as needed (back pain.).    [provider]  Metoprolol Tartrate 37.5 MG TABS Take 37.5 mg by mouth 2 (two) times daily. 09/18/19   Evalee Jefferson, PA-C  timolol (TIMOPTIC) 0.5 % ophthalmic solution Place 1 drop into both eyes daily. 10/23/17   [provider]  Vitamin D3 (VITAMIN D) 25 MCG tablet Take 1,000 Units by mouth daily.    [provider]    Allergies    Naproxen sodium  Review of Systems   Review of Systems  Constitutional: Positive for fatigue. Negative for fever.  HENT: Negative for congestion and sore throat.     Eyes: Negative.   Respiratory: Positive for shortness of breath. Negative for chest tightness.        But improving per hpi  Cardiovascular: Positive for palpitations. Negative for chest pain and leg swelling.  Gastrointestinal: Negative for abdominal pain and nausea.  Genitourinary: Negative.   Musculoskeletal: Negative for arthralgias, joint swelling and neck pain.  Skin: Negative.  Negative for rash and wound.  Neurological: Negative for dizziness, weakness, light-headedness, numbness and headaches.  Psychiatric/Behavioral: Negative.     Physical Exam Updated Vital Signs BP (!) 104/92   Pulse 86   Temp 98.7  F (37.1 C) (Oral)   Resp 12   Ht 5\' 3"  (1.6 m)   Wt 127 kg   SpO2 91%   BMI 49.60 kg/m   Physical Exam Vitals and nursing note reviewed.  Constitutional:      General: She is not in acute distress.    Appearance: She is well-developed.  HENT:     Head: Normocephalic and atraumatic.  Eyes:     Conjunctiva/sclera: Conjunctivae normal.  Cardiovascular:     Rate and Rhythm: Tachycardia present. Rhythm irregular.     Heart sounds: Normal heart sounds.  Pulmonary:     Effort: Pulmonary effort is normal.     Breath sounds: Normal breath sounds. No wheezing.  Abdominal:     General: Bowel sounds are normal.     Palpations: Abdomen is soft.     Tenderness: There is no abdominal tenderness.  Musculoskeletal:        General: Normal range of motion.     Cervical back: Normal range of motion.     Comments: Trace ankle edema.  Skin:    General: Skin is warm and dry.  Neurological:     Mental Status: She is alert.     ED Results / Procedures / Treatments   Labs (all labs ordered are listed, but only abnormal results are displayed) Labs Reviewed  CBC WITH DIFFERENTIAL/PLATELET - Abnormal; Notable for the following components:      Result Value   HCT 46.8 (*)    All other components within normal limits  BASIC METABOLIC PANEL - Abnormal; Notable for the  following components:   Chloride 95 (*)    Glucose, Bld 162 (*)    BUN 28 (*)    All other components within normal limits  TROPONIN I (HIGH SENSITIVITY)  TROPONIN I (HIGH SENSITIVITY)    EKG EKG Interpretation  Date/Time:  Thursday September 18 2019 10:21:45 EDT Ventricular Rate:  164 PR Interval:    QRS Duration: 133 QT Interval:  291 QTC Calculation: 510 R Axis:   111 Text Interpretation: atrial fibrillation with RVR RBBB and LPFB Confirmed by Sherwood Gambler [phone]) on 09/18/2019 10:29:27 AM   Radiology No results found.  Procedures Procedures (including critical care time)  Medications Ordered in ED Medications  metoprolol tartrate (LOPRESSOR) injection 2.5 mg (2.5 mg Intravenous Given 09/18/19 1108)  diltiazem (CARDIZEM) injection 5 mg (5 mg Intravenous Given 09/18/19 1219)  diltiazem (CARDIZEM) injection 10 mg (10 mg Intravenous Given 09/18/19 1233)    ED Course  I have reviewed the triage vital signs and the nursing notes.  Pertinent labs & imaging results that were available during my care of the patient were reviewed by me and considered in my medical decision making (see chart for details).    MDM Rules/Calculators/A&P                          Pt presenting with afib with rvr with history of paroxysmal a fib.  Asymptomatic, found incidentally with scheduled outpatient echocardiogram this am.  She was given IV metoprolol and diltiazem with improvement in rate, still asymptomatic.  Discussed with Dr. Harl Bowie re additional tests or medication changes.  She is not on a rate reducer - recommended adding metoprolol 37.5 mg bid and decreasing the cozaar to 50 mg qd.  Discussed these changes with pt who understands the plan.  She may split her cozaar 100 mg tabs as she just had this filled.  Pt has  appt with cardiology tomorrow - advised to keep this appt.   Pt was seen by Dr Regenia Skeeter during this ed visit. Final Clinical Impression(s) / ED Diagnoses Final diagnoses:  Atrial  fibrillation with RVR ([place])    Rx / DC Orders ED Discharge Orders         Ordered    metoprolol tartrate 37.5 MG TABS  2 times daily,   Status:  Discontinued     Reprint     09/18/19 1355    losartan (COZAAR) 50 MG tablet  Daily     Discontinue  Reprint     09/18/19 1355    Metoprolol Tartrate 37.5 MG TABS  2 times daily     Discontinue  Reprint     09/18/19 [place], Aswad Wandrey, Hershal Coria 09/18/19 2014    Sherwood Gambler, MD 09/20/19 1105

## 2019-09-18 NOTE — Progress Notes (Signed)
Cardiology Office Note  Date: 09/19/2019   ID: MAAYAN Dixon, DOB 1941/01/26, MRN [id number]  PCP:  Asencion Noble, MD  Cardiologist:  Rozann Lesches, MD Electrophysiologist:  None   Chief Complaint: F/U Acute diastolic HF  History of Present Illness: Kim Dixon is a 79 y.o. female with a history of diastolic HF, PAF, HTN.  Last saw Dr. Domenic Polite on 09/10/2019 stating she had been much more short of breath in the last few months.  Had a period of time when her legs swelled significantly, improving recently.  She had not changed her diuretics.  She was only taking Lasix 10 mg.  Her weight was up approximately 10 pounds over her baseline.  Had NYHA class III, weight gain suggesting possibility of fluid overload and acute diastolic heart failure.  LVEF was 60 to 65% by echo last year with normal diastolic function tests.  PA and lateral chest was ordered.  Follow-up echocardiogram was ordered.  Lasix was increased to 40 mg daily.  In regard to her PAF.  Her CHA2DS2-VASc score was 6.  She remained on Eliquis not on AV nodal blockers.  No recent palpitations.  She was in sinus rhythm at last visit.  Blood pressure was well controlled.  Chest x-ray showed no active cardiopulmonary disease on 09/11/2019.  Echocardiogram report was pending on 09/18/2019.  She is here today for follow-up with no particular complaints.  She was initially scheduled for an echocardiogram but it had to be postponed due to the patient being in rapid atrial fibrillation.  She was taken to the emergency room and treated.  She was discharged on metoprolol 37.5 mg p.o. twice daily.  Her losartan was decreased to 50 mg.  Heart rate today is 53.  Blood pressure 110/68.  States he is feeling much better since her heart rate has been controlled.  She denies any anginal or exertional symptoms, palpitations or arrhythmias, orthostatic symptoms, increased lower extremity edema.  It appears she has lost approximately 7 pounds since  last visit on 09/18/2019.  Pulse regular at 53 bpm today.   Past Medical History:  Diagnosis Date  . Anxiety   . Arthritis   . Depression   . Diverticulosis   . Essential hypertension   . Glaucoma   . Hematuria   . History of kidney stones   . History of pneumonia   . Hyperlipidemia   . PAF (paroxysmal atrial fibrillation) ([place])   . Pneumonia   . Stress incontinence   . TIA (transient ischemic attack)    January 2020  . Urinary frequency   . Varicose veins     Past Surgical History:  Procedure Laterality Date  . ABDOMINAL HYSTERECTOMY    . ABLATION SAPHENOUS VEIN W/ RFA Bilateral   . COLONOSCOPY    . COLONOSCOPY N/A 06/18/2013   Procedure: COLONOSCOPY;  Surgeon: Rogene Houston, MD;  [facility];  Service: Endoscopy;  Laterality: N/A;  830  . CYSTOSCOPY WITH RETROGRADE PYELOGRAM, URETEROSCOPY AND STENT PLACEMENT Left 10/18/2018   Procedure: CYSTOSCOPY WITH RETROGRADE PYELOGRAM, URETEROSCOPY AND STENT PLACEMENT;  Surgeon: Alexis Frock, MD;  [facility];  Service: Urology;  Laterality: Left;  1 HR  . EYE SURGERY     laser surgery bilat   . HOLMIUM LASER APPLICATION Left 10/20/2120   Procedure: HOLMIUM LASER APPLICATION;  Surgeon: Alexis Frock, MD;  [facility];  Service: Urology;  Laterality: Left;  . LITHOTRIPSY    . MASS EXCISION N/A 12/07/2017  Procedure: EXCISION 3CM CYST ON BACK;  Surgeon: Aviva Signs, MD;  Location: [facility];  Service: General;  Laterality: N/A;  . PARATHYROIDECTOMY Left 04/22/2019   Procedure: LEFT INFERIOR PARATHYROIDECTOMY;  Surgeon: Armandina Gemma, MD;  [facility];  Service: General;  Laterality: Left;  . TONSILLECTOMY    . TOTAL KNEE ARTHROPLASTY Left 08/25/2014   Procedure: LEFT TOTAL KNEE ARTHROPLASTY;  Surgeon: Paralee Cancel, MD;  [facility];  Service: Orthopedics;  Laterality: Left;    Current Outpatient Medications  Medication Sig Dispense Refill  . Ascorbic Acid (VITAMIN C) 1000 MG tablet Take 3,000 mg by  mouth daily.     Marland Kitchen atorvastatin (LIPITOR) 10 MG tablet Take 10 mg by mouth every Monday, Wednesday, and Friday at 8 PM.     . bimatoprost (LUMIGAN) 0.01 % SOLN Place 1 drop into both eyes at bedtime.    . citalopram (CELEXA) 20 MG tablet Take 20 mg by mouth daily.     Marland Kitchen ELIQUIS 5 MG TABS tablet TAKE ONE TABLET (5MG  TOTAL) BY MOUTH TWICE DAILY (Patient taking differently: Take 5 mg by mouth 2 (two) times daily. ) 60 tablet 11  . losartan (COZAAR) 50 MG tablet Take 1 tablet (50 mg total) by mouth daily. 30 tablet 0  . Menthol, Topical Analgesic, (BIOFREEZE ROLL-ON EX) Apply 1 application topically 3 (three) times daily as needed (back pain.).    Marland Kitchen Metoprolol Tartrate 37.5 MG TABS Take 37.5 mg by mouth 2 (two) times daily. 60 tablet 0  . timolol (TIMOPTIC) 0.5 % ophthalmic solution Place 1 drop into both eyes daily.    Marland Kitchen torsemide (DEMADEX) 20 MG tablet Take 40 mg by mouth daily.    . Vitamin D, Ergocalciferol, (DRISDOL) 1.25 MG (50000 UNIT) CAPS capsule Take 50,000 Units by mouth once a week.    . Vitamin D3 (VITAMIN D) 25 MCG tablet Take 1,000 Units by mouth daily.     No current facility-administered medications for this visit.   Allergies:  Naproxen sodium   Social History: The patient  reports that she quit smoking about 26 years ago. Her smoking use included cigarettes. She has a 90.00 pack-year smoking history. She has never used smokeless tobacco. She reports current alcohol use. She reports that she does not use drugs.   Family History: The patient's family history includes Coronary artery disease in her mother; Lung cancer in her father.   ROS:  Please see the history of present illness. Otherwise, complete review of systems is positive for none.  All other systems are reviewed and negative.   Physical Exam: VS:  BP 110/68   Pulse (!) 53   Ht 5' 3.5" (1.613 m)   Wt 273 lb (123.8 kg)   SpO2 94%   BMI 47.60 kg/m , BMI Body mass index is 47.6 kg/m.  Wt Readings from Last 3  Encounters:  09/19/19 273 lb (123.8 kg)  09/18/19 280 lb (127 kg)  09/10/19 280 lb (127 kg)    General: Patient appears comfortable at rest. Neck: Supple, no elevated JVP or carotid bruits, no thyromegaly. Lungs: Clear to auscultation, nonlabored breathing at rest. Cardiac: Regular rate and rhythm, no S3 or significant systolic murmur, no pericardial rub. Extremities: No pitting edema, distal pulses 2+. Skin: Warm and dry. Musculoskeletal: No kyphosis. Neuropsychiatric: Alert and oriented x3, affect grossly appropriate.  ECG:  EKG performed on July 1 at 10 AM showed atrial fibrillation rate of 145  Recent Labwork: 09/18/2019: BUN 28; Creatinine, Ser 0.89; Hemoglobin  14.9; Platelets 169; Potassium 3.6; Sodium 139  No results found for: CHOL, TRIG, HDL, CHOLHDL, VLDL, LDLCALC, LDLDIRECT  Other Studies Reviewed Today:  Cardiac catheterization 01/01/2009: Normal left main, normal LAD, minimal luminal irregularities within the circumflex, normal RCA. LVEF 60% by ventriculography. Mean RV pressure 11 mmHg. Pulmonary artery systolic pressure 42 mmHg. Pulmonary capillary wedge pressure 20 mmHg. LVEDP 24 mmHg. Cardiac output and index of 6.2 and 2.7 respectively.  Event recorder6/06/2018: 14-day event recorder reviewed. Heart rate ranged from 46 bpm up to 109 bpm with average heart rate 63 bpm while in sinus rhythm. Atrial fibrillation was documented on May 5th, 6-hour episode that began at 5:03 AM. Episode of junctional rhythm with bradycardia noted at 1:34 PM on May 5th as well. There were no other sustained arrhythmias, brief bursts of SVT and a brief episode of NSVT. No pauses.  Echocardiogram 07/24/2018: 1. The left ventricle has normal systolic function with an ejection fraction of 60-65%. The cavity size was normal. Left ventricular diastolic parameters were normal. 2. The right ventricle has normal systolic function. The cavity was normal. There is no increase in right  ventricular wall thickness. Right ventricular systolic pressure normal with an estimated pressure of 18.5 mmHg. 3. The aortic valve is tricuspid. Mild aortic annular calcification noted. 4. The mitral valve is grossly normal. There is moderate to severe mitral annular calcification present. There is mild mitral regurgitation. 5. The tricuspid valve is grossly normal. 6. The aortic root is normal in size and structure.   Assessment and Plan:  1. SOB (shortness of breath)   2. Paroxysmal atrial fibrillation (H[facility])   3. Essential hypertension     1. SOB (shortness of breath) Denies any shortness of breath today.  Has lost approximately 7 pounds.  She was recently switched to torsemide 40 mg daily.  We will reschedule echocardiogram at [facility] and have her follow-up in 2 months with Dr. Domenic Polite post echocardiogram.  Continue torsemide 40 mg daily.  2. Paroxysmal atrial fibrillation ([place]) Yesterday patient had a scheduled echocardiogram but it was canceled secondary to patient being in atrial fibrillation with RVR with a rate of 145.  Patient states she was taken to the emergency room and treated for atrial fibrillation.  She was discharged on metoprolol 37.5 mg p.o. twice daily.  Currently taking Eliquis 5 mg p.o. twice daily.  3. Essential hypertension Patient is normotensive today.  Her losartan dose was decreased yesterday due to addition of metoprolol and concerned over blood pressure dropping too much as a result.  Current losartan dosage 50 mg daily.  Continue losartan 50 mg daily  Medication Adjustments/Labs and Tests Ordered: Current medicines are reviewed at length with the patient today.  Concerns regarding medicines are outlined above.   Disposition: Follow-up with Dr. Domenic Polite at [facility] in 2 months Signed, Levell July, NP 09/19/2019 9:39 AM    [facility] at [place] [address], [place], [place] [address] Phone: [phone]; Fax:  [phone]

## 2019-09-19 ENCOUNTER — Ambulatory Visit (INDEPENDENT_AMBULATORY_CARE_PROVIDER_SITE_OTHER): Payer: Medicare PPO | Admitting: Family Medicine

## 2019-09-19 ENCOUNTER — Other Ambulatory Visit: Payer: Self-pay

## 2019-09-19 ENCOUNTER — Telehealth: Payer: Self-pay | Admitting: Family Medicine

## 2019-09-19 ENCOUNTER — Encounter: Payer: Self-pay | Admitting: Family Medicine

## 2019-09-19 VITALS — BP 110/68 | HR 53 | Ht 63.5 in | Wt 273.0 lb

## 2019-09-19 DIAGNOSIS — I10 Essential (primary) hypertension: Secondary | ICD-10-CM | POA: Diagnosis not present

## 2019-09-19 DIAGNOSIS — R0602 Shortness of breath: Secondary | ICD-10-CM | POA: Diagnosis not present

## 2019-09-19 DIAGNOSIS — I480 Paroxysmal atrial fibrillation: Secondary | ICD-10-CM | POA: Diagnosis not present

## 2019-09-19 NOTE — Telephone Encounter (Signed)
Pre-cert Verification for the following procedure    ECHO   DATE Sep 30, 2019  Kim Dixon

## 2019-09-19 NOTE — Patient Instructions (Signed)
Your physician recommends that you schedule a follow-up appointment in: 2 [place]  Your physician recommends that you continue on your current medications as directed. Please refer to the Current Medication list given to you today.  Your physician has requested that you have an echocardiogram. Echocardiography is a painless test that uses sound waves to create images of your heart. It provides your doctor with information about the size and shape of your heart and how well your heart's chambers and valves are working. This procedure takes approximately one hour. There are no restrictions for this procedure.  Thank you for choosing [facility]!!

## 2019-09-19 NOTE — Progress Notes (Signed)
Echocardiogram could not be completed due to increased heart rate and rhythm. Spoke with Bernerd Pho, P.A.C. EKG obtained in [facility] office and patient taken to E.D. per instructions.   Alvino Chapel, RCS

## 2019-09-24 ENCOUNTER — Telehealth: Payer: Self-pay | Admitting: *Deleted

## 2019-09-24 NOTE — Telephone Encounter (Signed)
-----   Message from Satira Sark, MD sent at 09/24/2019  8:09 AM EDT ----- Results reviewed.  Renal function and potassium are normal.

## 2019-09-24 NOTE — Telephone Encounter (Signed)
Patient informed. Copy sent to PCP °

## 2019-09-30 ENCOUNTER — Ambulatory Visit (HOSPITAL_COMMUNITY)
Admission: RE | Admit: 2019-09-30 | Discharge: 2019-09-30 | Disposition: A | Discharge status: Home / Self Care | Payer: Medicare PPO | Source: Ambulatory Visit | Attending: Family Medicine | Admitting: Family Medicine

## 2019-09-30 ENCOUNTER — Other Ambulatory Visit: Payer: Self-pay

## 2019-09-30 DIAGNOSIS — I480 Paroxysmal atrial fibrillation: Secondary | ICD-10-CM

## 2019-09-30 NOTE — Progress Notes (Signed)
*  PRELIMINARY RESULTS* Echocardiogram 2D Echocardiogram has been performed.  Kim Dixon 09/30/2019, 11:56 AM

## 2019-10-02 ENCOUNTER — Telehealth: Payer: Self-pay | Admitting: *Deleted

## 2019-10-02 NOTE — Telephone Encounter (Signed)
Patient informed. Copy sent to PCP °

## 2019-10-02 NOTE — Telephone Encounter (Signed)
-----   Message from Verta Ellen., NP sent at 10/01/2019  3:18 PM EDT ----- Please call the patient and tell the echocardiogram showed the pumping function of her heart was good. Very mild valve leaking on the left side of her heart. Everything looks good from the heart structure and function. Tell her trivial to mild leaking of valves is normal as we age.

## 2019-10-06 DIAGNOSIS — R001 Bradycardia, unspecified: Secondary | ICD-10-CM | POA: Diagnosis not present

## 2019-10-06 DIAGNOSIS — Z6841 Body Mass Index (BMI) 40.0 and over, adult: Secondary | ICD-10-CM | POA: Diagnosis not present

## 2019-10-06 DIAGNOSIS — I10 Essential (primary) hypertension: Secondary | ICD-10-CM | POA: Diagnosis not present

## 2019-10-27 ENCOUNTER — Telehealth: Payer: Self-pay | Admitting: Cardiology

## 2019-10-27 ENCOUNTER — Other Ambulatory Visit: Payer: Self-pay | Admitting: *Deleted

## 2019-10-27 MED ORDER — TORSEMIDE 20 MG PO TABS: 40.0000 mg | ORAL_TABLET | Freq: Every day | ORAL | 1 refills | Status: DC

## 2019-10-27 NOTE — Telephone Encounter (Signed)
Pt is on torsemide 40 mg daily - refill was sent today - pt aware

## 2019-10-27 NOTE — Telephone Encounter (Signed)
New message    Patient called she states that Dr Kim Dixon changed her fluid medication from torsemide (DEMADEX) 20 MG tablet ? She doesn't want to take any other medication she does really well with this fluid pill ?  Please call

## 2019-10-28 ENCOUNTER — Telehealth: Payer: Self-pay | Admitting: Dermatology

## 2019-10-28 DIAGNOSIS — R001 Bradycardia, unspecified: Secondary | ICD-10-CM | POA: Diagnosis not present

## 2019-10-28 DIAGNOSIS — R0602 Shortness of breath: Secondary | ICD-10-CM | POA: Diagnosis not present

## 2019-10-28 NOTE — Telephone Encounter (Signed)
Phone referral from Dr Asencion Noble. Established patient

## 2019-11-21 ENCOUNTER — Other Ambulatory Visit: Payer: Self-pay | Admitting: Cardiology

## 2019-11-21 ENCOUNTER — Other Ambulatory Visit: Payer: Self-pay

## 2019-11-21 ENCOUNTER — Ambulatory Visit (INDEPENDENT_AMBULATORY_CARE_PROVIDER_SITE_OTHER): Payer: Medicare PPO | Admitting: Cardiology

## 2019-11-21 ENCOUNTER — Encounter: Payer: Self-pay | Admitting: Cardiology

## 2019-11-21 ENCOUNTER — Other Ambulatory Visit (HOSPITAL_COMMUNITY)
Admission: RE | Admit: 2019-11-21 | Discharge: 2019-11-21 | Disposition: A | Discharge status: Home / Self Care | Payer: Medicare PPO | Source: Ambulatory Visit | Attending: Cardiology | Admitting: Cardiology

## 2019-11-21 VITALS — BP 110/66 | HR 54 | Ht 63.5 in | Wt 276.0 lb

## 2019-11-21 DIAGNOSIS — I480 Paroxysmal atrial fibrillation: Secondary | ICD-10-CM

## 2019-11-21 DIAGNOSIS — I5032 Chronic diastolic (congestive) heart failure: Secondary | ICD-10-CM

## 2019-11-21 DIAGNOSIS — Z01818 Encounter for other preprocedural examination: Secondary | ICD-10-CM

## 2019-11-21 DIAGNOSIS — I200 Unstable angina: Secondary | ICD-10-CM

## 2019-11-21 DIAGNOSIS — I10 Essential (primary) hypertension: Secondary | ICD-10-CM | POA: Diagnosis not present

## 2019-11-21 LAB — CBC WITH DIFFERENTIAL/PLATELET
Abs Immature Granulocytes: 0.02 10*3/uL (ref 0.00–0.07)
Basophils Absolute: 0.1 10*3/uL (ref 0.0–0.1)
Basophils Relative: 1 %
Eosinophils Absolute: 0.2 10*3/uL (ref 0.0–0.5)
Eosinophils Relative: 3 %
HCT: 41.1 % (ref 36.0–46.0)
Hemoglobin: 13 g/dL (ref 12.0–15.0)
Immature Granulocytes: 0 %
Lymphocytes Relative: 20 %
Lymphs Abs: 1.4 10*3/uL (ref 0.7–4.0)
MCH: 31.6 pg (ref 26.0–34.0)
MCHC: 31.6 g/dL (ref 30.0–36.0)
MCV: 99.8 fL (ref 80.0–100.0)
Monocytes Absolute: 0.7 10*3/uL (ref 0.1–1.0)
Monocytes Relative: 10 %
Neutro Abs: 4.8 10*3/uL (ref 1.7–7.7)
Neutrophils Relative %: 66 %
Platelets: 175 10*3/uL (ref 150–400)
RBC: 4.12 MIL/uL (ref 3.87–5.11)
RDW: 14.2 % (ref 11.5–15.5)
WBC: 7.2 10*3/uL (ref 4.0–10.5)
nRBC: 0 % (ref 0.0–0.2)

## 2019-11-21 LAB — BASIC METABOLIC PANEL
Anion gap: 16 — ABNORMAL HIGH (ref 5–15)
BUN: 23 mg/dL (ref 8–23)
CO2: 30 mmol/L (ref 22–32)
Calcium: 9 mg/dL (ref 8.9–10.3)
Chloride: 96 mmol/L — ABNORMAL LOW (ref 98–111)
Creatinine, Ser: 0.85 mg/dL (ref 0.44–1.00)
GFR calc Af Amer: >60 mL/min (ref >60)
GFR calc non Af Amer: >60 mL/min (ref >60)
Glucose, Bld: 138 mg/dL — ABNORMAL HIGH (ref 70–99)
Potassium: 3.9 mmol/L (ref 3.5–5.1)
Sodium: 142 mmol/L (ref 135–145)

## 2019-11-21 MED ORDER — METOPROLOL TARTRATE 25 MG PO TABS
25.0000 mg | ORAL_TABLET | Freq: Two times a day (BID) | ORAL | 3 refills | Status: DC
Start: 2019-11-21 — End: 2020-11-30

## 2019-11-21 MED ORDER — SODIUM CHLORIDE 0.9% FLUSH
3.0000 mL | Freq: Two times a day (BID) | INTRAVENOUS | Status: AC
Start: 2019-11-21 — End: ?

## 2019-11-21 NOTE — Patient Instructions (Signed)
Medication Instructions:  DECREASE lopressor to 25 mg twice a day    *If you need a refill on your cardiac medications before your next appointment, please call your pharmacy*   Lab Work: Cbc,bmet today  If you have labs (blood work) drawn today and your tests are completely normal, you will receive your results only by: Marland Kitchen MyChart Message (if you have MyChart) OR . A paper copy in the mail If you have any lab test that is abnormal or we need to change your treatment, we will call you to review the results.   Testing/Procedures: Your physician has requested that you have a cardiac catheterization. Cardiac catheterization is used to diagnose and/or treat various heart conditions. Doctors may recommend this procedure for a number of different reasons. The most common reason is to evaluate chest pain. Chest pain can be a symptom of coronary artery disease (CAD), and cardiac catheterization can show whether plaque is narrowing or blocking your heart's arteries. This procedure is also used to evaluate the valves, as well as measure the blood flow and oxygen levels in different parts of your heart. For further information please visit [email or web address]. Please follow instruction sheet, as given.     Follow-Up: At [facility], you and your health needs are our priority.  As part of our continuing mission to provide you with exceptional heart care, we have created designated Provider Care Teams.  These Care Teams include your primary Cardiologist (physician) and Advanced Practice Providers (APPs -  Physician Assistants and Nurse Practitioners) who all work together to provide you with the care you need, when you need it.  We recommend signing up for the patient portal called "MyChart".  Sign up information is provided on this After Visit Summary.  MyChart is used to connect with patients for Virtual Visits (Telemedicine).  Patients are able to view lab/test results, encounter notes, upcoming  appointments, etc.  Non-urgent messages can be sent to your provider as well.   To learn more about what you can do with MyChart, go to [email or web address].    Your next appointment:   3 week(s)  The format for your next appointment:   In Person  Provider:   Rozann Lesches, MD, Bernerd Pho, PA-C or Ermalinda Barrios, PA-C   Other Instructions None       Thank you for choosing [facility] !

## 2019-11-21 NOTE — Progress Notes (Signed)
Cardiology Office Note  Date: 11/21/2019   ID: Kim Dixon, DOB December 09, 1940, MRN [id number]  PCP:  Asencion Noble, MD  Cardiologist:  Rozann Lesches, MD Electrophysiologist:  None   Chief Complaint  Patient presents with  . Cardiac follow-up    History of Present Illness: Kim Dixon is a 79 y.o. female last seen in July by Mr. Leonides Sake NP.  She presents for a follow-up visit.  She tells me that over the last month or so she has been experiencing worsening exertional chest tightness and fatigue, has to stop sometimes when walking from her bedroom to her kitchen.  This is despite feeling better in terms of overall fluid status since her diuretics were changed.  Recent echocardiogram in July revealed LVEF 60 to 65% with normal RV contraction, moderately dilated left atrium, no major valvular abnormalities.  She is currently on Demadex 40 mg daily. She has not had follow-up lab work as yet for reassessment of renal function and potassium.  Breakthrough episode of rapid atrial fibrillation noted in early July.  No definite recurrence since that time.  Beta-blocker was advanced but she is bradycardic in sinus rhythm.  She continues on Eliquis for stroke prophylaxis.  I personally reviewed her ECG today which shows sinus bradycardia at 52 bpm with left atrial enlargement and low voltage.  Past Medical History:  Diagnosis Date  . Anxiety   . Arthritis   . Depression   . Diverticulosis   . Essential hypertension   . Glaucoma   . Hematuria   . History of kidney stones   . History of pneumonia   . Hyperlipidemia   . PAF (paroxysmal atrial fibrillation) ([place])   . Pneumonia   . Stress incontinence   . TIA (transient ischemic attack)    January 2020  . Urinary frequency   . Varicose veins     Past Surgical History:  Procedure Laterality Date  . ABDOMINAL HYSTERECTOMY    . ABLATION SAPHENOUS VEIN W/ RFA Bilateral   . COLONOSCOPY    . COLONOSCOPY N/A 06/18/2013    Procedure: COLONOSCOPY;  Surgeon: Rogene Houston, MD;  [facility];  Service: Endoscopy;  Laterality: N/A;  830  . CYSTOSCOPY WITH RETROGRADE PYELOGRAM, URETEROSCOPY AND STENT PLACEMENT Left 10/18/2018   Procedure: CYSTOSCOPY WITH RETROGRADE PYELOGRAM, URETEROSCOPY AND STENT PLACEMENT;  Surgeon: Alexis Frock, MD;  [facility]: [facility];  Service: Urology;  Laterality: Left;  1 HR  . EYE SURGERY     laser surgery bilat   . HOLMIUM LASER APPLICATION Left 1/76/1607   Procedure: HOLMIUM LASER APPLICATION;  Surgeon: Alexis Frock, MD;  [facility];  Service: Urology;  Laterality: Left;  . LITHOTRIPSY    . MASS EXCISION N/A 12/07/2017   Procedure: EXCISION 3CM CYST ON BACK;  Surgeon: Aviva Signs, MD;  Location: [facility];  Service: General;  Laterality: N/A;  . PARATHYROIDECTOMY Left 04/22/2019   Procedure: LEFT INFERIOR PARATHYROIDECTOMY;  Surgeon: Armandina Gemma, MD;  [facility];  Service: General;  Laterality: Left;  . TONSILLECTOMY    . TOTAL KNEE ARTHROPLASTY Left 08/25/2014   Procedure: LEFT TOTAL KNEE ARTHROPLASTY;  Surgeon: Paralee Cancel, MD;  [facility];  Service: Orthopedics;  Laterality: Left;    Current Outpatient Medications  Medication Sig Dispense Refill  . Ascorbic Acid (VITAMIN C) 1000 MG tablet Take 3,000 mg by mouth daily.     Marland Kitchen atorvastatin (LIPITOR) 10 MG tablet Take 10 mg by mouth every Monday, Wednesday, and  Friday at 8 PM.     . bimatoprost (LUMIGAN) 0.01 % SOLN Place 1 drop into both eyes at bedtime.    . citalopram (CELEXA) 20 MG tablet Take 20 mg by mouth daily.     Marland Kitchen ELIQUIS 5 MG TABS tablet TAKE ONE TABLET (5MG  TOTAL) BY MOUTH TWICE DAILY (Patient taking differently: Take 5 mg by mouth 2 (two) times daily. ) 60 tablet 11  . losartan (COZAAR) 50 MG tablet Take 1 tablet (50 mg total) by mouth daily. 30 tablet 0  . Menthol, Topical Analgesic, (BIOFREEZE ROLL-ON EX) Apply 1 application topically 3 (three) times daily as needed (back pain.).    Marland Kitchen  timolol (TIMOPTIC) 0.5 % ophthalmic solution Place 1 drop into both eyes daily.    Marland Kitchen torsemide (DEMADEX) 20 MG tablet Take 2 tablets (40 mg total) by mouth daily. 180 tablet 1  . Vitamin D3 (VITAMIN D) 25 MCG tablet Take 1,000 Units by mouth daily.    . metoprolol tartrate (LOPRESSOR) 25 MG tablet Take 1 tablet (25 mg total) by mouth 2 (two) times daily. 180 tablet 3   No current facility-administered medications for this visit.   Allergies:  Naproxen sodium   Social History: The patient  reports that she quit smoking about 26 years ago. Her smoking use included cigarettes. She has a 90.00 pack-year smoking history. She has never used smokeless tobacco. She reports current alcohol use. She reports that she does not use drugs.   Family History: The patient's family history includes Coronary artery disease in her mother; Lung cancer in her father.   ROS:   Occasional lightheadedness.  Physical Exam: VS:  BP 110/66   Pulse (!) 54   Ht 5' 3.5" (1.613 m)   Wt 276 lb (125.2 kg)   SpO2 93%   BMI 48.12 kg/m , BMI Body mass index is 48.12 kg/m.  Wt Readings from Last 3 Encounters:  11/21/19 276 lb (125.2 kg)  09/19/19 273 lb (123.8 kg)  09/18/19 280 lb (127 kg)    General: Obese woman, appears comfortable at rest. HEENT: Conjunctiva and lids normal, wearing a mask. Neck: Supple, no elevated JVP or carotid bruits, no thyromegaly. Lungs: Clear to auscultation, nonlabored breathing at rest. Cardiac: Regular rate and rhythm, no S3, soft systolic murmur, no pericardial rub. Abdomen: Soft, bowel sounds present, no guarding or rebound. Extremities: Mild ankle edema, distal pulses 2+. Skin: Warm and dry. Musculoskeletal: No kyphosis. Neuropsychiatric: Alert and oriented x3, affect grossly appropriate.  ECG:  An ECG dated 09/18/2019 was personally reviewed today and demonstrated:  Rapid atrial fibrillation, left posterior fascicular block.  Recent Labwork: 09/18/2019: BUN 28; Creatinine, Ser  0.89; Hemoglobin 14.9; Platelets 169; Potassium 3.6; Sodium 139   Other Studies Reviewed Today:  Echocardiogram 09/30/2019: 1. Left ventricular ejection fraction, by estimation, is 60 to 65%. The  left ventricle has normal function. The left ventricle has no regional  wall motion abnormalities. Left ventricular diastolic parameters were  normal.  2. Right ventricular systolic function is normal. The right ventricular  size is normal.  3. Left atrial size was moderately dilated.  4. The mitral valve is normal in structure. Trivial mitral valve  regurgitation. No evidence of mitral stenosis.  5. The aortic valve is tricuspid. Aortic valve regurgitation is not  visualized. No aortic stenosis is present.  6. The inferior vena cava is dilated in size with >50% respiratory  variability, suggesting right atrial pressure of 8 mmHg.   Assessment and Plan:  1.  Worsening exertional chest tightness concerning for accelerating angina.  She reports symptoms over the last month or so.  She has no previously documented history of obstructive CAD, LVEF was normal by echocardiogram in July.  She has history of hypertension, also previous TIA.  Although it is possible she could be having symptomatic bradycardia since beta-blocker was increased, or even potentially recurring atrial fibrillation contributing to the symptoms, main concern is to exclude obstructive CAD and then proceed from there.  We have discussed diagnostic options and after reviewing the risks and benefits, plan is to proceed with a diagnostic cardiac catheterization.  This will be scheduled for next week.  In the meanwhile reduce Lopressor to 25 mg twice daily, continue losartan and Lipitor.  2.  Paroxysmal atrial fibrillation, CHA2DS2-VASc score of at least 6.  She is on Eliquis for stroke prophylaxis.  Breakthrough rapid episode noted back in July, none obvious since that time.  Reducing beta-blocker dose given bradycardia in sinus  rhythm.  If her coronaries are reassuring, may need to consider further cardiac monitoring to see if she is having more frequent episodes that might require antiarrhythmic therapy.  3.  Essential hypertension, blood pressures well controlled today.  4.  Chronic diastolic heart failure, she reports symptom improvement after switch to Demadex.  Follow-up BMET.   Medication Adjustments/Labs and Tests Ordered: Current medicines are reviewed at length with the patient today.  Concerns regarding medicines are outlined above.   Tests Ordered: Orders Placed This Encounter  Procedures  . EKG 12-Lead    Medication Changes: Meds ordered this encounter  Medications  . metoprolol tartrate (LOPRESSOR) 25 MG tablet    Sig: Take 1 tablet (25 mg total) by mouth 2 (two) times daily.    Dispense:  180 tablet    Refill:  3    11/21/19 reduced to 25 mg bid    Disposition:  Follow up after procedure.  Signed, Satira Sark, MD, [facility] 11/21/2019 12:21 PM    [place] at [facility] [address], [place], [place] [address] Phone: [phone]; Fax: [phone]

## 2019-11-21 NOTE — H&P (View-Only) (Signed)
Cardiology Office Note  Date: 11/21/2019   ID: Kim Dixon, DOB 06/29/1940, MRN [id number]  PCP:  Asencion Noble, MD  Cardiologist:  Rozann Lesches, MD Electrophysiologist:  None   Chief Complaint  Patient presents with  . Cardiac follow-up    History of Present Illness: Kim Dixon is a 79 y.o. female last seen in July by Mr. Leonides Sake NP.  She presents for a follow-up visit.  She tells me that over the last month or so she has been experiencing worsening exertional chest tightness and fatigue, has to stop sometimes when walking from her bedroom to her kitchen.  This is despite feeling better in terms of overall fluid status since her diuretics were changed.  Recent echocardiogram in July revealed LVEF 60 to 65% with normal RV contraction, moderately dilated left atrium, no major valvular abnormalities.  She is currently on Demadex 40 mg daily. She has not had follow-up lab work as yet for reassessment of renal function and potassium.  Breakthrough episode of rapid atrial fibrillation noted in early July.  No definite recurrence since that time.  Beta-blocker was advanced but she is bradycardic in sinus rhythm.  She continues on Eliquis for stroke prophylaxis.  I personally reviewed her ECG today which shows sinus bradycardia at 52 bpm with left atrial enlargement and low voltage.  Past Medical History:  Diagnosis Date  . Anxiety   . Arthritis   . Depression   . Diverticulosis   . Essential hypertension   . Glaucoma   . Hematuria   . History of kidney stones   . History of pneumonia   . Hyperlipidemia   . PAF (paroxysmal atrial fibrillation) ([place])   . Pneumonia   . Stress incontinence   . TIA (transient ischemic attack)    January 2020  . Urinary frequency   . Varicose veins     Past Surgical History:  Procedure Laterality Date  . ABDOMINAL HYSTERECTOMY    . ABLATION SAPHENOUS VEIN W/ RFA Bilateral   . COLONOSCOPY    . COLONOSCOPY N/A 06/18/2013    Procedure: COLONOSCOPY;  Surgeon: Rogene Houston, MD;  [facility];  Service: Endoscopy;  Laterality: N/A;  830  . CYSTOSCOPY WITH RETROGRADE PYELOGRAM, URETEROSCOPY AND STENT PLACEMENT Left 10/18/2018   Procedure: CYSTOSCOPY WITH RETROGRADE PYELOGRAM, URETEROSCOPY AND STENT PLACEMENT;  Surgeon: Alexis Frock, MD;  [facility];  Service: Urology;  Laterality: Left;  1 HR  . EYE SURGERY     laser surgery bilat   . HOLMIUM LASER APPLICATION Left 0/93/2355   Procedure: HOLMIUM LASER APPLICATION;  Surgeon: Alexis Frock, MD;  [facility];  Service: Urology;  Laterality: Left;  . LITHOTRIPSY    . MASS EXCISION N/A 12/07/2017   Procedure: EXCISION 3CM CYST ON BACK;  Surgeon: Aviva Signs, MD;  Location: [facility];  Service: General;  Laterality: N/A;  . PARATHYROIDECTOMY Left 04/22/2019   Procedure: LEFT INFERIOR PARATHYROIDECTOMY;  Surgeon: Armandina Gemma, MD;  [facility];  Service: General;  Laterality: Left;  . TONSILLECTOMY    . TOTAL KNEE ARTHROPLASTY Left 08/25/2014   Procedure: LEFT TOTAL KNEE ARTHROPLASTY;  Surgeon: Paralee Cancel, MD;  [facility];  Service: Orthopedics;  Laterality: Left;    Current Outpatient Medications  Medication Sig Dispense Refill  . Ascorbic Acid (VITAMIN C) 1000 MG tablet Take 3,000 mg by mouth daily.     Marland Kitchen atorvastatin (LIPITOR) 10 MG tablet Take 10 mg by mouth every Monday, Wednesday, and  Friday at 8 PM.     . bimatoprost (LUMIGAN) 0.01 % SOLN Place 1 drop into both eyes at bedtime.    . citalopram (CELEXA) 20 MG tablet Take 20 mg by mouth daily.     Marland Kitchen ELIQUIS 5 MG TABS tablet TAKE ONE TABLET (5MG  TOTAL) BY MOUTH TWICE DAILY (Patient taking differently: Take 5 mg by mouth 2 (two) times daily. ) 60 tablet 11  . losartan (COZAAR) 50 MG tablet Take 1 tablet (50 mg total) by mouth daily. 30 tablet 0  . Menthol, Topical Analgesic, (BIOFREEZE ROLL-ON EX) Apply 1 application topically 3 (three) times daily as needed (back pain.).    Marland Kitchen  timolol (TIMOPTIC) 0.5 % ophthalmic solution Place 1 drop into both eyes daily.    Marland Kitchen torsemide (DEMADEX) 20 MG tablet Take 2 tablets (40 mg total) by mouth daily. 180 tablet 1  . Vitamin D3 (VITAMIN D) 25 MCG tablet Take 1,000 Units by mouth daily.    . metoprolol tartrate (LOPRESSOR) 25 MG tablet Take 1 tablet (25 mg total) by mouth 2 (two) times daily. 180 tablet 3   No current facility-administered medications for this visit.   Allergies:  Naproxen sodium   Social History: The patient  reports that she quit smoking about 26 years ago. Her smoking use included cigarettes. She has a 90.00 pack-year smoking history. She has never used smokeless tobacco. She reports current alcohol use. She reports that she does not use drugs.   Family History: The patient's family history includes Coronary artery disease in her mother; Lung cancer in her father.   ROS:   Occasional lightheadedness.  Physical Exam: VS:  BP 110/66   Pulse (!) 54   Ht 5' 3.5" (1.613 m)   Wt 276 lb (125.2 kg)   SpO2 93%   BMI 48.12 kg/m , BMI Body mass index is 48.12 kg/m.  Wt Readings from Last 3 Encounters:  11/21/19 276 lb (125.2 kg)  09/19/19 273 lb (123.8 kg)  09/18/19 280 lb (127 kg)    General: Obese woman, appears comfortable at rest. HEENT: Conjunctiva and lids normal, wearing a mask. Neck: Supple, no elevated JVP or carotid bruits, no thyromegaly. Lungs: Clear to auscultation, nonlabored breathing at rest. Cardiac: Regular rate and rhythm, no S3, soft systolic murmur, no pericardial rub. Abdomen: Soft, bowel sounds present, no guarding or rebound. Extremities: Mild ankle edema, distal pulses 2+. Skin: Warm and dry. Musculoskeletal: No kyphosis. Neuropsychiatric: Alert and oriented x3, affect grossly appropriate.  ECG:  An ECG dated 09/18/2019 was personally reviewed today and demonstrated:  Rapid atrial fibrillation, left posterior fascicular block.  Recent Labwork: 09/18/2019: BUN 28; Creatinine, Ser  0.89; Hemoglobin 14.9; Platelets 169; Potassium 3.6; Sodium 139   Other Studies Reviewed Today:  Echocardiogram 09/30/2019: 1. Left ventricular ejection fraction, by estimation, is 60 to 65%. The  left ventricle has normal function. The left ventricle has no regional  wall motion abnormalities. Left ventricular diastolic parameters were  normal.  2. Right ventricular systolic function is normal. The right ventricular  size is normal.  3. Left atrial size was moderately dilated.  4. The mitral valve is normal in structure. Trivial mitral valve  regurgitation. No evidence of mitral stenosis.  5. The aortic valve is tricuspid. Aortic valve regurgitation is not  visualized. No aortic stenosis is present.  6. The inferior vena cava is dilated in size with >50% respiratory  variability, suggesting right atrial pressure of 8 mmHg.   Assessment and Plan:  1.  Worsening exertional chest tightness concerning for accelerating angina.  She reports symptoms over the last month or so.  She has no previously documented history of obstructive CAD, LVEF was normal by echocardiogram in July.  She has history of hypertension, also previous TIA.  Although it is possible she could be having symptomatic bradycardia since beta-blocker was increased, or even potentially recurring atrial fibrillation contributing to the symptoms, main concern is to exclude obstructive CAD and then proceed from there.  We have discussed diagnostic options and after reviewing the risks and benefits, plan is to proceed with a diagnostic cardiac catheterization.  This will be scheduled for next week.  In the meanwhile reduce Lopressor to 25 mg twice daily, continue losartan and Lipitor.  2.  Paroxysmal atrial fibrillation, CHA2DS2-VASc score of at least 6.  She is on Eliquis for stroke prophylaxis.  Breakthrough rapid episode noted back in July, none obvious since that time.  Reducing beta-blocker dose given bradycardia in sinus  rhythm.  If her coronaries are reassuring, may need to consider further cardiac monitoring to see if she is having more frequent episodes that might require antiarrhythmic therapy.  3.  Essential hypertension, blood pressures well controlled today.  4.  Chronic diastolic heart failure, she reports symptom improvement after switch to Demadex.  Follow-up BMET.   Medication Adjustments/Labs and Tests Ordered: Current medicines are reviewed at length with the patient today.  Concerns regarding medicines are outlined above.   Tests Ordered: Orders Placed This Encounter  Procedures  . EKG 12-Lead    Medication Changes: Meds ordered this encounter  Medications  . metoprolol tartrate (LOPRESSOR) 25 MG tablet    Sig: Take 1 tablet (25 mg total) by mouth 2 (two) times daily.    Dispense:  180 tablet    Refill:  3    11/21/19 reduced to 25 mg bid    Disposition:  Follow up after procedure.  Signed, Satira Sark, MD, [facility] 11/21/2019 12:21 PM    [place] at [facility] [address] [place], [place] [address] Phone: [phone]; Fax: [phone]

## 2019-11-25 ENCOUNTER — Other Ambulatory Visit (HOSPITAL_COMMUNITY)
Admission: RE | Admit: 2019-11-25 | Discharge: 2019-11-25 | Disposition: A | Discharge status: Home / Self Care | Payer: Medicare PPO | Source: Ambulatory Visit | Attending: Cardiology | Admitting: Cardiology

## 2019-11-25 ENCOUNTER — Telehealth: Payer: Self-pay

## 2019-11-25 DIAGNOSIS — Z20822 Contact with and (suspected) exposure to covid-19: Secondary | ICD-10-CM | POA: Insufficient documentation

## 2019-11-25 DIAGNOSIS — Z01812 Encounter for preprocedural laboratory examination: Secondary | ICD-10-CM | POA: Insufficient documentation

## 2019-11-25 NOTE — Telephone Encounter (Addendum)
Pt contacted pre-catheterization scheduled at [facility] for: Thursday 11/27/19  Verified arrival time and place: [facility]) at: 7 am    No solid food after midnight prior to cath, clear liquids until 5 AM day of procedure. CONTRAST ALLERGY: NO  AM meds can be  taken pre-cath with sips of water including: ASA 81 mg  Holding Eliquis starting 11/25/19.Marland Kitchen confirmed with the pt Hold Torsemide morning of 11/27/19   Confirmed patient has responsible adult to drive home post procedure and be with patient first 24 hours after arriving home:  You are allowed ONE visitor in the waiting room during the time you are at the hospital for your procedure. Both you and your visitor must wear a mask once you enter the hospital.       COVID-19 Pre-Screening Questions:  . In the past 10 days have you had a new cough, shortness of breath, headache, congestion, fever (100 or greater) unexplained body aches, new sore throat, or sudden loss of taste or sense of smell? NO . In the past 10 days have you been around anyone with known Covid 19? NO . Have you been vaccinated for COVID-19? Yes 03/2019

## 2019-11-26 ENCOUNTER — Telehealth: Payer: Self-pay

## 2019-11-26 ENCOUNTER — Telehealth: Payer: Self-pay | Admitting: Cardiology

## 2019-11-26 LAB — SARS CORONAVIRUS 2 (TAT 6-24 HRS): SARS Coronavirus 2: NEGATIVE

## 2019-11-26 NOTE — Telephone Encounter (Signed)
Left message for the pt to please call back to go over her Pre procedure instructions for 11/27/19.

## 2019-11-26 NOTE — Telephone Encounter (Signed)
Spoke with the pt and she verbalized understanding of her pre procedure instructions... she is COVID negative as of 11/25/19.

## 2019-11-26 NOTE — Telephone Encounter (Signed)
Error

## 2019-11-26 NOTE — Telephone Encounter (Signed)
Message  From [facility]:  Good afternoon, I have been on the phone with [facility] regarding this [facility] for tomorrow. They are wondering about switching this to a NUC because based on the echo findings showing no regional wall abnormalities. Dr. Uvaldo Bristle said that it would take 30 seconds to speak to the ordering MD to approve to get the reasoning for the CATH over the [place]. His direct # is [phone]. If there is anything that you would like for me to add, I will definitely call them back and let them know.    Message from [place]: I would not be able to resolve an insurance appeal as the DOD for this patient as I have not clinically evaluated this patient. If there is an issue I would reschedule the cath for Friday and have Sam address tomorrow when he is back in the office    I moved patients heart cath to Friday, 11/28/19 at 9 am with Dr.Arida. [name] is aware.She will take her Torsemide tomorrow but will continue to hold Eliquis.    I will await dispo from [place].

## 2019-11-27 NOTE — Telephone Encounter (Signed)
Message from Raymore Hepler:No need to call [employer]. Josem Kaufmann is now on file. Thank you.     Patient notified that cath is on for tomorrow 11/28/19

## 2019-11-28 ENCOUNTER — Ambulatory Visit (HOSPITAL_COMMUNITY)
Admission: RE | Disposition: A | Discharge status: Home / Self Care | Payer: Medicare PPO | Source: Home / Self Care | Attending: Cardiovascular Disease

## 2019-11-28 ENCOUNTER — Ambulatory Visit (HOSPITAL_COMMUNITY)
Admission: RE | Admit: 2019-11-28 | Discharge: 2019-11-28 | Disposition: A | Discharge status: Home / Self Care | Payer: Medicare PPO | Attending: Cardiovascular Disease | Admitting: Cardiovascular Disease

## 2019-11-28 DIAGNOSIS — Z87891 Personal history of nicotine dependence: Secondary | ICD-10-CM | POA: Diagnosis not present

## 2019-11-28 DIAGNOSIS — F419 Anxiety disorder, unspecified: Secondary | ICD-10-CM | POA: Diagnosis not present

## 2019-11-28 DIAGNOSIS — I209 Angina pectoris, unspecified: Secondary | ICD-10-CM

## 2019-11-28 DIAGNOSIS — I200 Unstable angina: Secondary | ICD-10-CM

## 2019-11-28 DIAGNOSIS — Z79899 Other long term (current) drug therapy: Secondary | ICD-10-CM | POA: Diagnosis not present

## 2019-11-28 DIAGNOSIS — I5032 Chronic diastolic (congestive) heart failure: Secondary | ICD-10-CM | POA: Diagnosis not present

## 2019-11-28 DIAGNOSIS — I480 Paroxysmal atrial fibrillation: Secondary | ICD-10-CM | POA: Diagnosis not present

## 2019-11-28 DIAGNOSIS — Z7901 Long term (current) use of anticoagulants: Secondary | ICD-10-CM | POA: Insufficient documentation

## 2019-11-28 DIAGNOSIS — I110 Hypertensive heart disease with heart failure: Secondary | ICD-10-CM | POA: Insufficient documentation

## 2019-11-28 DIAGNOSIS — K5790 Diverticulosis of intestine, part unspecified, without perforation or abscess without bleeding: Secondary | ICD-10-CM | POA: Insufficient documentation

## 2019-11-28 DIAGNOSIS — Z8673 Personal history of transient ischemic attack (TIA), and cerebral infarction without residual deficits: Secondary | ICD-10-CM | POA: Insufficient documentation

## 2019-11-28 DIAGNOSIS — E785 Hyperlipidemia, unspecified: Secondary | ICD-10-CM | POA: Insufficient documentation

## 2019-11-28 DIAGNOSIS — F329 Major depressive disorder, single episode, unspecified: Secondary | ICD-10-CM | POA: Diagnosis not present

## 2019-11-28 HISTORY — PX: LEFT HEART CATH AND CORONARY ANGIOGRAPHY: CATH118249

## 2019-11-28 SURGERY — LEFT HEART CATH AND CORONARY ANGIOGRAPHY
Anesthesia: LOCAL

## 2019-11-28 MED ORDER — SODIUM CHLORIDE 0.9 % WEIGHT BASED INFUSION
3.0000 mL/kg/h | INTRAVENOUS | Status: AC
  Administered 2019-11-28: 3 mL/kg/h via INTRAVENOUS

## 2019-11-28 MED ORDER — ONDANSETRON HCL 4 MG/2ML IJ SOLN: 4.0000 mg | Freq: Four times a day (QID) | INTRAMUSCULAR | Status: DC | PRN

## 2019-11-28 MED ORDER — IOHEXOL 350 MG/ML SOLN
INTRAVENOUS | Status: DC | PRN
  Administered 2019-11-28: 75 mL via INTRA_ARTERIAL

## 2019-11-28 MED ORDER — SODIUM CHLORIDE 0.9% FLUSH: 3.0000 mL | INTRAVENOUS | Status: DC | PRN

## 2019-11-28 MED ORDER — FENTANYL CITRATE (PF) 100 MCG/2ML IJ SOLN
INTRAMUSCULAR | Status: DC | PRN
Start: 2019-11-28 — End: 2019-11-28
  Administered 2019-11-28: 25 ug via INTRAVENOUS

## 2019-11-28 MED ORDER — MIDAZOLAM HCL 2 MG/2ML IJ SOLN
INTRAMUSCULAR | Status: DC | PRN
  Administered 2019-11-28: 1 mg via INTRAVENOUS

## 2019-11-28 MED ORDER — VERAPAMIL HCL 2.5 MG/ML IV SOLN
INTRAVENOUS | Status: DC | PRN
  Administered 2019-11-28: 10 mL via INTRA_ARTERIAL

## 2019-11-28 MED ORDER — LIDOCAINE HCL (PF) 1 % IJ SOLN
INTRAMUSCULAR | Status: AC
  Filled 2019-11-28: qty 30

## 2019-11-28 MED ORDER — HEPARIN (PORCINE) IN NACL 1000-0.9 UT/500ML-% IV SOLN
INTRAVENOUS | Status: DC | PRN
  Administered 2019-11-28 (×2): 500 mL

## 2019-11-28 MED ORDER — MIDAZOLAM HCL 2 MG/2ML IJ SOLN
INTRAMUSCULAR | Status: AC
  Filled 2019-11-28: qty 2

## 2019-11-28 MED ORDER — HEPARIN SODIUM (PORCINE) 1000 UNIT/ML IJ SOLN
INTRAMUSCULAR | Status: AC
  Filled 2019-11-28: qty 1

## 2019-11-28 MED ORDER — HEPARIN SODIUM (PORCINE) 1000 UNIT/ML IJ SOLN
INTRAMUSCULAR | Status: DC | PRN
  Administered 2019-11-28: 6000 [IU] via INTRAVENOUS

## 2019-11-28 MED ORDER — SODIUM CHLORIDE 0.9 % IV SOLN: 250.0000 mL | INTRAVENOUS | Status: DC | PRN

## 2019-11-28 MED ORDER — HEPARIN (PORCINE) IN NACL 1000-0.9 UT/500ML-% IV SOLN
INTRAVENOUS | Status: AC
  Filled 2019-11-28: qty 1000

## 2019-11-28 MED ORDER — NITROGLYCERIN 1 MG/10 ML FOR IR/CATH LAB
INTRA_ARTERIAL | Status: AC
  Filled 2019-11-28: qty 10

## 2019-11-28 MED ORDER — SODIUM CHLORIDE 0.9% FLUSH: 3.0000 mL | Freq: Two times a day (BID) | INTRAVENOUS | Status: DC

## 2019-11-28 MED ORDER — ACETAMINOPHEN 325 MG PO TABS: 650.0000 mg | ORAL_TABLET | ORAL | Status: DC | PRN

## 2019-11-28 MED ORDER — VERAPAMIL HCL 2.5 MG/ML IV SOLN
INTRAVENOUS | Status: AC
  Filled 2019-11-28: qty 2

## 2019-11-28 MED ORDER — LIDOCAINE HCL (PF) 1 % IJ SOLN
INTRAMUSCULAR | Status: DC | PRN
  Administered 2019-11-28: 2 mL

## 2019-11-28 MED ORDER — FENTANYL CITRATE (PF) 100 MCG/2ML IJ SOLN
INTRAMUSCULAR | Status: AC
  Filled 2019-11-28: qty 2

## 2019-11-28 MED ORDER — SODIUM CHLORIDE 0.9 % WEIGHT BASED INFUSION: 1.0000 mL/kg/h | INTRAVENOUS | Status: DC

## 2019-11-28 MED ORDER — ASPIRIN 81 MG PO CHEW: 81.0000 mg | CHEWABLE_TABLET | ORAL | Status: DC

## 2019-11-28 SURGICAL SUPPLY — 11 items
CATH INFINITI 5FR ANG PIGTAIL (CATHETERS) ×2 IMPLANT
CATH INFINITI 5FR JK (CATHETERS) ×2 IMPLANT
DEVICE RAD COMP TR BAND LRG (VASCULAR PRODUCTS) ×2 IMPLANT
GLIDESHEATH SLEND SS 6F .021 (SHEATH) ×2 IMPLANT
GUIDEWIRE INQWIRE 1.5J.035X260 (WIRE) ×1 IMPLANT
INQWIRE 1.5J .035X260CM (WIRE) ×2
KIT HEART LEFT (KITS) ×2 IMPLANT
PACK CARDIAC CATHETERIZATION (CUSTOM PROCEDURE TRAY) ×2 IMPLANT
SYR MEDRAD MARK 7 150ML (SYRINGE) ×2 IMPLANT
TRANSDUCER W/STOPCOCK (MISCELLANEOUS) ×2 IMPLANT
TUBING CIL FLEX 10 FLL-RA (TUBING) ×2 IMPLANT

## 2019-11-28 NOTE — Discharge Instructions (Signed)
Resume Eliquis tomorrow.  Radial Site Care  This sheet gives you information about how to care for yourself after your procedure. Your health care provider may also give you more specific instructions. If you have problems or questions, contact your health care provider. What can I expect after the procedure? After the procedure, it is common to have:  Bruising and tenderness at the catheter insertion area. Follow these instructions at home: Medicines  Take over-the-counter and prescription medicines only as told by your health care provider. Insertion site care  Follow instructions from your health care provider about how to take care of your insertion site. Make sure you: ? Wash your hands with soap and water before you change your bandage (dressing). If soap and water are not available, use hand sanitizer. ? Change your dressing as told by your health care provider. ? Leave stitches (sutures), skin glue, or adhesive strips in place. These skin closures may need to stay in place for 2 weeks or longer. If adhesive strip edges start to loosen and curl up, you may trim the loose edges. Do not remove adhesive strips completely unless your health care provider tells you to do that.  Check your insertion site every day for signs of infection. Check for: ? Redness, swelling, or pain. ? Fluid or blood. ? Pus or a bad smell. ? Warmth.  Do not take baths, swim, or use a hot tub until your health care provider approves.  You may shower 24-48 hours after the procedure, or as directed by your health care provider. ? Remove the dressing and gently wash the site with plain soap and water. ? Pat the area dry with a clean towel. ? Do not rub the site. That could cause bleeding.  Do not apply powder or lotion to the site. Activity   For 24 hours after the procedure, or as directed by your health care provider: ? Do not flex or bend the affected arm. ? Do not push or pull heavy objects with the  affected arm. ? Do not drive yourself home from the hospital or clinic. You may drive 24 hours after the procedure unless your health care provider tells you not to. ? Do not operate machinery or power tools.  Do not lift anything that is heavier than 10 lb (4.5 kg), or the limit that you are told, until your health care provider says that it is safe.  Ask your health care provider when it is okay to: ? Return to work or school. ? Resume usual physical activities or sports. ? Resume sexual activity. General instructions  If the catheter site starts to bleed, raise your arm and put firm pressure on the site. If the bleeding does not stop, get help right away. This is a medical emergency.  If you went home on the same day as your procedure, a responsible adult should be with you for the first 24 hours after you arrive home.  Keep all follow-up visits as told by your health care provider. This is important. Contact a health care provider if:  You have a fever.  You have redness, swelling, or yellow drainage around your insertion site. Get help right away if:  You have unusual pain at the radial site.  The catheter insertion area swells very fast.  The insertion area is bleeding, and the bleeding does not stop when you hold steady pressure on the area.  Your arm or hand becomes pale, cool, tingly, or numb. These symptoms may  represent a serious problem that is an emergency. Do not wait to see if the symptoms will go away. Get medical help right away. Call your local emergency services (911 in the [place] Do not drive yourself to the hospital. Summary  After the procedure, it is common to have bruising and tenderness at the site.  Follow instructions from your health care provider about how to take care of your radial site wound. Check the wound every day for signs of infection.  Do not lift anything that is heavier than 10 lb (4.5 kg), or the limit that you are told, until your  health care provider says that it is safe. This information is not intended to replace advice given to you by your health care provider. Make sure you discuss any questions you have with your health care provider. Document Revised: 04/11/2017 Document Reviewed: 04/11/2017 Elsevier Patient Education  2020 [employer].

## 2019-11-28 NOTE — Interval H&P Note (Signed)
History and Physical Interval Note:  11/28/2019 9:21 AMCath Lab Visit (complete for each Cath Lab visit)  Clinical Evaluation Leading to the Procedure:   ACS: No.  Non-ACS:    Anginal Classification: CCS III  Anti-ischemic medical therapy: Minimal Therapy (1 class of medications)  Non-Invasive Test Results: No non-invasive testing performed  Prior CABG: No previous CABG        Kim Dixon  has presented today for surgery, with the diagnosis of agina.  The various methods of treatment have been discussed with the patient and family. After consideration of risks, benefits and other options for treatment, the patient has consented to  Procedure(s): LEFT HEART CATH AND CORONARY ANGIOGRAPHY (N/A) as a surgical intervention.  The patient's history has been reviewed, patient examined, no change in status, stable for surgery.  I have reviewed the patient's chart and labs.  Questions were answered to the patient's satisfaction.     Kathlyn Sacramento

## 2019-12-01 ENCOUNTER — Telehealth: Payer: Self-pay | Admitting: Cardiology

## 2019-12-01 ENCOUNTER — Encounter (HOSPITAL_COMMUNITY): Payer: Self-pay | Admitting: Cardiovascular Disease

## 2019-12-01 DIAGNOSIS — Z79899 Other long term (current) drug therapy: Secondary | ICD-10-CM

## 2019-12-01 MED FILL — Nitroglycerin IV Soln 100 MCG/ML in D5W: INTRA_ARTERIAL | Qty: 10 | Status: AC

## 2019-12-01 NOTE — Telephone Encounter (Signed)
Patient states she still has "elephant on chest" after cath.There was mention in cath report about adjusting torsemide dose. She is actually able to attend to affairs, is going to [facility] today.She awaits our call.

## 2019-12-01 NOTE — Telephone Encounter (Signed)
New message     Patient had heart cath on Friday and it showed no results and she is still having symptoms , she wants to know what the next step will be   Give her 30-45 minutes she is getting a shower

## 2019-12-02 MED ORDER — TORSEMIDE 20 MG PO TABS
60.0000 mg | ORAL_TABLET | Freq: Every day | ORAL | 1 refills | Status: DC
Start: 2019-12-02 — End: 2019-12-31

## 2019-12-02 NOTE — Telephone Encounter (Signed)
She has normal coronary arteries by recent cardiac catheterization.  LVEDP was moderately increased suggesting diastolic dysfunction could be at least part of her symptomatology.  Let's increase her Demadex to 60 mg daily.  She will need a follow-up BMET in 2 weeks.

## 2019-12-02 NOTE — Telephone Encounter (Signed)
I spoke with patient.She will increase Torsemide to 60 mg daily and repeat bmet in 2 weeks,mailed lab slip to her.

## 2019-12-15 ENCOUNTER — Ambulatory Visit: Payer: Medicare PPO | Admitting: Dermatology

## 2019-12-16 ENCOUNTER — Ambulatory Visit: Payer: Medicare PPO | Admitting: Dermatology

## 2019-12-16 ENCOUNTER — Encounter: Payer: Self-pay | Admitting: Dermatology

## 2019-12-16 ENCOUNTER — Other Ambulatory Visit: Payer: Self-pay

## 2019-12-16 DIAGNOSIS — L738 Other specified follicular disorders: Secondary | ICD-10-CM

## 2019-12-16 DIAGNOSIS — D485 Neoplasm of uncertain behavior of skin: Secondary | ICD-10-CM

## 2019-12-16 DIAGNOSIS — C4441 Basal cell carcinoma of skin of scalp and neck: Secondary | ICD-10-CM

## 2019-12-16 DIAGNOSIS — L565 Disseminated superficial actinic porokeratosis (DSAP): Secondary | ICD-10-CM | POA: Diagnosis not present

## 2019-12-16 DIAGNOSIS — L309 Dermatitis, unspecified: Secondary | ICD-10-CM

## 2019-12-16 DIAGNOSIS — L821 Other seborrheic keratosis: Secondary | ICD-10-CM | POA: Diagnosis not present

## 2019-12-16 DIAGNOSIS — Z1283 Encounter for screening for malignant neoplasm of skin: Secondary | ICD-10-CM | POA: Diagnosis not present

## 2019-12-16 DIAGNOSIS — C4491 Basal cell carcinoma of skin, unspecified: Secondary | ICD-10-CM

## 2019-12-16 DIAGNOSIS — L729 Follicular cyst of the skin and subcutaneous tissue, unspecified: Secondary | ICD-10-CM

## 2019-12-16 DIAGNOSIS — L720 Epidermal cyst: Secondary | ICD-10-CM | POA: Diagnosis not present

## 2019-12-16 HISTORY — DX: Basal cell carcinoma of skin, unspecified: C44.91

## 2019-12-16 NOTE — Patient Instructions (Addendum)
Biopsy, Surgery (Curettage) & Surgery (Excision) Aftercare Instructions  1. Okay to remove bandage in 24 hours  2. Wash area with soap and water  3. Apply Vaseline to area twice daily until healed (Not Neosporin)  4. Okay to cover with a Band-Aid to decrease the chance of infection or prevent irritation from clothing; also it's okay to uncover lesion at home.  5. Suture instructions: return to our office in 7-10 or 10-14 days for a nurse visit for suture removal. Variable healing with sutures, if pain or itching occurs call our office. It's okay to shower or bathe 24 hours after sutures are given.  6. The following risks may occur after a biopsy, curettage or excision: bleeding, scarring, discoloration, recurrence, infection (redness, yellow drainage, pain or swelling).  7. For questions, concerns and results call our office at [place] before 4pm & Friday before 3pm. Biopsy results will be available in 1 week.  Routine follow-up for Kim Dixon date of birth 06/08/1940.  Her excellent primary care physician Dr.Roy Willey Blade found a spot behind her lower right ear; this pearly 1 cm focally eroded nodule is almost certainly another nonmole skin cancer.  Biopsy obtained and [name] could check on MyChart or call my office on Monday; surgical follow-up appointment will be scheduled.  The rest of her examination from her lower legs to her scalp is clean.  There is a recurrent cyst on her right upper central back which reportedly had been removed four times previously; I discouraged more surgery on the spot.  She has roughly ten 2 to 3 mm tiny white dots in her scalp which are many cysts called milia which require no intervention.  There are multiple benign keratoses on her back and collarbone area.  There is one keratosis on the central left forehead along with multiple smooth 2 mm bumps called sebaceous hyperplasia; all of these are safe to leave if stable.  On her legs from the knees down are 3  dozen sharp edged 1 cm scaly spots called DSA P.  The risk of these developing skin cancer is not zero but it is low and I discouraged any procedure unless there is obvious clinical change.  There is no recurrence of the skin cancer on her upper leg.  There are no atypical moles.  Historically there is a spot near the left ear that comes and goes which today is clear.  All questions answered.  We also discussed 2 forms of dermatitis/eczema affecting episodically her hands and feet.  On her feet she sees little itchy bumps that form tiny hard dots that when punctured a little fluid comes out and this helps it to heal.  This history fits a disorder "dyshidrotic" eczema.  The hands will seasonally most often occur in the spring or fall and peels for weeks.  This is likely an environmental trigger but allergy testing or prescription cortisone creams are usually not very helpful.  I will look at these if these flare.

## 2019-12-18 ENCOUNTER — Other Ambulatory Visit (HOSPITAL_COMMUNITY)
Admission: RE | Admit: 2019-12-18 | Discharge: 2019-12-18 | Disposition: A | Discharge status: Home / Self Care | Payer: Medicare PPO | Source: Ambulatory Visit | Attending: Cardiology | Admitting: Cardiology

## 2019-12-18 ENCOUNTER — Other Ambulatory Visit: Payer: Self-pay

## 2019-12-18 ENCOUNTER — Telehealth: Payer: Self-pay

## 2019-12-18 DIAGNOSIS — Z79899 Other long term (current) drug therapy: Secondary | ICD-10-CM | POA: Diagnosis not present

## 2019-12-18 LAB — BASIC METABOLIC PANEL
Anion gap: 11 (ref 5–15)
BUN: 15 mg/dL (ref 8–23)
CO2: 34 mmol/L — ABNORMAL HIGH (ref 22–32)
Calcium: 8.9 mg/dL (ref 8.9–10.3)
Chloride: 93 mmol/L — ABNORMAL LOW (ref 98–111)
Creatinine, Ser: 0.89 mg/dL (ref 0.44–1.00)
GFR calc Af Amer: >60 mL/min (ref >60)
GFR calc non Af Amer: >60 mL/min (ref >60)
Glucose, Bld: 175 mg/dL — ABNORMAL HIGH (ref 70–99)
Potassium: 3.4 mmol/L — ABNORMAL LOW (ref 3.5–5.1)
Sodium: 138 mmol/L (ref 135–145)

## 2019-12-18 MED ORDER — POTASSIUM CHLORIDE CRYS ER 20 MEQ PO TBCR: 20.0000 meq | EXTENDED_RELEASE_TABLET | Freq: Every day | ORAL | 3 refills | Status: DC

## 2019-12-18 NOTE — Telephone Encounter (Signed)
I spoke with patient and gave her her lab results. She will start Potassium 20 meq daily,e-scribed to [employer].

## 2019-12-18 NOTE — Telephone Encounter (Signed)
-----   Message from Satira Sark, MD sent at 12/18/2019  2:42 PM EDT ----- Results reviewed.  Renal function remains stable on higher dose Demadex but potassium decreasing down to 3.4.  Start KCl 20 mEq daily.

## 2019-12-19 ENCOUNTER — Telehealth: Payer: Self-pay | Admitting: *Deleted

## 2019-12-19 ENCOUNTER — Encounter: Payer: Self-pay | Admitting: *Deleted

## 2019-12-19 NOTE — Telephone Encounter (Signed)
-----   Message from [name], MD sent at 12/19/2019  7:19 AM EDT ----- Schedule surgery with Dr. Darene Lamer

## 2019-12-19 NOTE — Telephone Encounter (Signed)
Path to patient. She already has appointment in November to have area treated.

## 2019-12-19 NOTE — Telephone Encounter (Signed)
Left message for patient to call back  

## 2019-12-29 DIAGNOSIS — H25813 Combined forms of age-related cataract, bilateral: Secondary | ICD-10-CM | POA: Diagnosis not present

## 2019-12-29 DIAGNOSIS — H401133 Primary open-angle glaucoma, bilateral, severe stage: Secondary | ICD-10-CM | POA: Diagnosis not present

## 2019-12-30 DIAGNOSIS — Z23 Encounter for immunization: Secondary | ICD-10-CM | POA: Diagnosis not present

## 2019-12-31 ENCOUNTER — Other Ambulatory Visit: Payer: Self-pay

## 2019-12-31 ENCOUNTER — Ambulatory Visit: Payer: Medicare PPO | Admitting: Cardiology

## 2019-12-31 ENCOUNTER — Encounter: Payer: Self-pay | Admitting: Cardiology

## 2019-12-31 VITALS — BP 138/78 | HR 66 | Ht 60.5 in | Wt 268.0 lb

## 2019-12-31 DIAGNOSIS — I5032 Chronic diastolic (congestive) heart failure: Secondary | ICD-10-CM

## 2019-12-31 DIAGNOSIS — I480 Paroxysmal atrial fibrillation: Secondary | ICD-10-CM

## 2019-12-31 MED ORDER — TORSEMIDE 20 MG PO TABS
60.0000 mg | ORAL_TABLET | Freq: Every day | ORAL | 3 refills | Status: DC
Start: 2019-12-31 — End: 2020-12-30

## 2019-12-31 NOTE — Patient Instructions (Signed)
Medication Instructions:   Your physician recommends that you continue on your current medications as directed. Please refer to the Current Medication list given to you today.  *If you need a refill on your cardiac medications before your next appointment, please call your pharmacy*   Lab Work: BMET and magnesium  If you have labs (blood work) drawn today and your tests are completely normal, you will receive your results only by: Marland Kitchen MyChart Message (if you have MyChart) OR . A paper copy in the mail If you have any lab test that is abnormal or we need to change your treatment, we will call you to review the results.   Testing/Procedures: None today   Follow-Up: At [facility], you and your health needs are our priority.  As part of our continuing mission to provide you with exceptional heart care, we have created designated Provider Care Teams.  These Care Teams include your primary Cardiologist (physician) and Advanced Practice Providers (APPs -  Physician Assistants and Nurse Practitioners) who all work together to provide you with the care you need, when you need it.  Your next appointment:   3 month(s)  The format for your next appointment:   In Person  Provider:   Rozann Lesches, MD   Other Instructions None    Thank you for choosing [facility] !

## 2019-12-31 NOTE — Progress Notes (Signed)
Cardiology Office Note  Date: 12/31/2019   ID: Kim Dixon, DOB 1940-04-24, MRN [id number]  PCP:  Asencion Noble, MD  Cardiologist:  Rozann Lesches, MD Electrophysiologist:  None   Chief Complaint  Patient presents with  . Cardiac follow-up    History of Present Illness: Kim Dixon is a 79 y.o. female last seen in September.  She was referred for diagnostic cardiac catheterization which was performed by Dr. Fletcher Anon on September 10.  Study was overall reassuring showing normal coronary arteries.  LVEDP was increased to 24 mmHg.  She presents today for follow-up, overall doing reasonably well.  She has "good days and bad days."  Her weight is down and she is tolerating Demadex at 60 mg daily with potassium supplement.  Sometimes has had for leg cramps.  I reviewed her most recent lab work from September.  The remainder of her medications are unchanged and outlined below.   Past Medical History:  Diagnosis Date  . Anxiety   . Arthritis   . Basal cell carcinoma    RIGHT THIGH BCC CX3 5FU  . Basal cell carcinoma   . Depression   . Diverticulosis   . Essential hypertension   . Glaucoma   . Hematuria   . History of kidney stones   . History of pneumonia   . Hyperlipidemia   . PAF (paroxysmal atrial fibrillation) ([place])   . Pneumonia   . Stress incontinence   . TIA (transient ischemic attack)    January 2020  . Urinary frequency   . Varicose veins     Past Surgical History:  Procedure Laterality Date  . ABDOMINAL HYSTERECTOMY    . ABLATION SAPHENOUS VEIN W/ RFA Bilateral   . COLONOSCOPY    . COLONOSCOPY N/A 06/18/2013   Procedure: COLONOSCOPY;  Surgeon: Rogene Houston, MD;  Location: [facility];  Service: Endoscopy;  Laterality: N/A;  830  . CYSTOSCOPY WITH RETROGRADE PYELOGRAM, URETEROSCOPY AND STENT PLACEMENT Left 10/18/2018   Procedure: CYSTOSCOPY WITH RETROGRADE PYELOGRAM, URETEROSCOPY AND STENT PLACEMENT;  Surgeon: Alexis Frock, MD;  Location: [facility]  ORS;  Service: Urology;  Laterality: Left;  1 HR  . EYE SURGERY     laser surgery bilat   . HOLMIUM LASER APPLICATION Left 4/40/1027   Procedure: HOLMIUM LASER APPLICATION;  Surgeon: Alexis Frock, MD;  Location: [facility] ORS;  Service: Urology;  Laterality: Left;  . LEFT HEART CATH AND CORONARY ANGIOGRAPHY N/A 11/28/2019   Procedure: LEFT HEART CATH AND CORONARY ANGIOGRAPHY;  Surgeon: Wellington Hampshire, MD;  Location: [place] CV LAB;  Service: Cardiovascular;  Laterality: N/A;  . LITHOTRIPSY    . MASS EXCISION N/A 12/07/2017   Procedure: EXCISION 3CM CYST ON BACK;  Surgeon: Aviva Signs, MD;  Location: [facility] ORS;  Service: General;  Laterality: N/A;  . PARATHYROIDECTOMY Left 04/22/2019   Procedure: LEFT INFERIOR PARATHYROIDECTOMY;  Surgeon: Armandina Gemma, MD;  Location: [facility] ORS;  Service: General;  Laterality: Left;  . TONSILLECTOMY    . TOTAL KNEE ARTHROPLASTY Left 08/25/2014   Procedure: LEFT TOTAL KNEE ARTHROPLASTY;  Surgeon: Paralee Cancel, MD;  Location: [facility] ORS;  Service: Orthopedics;  Laterality: Left;    Current Outpatient Medications  Medication Sig Dispense Refill  . Ascorbic Acid (VITAMIN C) 1000 MG tablet Take 3,000 mg by mouth daily.     Marland Kitchen atorvastatin (LIPITOR) 10 MG tablet Take 10 mg by mouth every Monday, Wednesday, and Friday at 8 PM.     . bimatoprost (LUMIGAN) 0.01 %  SOLN Place 1 drop into both eyes at bedtime.    . citalopram (CELEXA) 20 MG tablet Take 20 mg by mouth daily.     Marland Kitchen ELIQUIS 5 MG TABS tablet TAKE ONE TABLET (5MG  TOTAL) BY MOUTH TWICE DAILY (Patient taking differently: Take 5 mg by mouth 2 (two) times daily. ) 60 tablet 11  . losartan (COZAAR) 50 MG tablet Take 1 tablet (50 mg total) by mouth daily. 30 tablet 0  . Menthol, Topical Analgesic, (BIOFREEZE ROLL-ON EX) Apply 1 application topically 3 (three) times daily as needed (back pain.).    Marland Kitchen metoprolol tartrate (LOPRESSOR) 25 MG tablet Take 1 tablet (25 mg total) by mouth 2 (two) times daily. 180 tablet 3  . potassium  chloride SA (KLOR-CON M20) 20 MEQ tablet Take 1 tablet (20 mEq total) by mouth daily. 90 tablet 3  . timolol (TIMOPTIC) 0.5 % ophthalmic solution Place 1 drop into both eyes daily.    Marland Kitchen torsemide (DEMADEX) 20 MG tablet Take 3 tablets (60 mg total) by mouth daily. 270 tablet 3  . Vitamin D3 (VITAMIN D) 25 MCG tablet Take 1,000 Units by mouth daily.     Current Facility-Administered Medications  Medication Dose Route Frequency Provider Last Rate Last Admin  . sodium chloride flush (NS) 0.9 % injection 3 mL  3 mL Intravenous Q12H Satira Sark, MD       Allergies:  Naproxen sodium   ROS: No orthopnea or PND.  Physical Exam: VS:  BP 138/78   Pulse 66   Ht 5' 0.5" (1.537 m)   Wt 268 lb (121.6 kg)   SpO2 96%   BMI 51.48 kg/m , BMI Body mass index is 51.48 kg/m.  Wt Readings from Last 3 Encounters:  12/31/19 268 lb (121.6 kg)  11/28/19 275 lb (124.7 kg)  11/21/19 276 lb (125.2 kg)    General: Patient appears comfortable at rest. HEENT: Conjunctiva and lids normal, wearing a mask. Neck: Supple, no elevated JVP or carotid bruits, no thyromegaly. Lungs: Clear to auscultation, nonlabored breathing at rest. Cardiac: Regular rate and rhythm, no S3 or significant systolic murmur. Extremities: Chronic appearing edema and venous stasis, improved.    ECG:  An ECG dated 09/20/2019 was personally reviewed today and demonstrated:  Sinus bradycardia with low voltage, left atrial enlargement, nonspecific T wave changes.  Recent Labwork: 11/21/2019: Hemoglobin 13.0; Platelets 175 12/18/2019: BUN 15; Creatinine, Ser 0.89; Potassium 3.4; Sodium 138   Other Studies Reviewed Today:  Cardiac catheterization 11/28/2019:  The left ventricular systolic function is normal.  LV end diastolic pressure is moderately elevated.  The left ventricular ejection fraction is 55-65% by visual estimate.   1.  Normal coronary arteries. 2.  Normal LV systolic function.  Mitral annular calcifications noted. 3.   Moderately elevated left ventricular end-diastolic pressure at 24 mmHg.  Echocardiogram 09/30/2019: 1. Left ventricular ejection fraction, by estimation, is 60 to 65%. The  left ventricle has normal function. The left ventricle has no regional  wall motion abnormalities. Left ventricular diastolic parameters were  normal.  2. Right ventricular systolic function is normal. The right ventricular  size is normal.  3. Left atrial size was moderately dilated.  4. The mitral valve is normal in structure. Trivial mitral valve  regurgitation. No evidence of mitral stenosis.  5. The aortic valve is tricuspid. Aortic valve regurgitation is not  visualized. No aortic stenosis is present.  6. The inferior vena cava is dilated in size with >50% respiratory  variability, suggesting  right atrial pressure of 8 mmHg.   Assessment and Plan:  1.  Chronic diastolic heart failure.  Recent cardiac catheterization showed normal coronary arteries with elevated LVEDP.  She is now on Demadex 60 mg daily with potassium supplement.  Weight is down about 7 pounds from last assessment.  Check BMET.  Continue Toprol-XL and losartan.  2.  Atrial fibrillation with CHA2DS2-VASc score of 6.  She remains on Eliquis for stroke prophylaxis, no bleeding problems.  Continue beta-blocker and observation.  Medication Adjustments/Labs and Tests Ordered: Current medicines are reviewed at length with the patient today.  Concerns regarding medicines are outlined above.   Tests Ordered: Orders Placed This Encounter  Procedures  . Basic Metabolic Panel (BMET)  . Magnesium    Medication Changes: Meds ordered this encounter  Medications  . torsemide (DEMADEX) 20 MG tablet    Sig: Take 3 tablets (60 mg total) by mouth daily.    Dispense:  270 tablet    Refill:  3    12/02/19 dose increased to 60 mg qd    Disposition:  Follow up 3 months in the [place] office.  Signed, Satira Sark, MD, [facility] 12/31/2019 2:45  PM    [facility] at [facility] [address] [place], [place] [address] Phone: [phone]; Fax: [phone]

## 2020-01-01 ENCOUNTER — Other Ambulatory Visit: Payer: Self-pay

## 2020-01-01 ENCOUNTER — Other Ambulatory Visit (HOSPITAL_COMMUNITY)
Admission: RE | Admit: 2020-01-01 | Discharge: 2020-01-01 | Disposition: A | Discharge status: Home / Self Care | Payer: Medicare PPO | Source: Ambulatory Visit | Attending: Cardiology | Admitting: Cardiology

## 2020-01-01 DIAGNOSIS — I480 Paroxysmal atrial fibrillation: Secondary | ICD-10-CM | POA: Diagnosis not present

## 2020-01-01 DIAGNOSIS — I5032 Chronic diastolic (congestive) heart failure: Secondary | ICD-10-CM | POA: Diagnosis not present

## 2020-01-01 LAB — BASIC METABOLIC PANEL
Anion gap: 11 (ref 5–15)
BUN: 25 mg/dL — ABNORMAL HIGH (ref 8–23)
CO2: 29 mmol/L (ref 22–32)
Calcium: 9 mg/dL (ref 8.9–10.3)
Chloride: 98 mmol/L (ref 98–111)
Creatinine, Ser: 0.84 mg/dL (ref 0.44–1.00)
GFR, Estimated: >60 mL/min (ref >60)
Glucose, Bld: 121 mg/dL — ABNORMAL HIGH (ref 70–99)
Potassium: 4 mmol/L (ref 3.5–5.1)
Sodium: 138 mmol/L (ref 135–145)

## 2020-01-01 LAB — MAGNESIUM: Magnesium: 2.1 mg/dL (ref 1.7–2.4)

## 2020-01-15 NOTE — Progress Notes (Signed)
Follow-Up Visit   Subjective  Kim Dixon is a 79 y.o. female who presents for the following: Annual Exam (ISK ON CHEST).  Annual skin exam Location:  Duration:  Quality:  Associated Signs/Symptoms: Modifying Factors:  Severity:  Timing: Context:   Objective  Well appearing patient in no apparent distress; mood and affect are within normal limits.  A full examination was performed including scalp, head, eyes, ears, nose, lips, neck, chest, axillae, abdomen, back, buttocks, bilateral upper extremities, bilateral lower extremities, hands, feet, fingers, toes, fingernails, and toenails. All findings within normal limits unless otherwise noted below.  Routine follow-up for Kim Dixon date of birth [birth year].  Her excellent primary care physician Dr.Roy Willey Blade found a spot behind her lower right ear; this pearly 1 cm focally eroded nodule is almost certainly another nonmole skin cancer.  Biopsy obtained and Kim Dixon could check on MyChart or call my office on Monday; surgical follow-up appointment will be scheduled.  The rest of her examination from her lower legs to her scalp is clean.  There is a recurrent cyst on her right upper central back which reportedly had been removed four times previously; I discouraged more surgery on the spot.  She has roughly ten 2 to 3 mm tiny white dots in her scalp which are many cysts called milia which require no intervention.  There are multiple benign keratoses on her back and collarbone area.  There is one keratosis on the central left forehead along with multiple smooth 2 mm bumps called sebaceous hyperplasia; all of these are safe to leave if stable.  On her legs from the knees down are 3 dozen sharp edged 1 cm scaly spots called DSA P.  The risk of these developing skin cancer is not zero but it is low and I discouraged any procedure unless there is obvious clinical change.  There is no recurrence of the skin cancer on her upper leg.  There are no  atypical moles.  Historically there is a spot near the left ear that comes and goes which today is clear.  All questions answered.  We also discussed 2 forms of dermatitis/eczema affecting episodically her hands and feet.  On her feet she sees little itchy bumps that form tiny hard dots that when punctured a little fluid comes out and this helps it to heal.  This history fits a disorder "dyshidrotic" eczema.  The hands will seasonally most often occur in the spring or fall and peels for weeks.  This is likely an environmental trigger but allergy testing or prescription cortisone creams are usually not very helpful.  I will look at these if these flare.   Assessment & Plan    Neoplasm of uncertain behavior of skin Right Anterior Neck  Skin / nail biopsy Type of biopsy: tangential   Informed consent: discussed and consent obtained   Timeout: patient name, date of birth, surgical site, and procedure verified   Anesthesia: the lesion was anesthetized in a standard fashion   Anesthetic:  1% lidocaine w/ epinephrine 1-100,000 local infiltration Instrument used: flexible razor blade   Hemostasis achieved with: ferric subsulfate   Outcome: patient tolerated procedure well   Post-procedure details: sterile dressing applied and wound care instructions given   Dressing type: bandage and petrolatum    Specimen 1 - Surgical pathology Differential Diagnosis: BCC SCC Check Margins: No  Encounter for screening for malignant neoplasm of skin Mid Back  Yearly skin exams  Cyst of skin Mid Back  Benign lesion no treatment needed unless it bothers patient.   Milia Scalp  No treatment needed.   Seborrheic keratosis (2) Neck - Anterior; Mid Back  No treatment needed.   Sebaceous hyperplasia of face Head - Anterior (Face)  Benign no treatment needed.  DSAP (disseminated superficial actinic porokeratosis) (2) Left Lower Leg - Anterior; Right Lower Leg - Anterior  No treatment needed  unless something changes.  Eczema, unspecified type (4) Left Hand - Anterior; Right Hand - Anterior; Left Foot - Anterior; Right Foot - Anterior  Comes and goes believes it could be environmental trigger. No treatment needed. Patient will call if she gets another flare up.      I, Kim Monarch, MD, have reviewed all documentation for this visit.  The documentation on 01/18/20 for the exam, diagnosis, procedures, and orders are all accurate and complete.

## 2020-01-18 ENCOUNTER — Encounter: Payer: Self-pay | Admitting: Dermatology

## 2020-01-18 NOTE — Addendum Note (Signed)
Addended by: [name] on: 01/18/2020 12:23 PM   Modules accepted: Level of Service

## 2020-01-29 ENCOUNTER — Encounter: Payer: Self-pay | Admitting: Dermatology

## 2020-01-29 ENCOUNTER — Other Ambulatory Visit: Payer: Self-pay

## 2020-01-29 ENCOUNTER — Ambulatory Visit (INDEPENDENT_AMBULATORY_CARE_PROVIDER_SITE_OTHER): Payer: Medicare PPO | Admitting: Dermatology

## 2020-01-29 DIAGNOSIS — D485 Neoplasm of uncertain behavior of skin: Secondary | ICD-10-CM

## 2020-01-29 DIAGNOSIS — C4441 Basal cell carcinoma of skin of scalp and neck: Secondary | ICD-10-CM

## 2020-01-29 DIAGNOSIS — C4491 Basal cell carcinoma of skin, unspecified: Secondary | ICD-10-CM

## 2020-01-29 NOTE — Progress Notes (Signed)
13

## 2020-02-05 ENCOUNTER — Telehealth: Payer: Self-pay | Admitting: *Deleted

## 2020-02-05 NOTE — Telephone Encounter (Signed)
-----   Message from [name], MD sent at 02/03/2020  7:05 AM EST ----- Schedule surgery with Dr. Darene Lamer unless base treated at time of biopsy.

## 2020-02-05 NOTE — Telephone Encounter (Signed)
Path to patient lesion treated at the time of biopsy. Patient is doing well with no concerns.

## 2020-02-05 NOTE — Telephone Encounter (Signed)
Left patient a message to call back for pathology results.  ?

## 2020-02-05 NOTE — Telephone Encounter (Signed)
Patient left message on office voice mail saying that she was returning telephone call.

## 2020-02-11 ENCOUNTER — Encounter: Payer: Self-pay | Admitting: Dermatology

## 2020-02-11 NOTE — Progress Notes (Addendum)
   Follow-Up Visit   Subjective  Kim Dixon is a 79 y.o. female who presents for the following: Procedure (sup & nod bcc x 1- right anterior neck).  BCC Location: Right front neck Duration:  Quality:  Associated Signs/Symptoms: Modifying Factors:  Severity:  Timing: Context: Also has an enlarging spot left posterior neck  Objective  Well appearing patient in no apparent distress; mood and affect are within normal limits.  A focused examination was performed including face, neck, chest and back. Relevant physical exam findings are noted in the Assessment and Plan.     Assessment & Plan    Basal cell carcinoma (BCC), unspecified site Right Anterior Neck  Destruction of lesion Complexity: simple   Destruction method: electrodesiccation and curettage   Informed consent: discussed and consent obtained   Timeout:  patient name, date of birth, surgical site, and procedure verified Anesthesia: the lesion was anesthetized in a standard fashion   Anesthetic:  1% lidocaine w/ epinephrine 1-100,000 local infiltration Curettage performed in three different directions: Yes   Curettage cycles:  3 Lesion length (cm):  1.2 Lesion width (cm):  1 Margin per side (cm):  0 Final wound size (cm):  1.2 Hemostasis achieved with:  ferric subsulfate Outcome: patient tolerated procedure well with no complications   Additional details:  Wound innoculated with 5 fluorouracil solution.  Basal cell carcinoma (BCC) of skin of neck Left Posterior Neck  Destruction of lesion Complexity: simple   Destruction method: electrodesiccation and curettage   Informed consent: discussed and consent obtained   Timeout:  patient name, date of birth, surgical site, and procedure verified Anesthesia: the lesion was anesthetized in a standard fashion   Anesthetic:  1% lidocaine w/ epinephrine 1-100,000 local infiltration Curettage performed in three different directions: Yes   Curettage cycles:  3 Lesion  length (cm):  1.3 Lesion width (cm):  1 Margin per side (cm):  0 Final wound size (cm):  1.3 Hemostasis achieved with:  ferric subsulfate Outcome: patient tolerated procedure well with no complications   Post-procedure details: sterile dressing applied and wound care instructions given   Dressing type: bandage and petrolatum    Specimen 1 - Surgical pathology Differential Diagnosis: bcc vs scc Check Margins: No  After shave biopsy base treated with triple curettage plus electrocautery.     I, Kim Monarch, MD, have reviewed all documentation for this visit.  The documentation on 02/22/20 for the exam, diagnosis, procedures, and orders are all accurate and complete.

## 2020-02-22 NOTE — Addendum Note (Signed)
Addended by: [name] on: 02/22/2020 08:26 PM   Modules accepted: Orders

## 2020-04-01 ENCOUNTER — Ambulatory Visit (INDEPENDENT_AMBULATORY_CARE_PROVIDER_SITE_OTHER): Payer: Medicare PPO | Admitting: Cardiology

## 2020-04-01 ENCOUNTER — Encounter: Payer: Self-pay | Admitting: Cardiology

## 2020-04-01 ENCOUNTER — Other Ambulatory Visit: Payer: Self-pay

## 2020-04-01 VITALS — BP 104/58 | HR 51 | Ht 60.0 in | Wt 271.0 lb

## 2020-04-01 DIAGNOSIS — I480 Paroxysmal atrial fibrillation: Secondary | ICD-10-CM | POA: Diagnosis not present

## 2020-04-01 DIAGNOSIS — I5032 Chronic diastolic (congestive) heart failure: Secondary | ICD-10-CM

## 2020-04-01 MED ORDER — APIXABAN 5 MG PO TABS: ORAL_TABLET | ORAL | 3 refills | Status: DC

## 2020-04-01 NOTE — Progress Notes (Signed)
Cardiology Office Note  Date: 04/01/2020   ID: Kim Dixon, DOB 1940-11-13, MRN [id number]  PCP:  Asencion Noble, MD  Cardiologist:  Rozann Lesches, MD Electrophysiologist:  None   Chief Complaint  Patient presents with  . Cardiac follow-up    History of Present Illness: Kim Dixon is a 80 y.o. female last seen in October 2021.  She presents for a routine visit.  Overall no major change in terms of stamina or shortness of breath.  Weight has been stable on current dose of Demadex.  I reviewed her medications which are outlined below.  Blood pressure is well controlled today.  Past Medical History:  Diagnosis Date  . Anxiety   . Arthritis   . Basal cell carcinoma    RIGHT THIGH BCC CX3 5FU  . Basal cell carcinoma 12/16/2019   right anterior neck (CX35FU)  . Depression   . Diverticulosis   . Essential hypertension   . Glaucoma   . Hematuria   . History of kidney stones   . History of pneumonia   . Hyperlipidemia   . PAF (paroxysmal atrial fibrillation) ([place])   . Pneumonia   . Stress incontinence   . TIA (transient ischemic attack)    January 2020  . Urinary frequency   . Varicose veins     Past Surgical History:  Procedure Laterality Date  . ABDOMINAL HYSTERECTOMY    . ABLATION SAPHENOUS VEIN W/ RFA Bilateral   . COLONOSCOPY    . COLONOSCOPY N/A 06/18/2013   Procedure: COLONOSCOPY;  Surgeon: Rogene Houston, MD;  Location: [facility];  Service: Endoscopy;  Laterality: N/A;  830  . CYSTOSCOPY WITH RETROGRADE PYELOGRAM, URETEROSCOPY AND STENT PLACEMENT Left 10/18/2018   Procedure: CYSTOSCOPY WITH RETROGRADE PYELOGRAM, URETEROSCOPY AND STENT PLACEMENT;  Surgeon: Alexis Frock, MD;  Location: [facility];  Service: Urology;  Laterality: Left;  1 HR  . EYE SURGERY     laser surgery bilat   . HOLMIUM LASER APPLICATION Left [id number]   Procedure: HOLMIUM LASER APPLICATION;  Surgeon: Alexis Frock, MD;  Location: [facility];  Service: Urology;  Laterality:  Left;  . LEFT HEART CATH AND CORONARY ANGIOGRAPHY N/A 11/28/2019   Procedure: LEFT HEART CATH AND CORONARY ANGIOGRAPHY;  Surgeon: Wellington Hampshire, MD;  Location: [facility] [facility];  Service: Cardiovascular;  Laterality: N/A;  . LITHOTRIPSY    . MASS EXCISION N/A 12/07/2017   Procedure: EXCISION 3CM CYST ON BACK;  Surgeon: Aviva Signs, MD;  Location: [facility];  Service: General;  Laterality: N/A;  . PARATHYROIDECTOMY Left 04/22/2019   Procedure: LEFT INFERIOR PARATHYROIDECTOMY;  Surgeon: Armandina Gemma, MD;  Location: [facility];  Service: General;  Laterality: Left;  . TONSILLECTOMY    . TOTAL KNEE ARTHROPLASTY Left 08/25/2014   Procedure: LEFT TOTAL KNEE ARTHROPLASTY;  Surgeon: Paralee Cancel, MD;  Location: [facility];  Service: Orthopedics;  Laterality: Left;    Current Outpatient Medications  Medication Sig Dispense Refill  . Ascorbic Acid (VITAMIN C) 1000 MG tablet Take 3,000 mg by mouth daily.     Marland Kitchen atorvastatin (LIPITOR) 10 MG tablet Take 10 mg by mouth every Monday, Wednesday, and Friday at 8 PM.     . bimatoprost (LUMIGAN) 0.01 % SOLN Place 1 drop into both eyes at bedtime.    . citalopram (CELEXA) 20 MG tablet Take 20 mg by mouth daily.     Marland Kitchen losartan (COZAAR) 50 MG tablet Take 1 tablet (50 mg total) by mouth daily. [place]  tablet 0  . Menthol, Topical Analgesic, (BIOFREEZE ROLL-ON EX) Apply 1 application topically 3 (three) times daily as needed (back pain.).    Marland Kitchen metoprolol tartrate (LOPRESSOR) 25 MG tablet Take 1 tablet (25 mg total) by mouth 2 (two) times daily. 180 tablet 3  . potassium chloride SA (KLOR-CON M20) 20 MEQ tablet Take 1 tablet (20 mEq total) by mouth daily. 90 tablet 3  . timolol (TIMOPTIC) 0.5 % ophthalmic solution Place 1 drop into both eyes daily.    Marland Kitchen torsemide (DEMADEX) 20 MG tablet Take 3 tablets (60 mg total) by mouth daily. 270 tablet 3  . Vitamin D3 (VITAMIN D) 25 MCG tablet Take 1,000 Units by mouth daily.    Marland Kitchen apixaban (ELIQUIS) 5 MG TABS tablet TAKE ONE TABLET (5MG   TOTAL) BY MOUTH TWICE DAILY 180 tablet 3   Current Facility-Administered Medications  Medication Dose Route Frequency Provider Last Rate Last Admin  . sodium chloride flush (NS) 0.9 % injection 3 mL  3 mL Intravenous Q12H Satira Sark, MD       Allergies:  Naproxen sodium   ROS: Episode of chills last week, malaise the next day, has been back to baseline since then.  Physical Exam: VS:  BP (!) 104/58   Pulse (!) 51   Ht 5' (1.524 m)   Wt 271 lb (122.9 kg)   SpO2 91%   BMI 52.93 kg/m , BMI Body mass index is 52.93 kg/m.  Wt Readings from Last 3 Encounters:  04/01/20 271 lb (122.9 kg)  12/31/19 268 lb (121.6 kg)  11/28/19 275 lb (124.7 kg)    General: Patient appears comfortable at rest. HEENT: Conjunctiva and lids normal, wearing a mask. Neck: Supple, no elevated JVP or carotid bruits. Lungs: Clear to auscultation, nonlabored breathing at rest. Cardiac: Regular rate and rhythm, no S3 or significant systolic murmur. Extremities: Stable,.  Chronic appearing edema and venous stasis.  ECG:  An ECG dated 11/21/2019 was personally reviewed today and demonstrated:  Sinus bradycardia with low voltage and left atrial enlargement.  Recent Labwork: 11/21/2019: Hemoglobin 13.0; Platelets 175 01/01/2020: BUN 25; Creatinine, Ser 0.84; Magnesium 2.1; Potassium 4.0; Sodium 138   Other Studies Reviewed Today:  Cardiac catheterization 11/28/2019:  The left ventricular systolic function is normal.  LV end diastolic pressure is moderately elevated.  The left ventricular ejection fraction is 55-65% by visual estimate.  1. Normal coronary arteries. 2. Normal LV systolic function. Mitral annular calcifications noted. 3. Moderately elevated left ventricular end-diastolic pressure at 24 mmHg.  Echocardiogram 09/30/2019: 1. Left ventricular ejection fraction, by estimation, is 60 to 65%. The  left ventricle has normal function. The left ventricle has no regional  wall motion  abnormalities. Left ventricular diastolic parameters were  normal.  2. Right ventricular systolic function is normal. The right ventricular  size is normal.  3. Left atrial size was moderately dilated.  4. The mitral valve is normal in structure. Trivial mitral valve  regurgitation. No evidence of mitral stenosis.  5. The aortic valve is tricuspid. Aortic valve regurgitation is not  visualized. No aortic stenosis is present.  6. The inferior vena cava is dilated in size with >50% respiratory  variability, suggesting right atrial pressure of 8 mmHg.   Assessment and Plan:  1. Chronic diastolic heart failure, weight is stable and she is tolerating current dose of Demadex with potassium supplement.  No changes were made today.  LVEF 60 to 65%, and normal coronary arteries.  Also on losartan and Lopressor.  2. Paroxysmal atrial fibrillation with CHA2DS2-VASc score of 6.  She continues on Eliquis for stroke prophylaxis, lab work reviewed above.  No interval palpitations.  Medication Adjustments/Labs and Tests Ordered: Current medicines are reviewed at length with the patient today.  Concerns regarding medicines are outlined above.   Tests Ordered: No orders of the defined types were placed in this encounter.   Medication Changes: Meds ordered this encounter  Medications  . apixaban (ELIQUIS) 5 MG TABS tablet    Sig: TAKE ONE TABLET (5MG  TOTAL) BY MOUTH TWICE DAILY    Dispense:  180 tablet    Refill:  3    Disposition:  Follow up 6 months in the [place] office.  Signed, Satira Sark, MD, [facility] 04/01/2020 11:58 AM    [facility] [place] [facility] [place], [place], [place] [address] Phone: [phone]; Fax: [phone]

## 2020-04-01 NOTE — Patient Instructions (Addendum)
Medication Instructions:  Your physician recommends that you continue on your current medications as directed. Please refer to the Current Medication list given to you today.  Labwork: none  Testing/Procedures: none  Follow-Up: Your physician recommends that you schedule a follow-up appointment in: 6 months at the [place] office  Any Other Special Instructions Will Be Listed Below (If Applicable).  If you need a refill on your cardiac medications before your next appointment, please call your pharmacy. 

## 2020-04-28 DIAGNOSIS — H25813 Combined forms of age-related cataract, bilateral: Secondary | ICD-10-CM | POA: Diagnosis not present

## 2020-04-28 DIAGNOSIS — H401133 Primary open-angle glaucoma, bilateral, severe stage: Secondary | ICD-10-CM | POA: Diagnosis not present

## 2020-05-03 ENCOUNTER — Encounter: Payer: Self-pay | Admitting: Dermatology

## 2020-05-03 ENCOUNTER — Other Ambulatory Visit: Payer: Self-pay

## 2020-05-03 ENCOUNTER — Ambulatory Visit (INDEPENDENT_AMBULATORY_CARE_PROVIDER_SITE_OTHER): Payer: Medicare PPO | Admitting: Dermatology

## 2020-05-03 DIAGNOSIS — Z1283 Encounter for screening for malignant neoplasm of skin: Secondary | ICD-10-CM

## 2020-05-03 DIAGNOSIS — Z85828 Personal history of other malignant neoplasm of skin: Secondary | ICD-10-CM

## 2020-05-15 ENCOUNTER — Encounter: Payer: Self-pay | Admitting: Dermatology

## 2020-05-15 NOTE — Progress Notes (Signed)
   Follow-Up Visit   Subjective  Kim Dixon is a 80 y.o. female who presents for the following: Follow-up (Right anterior neck & left posterior neck- no concerns- healing good).  Check area on neck for basal cell was treated plus recheck moles Location:  Duration:  Quality:  Associated Signs/Symptoms: Modifying Factors:  Severity:  Timing: Context:   Objective  Well appearing patient in no apparent distress; mood and affect are within normal limits. Objective  Left Posterior Neck: No sign recurrence  Objective  Neck - Anterior: Waist up skin examination-no atypical moles or non mole skin cancer    All skin waist up examined.   Assessment & Plan    History of basal cell cancer Left Posterior Neck  Yearly skin check  Encounter for screening for malignant neoplasm of skin Neck - Anterior  Yearly skin check     I, [name], MD, have reviewed all documentation for this visit.  The documentation on 05/15/20 for the exam, diagnosis, procedures, and orders are all accurate and complete.

## 2020-06-08 ENCOUNTER — Emergency Department (HOSPITAL_COMMUNITY)
Admission: EM | Admit: 2020-06-08 | Discharge: 2020-06-08 | Disposition: A | Discharge status: Home / Self Care | Payer: Medicare PPO | Attending: Emergency Medicine | Admitting: Emergency Medicine

## 2020-06-08 ENCOUNTER — Encounter (HOSPITAL_COMMUNITY): Payer: Self-pay | Admitting: Emergency Medicine

## 2020-06-08 ENCOUNTER — Emergency Department (HOSPITAL_COMMUNITY): Payer: Medicare PPO

## 2020-06-08 ENCOUNTER — Other Ambulatory Visit: Payer: Self-pay

## 2020-06-08 DIAGNOSIS — Z7901 Long term (current) use of anticoagulants: Secondary | ICD-10-CM | POA: Insufficient documentation

## 2020-06-08 DIAGNOSIS — R251 Tremor, unspecified: Secondary | ICD-10-CM | POA: Insufficient documentation

## 2020-06-08 DIAGNOSIS — Z79899 Other long term (current) drug therapy: Secondary | ICD-10-CM | POA: Diagnosis not present

## 2020-06-08 DIAGNOSIS — Z87891 Personal history of nicotine dependence: Secondary | ICD-10-CM | POA: Diagnosis not present

## 2020-06-08 DIAGNOSIS — Z20822 Contact with and (suspected) exposure to covid-19: Secondary | ICD-10-CM | POA: Insufficient documentation

## 2020-06-08 DIAGNOSIS — I10 Essential (primary) hypertension: Secondary | ICD-10-CM | POA: Diagnosis not present

## 2020-06-08 DIAGNOSIS — Z96652 Presence of left artificial knee joint: Secondary | ICD-10-CM | POA: Diagnosis not present

## 2020-06-08 DIAGNOSIS — I4891 Unspecified atrial fibrillation: Secondary | ICD-10-CM

## 2020-06-08 DIAGNOSIS — R569 Unspecified convulsions: Secondary | ICD-10-CM | POA: Diagnosis not present

## 2020-06-08 DIAGNOSIS — Z8673 Personal history of transient ischemic attack (TIA), and cerebral infarction without residual deficits: Secondary | ICD-10-CM | POA: Insufficient documentation

## 2020-06-08 DIAGNOSIS — I4901 Ventricular fibrillation: Secondary | ICD-10-CM | POA: Insufficient documentation

## 2020-06-08 DIAGNOSIS — Z85828 Personal history of other malignant neoplasm of skin: Secondary | ICD-10-CM | POA: Diagnosis not present

## 2020-06-08 LAB — CBC WITH DIFFERENTIAL/PLATELET
Abs Immature Granulocytes: 0.01 10*3/uL (ref 0.00–0.07)
Basophils Absolute: 0 10*3/uL (ref 0.0–0.1)
Basophils Relative: 1 %
Eosinophils Absolute: 0.1 10*3/uL (ref 0.0–0.5)
Eosinophils Relative: 1 %
HCT: 45.1 % (ref 36.0–46.0)
Hemoglobin: 14.4 g/dL (ref 12.0–15.0)
Immature Granulocytes: 0 %
Lymphocytes Relative: 20 %
Lymphs Abs: 1.4 10*3/uL (ref 0.7–4.0)
MCH: 32.1 pg (ref 26.0–34.0)
MCHC: 31.9 g/dL (ref 30.0–36.0)
MCV: 100.7 fL — ABNORMAL HIGH (ref 80.0–100.0)
Monocytes Absolute: 0.6 10*3/uL (ref 0.1–1.0)
Monocytes Relative: 9 %
Neutro Abs: 5 10*3/uL (ref 1.7–7.7)
Neutrophils Relative %: 69 %
Platelets: 185 10*3/uL (ref 150–400)
RBC: 4.48 MIL/uL (ref 3.87–5.11)
RDW: 14 % (ref 11.5–15.5)
WBC: 7.2 10*3/uL (ref 4.0–10.5)
nRBC: 0 % (ref 0.0–0.2)

## 2020-06-08 LAB — COMPREHENSIVE METABOLIC PANEL
ALT: 15 U/L (ref 0–44)
AST: 17 U/L (ref 15–41)
Albumin: 3.7 g/dL (ref 3.5–5.0)
Alkaline Phosphatase: 60 U/L (ref 38–126)
Anion gap: 9 (ref 5–15)
BUN: 18 mg/dL (ref 8–23)
CO2: 31 mmol/L (ref 22–32)
Calcium: 9.1 mg/dL (ref 8.9–10.3)
Chloride: 98 mmol/L (ref 98–111)
Creatinine, Ser: 0.93 mg/dL (ref 0.44–1.00)
GFR, Estimated: >60 mL/min (ref >60)
Glucose, Bld: 164 mg/dL — ABNORMAL HIGH (ref 70–99)
Potassium: 3.5 mmol/L (ref 3.5–5.1)
Sodium: 138 mmol/L (ref 135–145)
Total Bilirubin: 0.9 mg/dL (ref 0.3–1.2)
Total Protein: 7 g/dL (ref 6.5–8.1)

## 2020-06-08 LAB — RESP PANEL BY RT-PCR (FLU A&B, COVID) ARPGX2
Influenza A by PCR: NEGATIVE
Influenza B by PCR: NEGATIVE
SARS Coronavirus 2 by RT PCR: NEGATIVE

## 2020-06-08 LAB — CBG MONITORING, ED: Glucose-Capillary: 186 mg/dL — ABNORMAL HIGH (ref 70–99)

## 2020-06-08 MED ORDER — SODIUM CHLORIDE 0.9 % IV BOLUS
500.0000 mL | Freq: Once | INTRAVENOUS | Status: AC
  Administered 2020-06-08: 500 mL via INTRAVENOUS

## 2020-06-08 NOTE — ED Provider Notes (Signed)
[facility] Provider Note   CSN: [id number] Arrival date & time: 06/08/20  1615     History No chief complaint on file.   Kim Dixon is a 80 y.o. female.  HPI She presents for evaluation of intermittent seizure-like activity by report.  She had an episode at triage, while being assessed and was responsive during that activity.  Patient recalls the activities, but does not remember when she had the first one today that she could not speak to her daughter.  Daughter in the room with patient at this time and states that the patient had 30 seconds of shaking in bilateral arms, around 11 AM today, during which time she did not respond.  She seemed awake, but did not talk until the shaking stopped when she immediately began to be able to talk and converse.  She has had multiple other episodes just lasting a few seconds since that time today.  She also had a similar episode several months ago that was not evaluated at that time.  She does not have a history of seizure disorder.  She denies headache, neck pain, back pain, nausea, vomiting, fever, chills, cough, shortness of breath, weakness, paresthesia or dizziness.  She is taking her usual medications.  There are no other known modifying factors.    Past Medical History:  Diagnosis Date  . Anxiety   . Arthritis   . Basal cell carcinoma    RIGHT THIGH BCC CX3 5FU  . Basal cell carcinoma 12/16/2019   right anterior neck (CX35FU)  . Depression   . Diverticulosis   . Essential hypertension   . Glaucoma   . Hematuria   . History of kidney stones   . History of pneumonia   . Hyperlipidemia   . PAF (paroxysmal atrial fibrillation) ([place])   . Pneumonia   . Stress incontinence   . TIA (transient ischemic attack)    January 2020  . Urinary frequency   . Varicose veins     Patient Active Problem List   Diagnosis Date Noted  . Accelerating angina ([place])   . Hyperparathyroidism, primary ([place]) 04/13/2019  . TIA  (transient ischemic attack) 06/04/2018  . Sebaceous cyst   . Obese 08/26/2014  . S/P left TKA 08/25/2014  . S/P knee replacement 08/25/2014  . Varicose veins of leg with complications 99/24/2683  . FATIGUE / MALAISE 12/29/2008  . DYSPNEA 12/29/2008  . ABNORMAL CV (STRESS) TEST 12/29/2008  . DEPRESSION 12/21/2008  . HYPERTENSION 12/21/2008    Past Surgical History:  Procedure Laterality Date  . ABDOMINAL HYSTERECTOMY    . ABLATION SAPHENOUS VEIN W/ RFA Bilateral   . COLONOSCOPY    . COLONOSCOPY N/A 06/18/2013   Procedure: COLONOSCOPY;  Surgeon: Rogene Houston, MD;  [facility];  Service: Endoscopy;  Laterality: N/A;  830  . CYSTOSCOPY WITH RETROGRADE PYELOGRAM, URETEROSCOPY AND STENT PLACEMENT Left 10/18/2018   Procedure: CYSTOSCOPY WITH RETROGRADE PYELOGRAM, URETEROSCOPY AND STENT PLACEMENT;  Surgeon: Alexis Frock, MD;  Location: [facility];  Service: Urology;  Laterality: Left;  1 HR  . EYE SURGERY     laser surgery bilat   . HOLMIUM LASER APPLICATION Left 07/06/6220   Procedure: HOLMIUM LASER APPLICATION;  Surgeon: Alexis Frock, MD;  Location: [facility];  Service: Urology;  Laterality: Left;  . LEFT HEART CATH AND CORONARY ANGIOGRAPHY N/A 11/28/2019   Procedure: LEFT HEART CATH AND CORONARY ANGIOGRAPHY;  Surgeon: Wellington Hampshire, MD;  Location: [facility];  Service: Cardiovascular;  Laterality: N/A;  . LITHOTRIPSY    . MASS EXCISION N/A 12/07/2017   Procedure: EXCISION 3CM CYST ON BACK;  Surgeon: Aviva Signs, MD;  Location: [facility];  Service: General;  Laterality: N/A;  . PARATHYROIDECTOMY Left 04/22/2019   Procedure: LEFT INFERIOR PARATHYROIDECTOMY;  Surgeon: Armandina Gemma, MD;  Location: [facility];  Service: General;  Laterality: Left;  . TONSILLECTOMY    . TOTAL KNEE ARTHROPLASTY Left 08/25/2014   Procedure: LEFT TOTAL KNEE ARTHROPLASTY;  Surgeon: Paralee Cancel, MD;  Location: [facility];  Service: Orthopedics;  Laterality: Left;     OB History   No obstetric history  on file.     Family History  Problem Relation Age of Onset  . Coronary artery disease Mother   . Lung cancer Father   . Colon cancer Neg Hx   . Stomach cancer Neg Hx     Social History   Tobacco Use  . Smoking status: Former Smoker    Packs/day: 3.00    Years: 30.00    Pack years: 90.00    Types: Cigarettes    Quit date: 04/04/1993    Years since quitting: 27.1  . Smokeless tobacco: Never Used  Vaping Use  . Vaping Use: Never used  Substance Use Topics  . Alcohol use: Yes    Comment: Nightly glass of wine  . Drug use: No    Home Medications Prior to Admission medications   Medication Sig Start Date End Date Taking? Authorizing Provider  apixaban (ELIQUIS) 5 MG TABS tablet TAKE ONE TABLET (5MG  TOTAL) BY MOUTH TWICE DAILY 04/01/20  Yes Satira Sark, MD  Ascorbic Acid (VITAMIN C) 1000 MG tablet Take 3,000 mg by mouth daily.    Yes [provider]  atorvastatin (LIPITOR) 10 MG tablet Take 10 mg by mouth every Monday, Wednesday, and Friday at 8 PM.    Yes [provider]  bimatoprost (LUMIGAN) 0.01 % SOLN Place 1 drop into both eyes at bedtime.   Yes [provider]  citalopram (CELEXA) 20 MG tablet Take 20 mg by mouth daily.    Yes [provider]  losartan (COZAAR) 100 MG tablet Take 100 mg by mouth daily. 04/19/20  Yes [provider]  Menthol, Topical Analgesic, (BIOFREEZE ROLL-ON EX) Apply 1 application topically 3 (three) times daily as needed (back pain.).   Yes [provider]  metoprolol tartrate (LOPRESSOR) 25 MG tablet Take 1 tablet (25 mg total) by mouth 2 (two) times daily. 11/21/19 02/19/20 Yes Satira Sark, MD  potassium chloride SA (KLOR-CON M20) 20 MEQ tablet Take 1 tablet (20 mEq total) by mouth daily. 12/18/19 03/17/20 Yes Satira Sark, MD  timolol (TIMOPTIC) 0.5 % ophthalmic solution Place 1 drop into both eyes daily. 10/23/17  Yes [provider]  torsemide (DEMADEX) 20 MG tablet Take 3  tablets (60 mg total) by mouth daily. 12/31/19  Yes Satira Sark, MD  Vitamin D3 (VITAMIN D) 25 MCG tablet Take 1,000 Units by mouth daily.   Yes [provider]  losartan (COZAAR) 50 MG tablet Take 1 tablet (50 mg total) by mouth daily. Patient not taking: Reported on 06/08/2020 09/18/19   Evalee Jefferson, PA-C    Allergies    Naproxen sodium  Review of Systems   Review of Systems  All other systems reviewed and are negative.   Physical Exam Updated Vital Signs BP 114/87   Pulse (!) 124   Temp 97.9 F (36.6 C) (Oral)  Resp (!) 23   SpO2 94%   Physical Exam Vitals and nursing note reviewed.  Constitutional:      Appearance: She is well-developed.  HENT:     Head: Normocephalic and atraumatic.     Right Ear: External ear normal.     Left Ear: External ear normal.     Mouth/Throat:     Mouth: Mucous membranes are moist.     Pharynx: No oropharyngeal exudate or posterior oropharyngeal erythema.  Eyes:     Conjunctiva/sclera: Conjunctivae normal.     Pupils: Pupils are equal, round, and reactive to light.  Neck:     Trachea: Phonation normal.  Cardiovascular:     Rate and Rhythm: Normal rate and regular rhythm.     Heart sounds: Normal heart sounds.  Pulmonary:     Effort: Pulmonary effort is normal.     Breath sounds: Normal breath sounds.  Abdominal:     Palpations: Abdomen is soft.     Tenderness: There is no abdominal tenderness.  Musculoskeletal:        General: Normal range of motion.     Cervical back: Normal range of motion and neck supple.     Comments: Normal strength arms and legs bilaterally.  Skin:    General: Skin is warm and dry.  Neurological:     Mental Status: She is alert and oriented to person, place, and time.     Cranial Nerves: No cranial nerve deficit.     Sensory: No sensory deficit.     Motor: No abnormal muscle tone.     Coordination: Coordination normal.     Comments: No dysarthria or aphasia.  No nystagmus.  Normal  finger-to-nose bilaterally.  Psychiatric:        Mood and Affect: Mood normal.        Behavior: Behavior normal.        Thought Content: Thought content normal.        Judgment: Judgment normal.     ED Results / Procedures / Treatments   Labs (all labs ordered are listed, but only abnormal results are displayed) Labs Reviewed  COMPREHENSIVE METABOLIC PANEL - Abnormal; Notable for the following components:      Result Value   Glucose, Bld 164 (*)    All other components within normal limits  CBC WITH DIFFERENTIAL/PLATELET - Abnormal; Notable for the following components:   MCV 100.7 (*)    All other components within normal limits  CBG MONITORING, ED - Abnormal; Notable for the following components:   Glucose-Capillary 186 (*)    All other components within normal limits  RESP PANEL BY RT-PCR (FLU A&B, COVID) ARPGX2  URINALYSIS, ROUTINE W REFLEX MICROSCOPIC    EKG EKG Interpretation  Date/Time:  Tuesday June 08 2020 16:38:35 EDT Ventricular Rate:  123 PR Interval:    QRS Duration: 76 QT Interval:  566 QTC Calculation: 810 R Axis:   128 Text Interpretation:  Critical Test Result: Long QTc Atrial flutter Right axis deviation Low voltage QRS Cannot rule out Anterior infarct , age undetermined ST & T wave abnormality, consider inferior ischemia Abnormal ECG Since last tracing QT has lengthened Otherwise no significant change Confirmed by Daleen Bo [phone]) on 06/08/2020 5:02:12 PM    Radiology CT Head Wo Contrast  Result Date: 06/08/2020 CLINICAL DATA:  Seizure EXAM: CT HEAD WITHOUT CONTRAST TECHNIQUE: Contiguous axial images were obtained from the base of the skull through the vertex without intravenous contrast. COMPARISON:  None. FINDINGS: Brain: There  is atrophy and chronic small vessel disease changes. No acute intracranial abnormality. Specifically, no hemorrhage, hydrocephalus, mass lesion, acute infarction, or significant intracranial injury. Vascular: No hyperdense  vessel or unexpected calcification. Skull: No acute calvarial abnormality. Sinuses/Orbits: No acute findings Other: None IMPRESSION: Atrophy, chronic microvascular disease. No acute intracranial abnormality. Electronically Signed   By: Rolm Baptise M.D.   On: 06/08/2020 18:10    Procedures Procedures   Medications Ordered in ED Medications  sodium chloride 0.9 % bolus 500 mL (500 mLs Intravenous New Bag/Given 06/08/20 1731)    ED Course  I have reviewed the triage vital signs and the nursing notes.  Pertinent labs & imaging results that were available during my care of the patient were reviewed by me and considered in my medical decision making (see chart for details).    MDM Rules/Calculators/A&P                           Patient Vitals for the past 24 hrs:  BP Temp Temp src Pulse Resp SpO2  06/08/20 2005 114/87 -- -- (!) 124 (!) 23 94 %  06/08/20 1830 98/70 -- -- (!) 131 16 93 %  06/08/20 1800 103/73 -- -- (!) 123 20 94 %  06/08/20 1730 (!) 116/103 -- -- -- (!) 28 --  06/08/20 1700 113/71 -- -- (!) 147 (!) 21 92 %  06/08/20 1655 -- 97.9 F (36.6 C) Oral -- -- --  06/08/20 1636 112/61 -- -- (!) 125 18 95 %    8:49 PM Reevaluation with update and discussion. After initial assessment and treatment, an updated evaluation reveals no shaking episodes in the ED.  She continues to have heart rate of 130 after IV fluids, has not yet taken her evening metoprolol.  I offered to give her a dose but she declined it.  Findings discussed with patient and daughter, questions answered. Daleen Bo   Medical Decision Making:  This patient is presenting for evaluation of nonspecific shaking episodes which started today and occurred multiple times., which does require a range of treatment options, and is a complaint that involves a moderate risk of morbidity and mortality. The differential diagnoses include seizure disorder, nonspecific myoclonic activity, cardiac disorder. I decided to review  old records, and in summary elderly female presenting with episodes of shaking during which she does not lose consciousness EPIC, go spontaneously.  I obtained additional historical information from daughter at bedside who witnessed the episodes today.  Clinical Laboratory Tests Ordered, included CBC, Metabolic panel and Covid test, flu to. Review indicates normal. Radiologic Tests Ordered, included CT head.  I independently Visualized: Radiographic images, which show no acute abnormality  Cardiac Monitor Tracing which shows atrial fibrillation with RVR   Critical Interventions-clinical evaluation, IV fluids, laboratory testing, CT imaging, observation reassessment  After These Interventions, the Patient was reevaluated and was found stable for discharge patient with atypical shaking episodes unlikely to represent seizure disorder.  Mild atrial fibrillation with normal blood pressure.  Patient prefers to be discharged to take her home medicines and will follow up with neurology for evaluation of the shaking episodes.  I do not believe that they are associated with atrial fibrillation.  CRITICAL CARE-no Performed by: Daleen Bo  Nursing Notes Reviewed/ Care Coordinated Applicable Imaging Reviewed Interpretation of Laboratory Data incorporated into ED treatment  The patient appears reasonably screened and/or stabilized for discharge and I doubt any other medical condition or other [facility] requiring further  screening, evaluation, or treatment in the ED at this time prior to discharge.  Plan: Home Medications-continue usual; Home Treatments-rest, fluids; return here if the recommended treatment, does not improve the symptoms; Recommended follow up-neurology for checkup as soon as possible.  PCP, PRN     Final Clinical Impression(s) / ED Diagnoses Final diagnoses:  Episode of shaking  Atrial fibrillation with RVR [facility])    Rx / DC Orders ED Discharge Orders    None       Daleen Bo, MD 06/08/20 2056

## 2020-06-08 NOTE — ED Triage Notes (Signed)
Pt to the ED with seizure like activity where her hands tremor and she feels a warm sensation all over her body.  Pt alert and orient x4 while having one of these episodes during triage. BP reading were low in the right arm, last taken in the left.

## 2020-06-08 NOTE — Discharge Instructions (Addendum)
Take all of your medicines as directed as soon as possible when you get home.  Make sure you are eating a heart healthy diet and drinking plenty of fluids, especially water.  Call the neurologist for a follow-up appointment to be seen as soon as possible about the shaking episodes.  Try to get plenty of rest and avoid caffeine.

## 2020-06-15 DIAGNOSIS — E669 Obesity, unspecified: Secondary | ICD-10-CM | POA: Diagnosis not present

## 2020-06-15 DIAGNOSIS — I4891 Unspecified atrial fibrillation: Secondary | ICD-10-CM | POA: Diagnosis not present

## 2020-06-15 DIAGNOSIS — G5603 Carpal tunnel syndrome, bilateral upper limbs: Secondary | ICD-10-CM | POA: Diagnosis not present

## 2020-06-15 DIAGNOSIS — G40309 Generalized idiopathic epilepsy and epileptic syndromes, not intractable, without status epilepticus: Secondary | ICD-10-CM | POA: Diagnosis not present

## 2020-07-06 ENCOUNTER — Telehealth: Payer: Self-pay | Admitting: Cardiology

## 2020-07-06 NOTE — Telephone Encounter (Signed)
Advised that its probably not a cardiac cause for the tremor and that seeing the neurologist should help narrow down the causes. Very appreciative for call back.

## 2020-07-06 NOTE — Telephone Encounter (Signed)
Pt would like to speak w/ Dr. Myles Gip nurse concerning some episodes she has had w/ tremors. She's seen a neurologist and is scheduled to have an EEG in May. Would like to know if they could be heart related.   Please call [phone]

## 2020-07-09 DIAGNOSIS — N390 Urinary tract infection, site not specified: Secondary | ICD-10-CM | POA: Diagnosis not present

## 2020-08-11 DIAGNOSIS — G40209 Localization-related (focal) (partial) symptomatic epilepsy and epileptic syndromes with complex partial seizures, not intractable, without status epilepticus: Secondary | ICD-10-CM | POA: Diagnosis not present

## 2020-08-17 DIAGNOSIS — I4891 Unspecified atrial fibrillation: Secondary | ICD-10-CM | POA: Diagnosis not present

## 2020-08-17 DIAGNOSIS — E669 Obesity, unspecified: Secondary | ICD-10-CM | POA: Diagnosis not present

## 2020-08-17 DIAGNOSIS — R569 Unspecified convulsions: Secondary | ICD-10-CM | POA: Diagnosis not present

## 2020-08-17 DIAGNOSIS — G5603 Carpal tunnel syndrome, bilateral upper limbs: Secondary | ICD-10-CM | POA: Diagnosis not present

## 2020-08-26 ENCOUNTER — Other Ambulatory Visit (HOSPITAL_COMMUNITY): Payer: Self-pay | Admitting: Neurology

## 2020-08-26 ENCOUNTER — Other Ambulatory Visit: Payer: Self-pay | Admitting: Neurology

## 2020-08-26 DIAGNOSIS — R569 Unspecified convulsions: Secondary | ICD-10-CM

## 2020-08-30 DIAGNOSIS — H401133 Primary open-angle glaucoma, bilateral, severe stage: Secondary | ICD-10-CM | POA: Diagnosis not present

## 2020-08-30 DIAGNOSIS — H25813 Combined forms of age-related cataract, bilateral: Secondary | ICD-10-CM | POA: Diagnosis not present

## 2020-09-10 ENCOUNTER — Other Ambulatory Visit: Payer: Self-pay

## 2020-09-10 ENCOUNTER — Ambulatory Visit (HOSPITAL_COMMUNITY)
Admission: RE | Admit: 2020-09-10 | Discharge: 2020-09-10 | Disposition: A | Discharge status: Home / Self Care | Payer: Medicare PPO | Source: Ambulatory Visit | Attending: Neurology | Admitting: Neurology

## 2020-09-10 DIAGNOSIS — R569 Unspecified convulsions: Secondary | ICD-10-CM

## 2020-09-10 MED ORDER — GADOBUTROL 1 MMOL/ML IV SOLN
10.0000 mL | Freq: Once | INTRAVENOUS | Status: AC | PRN
  Administered 2020-09-10: 10 mL via INTRAVENOUS

## 2020-10-12 DIAGNOSIS — I10 Essential (primary) hypertension: Secondary | ICD-10-CM | POA: Diagnosis not present

## 2020-10-12 DIAGNOSIS — E6609 Other obesity due to excess calories: Secondary | ICD-10-CM | POA: Diagnosis not present

## 2020-10-12 DIAGNOSIS — E210 Primary hyperparathyroidism: Secondary | ICD-10-CM | POA: Diagnosis not present

## 2020-10-12 DIAGNOSIS — F329 Major depressive disorder, single episode, unspecified: Secondary | ICD-10-CM | POA: Diagnosis not present

## 2020-10-12 DIAGNOSIS — I480 Paroxysmal atrial fibrillation: Secondary | ICD-10-CM | POA: Diagnosis not present

## 2020-10-12 DIAGNOSIS — Z79899 Other long term (current) drug therapy: Secondary | ICD-10-CM | POA: Diagnosis not present

## 2020-10-12 DIAGNOSIS — R001 Bradycardia, unspecified: Secondary | ICD-10-CM | POA: Diagnosis not present

## 2020-10-12 DIAGNOSIS — H409 Unspecified glaucoma: Secondary | ICD-10-CM | POA: Diagnosis not present

## 2020-10-12 DIAGNOSIS — M1990 Unspecified osteoarthritis, unspecified site: Secondary | ICD-10-CM | POA: Diagnosis not present

## 2020-10-18 DIAGNOSIS — E785 Hyperlipidemia, unspecified: Secondary | ICD-10-CM | POA: Diagnosis not present

## 2020-10-18 DIAGNOSIS — G4089 Other seizures: Secondary | ICD-10-CM | POA: Diagnosis not present

## 2020-10-18 DIAGNOSIS — R7309 Other abnormal glucose: Secondary | ICD-10-CM | POA: Diagnosis not present

## 2020-10-18 DIAGNOSIS — G4733 Obstructive sleep apnea (adult) (pediatric): Secondary | ICD-10-CM | POA: Diagnosis not present

## 2020-10-18 DIAGNOSIS — E1122 Type 2 diabetes mellitus with diabetic chronic kidney disease: Secondary | ICD-10-CM | POA: Diagnosis not present

## 2020-10-19 DIAGNOSIS — I4891 Unspecified atrial fibrillation: Secondary | ICD-10-CM | POA: Diagnosis not present

## 2020-10-19 DIAGNOSIS — G40209 Localization-related (focal) (partial) symptomatic epilepsy and epileptic syndromes with complex partial seizures, not intractable, without status epilepticus: Secondary | ICD-10-CM | POA: Diagnosis not present

## 2020-10-19 DIAGNOSIS — G5603 Carpal tunnel syndrome, bilateral upper limbs: Secondary | ICD-10-CM | POA: Diagnosis not present

## 2020-10-19 DIAGNOSIS — E669 Obesity, unspecified: Secondary | ICD-10-CM | POA: Diagnosis not present

## 2020-10-22 ENCOUNTER — Other Ambulatory Visit (HOSPITAL_BASED_OUTPATIENT_CLINIC_OR_DEPARTMENT_OTHER): Payer: Self-pay

## 2020-10-22 DIAGNOSIS — R5383 Other fatigue: Secondary | ICD-10-CM

## 2020-10-22 DIAGNOSIS — R0681 Apnea, not elsewhere classified: Secondary | ICD-10-CM

## 2020-10-26 ENCOUNTER — Other Ambulatory Visit: Payer: Self-pay

## 2020-10-26 ENCOUNTER — Ambulatory Visit: Payer: Medicare PPO | Attending: Internal Medicine | Admitting: Neurology

## 2020-10-26 DIAGNOSIS — Z79899 Other long term (current) drug therapy: Secondary | ICD-10-CM | POA: Insufficient documentation

## 2020-10-26 DIAGNOSIS — G4733 Obstructive sleep apnea (adult) (pediatric): Secondary | ICD-10-CM | POA: Diagnosis not present

## 2020-10-26 DIAGNOSIS — R0683 Snoring: Secondary | ICD-10-CM | POA: Diagnosis present

## 2020-10-26 DIAGNOSIS — R5383 Other fatigue: Secondary | ICD-10-CM

## 2020-10-26 DIAGNOSIS — R0681 Apnea, not elsewhere classified: Secondary | ICD-10-CM

## 2020-10-26 DIAGNOSIS — G4761 Periodic limb movement disorder: Secondary | ICD-10-CM | POA: Diagnosis not present

## 2020-10-26 DIAGNOSIS — Z7901 Long term (current) use of anticoagulants: Secondary | ICD-10-CM | POA: Insufficient documentation

## 2020-10-27 NOTE — Procedures (Signed)
Celina A. Merlene Laughter, MD     [email or web address]             NOCTURNAL POLYSOMNOGRAPHY   LOCATION: [facility]   Patient Name: Kim Dixon, Kim Dixon Date: 10/26/2020 Gender: Female D.O.B: [birth year] Age (years): 79 Referring Provider: Asencion Noble Height (inches): 64 Interpreting Physician: Phillips Odor MD, ABSM Weight (lbs): 263 RPSGT: Rosebud Poles BMI: 46 MRN: [id number] Neck Size: 16.00 CLINICAL INFORMATION Sleep Study Type: NPSG     Indication for sleep study: Fatigue, Witnessed Apneas     Epworth Sleepiness Score: 9     SLEEP STUDY TECHNIQUE As per the AASM Manual for the Scoring of Sleep and Associated Events v2.3 (April 2016) with a hypopnea requiring 4% desaturations.  The channels recorded and monitored were frontal, central and occipital EEG, electrooculogram (EOG), submentalis EMG (chin), nasal and oral airflow, thoracic and abdominal wall motion, anterior tibialis EMG, snore microphone, electrocardiogram, and pulse oximetry.  MEDICATIONS Medications self-administered by patient taken the night of the study : N/A  Current Outpatient Medications:    apixaban (ELIQUIS) 5 MG TABS tablet, TAKE ONE TABLET ('5MG'$  TOTAL) BY MOUTH TWICE DAILY, Disp: 180 tablet, Rfl: 3   Ascorbic Acid (VITAMIN C) 1000 MG tablet, Take 3,000 mg by mouth daily. , Disp: , Rfl:    atorvastatin (LIPITOR) 10 MG tablet, Take 10 mg by mouth every Monday, Wednesday, and Friday at 8 PM. , Disp: , Rfl:    bimatoprost (LUMIGAN) 0.01 % SOLN, Place 1 drop into both eyes at bedtime., Disp: , Rfl:    citalopram (CELEXA) 20 MG tablet, Take 20 mg by mouth daily. , Disp: , Rfl:    losartan (COZAAR) 100 MG tablet, Take 100 mg by mouth daily., Disp: , Rfl:    losartan (COZAAR) 50 MG tablet, Take 1 tablet (50 mg total) by mouth daily. (Patient not taking: Reported on 06/08/2020), Disp: 30 tablet, Rfl: 0   Menthol, Topical Analgesic, (BIOFREEZE ROLL-ON EX), Apply 1 application  topically 3 (three) times daily as needed (back pain.)., Disp: , Rfl:    metoprolol tartrate (LOPRESSOR) 25 MG tablet, Take 1 tablet (25 mg total) by mouth 2 (two) times daily., Disp: 180 tablet, Rfl: 3   potassium chloride SA (KLOR-CON M20) 20 MEQ tablet, Take 1 tablet (20 mEq total) by mouth daily., Disp: 90 tablet, Rfl: 3   timolol (TIMOPTIC) 0.5 % ophthalmic solution, Place 1 drop into both eyes daily., Disp: , Rfl:    torsemide (DEMADEX) 20 MG tablet, Take 3 tablets (60 mg total) by mouth daily., Disp: 270 tablet, Rfl: 3   Vitamin D3 (VITAMIN D) 25 MCG tablet, Take 1,000 Units by mouth daily., Disp: , Rfl:   Current Facility-Administered Medications:    sodium chloride flush (NS) 0.9 % injection 3 mL, 3 mL, Intravenous, Q12H, Satira Sark, MD     SLEEP ARCHITECTURE The study was initiated at 9:53:25 PM and ended at 4:25:56 AM.  Sleep onset time was 115.7 minutes and the sleep efficiency was 64.0%. The total sleep time was 251.3 minutes.  Stage REM latency was 224.5 minutes.  The patient spent 12.14% of the night in stage N1 sleep, 71.75% in stage N2 sleep, 7.36% in stage N3 and 8.8% in REM.  Alpha intrusion was absent.  Supine sleep was 0.00%.  RESPIRATORY PARAMETERS The overall apnea/hypopnea index (AHI) was 49.7 per hour. There were 39 total apneas, including 39 obstructive, 0 central and 0 mixed apneas. There were 169 hypopneas and 0 RERAs.  The  AHI during Stage REM sleep was 76.4 per hour.  AHI while supine was N/A per hour.  The mean oxygen saturation was 86.08%. The minimum SpO2 during sleep was 77.00%.  moderate snoring was noted during this study.  CARDIAC DATA The 2 lead EKG demonstrated sinus rhythm. The mean heart rate was 59.59 beats per minute. Other EKG findings include: PVCs.  LEG MOVEMENT DATA The total PLMS were 607 with a resulting PLMS index of 144.93. Associated arousal with leg movement index was 21.2.  IMPRESSIONS Severe obstructive sleep  apnea syndrome worse during REM sleep is documented with this study. AutoPAP 8-20 is recommended. Severe periodic limb movements are also noted.  Delano Metz, MD Diplomate, American Board of Sleep Medicine.  ELECTRONICALLY SIGNED ON:  10/27/2020, 3:40 PM [place] PH: [phone]   FX: [phone] [place]

## 2020-11-16 DIAGNOSIS — G40219 Localization-related (focal) (partial) symptomatic epilepsy and epileptic syndromes with complex partial seizures, intractable, without status epilepticus: Secondary | ICD-10-CM | POA: Diagnosis not present

## 2020-11-30 ENCOUNTER — Ambulatory Visit: Payer: Medicare PPO | Admitting: Pulmonary Disease

## 2020-11-30 ENCOUNTER — Other Ambulatory Visit: Payer: Self-pay

## 2020-11-30 ENCOUNTER — Encounter: Payer: Self-pay | Admitting: Pulmonary Disease

## 2020-11-30 VITALS — BP 132/74 | HR 58 | Temp 98.3°F | Ht 65.5 in | Wt 270.0 lb

## 2020-11-30 DIAGNOSIS — E669 Obesity, unspecified: Secondary | ICD-10-CM | POA: Diagnosis not present

## 2020-11-30 DIAGNOSIS — G4733 Obstructive sleep apnea (adult) (pediatric): Secondary | ICD-10-CM

## 2020-11-30 DIAGNOSIS — G4730 Sleep apnea, unspecified: Secondary | ICD-10-CM | POA: Diagnosis not present

## 2020-11-30 NOTE — Patient Instructions (Signed)
Will arrange for auto CPAP set up  Follow up in 4 months 

## 2020-11-30 NOTE — Progress Notes (Signed)
[facility]  Chief Complaint  Patient presents with   Consult    Sleep Consult. Snores and wake up feeling like she needs to catch her breath out of her sleep. Says she never feels well rested after sleeping.     Constitutional:  BP 132/74 (BP Location: Left Arm, Patient Position: Sitting)   Pulse (!) 58   Temp 98.3 F (36.8 C) (Oral)   Ht 5' 5.5" (1.664 m)   Wt 270 lb (122.5 kg)   SpO2 95%   BMI 44.25 kg/m   Past Medical History:  Anxiety, OA, Depression, Diverticulosis, HTN, Glaucoma, Nephrolithiasis, PNA, HLD, PAF, TIA, Varicose veins  Past Surgical History:  She  has a past surgical history that includes Abdominal hysterectomy; Colonoscopy; Colonoscopy (N/A, 06/18/2013); Lithotripsy; Ablation saphenous vein w/ RFA (Bilateral); Eye surgery; Tonsillectomy; Total knee arthroplasty (Left, 08/25/2014); Mass excision (N/A, 12/07/2017); Cystoscopy with retrograde pyelogram, ureteroscopy and stent placement (Left, 10/18/2018); Holmium laser application (Left, [id number]); Parathyroidectomy (Left, 04/22/2019); and LEFT HEART CATH AND CORONARY ANGIOGRAPHY (N/A, 11/28/2019).  Brief Summary:  Kim Dixon is a 80 y.o. female with obstructive sleep apnea.      Subjective:   Her brother visited her from [place].  He has sleep apnea and uses CPAP.  Told her she needs to get checked out because her snoring is terrible and she stops breathing while asleep.  She feels tired all the time, and can fall asleep while watching TV.  She sometimes gets leg cramps.  She will wake up in a dream and be talking in her sleep.    She had sleep study in August that showed severe obstructive sleep apnea.  She goes to sleep at midnight.  She falls asleep quickly.  She wakes up some times to use the bathroom.  She gets out of bed at 9 am.  She feels tired in the morning.  She denies morning headache.  She does not use anything to help her fall sleep.  She drinks coffee in  the morning.  She denies sleep walking, bruxism, or nightmares.  There is no history of restless legs.  She denies sleep hallucinations, sleep paralysis, or cataplexy.  The Epworth score is 7 out of 24.   Physical Exam:   Appearance - well kempt   ENMT - no sinus tenderness, no oral exudate, no LAN, Mallampati 4 airway, no stridor  Respiratory - equal breath sounds bilaterally, no wheezing or rales  CV - s1s2 regular rate and rhythm, no murmurs  Ext - no clubbing, no edema  Skin - no rashes  Psych - normal mood and affect   Sleep Tests:  PSG 10/26/20 >> AHI 49.7, SpO2 low 77%  Cardiac Tests:  Echo 09/30/19 >> EF 60 to 65%, mod LA dilation  Social History:  She  reports that she quit smoking about 27 years ago. Her smoking use included cigarettes. She has a 90.00 pack-year smoking history. She has never used smokeless tobacco. She reports current alcohol use. She reports that she does not use drugs.  Family History:  Her family history includes Coronary artery disease in her mother; Lung cancer in her father.     Assessment/Plan:   Obstructive sleep apnea. - reviewed her sleep study results - discussed how untreated sleep apnea can impact her health - discussed importance of weight loss - treatment options reviewed - will arrange for auto CPAP 5 to 15 cm H2O  Paroxysmal atrial fibrillation, Chronic diastolic CHF. -  followed by Dr. Johnny Bridge with [place]  Time Spent Involved in Patient Care on Day of Examination:  47 minutes  Follow up:   Patient Instructions  Will arrange for auto CPAP set up   Follow up in 4 months  Medication List:   Allergies as of 11/30/2020       Reactions   Naproxen Sodium    Tightness in throat        Medication List        Accurate as of November 30, 2020  3:24 PM. If you have any questions, ask your nurse or doctor.          STOP taking these medications    metoprolol tartrate 25 MG tablet Commonly  known as: LOPRESSOR Stopped by: Chesley Mires, MD   potassium chloride SA 20 MEQ tablet Commonly known as: [place]-Con M20 Stopped by: Chesley Mires, MD       TAKE these medications    apixaban 5 MG Tabs tablet Commonly known as: Eliquis TAKE ONE TABLET ('5MG'$  TOTAL) BY MOUTH TWICE DAILY   atorvastatin 10 MG tablet Commonly known as: LIPITOR Take 10 mg by mouth every Monday, Wednesday, and Friday at 8 PM.   bimatoprost 0.01 % Soln Commonly known as: LUMIGAN Place 1 drop into both eyes at bedtime.   BIOFREEZE ROLL-ON EX Apply 1 application topically 3 (three) times daily as needed (back pain.).   citalopram 20 MG tablet Commonly known as: CELEXA Take 20 mg by mouth daily.   losartan 100 MG tablet Commonly known as: COZAAR Take 100 mg by mouth daily. What changed: Another medication with the same name was removed. Continue taking this medication, and follow the directions you see here. Changed by: Chesley Mires, MD   timolol 0.5 % ophthalmic solution Commonly known as: TIMOPTIC Place 1 drop into both eyes daily.   torsemide 20 MG tablet Commonly known as: DEMADEX Take 3 tablets (60 mg total) by mouth daily.   vitamin C 1000 MG tablet Take 3,000 mg by mouth daily.   Vitamin D3 25 MCG tablet Commonly known as: Vitamin D Take 1,000 Units by mouth daily.        Signature:  Chesley Mires, MD [place] Pager - [phone] 11/30/2020, 3:24 PM

## 2020-12-20 ENCOUNTER — Telehealth: Payer: Self-pay | Admitting: Pulmonary Disease

## 2020-12-20 DIAGNOSIS — G4733 Obstructive sleep apnea (adult) (pediatric): Secondary | ICD-10-CM

## 2020-12-20 DIAGNOSIS — I4891 Unspecified atrial fibrillation: Secondary | ICD-10-CM | POA: Diagnosis not present

## 2020-12-20 DIAGNOSIS — G40209 Localization-related (focal) (partial) symptomatic epilepsy and epileptic syndromes with complex partial seizures, not intractable, without status epilepticus: Secondary | ICD-10-CM | POA: Diagnosis not present

## 2020-12-20 DIAGNOSIS — E669 Obesity, unspecified: Secondary | ICD-10-CM | POA: Diagnosis not present

## 2020-12-20 DIAGNOSIS — G5603 Carpal tunnel syndrome, bilateral upper limbs: Secondary | ICD-10-CM | POA: Diagnosis not present

## 2020-12-20 NOTE — Telephone Encounter (Signed)
Please send order to [facility] for auto CPAP 5 to 15 cm H2O with heated humidity and mask of choice.

## 2020-12-20 NOTE — Telephone Encounter (Signed)
I have called and LM on VM for the pt to make her aware that order will be sent to [facility] for her cpap instead of [facility].  Nothing further is needed.

## 2020-12-20 NOTE — Telephone Encounter (Signed)
VS please advise if you are ok with sending the order for the cpap to [facility] instead of [facility].  Pt stated that this is closer for her.   Thanks

## 2020-12-23 ENCOUNTER — Institutional Professional Consult (permissible substitution): Payer: Medicare PPO | Admitting: Pulmonary Disease

## 2020-12-24 ENCOUNTER — Other Ambulatory Visit: Payer: Self-pay | Admitting: Cardiology

## 2020-12-29 DIAGNOSIS — H25813 Combined forms of age-related cataract, bilateral: Secondary | ICD-10-CM | POA: Diagnosis not present

## 2020-12-29 DIAGNOSIS — H401133 Primary open-angle glaucoma, bilateral, severe stage: Secondary | ICD-10-CM | POA: Diagnosis not present

## 2020-12-30 ENCOUNTER — Other Ambulatory Visit: Payer: Self-pay | Admitting: Cardiology

## 2021-01-18 DIAGNOSIS — E785 Hyperlipidemia, unspecified: Secondary | ICD-10-CM | POA: Diagnosis not present

## 2021-01-18 DIAGNOSIS — G4089 Other seizures: Secondary | ICD-10-CM | POA: Diagnosis not present

## 2021-01-18 DIAGNOSIS — Z79899 Other long term (current) drug therapy: Secondary | ICD-10-CM | POA: Diagnosis not present

## 2021-01-18 DIAGNOSIS — I10 Essential (primary) hypertension: Secondary | ICD-10-CM | POA: Diagnosis not present

## 2021-01-18 DIAGNOSIS — I5032 Chronic diastolic (congestive) heart failure: Secondary | ICD-10-CM | POA: Diagnosis not present

## 2021-01-18 DIAGNOSIS — E1129 Type 2 diabetes mellitus with other diabetic kidney complication: Secondary | ICD-10-CM | POA: Diagnosis not present

## 2021-01-18 DIAGNOSIS — I480 Paroxysmal atrial fibrillation: Secondary | ICD-10-CM | POA: Diagnosis not present

## 2021-01-24 DIAGNOSIS — I5032 Chronic diastolic (congestive) heart failure: Secondary | ICD-10-CM | POA: Diagnosis not present

## 2021-01-24 DIAGNOSIS — E1122 Type 2 diabetes mellitus with diabetic chronic kidney disease: Secondary | ICD-10-CM | POA: Diagnosis not present

## 2021-01-24 DIAGNOSIS — Z23 Encounter for immunization: Secondary | ICD-10-CM | POA: Diagnosis not present

## 2021-01-24 DIAGNOSIS — R7309 Other abnormal glucose: Secondary | ICD-10-CM | POA: Diagnosis not present

## 2021-01-24 DIAGNOSIS — G4733 Obstructive sleep apnea (adult) (pediatric): Secondary | ICD-10-CM | POA: Diagnosis not present

## 2021-01-31 ENCOUNTER — Other Ambulatory Visit: Payer: Self-pay | Admitting: Cardiology

## 2021-02-07 ENCOUNTER — Other Ambulatory Visit: Payer: Self-pay | Admitting: Cardiology

## 2021-02-07 ENCOUNTER — Telehealth: Payer: Self-pay | Admitting: Cardiology

## 2021-02-07 MED ORDER — TORSEMIDE 20 MG PO TABS: ORAL_TABLET | ORAL | 0 refills | Status: DC

## 2021-02-07 NOTE — Telephone Encounter (Signed)
Pt to see Bernerd Pho, PA-C in the [place] office on 03/25/20 at 1 pm.  Will send refill in for pt until appt. Pt appreciative of call back.

## 2021-02-07 NOTE — Telephone Encounter (Signed)
*  STAT* If patient is at the pharmacy, call can be transferred to refill team.   1. Which medications need to be refilled? (please list name of each medication and dose if known) torsemide (DEMADEX) 20 MG tablet  2. Which pharmacy/location (including street and city if local pharmacy) is medication to be sent to? [place]  3. Do they need a 30 day or 90 day supply? [facility] LAST REFILL ON 12/30/20 FOR 1 MONTH SUPPLY. I ADVISED PT THAT SHE NEEDED TO MAKE A 6 MONTH APPT BUT SHE REFUSED.

## 2021-02-14 DIAGNOSIS — G5603 Carpal tunnel syndrome, bilateral upper limbs: Secondary | ICD-10-CM | POA: Diagnosis not present

## 2021-02-14 DIAGNOSIS — I4891 Unspecified atrial fibrillation: Secondary | ICD-10-CM | POA: Diagnosis not present

## 2021-02-14 DIAGNOSIS — Z79899 Other long term (current) drug therapy: Secondary | ICD-10-CM | POA: Diagnosis not present

## 2021-02-14 DIAGNOSIS — E669 Obesity, unspecified: Secondary | ICD-10-CM | POA: Diagnosis not present

## 2021-02-14 DIAGNOSIS — G40209 Localization-related (focal) (partial) symptomatic epilepsy and epileptic syndromes with complex partial seizures, not intractable, without status epilepticus: Secondary | ICD-10-CM | POA: Diagnosis not present

## 2021-03-20 DIAGNOSIS — C4492 Squamous cell carcinoma of skin, unspecified: Secondary | ICD-10-CM

## 2021-03-20 HISTORY — DX: Squamous cell carcinoma of skin, unspecified: C44.92

## 2021-03-22 ENCOUNTER — Other Ambulatory Visit: Payer: Self-pay | Admitting: Cardiology

## 2021-03-24 ENCOUNTER — Encounter: Payer: Self-pay | Admitting: Cardiology

## 2021-03-24 ENCOUNTER — Ambulatory Visit: Payer: Medicare PPO | Admitting: Cardiology

## 2021-03-24 VITALS — BP 114/68 | HR 55 | Ht 63.5 in | Wt 271.0 lb

## 2021-03-24 DIAGNOSIS — I480 Paroxysmal atrial fibrillation: Secondary | ICD-10-CM | POA: Diagnosis not present

## 2021-03-24 DIAGNOSIS — I5032 Chronic diastolic (congestive) heart failure: Secondary | ICD-10-CM

## 2021-03-24 NOTE — Progress Notes (Signed)
Cardiology Office Note  Date: 03/24/2021   ID: Kim Dixon, DOB [birth year], MRN [id number]  PCP:  Asencion Noble, MD  Cardiologist:  Rozann Lesches, MD Electrophysiologist:  None   Chief Complaint  Patient presents with   Cardiac follow-up    History of Present Illness: Kim Dixon is an 81 y.o. female last seen in January 2022.  She is here for a follow-up visit.  She does not report any definite sense of palpitations.  Does describe intermittent spells of syncope/seizure-like activity, has apparently been evaluated by Dr. Merlene Laughter and is currently on Keppra.  Her last event was in September.  I reviewed her current medications.  She does not report any bleeding problems on Eliquis for stroke prophylaxis.  Weight has been stable, she does not describe any worsening leg swelling on current diuretic regimen.  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  Past Medical History:  Diagnosis Date   Anxiety    Arthritis    Basal cell carcinoma    RIGHT THIGH BCC CX3 5FU   Basal cell carcinoma 12/16/2019   right anterior neck (CX35FU)   Depression    Diverticulosis    Essential hypertension    Glaucoma    Hematuria    History of kidney stones    History of pneumonia    Hyperlipidemia    PAF (paroxysmal atrial fibrillation) (HCC)    Pneumonia    Stress incontinence    TIA (transient ischemic attack)    January 2020   Urinary frequency    Varicose veins     Past Surgical History:  Procedure Laterality Date   ABDOMINAL HYSTERECTOMY     ABLATION SAPHENOUS VEIN W/ RFA Bilateral    COLONOSCOPY     COLONOSCOPY N/A 06/18/2013   Procedure: COLONOSCOPY;  Surgeon: Rogene Houston, MD;  Location: [facility];  Service: Endoscopy;  Laterality: N/A;  [place], URETEROSCOPY AND STENT PLACEMENT Left 10/18/2018   Procedure: CYSTOSCOPY WITH RETROGRADE PYELOGRAM, URETEROSCOPY AND STENT PLACEMENT;   Surgeon: Alexis Frock, MD;  Location: [facility];  Service: Urology;  Laterality: Left;  1 HR   EYE SURGERY     laser surgery bilat    HOLMIUM LASER APPLICATION Left 9/38/1829   Procedure: HOLMIUM LASER APPLICATION;  Surgeon: Alexis Frock, MD;  Location: [facility];  Service: Urology;  Laterality: Left;   LEFT HEART CATH AND CORONARY ANGIOGRAPHY N/A 11/28/2019   Procedure: LEFT HEART CATH AND CORONARY ANGIOGRAPHY;  Surgeon: Wellington Hampshire, MD;  Location: [facility];  Service: Cardiovascular;  Laterality: N/A;   LITHOTRIPSY     MASS EXCISION N/A 12/07/2017   Procedure: EXCISION 3CM CYST ON BACK;  Surgeon: Aviva Signs, MD;  Location: [facility];  Service: General;  Laterality: N/A;   PARATHYROIDECTOMY Left 04/22/2019   Procedure: LEFT INFERIOR PARATHYROIDECTOMY;  Surgeon: Armandina Gemma, MD;  Location: [facility];  Service: General;  Laterality: Left;   TONSILLECTOMY     TOTAL KNEE ARTHROPLASTY Left 08/25/2014   Procedure: LEFT TOTAL KNEE ARTHROPLASTY;  Surgeon: Paralee Cancel, MD;  Location: [facility];  Service: Orthopedics;  Laterality: Left;    Current Outpatient Medications  Medication Sig Dispense Refill   apixaban (ELIQUIS) 5 MG TABS tablet TAKE ONE TABLET (5MG  TOTAL) BY MOUTH TWICE DAILY 180 tablet 3   Ascorbic Acid (VITAMIN C) 1000 MG tablet Take 3,000 mg by mouth daily.  atorvastatin (LIPITOR) 10 MG tablet Take 10 mg by mouth every Monday, Wednesday, and Friday at 8 PM.      bimatoprost (LUMIGAN) 0.01 % SOLN Place 1 drop into both eyes at bedtime.     citalopram (CELEXA) 20 MG tablet Take 20 mg by mouth daily.      Cyanocobalamin (VITAMIN B-12 SL) Place 1 tablet under the tongue daily.     levETIRAcetam (KEPPRA) 500 MG tablet Take 250 mg by mouth 2 (two) times daily.     losartan (COZAAR) 100 MG tablet Take 100 mg by mouth daily.     Menthol, Topical Analgesic, (BIOFREEZE ROLL-ON EX) Apply 1 application topically 3 (three) times daily as needed (back pain.).     potassium chloride SA  (KLOR-CON M) 20 MEQ tablet TAKE ONE TABLET (20MEQ TOTAL) BY MOUTH DAILY 90 tablet 0   timolol (TIMOPTIC) 0.5 % ophthalmic solution Place 1 drop into both eyes daily.     torsemide (DEMADEX) 20 MG tablet TAKE THREE TABLETS BY MOUTH ONCE A DAY. 270 tablet 0   Vitamin D3 (VITAMIN D) 25 MCG tablet Take 1,000 Units by mouth daily.     Current Facility-Administered Medications  Medication Dose Route Frequency Provider Last Rate Last Admin   sodium chloride flush (NS) 0.9 % injection 3 mL  3 mL Intravenous Q12H Satira Sark, MD       Allergies:  Naproxen sodium   ROS: No orthopnea or PND.  Physical Exam: VS:  BP 114/68    Pulse (!) 55    Ht 5' 3.5" (1.613 m)    Wt 271 lb (122.9 kg)    SpO2 93%    BMI 47.25 kg/m , BMI Body mass index is 47.25 kg/m.  Wt Readings from Last 3 Encounters:  03/24/21 271 lb (122.9 kg)  11/30/20 270 lb (122.5 kg)  04/01/20 271 lb (122.9 kg)    General: Patient appears comfortable at rest. HEENT: Conjunctiva and lids normal, wearing a mask. Neck: Supple, no elevated JVP or carotid bruits, no thyromegaly. Lungs: Clear to auscultation, nonlabored breathing at rest. Cardiac: Regular rate and rhythm, no S3 or significant systolic murmur, no pericardial rub. Extremities: Mild ankle edema.  ECG:  An ECG dated 06/08/2020 was personally reviewed today and demonstrated:  Atrial fibrillation with RVR, low voltage, nonspecific ST-T changes.  Recent Labwork: 06/08/2020: ALT 15; AST 17; BUN 18; Creatinine, Ser 0.93; Hemoglobin 14.4; Platelets 185; Potassium 3.5; Sodium 138  July 2022: BUN 20, creatinine 1.08, potassium 4.9, AST 10, ALT 12, hemoglobin 13.2, platelets 164, cholesterol 162, triglycerides 105, HDL 68, LDL 75, hemoglobin A1c 7.1%  Other Studies Reviewed Today:  Cardiac catheterization 11/28/2019: The left ventricular systolic function is normal. LV end diastolic pressure is moderately elevated. The left ventricular ejection fraction is 55-65% by visual  estimate.   1.  Normal coronary arteries. 2.  Normal LV systolic function.  Mitral annular calcifications noted. 3.  Moderately elevated left ventricular end-diastolic pressure at 24 mmHg.   Echocardiogram 09/30/2019:  1. Left ventricular ejection fraction, by estimation, is 60 to 65%. The  left ventricle has normal function. The left ventricle has no regional  wall motion abnormalities. Left ventricular diastolic parameters were  normal.   2. Right ventricular systolic function is normal. The right ventricular  size is normal.   3. Left atrial size was moderately dilated.   4. The mitral valve is normal in structure. Trivial mitral valve  regurgitation. No evidence of mitral stenosis.   5. The  aortic valve is tricuspid. Aortic valve regurgitation is not  visualized. No aortic stenosis is present.   6. The inferior vena cava is dilated in size with >50% respiratory  variability, suggesting right atrial pressure of 8 mmHg.   Assessment and Plan:  1.  Paroxysmal atrial fibrillation with CHA2DS2-VASc score of 6.  She remains on Eliquis for stroke prophylaxis, no significant bleeding problems.  She does not report any regular sense of palpitations.  I do note that she was in atrial fibrillation with RVR by ECG back in March 2022.  Currently not on any AV nodal blockers with resting heart rate in the 50s.  Recommended continued observation for now.  I do question whether any of her suspected neurological events could be arrhythmogenic in etiology (such as post-termination pauses).  I have asked her to let me know if these increase in frequency, would repeat cardiac monitoring at that time.  2.  HFpEF with chronic diastolic heart failure.  Weight is stable and she reports reasonable control of edema on current diuretic regimen with potassium supplement.  Medication Adjustments/Labs and Tests Ordered: Current medicines are reviewed at length with the patient today.  Concerns regarding medicines  are outlined above.   Tests Ordered: No orders of the defined types were placed in this encounter.   Medication Changes: No orders of the defined types were placed in this encounter.   Disposition:  Follow up  6 months.  Signed, Satira Sark, MD, [facility] 03/24/2021 2:52 PM    [facility] [place], [place], [place] [address] Phone: [phone]; Fax: [phone]

## 2021-03-24 NOTE — Patient Instructions (Addendum)

## 2021-03-25 ENCOUNTER — Ambulatory Visit: Payer: Medicare PPO | Admitting: Student

## 2021-04-21 DIAGNOSIS — Z79899 Other long term (current) drug therapy: Secondary | ICD-10-CM | POA: Diagnosis not present

## 2021-04-21 DIAGNOSIS — E1129 Type 2 diabetes mellitus with other diabetic kidney complication: Secondary | ICD-10-CM | POA: Diagnosis not present

## 2021-04-21 DIAGNOSIS — G4733 Obstructive sleep apnea (adult) (pediatric): Secondary | ICD-10-CM | POA: Diagnosis not present

## 2021-04-21 DIAGNOSIS — I5032 Chronic diastolic (congestive) heart failure: Secondary | ICD-10-CM | POA: Diagnosis not present

## 2021-04-21 DIAGNOSIS — G4089 Other seizures: Secondary | ICD-10-CM | POA: Diagnosis not present

## 2021-04-21 DIAGNOSIS — I480 Paroxysmal atrial fibrillation: Secondary | ICD-10-CM | POA: Diagnosis not present

## 2021-04-28 DIAGNOSIS — E1122 Type 2 diabetes mellitus with diabetic chronic kidney disease: Secondary | ICD-10-CM | POA: Diagnosis not present

## 2021-04-28 DIAGNOSIS — N1831 Chronic kidney disease, stage 3a: Secondary | ICD-10-CM | POA: Diagnosis not present

## 2021-04-28 DIAGNOSIS — I5020 Unspecified systolic (congestive) heart failure: Secondary | ICD-10-CM | POA: Diagnosis not present

## 2021-04-28 DIAGNOSIS — R7309 Other abnormal glucose: Secondary | ICD-10-CM | POA: Diagnosis not present

## 2021-05-03 ENCOUNTER — Other Ambulatory Visit: Payer: Self-pay

## 2021-05-03 ENCOUNTER — Ambulatory Visit: Payer: Medicare PPO | Admitting: Dermatology

## 2021-05-03 DIAGNOSIS — L720 Epidermal cyst: Secondary | ICD-10-CM | POA: Diagnosis not present

## 2021-05-03 DIAGNOSIS — L570 Actinic keratosis: Secondary | ICD-10-CM

## 2021-05-03 DIAGNOSIS — Z1283 Encounter for screening for malignant neoplasm of skin: Secondary | ICD-10-CM

## 2021-05-03 DIAGNOSIS — D0462 Carcinoma in situ of skin of left upper limb, including shoulder: Secondary | ICD-10-CM | POA: Diagnosis not present

## 2021-05-03 DIAGNOSIS — D485 Neoplasm of uncertain behavior of skin: Secondary | ICD-10-CM

## 2021-05-03 DIAGNOSIS — L821 Other seborrheic keratosis: Secondary | ICD-10-CM | POA: Diagnosis not present

## 2021-05-03 DIAGNOSIS — Z85828 Personal history of other malignant neoplasm of skin: Secondary | ICD-10-CM

## 2021-05-05 ENCOUNTER — Encounter: Payer: Self-pay | Admitting: *Deleted

## 2021-05-05 ENCOUNTER — Other Ambulatory Visit: Payer: Self-pay | Admitting: Cardiology

## 2021-05-05 NOTE — Telephone Encounter (Signed)
Prescription refill request for Eliquis received. Indication: PAF Last office visit: 03/24/21  Myles Gip MD Scr: 1.14 on 01/18/21 Age:  81 Weight: 122.9kg  Based on above findings Eliquis 5mg  twice daily is the appropriate dose.  Refill approved.

## 2021-05-09 ENCOUNTER — Telehealth: Payer: Self-pay | Admitting: *Deleted

## 2021-05-09 ENCOUNTER — Telehealth: Payer: Self-pay

## 2021-05-09 DIAGNOSIS — H02043 Spastic entropion of right eye, unspecified eyelid: Secondary | ICD-10-CM | POA: Diagnosis not present

## 2021-05-09 DIAGNOSIS — H401133 Primary open-angle glaucoma, bilateral, severe stage: Secondary | ICD-10-CM | POA: Diagnosis not present

## 2021-05-09 DIAGNOSIS — H25812 Combined forms of age-related cataract, left eye: Secondary | ICD-10-CM | POA: Diagnosis not present

## 2021-05-09 NOTE — Telephone Encounter (Signed)
Left message for patient to call office surgery needed

## 2021-05-09 NOTE — Telephone Encounter (Signed)
-----   Message from [name], MD sent at 05/05/2021  8:55 PM EST ----- Schedule surgery with Dr. Darene Lamer

## 2021-05-09 NOTE — Telephone Encounter (Signed)
Path to patient. Surgery made with Dr.Tafeen.

## 2021-05-16 ENCOUNTER — Telehealth: Payer: Self-pay

## 2021-05-16 NOTE — Telephone Encounter (Signed)
Called to ask patient if she could bring SD card to appt tomorrow, she agreed. Nothing further needed

## 2021-05-17 ENCOUNTER — Ambulatory Visit: Payer: Medicare PPO | Admitting: Pulmonary Disease

## 2021-05-17 ENCOUNTER — Encounter: Payer: Self-pay | Admitting: Pulmonary Disease

## 2021-05-17 ENCOUNTER — Other Ambulatory Visit: Payer: Self-pay

## 2021-05-17 ENCOUNTER — Ambulatory Visit (HOSPITAL_COMMUNITY)
Admission: RE | Admit: 2021-05-17 | Discharge: 2021-05-17 | Disposition: A | Discharge status: Home / Self Care | Payer: Medicare PPO | Source: Ambulatory Visit | Attending: Pulmonary Disease | Admitting: Pulmonary Disease

## 2021-05-17 VITALS — BP 140/88 | HR 68 | Temp 98.4°F | Ht 63.5 in | Wt 268.8 lb

## 2021-05-17 DIAGNOSIS — R0609 Other forms of dyspnea: Secondary | ICD-10-CM

## 2021-05-17 DIAGNOSIS — G4733 Obstructive sleep apnea (adult) (pediatric): Secondary | ICD-10-CM

## 2021-05-17 DIAGNOSIS — R0602 Shortness of breath: Secondary | ICD-10-CM | POA: Diagnosis not present

## 2021-05-17 MED ORDER — ALBUTEROL SULFATE HFA 108 (90 BASE) MCG/ACT IN AERS: 2.0000 | INHALATION_SPRAY | Freq: Four times a day (QID) | RESPIRATORY_TRACT | 2 refills | Status: AC | PRN

## 2021-05-17 NOTE — Progress Notes (Signed)
[facility]  Chief Complaint  Patient presents with   Follow-up    Patient is unsure about how well CPAP is working     Constitutional:  BP 140/88 (BP Location: Left Arm, Patient Position: Sitting)    Pulse 68    Temp 98.4 F (36.9 C) (Temporal)    Ht 5' 3.5" (1.613 m)    Wt 268 lb 12.8 oz (121.9 kg)    SpO2 96% Comment: ra   BMI 46.87 kg/m   Past Medical History:  Anxiety, OA, Depression, Diverticulosis, HTN, Glaucoma, Nephrolithiasis, PNA, HLD, PAF, TIA, Varicose veins  Past Surgical History:  She  has a past surgical history that includes Abdominal hysterectomy; Colonoscopy; Colonoscopy (N/A, 06/18/2013); Lithotripsy; Ablation saphenous vein w/ RFA (Bilateral); Eye surgery; Tonsillectomy; Total knee arthroplasty (Left, 08/25/2014); Mass excision (N/A, 12/07/2017); Cystoscopy with retrograde pyelogram, ureteroscopy and stent placement (Left, 10/18/2018); Holmium laser application (Left, 09/14/3149); Parathyroidectomy (Left, 04/22/2019); and LEFT HEART CATH AND CORONARY ANGIOGRAPHY (N/A, 11/28/2019).  Brief Summary:  Kim Dixon is a 81 y.o. female with obstructive sleep apnea.      Subjective:   She has been using CPAP.  She isn't sure if it is helping.  She does feel like she is resting better and not needing to nap during the day anymore.  She gets water build up in the her mask sometimes, and sometimes gets dry mouth.    She has noticed feeling short of breath more.  She feels her chest gets tight and she can't take a deep breath.  She isn't having cough, wheeze, or sputum.  She gets seasonal allergies.  Used an inhaler years ago and this helped.    She is followed by Dr. Domenic Polite for PAF and chronic diastolic CHF, and last seen in January 2023.   Physical Exam:   Appearance - well kempt   ENMT - no sinus tenderness, no oral exudate, no LAN, Mallampati 3 airway, no stridor  Respiratory - equal breath sounds bilaterally, no  wheezing or rales  CV - s1s2 regular rate and rhythm, no murmurs  Ext - no clubbing, no edema  Skin - no rashes  Psych - normal mood and affect    Pulmonary Tests:    Sleep Tests:  PSG 10/26/20 >> AHI 49.7, SpO2 low 77% Auto CPAP 02/17/21 to 05/17/21 >> used on 63 of 90 nights with average 4 hours.  Average AHI 4.1 with mean CPAP 9 and 95 th percentile CPAP 12 cm H2O.  Cardiac Tests:  Echo 09/30/19 >> EF 60 to 65%, mod LA dilation  Social History:  She  reports that she quit smoking about 28 years ago. Her smoking use included cigarettes. She has a 90.00 pack-year smoking history. She has never used smokeless tobacco. She reports current alcohol use. She reports that she does not use drugs.  Family History:  Her family history includes Coronary artery disease in her mother; Lung cancer in her father.     Assessment/Plan:   Dyspnea on exertion. - might related to asthma with history of seasonal allergies - will arrange for chest xray - will arrange for pulmonary function test in [place] office - prn albuterol - likely has a component of deconditioning; advised her to increase her exercise routine to several times per wek  Obstructive sleep apnea. - she is compliant with CPAP and reports benefit from therapy - she uses [employer] for her DME - discussed how to fix rain out - discussed  techniques to improve mask fit and limit air leak - will change auto CPAP to 5 -10 cm H2O  Paroxysmal atrial fibrillation, Chronic diastolic CHF. - followed by Dr. Johnny Bridge with [facility]  Time Spent Involved in Patient Care on Day of Examination:  48 minutes  Follow up:   Patient Instructions  Your CPAP report shows good control of sleep apnea with current settings;  We can narrow the pressure range to see if this helps with mask leak and mouth dryness.  Chest xray today.  Albuterol two puffs every 6 hours as needed for cough, wheeze, chest congestion, or shortness of  breath  Will schedule pulmonary function test in [place] office and follow up appointment right after this in 6 to 8 weeks.  Medication List:   Allergies as of 05/17/2021       Reactions   Naproxen Sodium    Tightness in throat        Medication List        Accurate as of May 17, 2021  3:17 PM. If you have any questions, ask your nurse or doctor.          albuterol 108 (90 Base) MCG/ACT inhaler Commonly known as: VENTOLIN HFA Inhale 2 puffs into the lungs every 6 (six) hours as needed for wheezing or shortness of breath. Started by: Chesley Mires, MD   atorvastatin 10 MG tablet Commonly known as: LIPITOR Take 10 mg by mouth every Monday, Wednesday, and Friday at 8 PM.   bimatoprost 0.01 % Soln Commonly known as: LUMIGAN Place 1 drop into both eyes at bedtime.   BIOFREEZE ROLL-ON EX Apply 1 application topically 3 (three) times daily as needed (back pain.).   citalopram 20 MG tablet Commonly known as: CELEXA Take 20 mg by mouth daily.   Eliquis 5 MG Tabs tablet Generic drug: apixaban TAKE ONE TABLET (5MG  TOTAL) BY MOUTH TWICE DAILY   levETIRAcetam 500 MG tablet Commonly known as: KEPPRA Take 250 mg by mouth 2 (two) times daily.   losartan 100 MG tablet Commonly known as: COZAAR Take 100 mg by mouth daily.   potassium chloride SA 20 MEQ tablet Commonly known as: KLOR-CON M TAKE ONE TABLET (20MEQ TOTAL) BY MOUTH DAILY   timolol 0.5 % ophthalmic solution Commonly known as: TIMOPTIC Place 1 drop into both eyes daily.   torsemide 20 MG tablet Commonly known as: DEMADEX TAKE THREE TABLETS BY MOUTH ONCE A DAY.   VITAMIN B-12 SL Place 1 tablet under the tongue daily.   vitamin C 1000 MG tablet Take 3,000 mg by mouth daily.   Vitamin D3 25 MCG tablet Commonly known as: Vitamin D Take 1,000 Units by mouth daily.        Signature:  Chesley Mires, MD [facility] Pager - [phone] 05/17/2021, 3:17 PM

## 2021-05-17 NOTE — Patient Instructions (Signed)
Your CPAP report shows good control of sleep apnea with current settings;  We can narrow the pressure range to see if this helps with mask leak and mouth dryness.  Chest xray today.  Albuterol two puffs every 6 hours as needed for cough, wheeze, chest congestion, or shortness of breath  Will schedule pulmonary function test in [place] office and follow up appointment right after this in 6 to 8 weeks.

## 2021-05-20 ENCOUNTER — Encounter: Payer: Self-pay | Admitting: Dermatology

## 2021-05-20 NOTE — Progress Notes (Signed)
° °  Follow-Up Visit   Subjective  Kim Dixon is a 81 y.o. female who presents for the following: Annual Exam (Lesion on left arm x year. Lesion on back of both legs x months.  Personal history of bcc).  Annual skin check,, spots on arms and legs Location:  Duration:  Quality:  Associated Signs/Symptoms: Modifying Factors:  Severity:  Timing: Context:   Objective  Well appearing patient in no apparent distress; mood and affect are within normal limits. Scalp General skin examination, no atypical pigmented lesions.  2 possible nonmelanoma skin cancers arm will be biopsied  Left Postauricular Area 4 mm tan textured papule  Left Nasal Sidewall, Right Medial Canthus, Right Parietal Scalp Hard white upper dermal half millimeter papules  Left Forearm Inferior Waxy pink 1 cm crust       Left Forearm Superior Waxy pink 9 mm crust       Left Lower Leg - Anterior, Right Lower Leg - Anterior Gritty 6 mm pink crusts    A full examination was performed including scalp, head, eyes, ears, nose, lips, neck, chest, axillae, abdomen, back, buttocks, bilateral upper extremities, bilateral lower extremities, hands, feet, fingers, toes, fingernails, and toenails. All findings within normal limits unless otherwise noted below.  Areas beneath undergarments not fully examined.   Assessment & Plan    Encounter for screening for malignant neoplasm of skin Scalp  Annual skin examination  Seborrheic keratosis Left Postauricular Area  No intervention necessary if clinically stable  [place] (3) Right Medial Canthus; Right Parietal Scalp; Left Nasal Sidewall  No intervention necessary  Neoplasm of uncertain behavior of skin (2) Left Forearm Inferior  Skin / nail biopsy Type of biopsy: tangential   Informed consent: discussed and consent obtained   Timeout: patient name, date of birth, surgical site, and procedure verified   Anesthesia: the lesion was anesthetized in a  standard fashion   Anesthetic:  1% lidocaine w/ epinephrine 1-100,000 local infiltration Instrument used: flexible razor blade   Hemostasis achieved with: aluminum chloride and electrodesiccation   Outcome: patient tolerated procedure well   Post-procedure details: wound care instructions given    Specimen 1 - Surgical pathology Differential Diagnosis: bcc vs scc  Check Margins: No  Left Forearm Superior  Skin / nail biopsy Type of biopsy: tangential   Informed consent: discussed and consent obtained   Timeout: patient name, date of birth, surgical site, and procedure verified   Anesthesia: the lesion was anesthetized in a standard fashion   Anesthetic:  1% lidocaine w/ epinephrine 1-100,000 local infiltration Instrument used: flexible razor blade   Hemostasis achieved with: aluminum chloride and electrodesiccation   Outcome: patient tolerated procedure well   Post-procedure details: wound care instructions given    Specimen 2 - Surgical pathology Differential Diagnosis: bcc vs scc  Check Margins: No  AK (actinic keratosis) (2) Left Lower Leg - Anterior; Right Lower Leg - Anterior  May freeze in future.      I, [name], MD, have reviewed all documentation for this visit.  The documentation on 05/20/21 for the exam, diagnosis, procedures, and orders are all accurate and complete.

## 2021-06-22 ENCOUNTER — Other Ambulatory Visit: Payer: Self-pay | Admitting: Cardiology

## 2021-06-23 ENCOUNTER — Encounter: Payer: Medicare PPO | Admitting: Dermatology

## 2021-06-23 DIAGNOSIS — Z0001 Encounter for general adult medical examination with abnormal findings: Secondary | ICD-10-CM | POA: Diagnosis not present

## 2021-06-23 DIAGNOSIS — M1990 Unspecified osteoarthritis, unspecified site: Secondary | ICD-10-CM | POA: Diagnosis not present

## 2021-06-30 DIAGNOSIS — H2512 Age-related nuclear cataract, left eye: Secondary | ICD-10-CM | POA: Diagnosis not present

## 2021-06-30 DIAGNOSIS — H401123 Primary open-angle glaucoma, left eye, severe stage: Secondary | ICD-10-CM | POA: Diagnosis not present

## 2021-07-06 ENCOUNTER — Encounter: Payer: Self-pay | Admitting: Pulmonary Disease

## 2021-07-06 ENCOUNTER — Ambulatory Visit: Payer: Medicare PPO | Admitting: Pulmonary Disease

## 2021-07-06 VITALS — BP 140/78 | HR 67 | Temp 97.5°F | Ht 63.5 in | Wt 272.0 lb

## 2021-07-06 DIAGNOSIS — E669 Obesity, unspecified: Secondary | ICD-10-CM

## 2021-07-06 DIAGNOSIS — G4730 Sleep apnea, unspecified: Secondary | ICD-10-CM

## 2021-07-06 DIAGNOSIS — G4733 Obstructive sleep apnea (adult) (pediatric): Secondary | ICD-10-CM | POA: Diagnosis not present

## 2021-07-06 DIAGNOSIS — R0609 Other forms of dyspnea: Secondary | ICD-10-CM

## 2021-07-06 LAB — PULMONARY FUNCTION TEST
DL/VA % pred: 139 %
DL/VA: 5.71 ml/min/mmHg/L
DLCO cor % pred: 105 %
DLCO cor: 19.43 ml/min/mmHg
DLCO unc % pred: 105 %
DLCO unc: 19.43 ml/min/mmHg
FEF 25-75 Post: 1.83 L/sec
FEF 25-75 Pre: 0.91 L/sec
FEF2575-%Change-Post: 101 %
FEF2575-%Pred-Post: 132 %
FEF2575-%Pred-Pre: 65 %
FEV1-%Change-Post: 16 %
FEV1-%Pred-Post: 64 %
FEV1-%Pred-Pre: 55 %
FEV1-Post: 1.23 L
FEV1-Pre: 1.06 L
FEV1FVC-%Change-Post: 2 %
FEV1FVC-%Pred-Pre: 109 %
FEV6-%Change-Post: 13 %
FEV6-%Pred-Post: 61 %
FEV6-%Pred-Pre: 54 %
FEV6-Post: 1.49 L
FEV6-Pre: 1.31 L
FEV6FVC-%Pred-Post: 105 %
FEV6FVC-%Pred-Pre: 105 %
FVC-%Change-Post: 13 %
FVC-%Pred-Post: 58 %
FVC-%Pred-Pre: 51 %
FVC-Post: 1.49 L
FVC-Pre: 1.31 L
Post FEV1/FVC ratio: 83 %
Post FEV6/FVC ratio: 100 %
Pre FEV1/FVC ratio: 81 %
Pre FEV6/FVC Ratio: 100 %
RV % pred: 107 %
RV: 2.55 L
TLC % pred: 84 %
TLC: 4.19 L

## 2021-07-06 MED ORDER — ARNUITY ELLIPTA 100 MCG/ACT IN AEPB: 1.0000 | INHALATION_SPRAY | Freq: Every day | RESPIRATORY_TRACT | 5 refills | Status: DC

## 2021-07-06 NOTE — Progress Notes (Signed)
? ?[facility] ? ?Chief Complaint  ?Patient presents with  ? Follow-up  ?  DOE   ? ? ?Constitutional:  ?BP 140/78 (BP Location: Left Arm, Patient Position: Sitting, Cuff Size: Normal)   Pulse 67   Temp (!) 97.5 ?F (36.4 ?C) (Oral)   Ht 5' 3.5" (1.613 m)   Wt 272 lb (123.4 kg)   SpO2 93%   BMI 47.43 kg/m?  ? ?Past Medical History:  ?Anxiety, OA, Depression, Diverticulosis, HTN, Glaucoma, Nephrolithiasis, PNA, HLD, PAF, TIA, Varicose veins ? ?Past Surgical History:  ?She  has a past surgical history that includes Abdominal hysterectomy; Colonoscopy; Colonoscopy (N/A, 06/18/2013); Lithotripsy; Ablation saphenous vein w/ RFA (Bilateral); Eye surgery; Tonsillectomy; Total knee arthroplasty (Left, 08/25/2014); Mass excision (N/A, 12/07/2017); Cystoscopy with retrograde pyelogram, ureteroscopy and stent placement (Left, 10/18/2018); Holmium laser application (Left, 2/77/8242); Parathyroidectomy (Left, 04/22/2019); and LEFT HEART CATH AND CORONARY ANGIOGRAPHY (N/A, 11/28/2019). ? ?Brief Summary:  ?Kim Dixon is a 81 y.o. female with obstructive sleep apnea and asthma. ?  ? ? ? ?Subjective:  ? ?She had PFT earlier this morning.  This showed obstruction in small airways based on appearance of flow volume loop and positive bronchodilator response.  She reports symptomatic improvement after using albuterol during PFT.  She has been using albuterol at least once per day at home and this helps.  She feels her symptoms have been worse with increase in pollen. ? ?Physical Exam:  ? ?Appearance - well kempt  ? ?ENMT - no sinus tenderness, no oral exudate, no LAN, Mallampati 3 airway, no stridor ? ?Respiratory - equal breath sounds bilaterally, no wheezing or rales ? ?CV - s1s2 regular rate and rhythm, no murmurs ? ?Ext - no clubbing, no edema ? ?Skin - no rashes ? ?Psych - normal mood and affect ? ? ?  ?Pulmonary Tests:  ?PFT 07/06/21 >> FEV1 1.23 (64%), FEV1% 83, TLC 4.19 (84%), DLCO  105%, +BD ? ?Sleep Tests:  ?PSG 10/26/20 >> AHI 49.7, SpO2 low 77% ?Auto CPAP 02/17/21 to 05/17/21 >> used on 63 of 90 nights with average 4 hours.  Average AHI 4.1 with mean CPAP 9 and 95 th percentile CPAP 12 cm H2O. ? ?Cardiac Tests:  ?Echo 09/30/19 >> EF 60 to 65%, mod LA dilation ? ?Social History:  ?She  reports that she quit smoking about 28 years ago. Her smoking use included cigarettes. She has a 90.00 pack-year smoking history. She has never used smokeless tobacco. She reports current alcohol use. She reports that she does not use drugs. ? ?Family History:  ?Her family history includes Coronary artery disease in her mother; Lung cancer in her father. ?  ? ? ?Assessment/Plan:  ? ?Allergic asthma. ?- add arnuity 100 one puff daily; inhaler technique demonstrated ?- prn albuterol ? ?Obstructive sleep apnea. ?- she is compliant with CPAP and reports benefit from therapy ?- she uses [employer] for her DME ?- continue auto CPAP 5 to 10 cm H2O ? ?Paroxysmal atrial fibrillation, Chronic diastolic CHF. ?- followed by Dr. Johnny Bridge with [facility] ? ?Time Spent Involved in Patient Care on Day of Examination:  ?26 minutes ? ?Follow up:  ? ?Patient Instructions  ?Arnuity one puff daily, and rinse your mouth after each use ? ?Albuterol two puffs every 6 hours as needed for cough, wheeze, shortness of breath or chest congestion ? ?Follow up in 4 months in [place] office ? ?Medication List:  ? ?Allergies as of 07/06/2021   ? ?  Reactions  ? Naproxen Sodium   ? Tightness in throat  ? ?  ? ?  ?Medication List  ?  ? ?  ? Accurate as of July 06, 2021 12:32 PM. If you have any questions, ask your nurse or doctor.  ?  ?  ? ?  ? ?albuterol 108 (90 Base) MCG/ACT inhaler ?Commonly known as: VENTOLIN HFA ?Inhale 2 puffs into the lungs every 6 (six) hours as needed for wheezing or shortness of breath. ?  ?Arnuity Ellipta 100 MCG/ACT Aepb ?Generic drug: Fluticasone Furoate ?Inhale 1 puff into the lungs daily in the  afternoon. ?Started by: Chesley Mires, MD ?  ?atorvastatin 10 MG tablet ?Commonly known as: LIPITOR ?Take 10 mg by mouth every Monday, Wednesday, and Friday at 8 PM. ?  ?bimatoprost 0.01 % Soln ?Commonly known as: LUMIGAN ?Place 1 drop into both eyes at bedtime. ?  ?BIOFREEZE ROLL-ON EX ?Apply 1 application topically 3 (three) times daily as needed (back pain.). ?  ?citalopram 20 MG tablet ?Commonly known as: CELEXA ?Take 20 mg by mouth daily. ?  ?Eliquis 5 MG Tabs tablet ?Generic drug: apixaban ?TAKE ONE TABLET ('5MG'$  TOTAL) BY MOUTH TWICE DAILY ?  ?levETIRAcetam 500 MG tablet ?Commonly known as: KEPPRA ?Take 250 mg by mouth 2 (two) times daily. ?  ?losartan 100 MG tablet ?Commonly known as: COZAAR ?Take 100 mg by mouth daily. ?  ?metoprolol tartrate 25 MG tablet ?Commonly known as: LOPRESSOR ?Take 37.5 mg by mouth 2 (two) times daily. ?  ?potassium chloride SA 20 MEQ tablet ?Commonly known as: KLOR-CON M ?TAKE ONE TABLET (20MEQ TOTAL) BY MOUTH DAILY ?  ?timolol 0.5 % ophthalmic solution ?Commonly known as: TIMOPTIC ?Place 1 drop into both eyes daily. ?  ?torsemide 20 MG tablet ?Commonly known as: DEMADEX ?TAKE THREE TABLETS BY MOUTH ONCE A DAY. ?  ?VITAMIN B-12 SL ?Place 1 tablet under the tongue daily. ?  ?vitamin C 1000 MG tablet ?Take 3,000 mg by mouth daily. ?  ?Vitamin D3 25 MCG tablet ?Commonly known as: Vitamin D ?Take 1,000 Units by mouth daily. ?  ? ?  ? ? ?Signature:  ?Chesley Mires, MD ?[place] ?Pager - [phone] ?07/06/2021, 12:32 PM ?  ? ? ? ? ? ? ? ? ?

## 2021-07-06 NOTE — Patient Instructions (Signed)
Arnuity one puff daily, and rinse your mouth after each use ? ?Albuterol two puffs every 6 hours as needed for cough, wheeze, shortness of breath or chest congestion ? ?Follow up in 4 months in [place] office ?

## 2021-07-13 DIAGNOSIS — H2511 Age-related nuclear cataract, right eye: Secondary | ICD-10-CM | POA: Diagnosis not present

## 2021-07-14 DIAGNOSIS — H2511 Age-related nuclear cataract, right eye: Secondary | ICD-10-CM | POA: Diagnosis not present

## 2021-07-14 DIAGNOSIS — H401113 Primary open-angle glaucoma, right eye, severe stage: Secondary | ICD-10-CM | POA: Diagnosis not present

## 2021-07-19 DIAGNOSIS — N1831 Chronic kidney disease, stage 3a: Secondary | ICD-10-CM | POA: Diagnosis not present

## 2021-07-19 DIAGNOSIS — Z79899 Other long term (current) drug therapy: Secondary | ICD-10-CM | POA: Diagnosis not present

## 2021-07-19 DIAGNOSIS — I5030 Unspecified diastolic (congestive) heart failure: Secondary | ICD-10-CM | POA: Diagnosis not present

## 2021-07-19 DIAGNOSIS — E1129 Type 2 diabetes mellitus with other diabetic kidney complication: Secondary | ICD-10-CM | POA: Diagnosis not present

## 2021-07-19 DIAGNOSIS — I480 Paroxysmal atrial fibrillation: Secondary | ICD-10-CM | POA: Diagnosis not present

## 2021-07-21 ENCOUNTER — Encounter: Payer: Self-pay | Admitting: Dermatology

## 2021-07-21 ENCOUNTER — Ambulatory Visit (INDEPENDENT_AMBULATORY_CARE_PROVIDER_SITE_OTHER): Payer: Medicare PPO | Admitting: Dermatology

## 2021-07-21 DIAGNOSIS — L570 Actinic keratosis: Secondary | ICD-10-CM

## 2021-07-21 DIAGNOSIS — L565 Disseminated superficial actinic porokeratosis (DSAP): Secondary | ICD-10-CM

## 2021-07-21 DIAGNOSIS — L304 Erythema intertrigo: Secondary | ICD-10-CM | POA: Diagnosis not present

## 2021-07-21 DIAGNOSIS — L821 Other seborrheic keratosis: Secondary | ICD-10-CM

## 2021-07-21 DIAGNOSIS — D0462 Carcinoma in situ of skin of left upper limb, including shoulder: Secondary | ICD-10-CM

## 2021-07-21 NOTE — Patient Instructions (Addendum)
Biopsy, Surgery (Curettage) & Surgery (Excision) Aftercare Instructions ? ?1. Okay to remove bandage in 24 hours ? ?2. Wash area with soap and water ? ?3. Apply Vaseline to area twice daily until healed (Not Neosporin) ? ?4. Okay to cover with a Band-Aid to decrease the chance of infection or prevent irritation from clothing; also it's okay to uncover lesion at home. ? ?5. Suture instructions: return to our office in 7-10 or 10-14 days for a nurse visit for suture removal. Variable healing with sutures, if pain or itching occurs call our office. It's okay to shower or bathe 24 hours after sutures are given. ? ?6. The following risks may occur after a biopsy, curettage or excision: bleeding, scarring, discoloration, recurrence, infection (redness, yellow drainage, pain or swelling). ? ?7. For questions, concerns and results call our office at [facility] before 4pm & Friday before 3pm. Biopsy results will be available in 1 week. ? ? ?Get Over The Counter Triple Paste Ointment.  ? ? ?Seborrheic Keratosis ?A seborrheic keratosis is a common, noncancerous (benign) skin growth. These growths are velvety, waxy, rough, tan, brown, or black spots that appear on the skin. These skin growths can be flat or raised, and scaly. ?What are the causes? ?The cause of this condition is not known. ?What increases the risk? ?You are more likely to develop this condition if you: ?Have a family history of seborrheic keratosis. ?Are 50 or older. ?Are pregnant. ?Have had estrogen replacement therapy. ?What are the signs or symptoms? ?Symptoms of this condition include growths on the face, chest, shoulders, back, or other areas. These growths: ?Are usually painless, but may become irritated and itchy. ?Can be yellow, brown, black, or other colors. ?Are slightly raised or have a flat surface. ?Are sometimes rough or wart-like in texture. ?Are often velvety or waxy on the surface. ?Are round or oval-shaped. ?Often occur in groups, but may  occur as a single growth. ?How is this diagnosed? ?This condition is diagnosed with a medical history and physical exam. ?A sample of the growth may be tested (skin biopsy). ?You may need to see a skin specialist (dermatologist). ?How is this treated? ?Treatment is not usually needed for this condition, unless the growths are irritated or bleed often. ?You may also choose to have the growths removed if you do not like their appearance. ?Most commonly, these growths are treated with a procedure in which liquid nitrogen is applied to "freeze" off the growth (cryosurgery). ?They may also be burned off with electricity (electrocautery) or removed by scraping (curettage). ?Follow these instructions at home: ?Watch your growth for any changes. ?Keep all follow-up visits as told by your health care provider. This is important. ?Do not scratch or pick at the growth or growths. This can cause them to become irritated or infected. ?Contact a health care provider if: ?You suddenly have many new growths. ?Your growth bleeds, itches, or hurts. ?Your growth suddenly becomes larger or changes color. ?Summary ?A seborrheic keratosis is a common, noncancerous (benign) skin growth. ?Treatment is not usually needed for this condition, unless the growths are irritated or bleed often. ?Watch your growth for any changes. ?Contact a health care provider if you suddenly have many new growths or your growth suddenly becomes larger or changes color. ?Keep all follow-up visits as told by your health care provider. This is important. ?This information is not intended to replace advice given to you by your health care provider. Make sure you discuss any questions you have with your  health care provider. ?Document Revised: 12/29/2020 Document Reviewed: 12/29/2020 ?Elsevier Patient Education ? [place]. ? ?

## 2021-07-26 DIAGNOSIS — E1122 Type 2 diabetes mellitus with diabetic chronic kidney disease: Secondary | ICD-10-CM | POA: Diagnosis not present

## 2021-07-26 DIAGNOSIS — N1831 Chronic kidney disease, stage 3a: Secondary | ICD-10-CM | POA: Diagnosis not present

## 2021-07-26 DIAGNOSIS — I5030 Unspecified diastolic (congestive) heart failure: Secondary | ICD-10-CM | POA: Diagnosis not present

## 2021-07-26 DIAGNOSIS — R7309 Other abnormal glucose: Secondary | ICD-10-CM | POA: Diagnosis not present

## 2021-08-01 DIAGNOSIS — E669 Obesity, unspecified: Secondary | ICD-10-CM | POA: Diagnosis not present

## 2021-08-01 DIAGNOSIS — Z79899 Other long term (current) drug therapy: Secondary | ICD-10-CM | POA: Diagnosis not present

## 2021-08-01 DIAGNOSIS — I4891 Unspecified atrial fibrillation: Secondary | ICD-10-CM | POA: Diagnosis not present

## 2021-08-01 DIAGNOSIS — G40309 Generalized idiopathic epilepsy and epileptic syndromes, not intractable, without status epilepticus: Secondary | ICD-10-CM | POA: Diagnosis not present

## 2021-08-01 DIAGNOSIS — G40209 Localization-related (focal) (partial) symptomatic epilepsy and epileptic syndromes with complex partial seizures, not intractable, without status epilepticus: Secondary | ICD-10-CM | POA: Diagnosis not present

## 2021-08-08 ENCOUNTER — Encounter: Payer: Self-pay | Admitting: Dermatology

## 2021-08-08 NOTE — Progress Notes (Signed)
Follow-Up Visit   Subjective  Kim Dixon is a 81 y.o. 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  2 biopsy-proven CIS on left arm plus rash and spots under breasts, growths on legs. Location:  Duration:  Quality:  Associated Signs/Symptoms: Modifying Factors:  Severity:  Timing: Context:   Objective  Well appearing patient in no apparent distress; mood and affect are within normal limits. Left Forearm Inferior Lesion identified by Dr.Adriano Bischof and nurse in room.    Left Forearm Superior Lesion identified by Dr.Alando Colleran and nurse in room.    Left Inframammary Fold, Right Inframammary Fold Erythema without pustules or satellite lesions inframammary fold.  Fits irritant intertrigo.  Left Inframammary Fold, Right Inframammary Fold Several tan slightly moist flattopped keratoses and inframammary folds.  Left Lower Leg - Anterior Gritty 5 mm pink crust  Left Lower Leg - Anterior, Right Lower Leg - Anterior Several 6 to 8 mm pink keratoses with sharp margins compatible with porokeratosis.    A full examination was performed including scalp, head, eyes, ears, nose, lips, neck, chest, axillae, abdomen, back, buttocks, bilateral upper extremities, bilateral lower extremities, hands, feet, fingers, toes, fingernails, and toenails. All findings within normal limits unless otherwise noted below.  Areas beneath undergarments not fully examined.   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 (2) Left Forearm Inferior  Destruction of lesion Complexity: simple   Destruction method: electrodesiccation and curettage   Informed consent: discussed and consent obtained   Timeout:  patient name, date of birth, surgical site, and procedure verified Anesthesia: the lesion was anesthetized in a standard fashion   Anesthetic:  1% lidocaine w/ epinephrine 1-100,000 local infiltration Curettage performed  in three different directions: Yes   Electrodesiccation performed over the curetted area: Yes   Curettage cycles:  3 Lesion length (cm):  1.2 Lesion width (cm):  1.2 Margin per side (cm):  0 Final wound size (cm):  1.2 Hemostasis achieved with:  ferric subsulfate and electrodesiccation Outcome: patient tolerated procedure well with no complications   Post-procedure details: sterile dressing applied and wound care instructions given   Dressing type: bandage and petrolatum   Additional details:  Wound innoculated with 5 fluorouracil solution.  Left Forearm Superior  Destruction of lesion Complexity: simple   Destruction method: electrodesiccation and curettage   Informed consent: discussed and consent obtained   Timeout:  patient name, date of birth, surgical site, and procedure verified Anesthesia: the lesion was anesthetized in a standard fashion   Anesthetic:  1% lidocaine w/ epinephrine 1-100,000 local infiltration Curettage performed in three different directions: Yes   Electrodesiccation performed over the curetted area: Yes   Curettage cycles:  3 Lesion length (cm):  1 Lesion width (cm):  1 Margin per side (cm):  0 Final wound size (cm):  1 Hemostasis achieved with:  ferric subsulfate and electrodesiccation Outcome: patient tolerated procedure well with no complications   Post-procedure details: sterile dressing applied and wound care instructions given   Dressing type: bandage and petrolatum   Additional details:  Wound innoculated with 5 fluorouracil solution.  Follow up in 6 months may use topical Aldara if there's any residual.   Erythema intertrigo Left Inframammary Fold; Right Inframammary Fold  OTC Triple Paste A/F daily after bathing for 1 to 2 weeks.  Contact me if this is not helping.  Seborrheic keratosis (2) Left Inframammary Fold; Right Inframammary Fold  Discourage  removal because of concurrent intertrigo.  AK (actinic keratosis) Left Lower Leg -  Anterior  Destruction of lesion - Left Lower Leg - Anterior Complexity: simple   Destruction method: cryotherapy   Informed consent: discussed and consent obtained   Timeout:  patient name, date of birth, surgical site, and procedure verified Lesion destroyed using liquid nitrogen: Yes   Cryotherapy cycles:  3 Outcome: patient tolerated procedure well with no complications   Post-procedure details: wound care instructions given    DSAP (disseminated superficial actinic porokeratosis) (2) Left Lower Leg - Anterior; Right Lower Leg - Anterior  Told of small risk of nonmelanoma skin cancer so return if there is growth or bleeding.      I, [name], MD, have reviewed all documentation for this visit.  The documentation on 08/08/21 for the exam, diagnosis, procedures, and orders are all accurate and complete.

## 2021-08-17 DIAGNOSIS — F329 Major depressive disorder, single episode, unspecified: Secondary | ICD-10-CM | POA: Diagnosis not present

## 2021-10-16 ENCOUNTER — Ambulatory Visit
Admission: EM | Admit: 2021-10-16 | Discharge: 2021-10-16 | Disposition: A | Discharge status: Home / Self Care | Payer: Medicare PPO | Attending: Urgent Care | Admitting: Urgent Care

## 2021-10-16 DIAGNOSIS — N183 Chronic kidney disease, stage 3 unspecified: Secondary | ICD-10-CM | POA: Diagnosis not present

## 2021-10-16 DIAGNOSIS — U071 COVID-19: Secondary | ICD-10-CM

## 2021-10-16 DIAGNOSIS — G40909 Epilepsy, unspecified, not intractable, without status epilepticus: Secondary | ICD-10-CM

## 2021-10-16 DIAGNOSIS — I10 Essential (primary) hypertension: Secondary | ICD-10-CM | POA: Diagnosis not present

## 2021-10-16 DIAGNOSIS — I480 Paroxysmal atrial fibrillation: Secondary | ICD-10-CM

## 2021-10-16 DIAGNOSIS — Z8701 Personal history of pneumonia (recurrent): Secondary | ICD-10-CM | POA: Diagnosis not present

## 2021-10-16 MED ORDER — MOLNUPIRAVIR EUA 200MG CAPSULE: 4.0000 | ORAL_CAPSULE | Freq: Two times a day (BID) | ORAL | 0 refills | Status: AC

## 2021-10-16 NOTE — ED Triage Notes (Signed)
Pt states that she has some diarrhea, sore throat, coughing, and nasal congestion. X2 days   Pt states that she is vaccinated for covid.

## 2021-10-16 NOTE — ED Provider Notes (Signed)
[place]   MRN: [id number] DOB: 1940-06-22  Subjective:   Kim Dixon is a 81 y.o. female presenting for 2-day history of acute onset sore throat, coughing, sinus congestion, fatigue, diarrhea.  Patient did a COVID test at home and was positive.  No chest pain, shortness of breath, wheezing.  No history of respiratory disorders.  Patient does have a history of paroxysmal atrial fibrillation, hypertension, TIA, seizure disorder.  She is very compliant with her medications.  She does also have a history of CKD stage III.   Current Facility-Administered Medications:    sodium chloride flush (NS) 0.9 % injection 3 mL, 3 mL, Intravenous, Q12H, Satira Sark, MD  Current Outpatient Medications:    albuterol (VENTOLIN HFA) 108 (90 Base) MCG/ACT inhaler, Inhale 2 puffs into the lungs every 6 (six) hours as needed for wheezing or shortness of breath., Disp: 8 g, Rfl: 2   apixaban (ELIQUIS) 5 MG TABS tablet, TAKE ONE TABLET ('5MG'$  TOTAL) BY MOUTH TWICE DAILY, Disp: 180 tablet, Rfl: 1   Ascorbic Acid (VITAMIN C) 1000 MG tablet, Take 3,000 mg by mouth daily. , Disp: , Rfl:    atorvastatin (LIPITOR) 10 MG tablet, Take 10 mg by mouth every Monday, Wednesday, and Friday at 8 PM. , Disp: , Rfl:    bimatoprost (LUMIGAN) 0.01 % SOLN, Place 1 drop into both eyes at bedtime., Disp: , Rfl:    citalopram (CELEXA) 20 MG tablet, Take 20 mg by mouth daily. , Disp: , Rfl:    Cyanocobalamin (VITAMIN B-12 SL), Place 1 tablet under the tongue daily., Disp: , Rfl:    Fluticasone Furoate (ARNUITY ELLIPTA) 100 MCG/ACT AEPB, Inhale 1 puff into the lungs daily in the afternoon., Disp: 30 each, Rfl: 5   levETIRAcetam (KEPPRA) 500 MG tablet, Take 250 mg by mouth 2 (two) times daily., Disp: , Rfl:    losartan (COZAAR) 100 MG tablet, Take 100 mg by mouth daily., Disp: , Rfl:    Menthol, Topical Analgesic, (BIOFREEZE ROLL-ON EX), Apply 1 application topically 3 (three) times daily as needed (back  pain.)., Disp: , Rfl:    metoprolol tartrate (LOPRESSOR) 25 MG tablet, Take 37.5 mg by mouth 2 (two) times daily., Disp: , Rfl:    potassium chloride SA (KLOR-CON M) 20 MEQ tablet, TAKE ONE TABLET (20MEQ TOTAL) BY MOUTH DAILY, Disp: 90 tablet, Rfl: 2   timolol (TIMOPTIC) 0.5 % ophthalmic solution, Place 1 drop into both eyes daily., Disp: , Rfl:    torsemide (DEMADEX) 20 MG tablet, TAKE THREE TABLETS BY MOUTH ONCE A DAY., Disp: 270 tablet, Rfl: 2   Vitamin D3 (VITAMIN D) 25 MCG tablet, Take 1,000 Units by mouth daily., Disp: , Rfl:    Allergies  Allergen Reactions   Naproxen Sodium     Tightness in throat    Past Medical History:  Diagnosis Date   Anxiety    Arthritis    Basal cell carcinoma    RIGHT THIGH BCC CX3 5FU   Basal cell carcinoma 12/16/2019   right anterior neck (CX35FU)   Depression    Diverticulosis    Essential hypertension    Glaucoma    Hematuria    History of kidney stones    History of pneumonia    Hyperlipidemia    PAF (paroxysmal atrial fibrillation) (HCC)    Pneumonia    SCC (squamous cell carcinoma) 2023   left forearm inf. tx cx3 23f   Squamous cell carcinoma of skin 2023   left  forearm sup tx cx3 54f   Stress incontinence    TIA (transient ischemic attack)    January 2020   Urinary frequency    Varicose veins      Past Surgical History:  Procedure Laterality Date   ABDOMINAL HYSTERECTOMY     ABLATION SAPHENOUS VEIN W/ RFA Bilateral    COLONOSCOPY     COLONOSCOPY N/A 06/18/2013   Procedure: COLONOSCOPY;  Surgeon: NRogene Houston MD;  [facility];  Service: Endoscopy;  Laterality: N/A;  [place] URETEROSCOPY AND STENT PLACEMENT Left 10/18/2018   Procedure: CYSTOSCOPY WITH RETROGRADE PYELOGRAM, URETEROSCOPY AND STENT PLACEMENT;  Surgeon: MAlexis Frock MD;  Location: [facility];  Service: Urology;  Laterality: Left;  1 HR   EYE SURGERY     laser surgery bilat    HOLMIUM LASER APPLICATION Left  76/73/4193  Procedure: HOLMIUM LASER APPLICATION;  Surgeon: MAlexis Frock MD;  Location: [facility];  Service: Urology;  Laterality: Left;   LEFT HEART CATH AND CORONARY ANGIOGRAPHY N/A 11/28/2019   Procedure: LEFT HEART CATH AND CORONARY ANGIOGRAPHY;  Surgeon: AWellington Hampshire MD;  [facility];  Service: Cardiovascular;  Laterality: N/A;   LITHOTRIPSY     MASS EXCISION N/A 12/07/2017   Procedure: EXCISION 3CM CYST ON BACK;  Surgeon: JAviva Signs MD;  Location: [facility];  Service: General;  Laterality: N/A;   PARATHYROIDECTOMY Left 04/22/2019   Procedure: LEFT INFERIOR PARATHYROIDECTOMY;  Surgeon: GArmandina Gemma MD;  Location: [facility];  Service: General;  Laterality: Left;   TONSILLECTOMY     TOTAL KNEE ARTHROPLASTY Left 08/25/2014   Procedure: LEFT TOTAL KNEE ARTHROPLASTY;  Surgeon: MParalee Cancel MD;  Location: [facility];  Service: Orthopedics;  Laterality: Left;    Family History  Problem Relation Age of Onset   Coronary artery disease Mother    Lung cancer Father    Colon cancer Neg Hx    Stomach cancer Neg Hx     Social History   Tobacco Use   Smoking status: Former    Packs/day: 3.00    Years: 30.00    Total pack years: 90.00    Types: Cigarettes    Quit date: 04/04/1993    Years since quitting: 28.5   Smokeless tobacco: Never  Vaping Use   Vaping Use: Never used  Substance Use Topics   Alcohol use: Yes    Comment: Nightly glass of wine   Drug use: No    ROS   Objective:   Vitals: BP (!) 129/52 (BP Location: Right Arm)   Pulse 90   Temp 98.2 F (36.8 C) (Oral)   Resp 18   Ht 5' 3.5" (1.613 m)   Wt 260 lb (117.9 kg)   SpO2 95%   BMI 45.33 kg/m   Physical Exam Constitutional:      General: She is not in acute distress.    Appearance: Normal appearance. She is well-developed. She is obese. She is not ill-appearing, toxic-appearing or diaphoretic.  HENT:     Head: Normocephalic and atraumatic.     Nose: Congestion present. No rhinorrhea.      Mouth/Throat:     Mouth: Mucous membranes are moist.  Eyes:     General: No scleral icterus.       Right eye: No discharge.        Left eye: No discharge.     Extraocular Movements: Extraocular movements intact.  Cardiovascular:     Rate and  Rhythm: Normal rate and regular rhythm.     Heart sounds: Normal heart sounds. No murmur heard.    No friction rub. No gallop.  Pulmonary:     Effort: Pulmonary effort is normal. No respiratory distress.     Breath sounds: No stridor. No wheezing, rhonchi or rales.  Chest:     Chest wall: No tenderness.  Skin:    General: Skin is warm and dry.  Neurological:     General: No focal deficit present.     Mental Status: She is alert and oriented to person, place, and time.  Psychiatric:        Mood and Affect: Mood normal.        Behavior: Behavior normal.     Assessment and Plan :   PDMP not reviewed this encounter.  1. Clinical diagnosis of COVID-19   2. Paroxysmal atrial fibrillation (HCC)   3. History of pneumonia   4. Seizure disorder ([place])   5. Essential hypertension   6. Stage 3 chronic kidney disease, unspecified whether stage 3a or 3b CKD ([place])    Given her medications, CKD, I advised against using Paxlovid.  Patient is very agreeable to this.  Recommended molnupiravir as her COVID antiviral.  Use supportive care otherwise. Deferred imaging given clear cardiopulmonary exam, hemodynamically stable vital signs. Counseled patient on potential for adverse effects with medications prescribed/recommended today, ER and return-to-clinic precautions discussed, patient verbalized understanding.    Jaynee Eagles, [place] 10/17/21 [phone]

## 2021-10-17 LAB — NOVEL CORONAVIRUS, NAA: SARS-CoV-2, NAA: DETECTED — AB

## 2021-10-26 ENCOUNTER — Ambulatory Visit: Payer: Medicare PPO | Admitting: Dermatology

## 2021-11-10 ENCOUNTER — Other Ambulatory Visit: Payer: Self-pay | Admitting: Cardiology

## 2021-11-10 NOTE — Telephone Encounter (Signed)
Prescription refill request for Eliquis received. Indication: PAF Last office visit: 03/24/21  Myles Gip MD Scr: 1.20 on 04/21/21 Age:  81 Weight: 122.9kg  Based on above findings Eliquis '5mg'$  twice daily is the appropriate dose.  Refill approved.

## 2021-11-22 ENCOUNTER — Encounter: Payer: Self-pay | Admitting: Pulmonary Disease

## 2021-11-22 ENCOUNTER — Ambulatory Visit (INDEPENDENT_AMBULATORY_CARE_PROVIDER_SITE_OTHER): Payer: Medicare PPO | Admitting: Pulmonary Disease

## 2021-11-22 VITALS — BP 128/68 | HR 58 | Temp 97.7°F | Ht 63.5 in | Wt 267.0 lb

## 2021-11-22 DIAGNOSIS — E669 Obesity, unspecified: Secondary | ICD-10-CM

## 2021-11-22 DIAGNOSIS — J4540 Moderate persistent asthma, uncomplicated: Secondary | ICD-10-CM

## 2021-11-22 DIAGNOSIS — G4733 Obstructive sleep apnea (adult) (pediatric): Secondary | ICD-10-CM

## 2021-11-22 DIAGNOSIS — G4730 Sleep apnea, unspecified: Secondary | ICD-10-CM

## 2021-11-22 MED ORDER — FLUTICASONE PROPIONATE 50 MCG/ACT NA SUSP: 1.0000 | Freq: Every day | NASAL | Status: DC | PRN

## 2021-11-22 MED ORDER — ARNUITY ELLIPTA 100 MCG/ACT IN AEPB
1.0000 | INHALATION_SPRAY | Freq: Every day | RESPIRATORY_TRACT | 3 refills | Status: DC
Start: 2021-11-22 — End: 2022-06-05

## 2021-11-22 MED ORDER — AZELASTINE HCL 0.15 % NA SOLN: 1.0000 | Freq: Every day | NASAL | Status: DC | PRN

## 2021-11-22 NOTE — Progress Notes (Signed)
[facility]  Chief Complaint  Patient presents with   Follow-up    Has not used CPAP since July since she had covid     Constitutional:  BP 128/68 (BP Location: Left Arm, Patient Position: Sitting)   Pulse (!) 58   Temp 97.7 F (36.5 C) (Temporal)   Ht 5' 3.5" (1.613 m)   Wt 267 lb (121.1 kg)   SpO2 96% Comment: ra  BMI 46.56 kg/m   Past Medical History:  Anxiety, OA, Depression, Diverticulosis, HTN, Glaucoma, Nephrolithiasis, PNA, HLD, PAF, TIA, Varicose veins  Past Surgical History:  She  has a past surgical history that includes Abdominal hysterectomy; Colonoscopy; Colonoscopy (N/A, 06/18/2013); Lithotripsy; Ablation saphenous vein w/ RFA (Bilateral); Eye surgery; Tonsillectomy; Total knee arthroplasty (Left, 08/25/2014); Mass excision (N/A, 12/07/2017); Cystoscopy with retrograde pyelogram, ureteroscopy and stent placement (Left, 10/18/2018); Holmium laser application (Left, 09/14/3149); Parathyroidectomy (Left, 04/22/2019); and LEFT HEART CATH AND CORONARY ANGIOGRAPHY (N/A, 11/28/2019).  Brief Summary:  Kim Dixon is a 81 y.o. female with obstructive sleep apnea and asthma.      Subjective:   She got COVID in July while at the gym.  Slowly improving.  Still has sinus congestion with post nasal drip.  This causes a cough.  Not having chest congestion now.  Not having wheeze.  She feels that arnuity has been working well.  Hasn't needed albuterol as much  She wasn't able to use CPAP when she had COVID due to cough and sinus congestion.  She plans to resume using CPAP.  Physical Exam:   Appearance - well kempt   ENMT - no sinus tenderness, no oral exudate, no LAN, Mallampati 3 airway, no stridor  Respiratory - equal breath sounds bilaterally, no wheezing or rales  CV - s1s2 regular rate and rhythm, no murmurs  Ext - no clubbing, no edema  Skin - no rashes  Psych - normal mood and affect     Pulmonary Tests:  PFT  07/06/21 >> FEV1 1.23 (64%), FEV1% 83, TLC 4.19 (84%), DLCO 105%, +BD  Sleep Tests:  PSG 10/26/20 >> AHI 49.7, SpO2 low 77% Auto CPAP 02/17/21 to 05/17/21 >> used on 63 of 90 nights with average 4 hours.  Average AHI 4.1 with mean CPAP 9 and 95 th percentile CPAP 12 cm H2O.  Cardiac Tests:  Echo 09/30/19 >> EF 60 to 65%, mod LA dilation  Social History:  She  reports that she quit smoking about 28 years ago. Her smoking use included cigarettes. She has a 90.00 pack-year smoking history. She has never used smokeless tobacco. She reports current alcohol use. She reports that she does not use drugs.  Family History:  Her family history includes Coronary artery disease in her mother; Lung cancer in her father.     Assessment/Plan:   Allergic asthma. - continue arnuity 100 one puff daily; 3 month supply sent - prn albuterol  Allergic rhinitis with post nasal drip. - prn OTC flonase and astepro  Obstructive sleep apnea. - she is compliant with CPAP and reports benefit from therapy - she uses [employer] for her DME - current CPAP ordered September 2022 - continue auto CPAP 5 to 10 cm H2O  Paroxysmal atrial fibrillation, Chronic diastolic CHF. - followed by Dr. Johnny Bridge with [facility]  Time Spent Involved in Patient Care on Day of Examination:  26 minutes  Follow up:   Patient Instructions  Follow up in 6 months  Medication List:  Allergies as of 11/22/2021       Reactions   Naproxen Sodium    Tightness in throat        Medication List        Accurate as of November 22, 2021  1:24 PM. If you have any questions, ask your nurse or doctor.          albuterol 108 (90 Base) MCG/ACT inhaler Commonly known as: VENTOLIN HFA Inhale 2 puffs into the lungs every 6 (six) hours as needed for wheezing or shortness of breath.   Arnuity Ellipta 100 MCG/ACT Aepb Generic drug: Fluticasone Furoate Inhale 1 puff into the lungs daily in the afternoon.   atorvastatin 10 MG  tablet Commonly known as: LIPITOR Take 10 mg by mouth every Monday, Wednesday, and Friday at 8 PM.   Azelastine HCl 0.15 % Soln Commonly known as: Astepro Place 1 spray into the nose daily as needed. Started by: Chesley Mires, MD   bimatoprost 0.01 % Soln Commonly known as: LUMIGAN Place 1 drop into both eyes at bedtime.   BIOFREEZE ROLL-ON EX Apply 1 application topically 3 (three) times daily as needed (back pain.).   citalopram 20 MG tablet Commonly known as: CELEXA Take 20 mg by mouth daily.   Eliquis 5 MG Tabs tablet Generic drug: apixaban TAKE ONE TABLET ('5MG'$  TOTAL) BY MOUTH TWICE DAILY   fluticasone 50 MCG/ACT nasal spray Commonly known as: FLONASE Place 1 spray into both nostrils daily as needed for allergies or rhinitis. Started by: Chesley Mires, MD   levETIRAcetam 500 MG tablet Commonly known as: KEPPRA Take 250 mg by mouth 2 (two) times daily.   losartan 100 MG tablet Commonly known as: COZAAR Take 100 mg by mouth daily.   metoprolol tartrate 25 MG tablet Commonly known as: LOPRESSOR Take 37.5 mg by mouth 2 (two) times daily.   potassium chloride SA 20 MEQ tablet Commonly known as: KLOR-CON M TAKE ONE TABLET (20MEQ TOTAL) BY MOUTH DAILY   timolol 0.5 % ophthalmic solution Commonly known as: TIMOPTIC Place 1 drop into both eyes daily.   torsemide 20 MG tablet Commonly known as: DEMADEX TAKE THREE TABLETS BY MOUTH ONCE A DAY.   VITAMIN B-12 SL Place 1 tablet under the tongue daily.   vitamin C 1000 MG tablet Take 3,000 mg by mouth daily.   vitamin D3 25 MCG tablet Commonly known as: CHOLECALCIFEROL Take 1,000 Units by mouth daily.        Signature:  Chesley Mires, MD [facility] Negron Torres Pager - [phone] 11/22/2021, 1:24 PM

## 2021-11-22 NOTE — Patient Instructions (Signed)
Follow up in 6 months 

## 2021-11-28 DIAGNOSIS — H401133 Primary open-angle glaucoma, bilateral, severe stage: Secondary | ICD-10-CM | POA: Diagnosis not present

## 2021-11-28 DIAGNOSIS — Z961 Presence of intraocular lens: Secondary | ICD-10-CM | POA: Diagnosis not present

## 2021-11-28 DIAGNOSIS — H02043 Spastic entropion of right eye, unspecified eyelid: Secondary | ICD-10-CM | POA: Diagnosis not present

## 2021-11-28 DIAGNOSIS — H02046 Spastic entropion of left eye, unspecified eyelid: Secondary | ICD-10-CM | POA: Diagnosis not present

## 2021-12-06 ENCOUNTER — Telehealth: Payer: Self-pay | Admitting: *Deleted

## 2021-12-06 NOTE — Patient Outreach (Signed)
  Care Coordination   Initial Visit Note   12/06/2021 Name: Kim Dixon MRN: [id number] DOB: April 07, 1940  Kim Dixon is a 81 y.o. year old female who sees Asencion Noble, MD for primary care. I spoke with  [name] by phone today.  What matters to the patients health and wellness today?  State she completed assessment of needs with [facility] team at PCP office on 8/22.  Denies any further needs.     SDOH assessments and interventions completed:  No     Care Coordination Interventions Activated:  No  Care Coordination Interventions:  No, not indicated   Follow up plan: No further intervention required.   Encounter Outcome:  Pt. Visit Completed   Valente David, RN, MSN, [facility] Care Management Coordinator [phone]

## 2022-01-10 DIAGNOSIS — Z23 Encounter for immunization: Secondary | ICD-10-CM | POA: Diagnosis not present

## 2022-01-10 DIAGNOSIS — I5030 Unspecified diastolic (congestive) heart failure: Secondary | ICD-10-CM | POA: Diagnosis not present

## 2022-01-10 DIAGNOSIS — F329 Major depressive disorder, single episode, unspecified: Secondary | ICD-10-CM | POA: Diagnosis not present

## 2022-01-23 DIAGNOSIS — G40309 Generalized idiopathic epilepsy and epileptic syndromes, not intractable, without status epilepticus: Secondary | ICD-10-CM | POA: Diagnosis not present

## 2022-01-23 DIAGNOSIS — E669 Obesity, unspecified: Secondary | ICD-10-CM | POA: Diagnosis not present

## 2022-01-23 DIAGNOSIS — I4891 Unspecified atrial fibrillation: Secondary | ICD-10-CM | POA: Diagnosis not present

## 2022-01-23 DIAGNOSIS — G40209 Localization-related (focal) (partial) symptomatic epilepsy and epileptic syndromes with complex partial seizures, not intractable, without status epilepticus: Secondary | ICD-10-CM | POA: Diagnosis not present

## 2022-01-27 ENCOUNTER — Other Ambulatory Visit: Payer: Self-pay | Admitting: Cardiology

## 2022-02-13 DIAGNOSIS — Z79899 Other long term (current) drug therapy: Secondary | ICD-10-CM | POA: Diagnosis not present

## 2022-02-13 DIAGNOSIS — N1831 Chronic kidney disease, stage 3a: Secondary | ICD-10-CM | POA: Diagnosis not present

## 2022-02-13 DIAGNOSIS — E1129 Type 2 diabetes mellitus with other diabetic kidney complication: Secondary | ICD-10-CM | POA: Diagnosis not present

## 2022-02-13 DIAGNOSIS — F32A Depression, unspecified: Secondary | ICD-10-CM | POA: Diagnosis not present

## 2022-02-13 DIAGNOSIS — I5030 Unspecified diastolic (congestive) heart failure: Secondary | ICD-10-CM | POA: Diagnosis not present

## 2022-02-14 IMAGING — CT CT HEAD W/O CM
3 series · 16 of 47 positions shown, 19 images · non-contrast
Comparison: None.

CLINICAL DATA: Seizure

EXAM:
CT HEAD WITHOUT CONTRAST
TECHNIQUE: Contiguous axial images were obtained from the base of the skull
through the vertex without intravenous contrast.

[Series 2: head w o · axial · 0.43mm/px · z∈[+1331,+1461]mm · 10 of 32 slices shown, 13 images]
[im 3/32  brain]
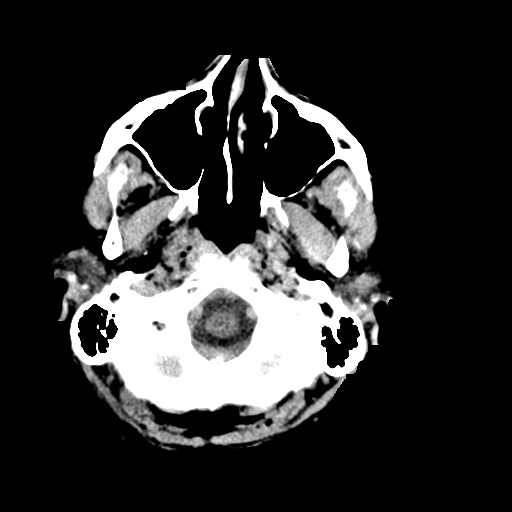
[im 3/32  bone]
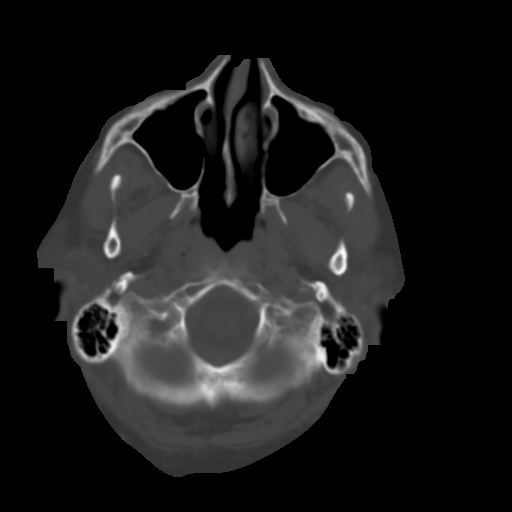
[im 6/32  brain]
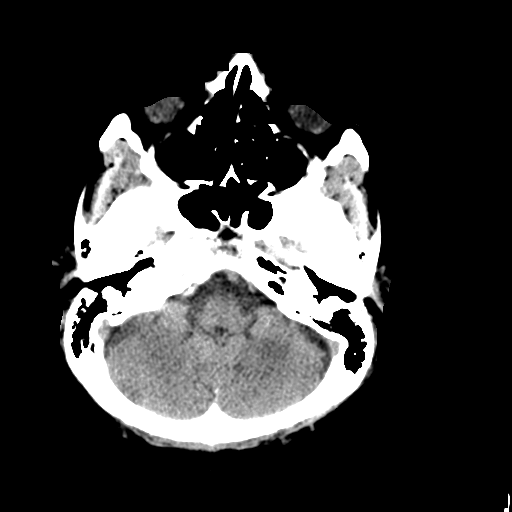
[im 9/32  brain]
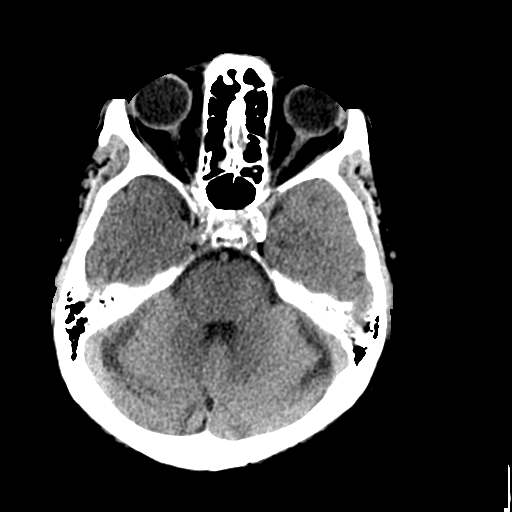
[im 11/32  brain]
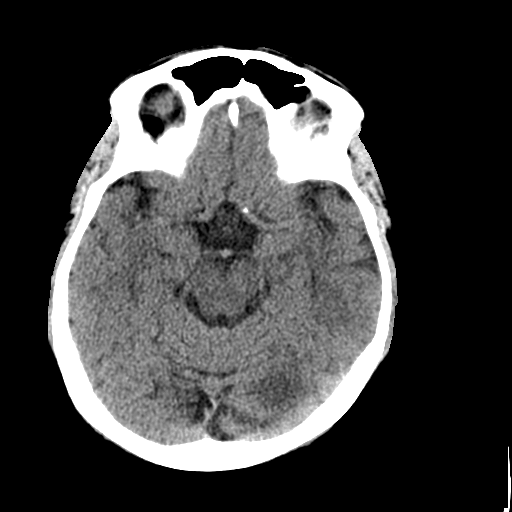
[im 14/32  brain]
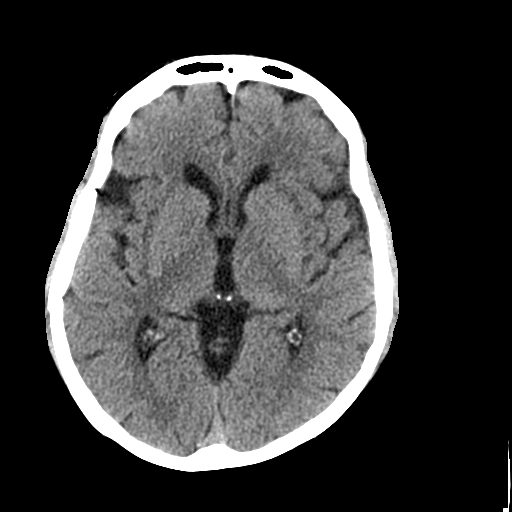
[im 14/32  bone]
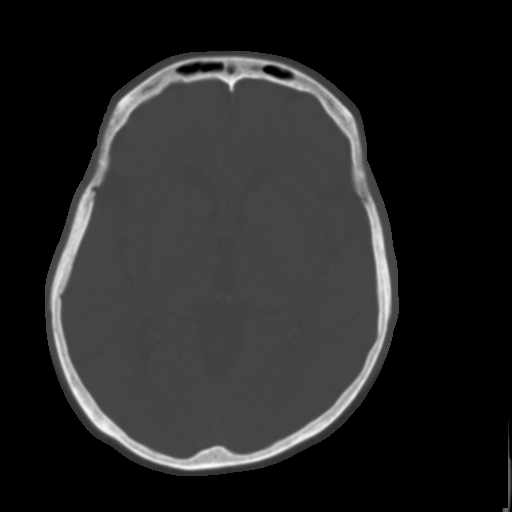
[im 18/32  brain]
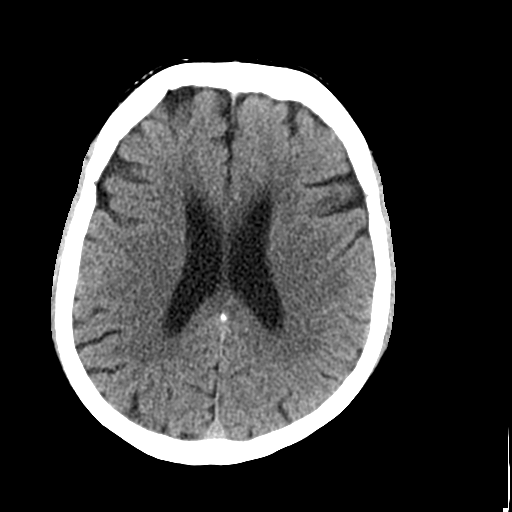
[im 21/32  brain]
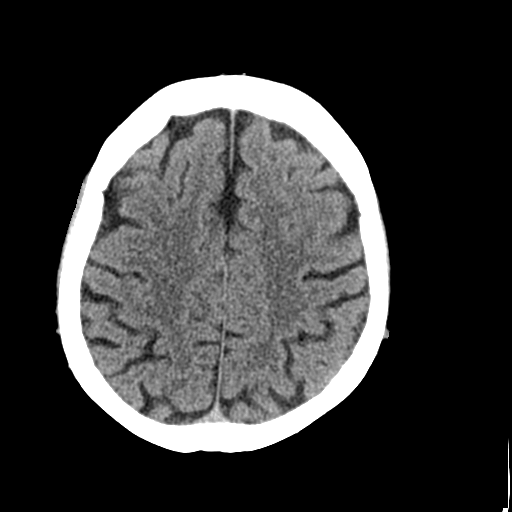
[im 24/32  brain]
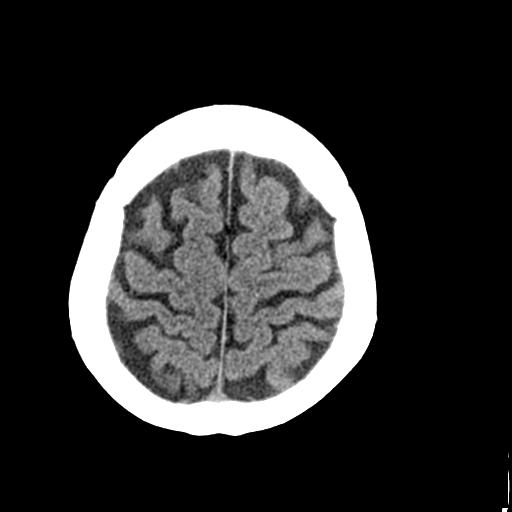
[im 26/32  brain]
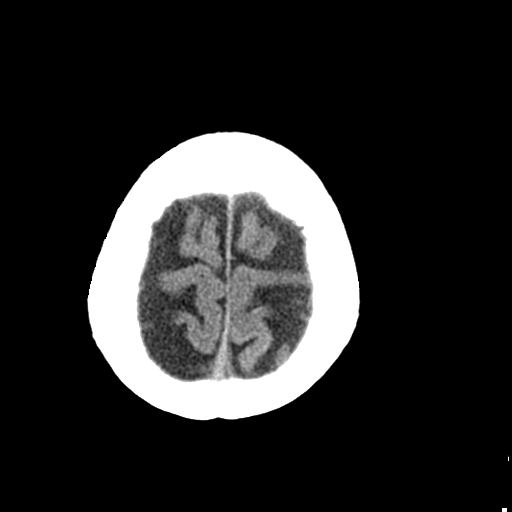
[im 26/32  bone]
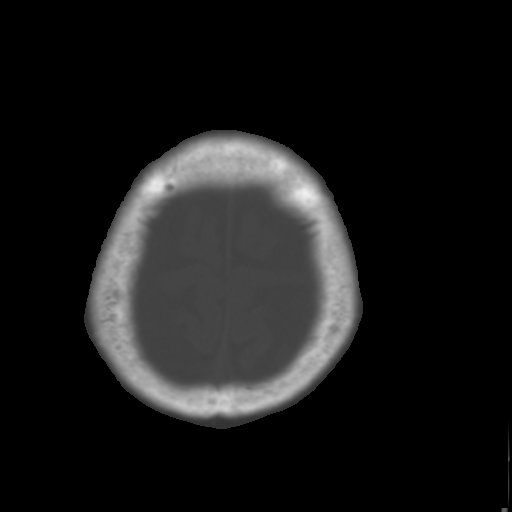
[im 29/32  brain]
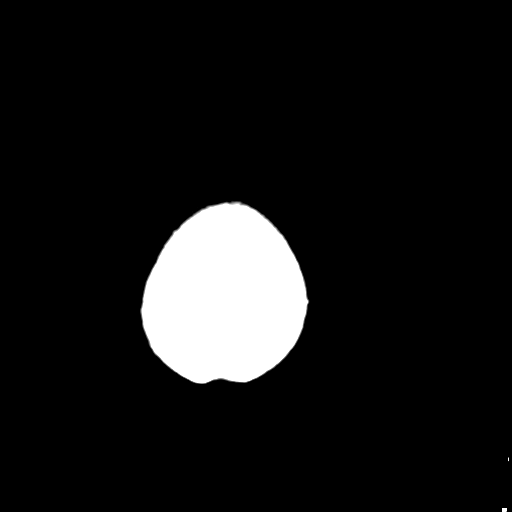

[Series 4: coronal soft · coronal · 0.33mm/px · 3 of 67 slices shown]
[im 23/67  brain]
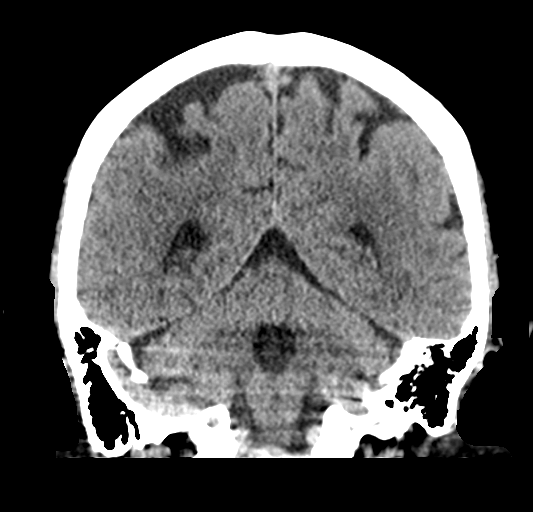
[im 30/67  brain]
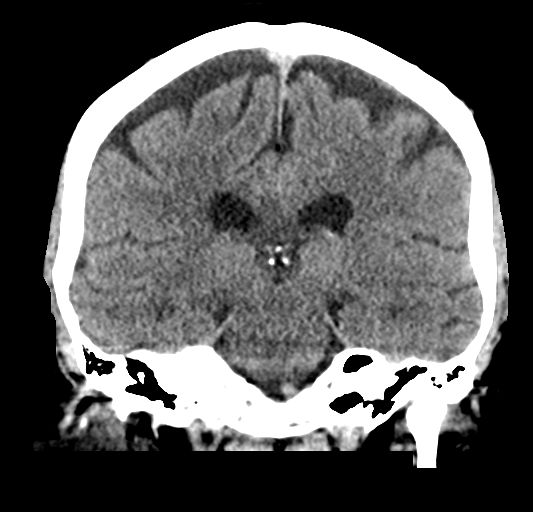
[im 37/67  brain]
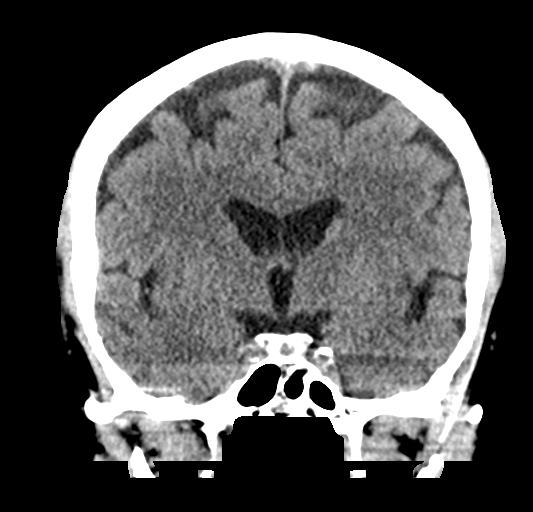

[Series 5: sagittal soft · sagittal · 0.33mm/px · 3 of 57 slices shown]
[im 19/57  brain]
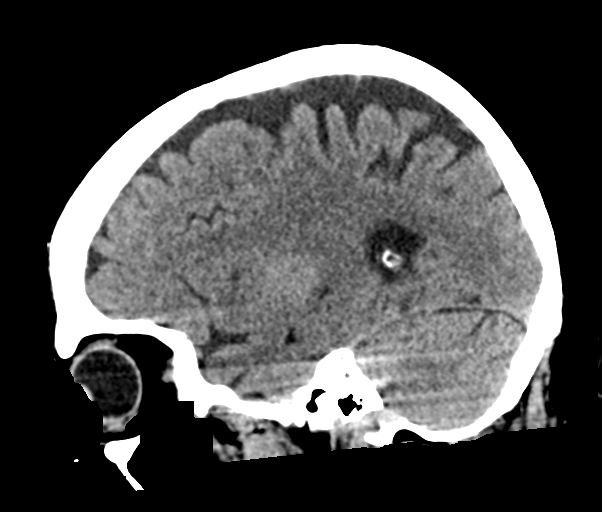
[im 29/57  brain]
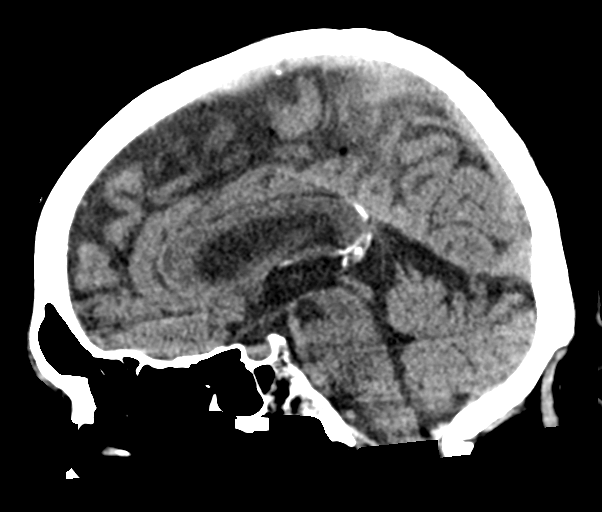
[im 38/57  brain]
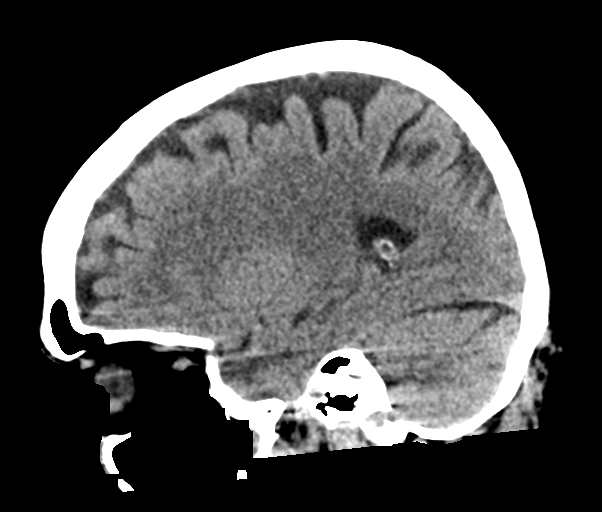

[16 of 47 positions shown; findings below may reference images not displayed]

FINDINGS: Brain: There is atrophy and chronic small vessel disease changes. No
acute intracranial abnormality. Specifically, no hemorrhage,
hydrocephalus, mass lesion, acute infarction, or significant
intracranial injury.

Vascular: No hyperdense vessel or unexpected calcification.

Skull: No acute calvarial abnormality.

Sinuses/Orbits: No acute findings

Other: None
IMPRESSION: Atrophy, chronic microvascular disease.

No acute intracranial abnormality.

## 2022-02-15 NOTE — Progress Notes (Unsigned)
Cardiology Office Note  Date: 02/16/2022   ID: Kim Dixon, DOB 11-09-1940, MRN [id number]  PCP:  Asencion Noble, MD  Cardiologist:  Rozann Lesches, MD Electrophysiologist:  None   Chief Complaint  Patient presents with   Cardiac follow-up    History of Present Illness: Kim Dixon is an 81 y.o. female last seen in January.  She is here for a follow-up visit.  States that she had somewhat of a rough year, COVID-19 earlier, and more recently another viral syndrome.  Has had some palpitations during this time, but otherwise no progressive breakthrough atrial fibrillation.  She is in sinus bradycardia by ECG which I personally reviewed today.  I reviewed her medications, she reports no spontaneous bleeding problems on Eliquis.  Recent lab work reviewed below.  Creatinine is up to 1.57.  LDL 71.  Hemoglobin normal at 13.1.  Lopressor is currently 25 mg twice daily.  No change in diuretic regimen and weight is down 4 pounds compared to September.  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  Past Medical History:  Diagnosis Date   Anxiety    Arthritis    Basal cell carcinoma    RIGHT THIGH BCC CX3 5FU   Basal cell carcinoma 12/16/2019   right anterior neck (CX35FU)   Depression    Diverticulosis    Essential hypertension    Glaucoma    Hematuria    History of kidney stones    History of pneumonia    Hyperlipidemia    PAF (paroxysmal atrial fibrillation) (HCC)    Pneumonia    SCC (squamous cell carcinoma) 2023   left forearm inf. tx cx3 59f   Squamous cell carcinoma of skin 2023   left forearm sup tx cx3 539f  Stress incontinence    TIA (transient ischemic attack)    January 2020   Urinary frequency    Varicose veins     Past Surgical History:  Procedure Laterality Date   ABDOMINAL HYSTERECTOMY     ABLATION SAPHENOUS VEIN W/ RFA Bilateral    CATARACT EXTRACTION Bilateral    COLONOSCOPY     COLONOSCOPY N/A 06/18/2013   Procedure:  COLONOSCOPY;  Surgeon: NaRogene HoustonMD;  [facility]: [facility];  Service: Endoscopy;  Laterality: N/A;  83ColemanURETEROSCOPY AND STENT PLACEMENT Left 10/18/2018   Procedure: CYSTOSCOPY WITH RETROGRADE PYELOGRAM, URETEROSCOPY AND STENT PLACEMENT;  Surgeon: MaAlexis FrockMD;  Location: [facility];  Service: Urology;  Laterality: Left;  1 HR   EYE SURGERY     laser surgery bilat    HOLMIUM LASER APPLICATION Left 0724/11/7351 Procedure: HOLMIUM LASER APPLICATION;  Surgeon: MaAlexis FrockMD;  Location: [facility];  Service: Urology;  Laterality: Left;   LEFT HEART CATH AND CORONARY ANGIOGRAPHY N/A 11/28/2019   Procedure: LEFT HEART CATH AND CORONARY ANGIOGRAPHY;  Surgeon: ArWellington HampshireMD;  [facility];  Service: Cardiovascular;  Laterality: N/A;   LITHOTRIPSY     MASS EXCISION N/A 12/07/2017   Procedure: EXCISION 3CM CYST ON BACK;  Surgeon: JeAviva SignsMD;  Location: [facility];  Service: General;  Laterality: N/A;   PARATHYROIDECTOMY Left 04/22/2019   Procedure: LEFT INFERIOR PARATHYROIDECTOMY;  Surgeon: GeArmandina GemmaMD;  Location: [facility];  Service: General;  Laterality: Left;   TONSILLECTOMY     TOTAL KNEE ARTHROPLASTY Left 08/25/2014   Procedure: LEFT TOTAL KNEE ARTHROPLASTY;  Surgeon: MaParalee CancelMD;  Location: [facility];  Service: Orthopedics;  Laterality: Left;    Current Outpatient Medications  Medication Sig Dispense Refill   albuterol (VENTOLIN HFA) 108 (90 Base) MCG/ACT inhaler Inhale 2 puffs into the lungs every 6 (six) hours as needed for wheezing or shortness of breath. 8 g 2   Ascorbic Acid (VITAMIN C) 1000 MG tablet Take 3,000 mg by mouth daily.      atorvastatin (LIPITOR) 10 MG tablet Take 10 mg by mouth every Monday, Wednesday, and Friday at 8 PM.      Azelastine HCl (ASTEPRO) 0.15 % SOLN Place 1 spray into the nose daily as needed.     bimatoprost (LUMIGAN) 0.01 % SOLN Place 1 drop into both eyes at bedtime.      citalopram (CELEXA) 20 MG tablet Take 20 mg by mouth daily.      Cyanocobalamin (VITAMIN B-12 SL) Place 1 tablet under the tongue daily.     escitalopram (LEXAPRO) 10 MG tablet Take 10 mg by mouth daily.     fluticasone (FLONASE) 50 MCG/ACT nasal spray Place 1 spray into both nostrils daily as needed for allergies or rhinitis.     Fluticasone Furoate (ARNUITY ELLIPTA) 100 MCG/ACT AEPB Inhale 1 puff into the lungs daily in the afternoon. 90 each 3   levETIRAcetam (KEPPRA) 500 MG tablet Take 250 mg by mouth 2 (two) times daily.     losartan (COZAAR) 100 MG tablet Take 100 mg by mouth daily.     Menthol, Topical Analgesic, (BIOFREEZE ROLL-ON EX) Apply 1 application topically 3 (three) times daily as needed (back pain.).     metoprolol tartrate (LOPRESSOR) 25 MG tablet Take 37.5 mg by mouth 2 (two) times daily.     potassium chloride SA (KLOR-CON M) 20 MEQ tablet TAKE ONE TABLET (20MEQ TOTAL) BY MOUTH DAILY 90 tablet 2   timolol (TIMOPTIC) 0.5 % ophthalmic solution Place 1 drop into both eyes daily.     torsemide (DEMADEX) 20 MG tablet TAKE THREE TABLETS BY MOUTH ONCE A DAY. 270 tablet 2   Vitamin D3 (VITAMIN D) 25 MCG tablet Take 1,000 Units by mouth daily.     apixaban (ELIQUIS) 2.5 MG TABS tablet Take 1 tablet (2.5 mg total) by mouth 2 (two) times daily. 180 tablet 3   Current Facility-Administered Medications  Medication Dose Route Frequency Provider Last Rate Last Admin   sodium chloride flush (NS) 0.9 % injection 3 mL  3 mL Intravenous Q12H Satira Sark, MD       Allergies:  Naproxen sodium   ROS: No dizziness or syncope.  Physical Exam: VS:  BP (!) 114/56   Pulse (!) 58   Ht 5' 3.5" (1.613 m)   Wt 263 lb (119.3 kg)   SpO2 94%   BMI 45.86 kg/m , BMI Body mass index is 45.86 kg/m.  Wt Readings from Last 3 Encounters:  02/16/22 263 lb (119.3 kg)  11/22/21 267 lb (121.1 kg)  10/16/21 260 lb (117.9 kg)    General: Patient appears comfortable at rest. HEENT: Conjunctiva and  lids normal. Neck: Supple, no elevated JVP or carotid bruits. Lungs: Clear to auscultation, nonlabored breathing at rest. Cardiac: Regular rate and rhythm, no S3 or significant systolic murmur. Abdomen: Soft, nontender, bowel sounds present. Extremities: No pitting edema.  ECG:  An ECG dated 06/08/2020 was personally reviewed today and demonstrated:  Atrial fibrillation with RVR, low voltage, nonspecific ST-T changes.  Recent Labwork:  November 2023: Hemoglobin 13.1, platelets 133, BUN 55, creatinine 1.57,  potassium 4.1, AST 15, ALT 13, cholesterol 148, triglycerides 125, HDL 55, LDL 71, hemoglobin A1c 7%  Other Studies Reviewed Today:  Cardiac catheterization 11/28/2019: The left ventricular systolic function is normal. LV end diastolic pressure is moderately elevated. The left ventricular ejection fraction is 55-65% by visual estimate.   1.  Normal coronary arteries. 2.  Normal LV systolic function.  Mitral annular calcifications noted. 3.  Moderately elevated left ventricular end-diastolic pressure at 24 mmHg.   Echocardiogram 09/30/2019:  1. Left ventricular ejection fraction, by estimation, is 60 to 65%. The  left ventricle has normal function. The left ventricle has no regional  wall motion abnormalities. Left ventricular diastolic parameters were  normal.   2. Right ventricular systolic function is normal. The right ventricular  size is normal.   3. Left atrial size was moderately dilated.   4. The mitral valve is normal in structure. Trivial mitral valve  regurgitation. No evidence of mitral stenosis.   5. The aortic valve is tricuspid. Aortic valve regurgitation is not  visualized. No aortic stenosis is present.   6. The inferior vena cava is dilated in size with >50% respiratory  variability, suggesting right atrial pressure of 8 mmHg.   Assessment and Plan:  1.  Paroxysmal atrial fibrillation with CHA2DS2-VASc score of 6.  Reducing Eliquis to 2.5 mg twice daily (age  and creatinine greater than 1.5).  Continue current dose of Lopressor, no obvious symptomatic bradycardia, ECG reviewed.  2.  HFpEF, LVEF 60 to 65% by last assessment.  She reports good control of fluid on Demadex with potassium supplement.  No SGLT2 inhibitor at this point.  Was not able to control fluid as well on lower dose diuretics previously, although does have some renal insufficiency which will need to be followed.  Creatinine 1.57 with normal potassium.  3.  Mixed hyperlipidemia on Lipitor.  Recent LDL 71.  Medication Adjustments/Labs and Tests Ordered: Current medicines are reviewed at length with the patient today.  Concerns regarding medicines are outlined above.   Tests Ordered: Orders Placed This Encounter  Procedures   EKG 12-Lead    Medication Changes: Meds ordered this encounter  Medications   DISCONTD: apixaban (ELIQUIS) 2.5 MG TABS tablet    Sig: Take 1 tablet (2.5 mg total) by mouth 2 (two) times daily.    Dispense:  60 tablet    Refill:  6    02/16/22 decreased to 2.5 mg bid   apixaban (ELIQUIS) 2.5 MG TABS tablet    Sig: Take 1 tablet (2.5 mg total) by mouth 2 (two) times daily.    Dispense:  180 tablet    Refill:  3    02/16/22 decreased to 2.5 mg bid    Disposition:  Follow up  6 months.  Signed, Satira Sark, MD, [facility] 02/16/2022 8:48 AM    [facility]. [address], [place], [place] [address] Phone: [phone]; Fax: [phone]

## 2022-02-16 ENCOUNTER — Ambulatory Visit: Payer: Medicare PPO | Attending: Cardiology | Admitting: Cardiology

## 2022-02-16 ENCOUNTER — Encounter: Payer: Self-pay | Admitting: Cardiology

## 2022-02-16 VITALS — BP 114/56 | HR 58 | Ht 63.5 in | Wt 263.0 lb

## 2022-02-16 DIAGNOSIS — I480 Paroxysmal atrial fibrillation: Secondary | ICD-10-CM

## 2022-02-16 DIAGNOSIS — E782 Mixed hyperlipidemia: Secondary | ICD-10-CM | POA: Diagnosis not present

## 2022-02-16 DIAGNOSIS — I5032 Chronic diastolic (congestive) heart failure: Secondary | ICD-10-CM | POA: Diagnosis not present

## 2022-02-16 MED ORDER — APIXABAN 2.5 MG PO TABS: 2.5000 mg | ORAL_TABLET | Freq: Two times a day (BID) | ORAL | 3 refills | Status: DC

## 2022-02-16 MED ORDER — APIXABAN 2.5 MG PO TABS: 2.5000 mg | ORAL_TABLET | Freq: Two times a day (BID) | ORAL | 6 refills | Status: DC

## 2022-02-16 NOTE — Patient Instructions (Addendum)
Medication Instructions:   DECREASE Eliquis to 2.5 mg twice a day  Labwork: None today  Testing/Procedures: None today  Follow-Up: 6 months  Any Other Special Instructions Will Be Listed Below (If Applicable).  If you need a refill on your cardiac medications before your next appointment, please call your pharmacy.

## 2022-02-16 NOTE — Addendum Note (Signed)
Addended by: Barbarann Ehlers A on: 02/16/2022 08:51 AM   Modules accepted: Orders

## 2022-02-21 DIAGNOSIS — R7309 Other abnormal glucose: Secondary | ICD-10-CM | POA: Diagnosis not present

## 2022-02-21 DIAGNOSIS — E1122 Type 2 diabetes mellitus with diabetic chronic kidney disease: Secondary | ICD-10-CM | POA: Diagnosis not present

## 2022-02-21 DIAGNOSIS — N1832 Chronic kidney disease, stage 3b: Secondary | ICD-10-CM | POA: Diagnosis not present

## 2022-02-21 DIAGNOSIS — D696 Thrombocytopenia, unspecified: Secondary | ICD-10-CM | POA: Diagnosis not present

## 2022-02-21 DIAGNOSIS — I5030 Unspecified diastolic (congestive) heart failure: Secondary | ICD-10-CM | POA: Diagnosis not present

## 2022-02-21 DIAGNOSIS — I480 Paroxysmal atrial fibrillation: Secondary | ICD-10-CM | POA: Diagnosis not present

## 2022-02-21 DIAGNOSIS — E785 Hyperlipidemia, unspecified: Secondary | ICD-10-CM | POA: Diagnosis not present

## 2022-02-22 ENCOUNTER — Telehealth: Payer: Self-pay | Admitting: Cardiology

## 2022-02-22 NOTE — Telephone Encounter (Signed)
Caller stated they only received pages 3-9 of a 9-page fax of the patient's notes.  Caller wants pages 1-2 re-faxed.

## 2022-02-22 NOTE — Telephone Encounter (Signed)
Progress note from last encounter on 02/16/2022 resent to PCP.

## 2022-03-23 ENCOUNTER — Other Ambulatory Visit: Payer: Self-pay | Admitting: Cardiology

## 2022-04-10 DIAGNOSIS — H02046 Spastic entropion of left eye, unspecified eyelid: Secondary | ICD-10-CM | POA: Diagnosis not present

## 2022-04-10 DIAGNOSIS — H401133 Primary open-angle glaucoma, bilateral, severe stage: Secondary | ICD-10-CM | POA: Diagnosis not present

## 2022-04-10 DIAGNOSIS — H02043 Spastic entropion of right eye, unspecified eyelid: Secondary | ICD-10-CM | POA: Diagnosis not present

## 2022-04-10 DIAGNOSIS — Z961 Presence of intraocular lens: Secondary | ICD-10-CM | POA: Diagnosis not present

## 2022-05-03 ENCOUNTER — Ambulatory Visit: Payer: Medicare PPO | Admitting: Dermatology

## 2022-05-09 DIAGNOSIS — G4733 Obstructive sleep apnea (adult) (pediatric): Secondary | ICD-10-CM | POA: Diagnosis not present

## 2022-05-16 DIAGNOSIS — F329 Major depressive disorder, single episode, unspecified: Secondary | ICD-10-CM | POA: Diagnosis not present

## 2022-05-16 DIAGNOSIS — E1129 Type 2 diabetes mellitus with other diabetic kidney complication: Secondary | ICD-10-CM | POA: Diagnosis not present

## 2022-05-16 DIAGNOSIS — I5030 Unspecified diastolic (congestive) heart failure: Secondary | ICD-10-CM | POA: Diagnosis not present

## 2022-05-16 DIAGNOSIS — N1831 Chronic kidney disease, stage 3a: Secondary | ICD-10-CM | POA: Diagnosis not present

## 2022-05-22 ENCOUNTER — Ambulatory Visit: Payer: Medicare PPO | Admitting: Pulmonary Disease

## 2022-05-23 ENCOUNTER — Ambulatory Visit (HOSPITAL_BASED_OUTPATIENT_CLINIC_OR_DEPARTMENT_OTHER): Payer: Medicare PPO | Admitting: Pulmonary Disease

## 2022-05-25 DIAGNOSIS — E1122 Type 2 diabetes mellitus with diabetic chronic kidney disease: Secondary | ICD-10-CM | POA: Diagnosis not present

## 2022-05-25 DIAGNOSIS — N1832 Chronic kidney disease, stage 3b: Secondary | ICD-10-CM | POA: Diagnosis not present

## 2022-05-25 DIAGNOSIS — I480 Paroxysmal atrial fibrillation: Secondary | ICD-10-CM | POA: Diagnosis not present

## 2022-05-25 DIAGNOSIS — I5030 Unspecified diastolic (congestive) heart failure: Secondary | ICD-10-CM | POA: Diagnosis not present

## 2022-06-05 ENCOUNTER — Encounter: Payer: Self-pay | Admitting: Pulmonary Disease

## 2022-06-05 ENCOUNTER — Ambulatory Visit: Payer: Medicare PPO | Admitting: Pulmonary Disease

## 2022-06-05 ENCOUNTER — Ambulatory Visit (HOSPITAL_COMMUNITY)
Admission: RE | Admit: 2022-06-05 | Discharge: 2022-06-05 | Disposition: A | Discharge status: Home / Self Care | Payer: Medicare PPO | Source: Ambulatory Visit | Attending: Pulmonary Disease | Admitting: Pulmonary Disease

## 2022-06-05 VITALS — BP 134/78 | HR 74 | Ht 63.5 in | Wt 269.0 lb

## 2022-06-05 DIAGNOSIS — G4733 Obstructive sleep apnea (adult) (pediatric): Secondary | ICD-10-CM

## 2022-06-05 DIAGNOSIS — R0600 Dyspnea, unspecified: Secondary | ICD-10-CM | POA: Diagnosis not present

## 2022-06-05 DIAGNOSIS — R0609 Other forms of dyspnea: Secondary | ICD-10-CM

## 2022-06-05 LAB — POCT EXHALED NITRIC OXIDE: FeNO level (ppb): 39

## 2022-06-05 MED ORDER — FLUTICASONE FUROATE-VILANTEROL 100-25 MCG/ACT IN AEPB: 1.0000 | INHALATION_SPRAY | Freq: Every day | RESPIRATORY_TRACT | 5 refills | Status: AC

## 2022-06-05 NOTE — Patient Instructions (Signed)
Breo one puff daily, and rinse your mouth after each use  Stop arnuity once you get breo  Will get a copy of your CPAP report  Chest xray today  Follow up in 3 months

## 2022-06-05 NOTE — Progress Notes (Signed)
[place] [facility], [facility], and Sleep Medicine  Chief Complaint  Patient presents with   Follow-up    Having issues with breathing and wheezing since getting sick in November     Constitutional:  BP 134/78 (BP Location: Left Arm)   Pulse 74   Ht 5' 3.5" (1.613 m)   Wt 269 lb (122 kg)   SpO2 95% Comment: RA  BMI 46.90 kg/m   Past Medical History:  Anxiety, OA, Depression, Diverticulosis, HTN, Glaucoma, Nephrolithiasis, PNA, HLD, PAF, TIA, Varicose veins  Past Surgical History:  She  has a past surgical history that includes Abdominal hysterectomy; Colonoscopy; Colonoscopy (N/A, 06/18/2013); Lithotripsy; Ablation saphenous vein w/ RFA (Bilateral); Eye surgery; Tonsillectomy; Total knee arthroplasty (Left, 08/25/2014); Mass excision (N/A, 12/07/2017); Cystoscopy with retrograde pyelogram, ureteroscopy and stent placement (Left, 10/18/2018); Holmium laser application (Left, [id number]); Parathyroidectomy (Left, 04/22/2019); LEFT HEART CATH AND CORONARY ANGIOGRAPHY (N/A, 11/28/2019); and Cataract extraction (Bilateral).  Brief Summary:  Kim Dixon is a 82 y.o. female with obstructive sleep apnea and asthma.      Subjective:   She had a respiratory infection in November 2023.  Since then she has more cough, wheeze, and chest congestion.  Gets winded more easily with activity also.  She works with clay and pottery, and has her own kiln.  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    She uses CPAP nightly.  Goes to bed at 2 am and wakes up at 10 am.  No issues with mask fit or pressure setting.  Physical Exam:   Appearance - well kempt   ENMT - no sinus tenderness, no oral exudate, no LAN, Mallampati 2 airway, no stridor  Respiratory - equal breath sounds bilaterally, no wheezing or rales  CV - s1s2 regular rate and rhythm, no murmurs  Ext - no clubbing, no edema  Skin - no rashes  Psych -  normal mood and affect     Pulmonary Tests:  PFT 07/06/21 >> FEV1 1.23 (64%), FEV1% 83, TLC 4.19 (84%), DLCO 105%, +BD FeNO 06/05/22 >> 39  Sleep Tests:  PSG 10/26/20 >> AHI 49.7, SpO2 low 77% Auto CPAP 02/17/21 to 05/17/21 >> used on 63 of 90 nights with average 4 hours.  Average AHI 4.1 with mean CPAP 9 and 95 th percentile CPAP 12 cm H2O.  Cardiac Tests:  Echo 09/30/19 >> EF 60 to 65%, mod LA dilation  Social History:  She  reports that she quit smoking about 29 years ago. Her smoking use included cigarettes. She has a 90.00 pack-year smoking history. She has never used smokeless tobacco. She reports current alcohol use. She reports that she does not use drugs.  Family History:  Her family history includes Coronary artery disease in her mother; Lung cancer in her father.     Assessment/Plan:   Allergic asthma. - has progression of symptoms with more dyspnea on exertion since she had viral respiratory infection in November 2023, and has elevated FeNO today - will change from arnuity to breo 100 one puff daily - prn albuterol - chest xray today  Allergic rhinitis with post nasal drip. - prn OTC flonase and astepro  Obstructive sleep apnea. - she is compliant with CPAP and reports benefit from therapy - she uses [employer] for her DME - current CPAP ordered September 2022 - continue auto CPAP 5 to 10 cm H2O - will get a copy of her CPAP download  Paroxysmal atrial fibrillation, Chronic diastolic CHF. - followed by Dr. Johnny Bridge with [facility]  Time Spent Involved in Patient Care on Day of Examination:  36 minutes  Follow up:   Patient Instructions  Breo one puff daily, and rinse your mouth after each use  Stop arnuity once you get breo  Will get a copy of your CPAP report  Chest xray today  Follow up in 3 months  Medication List:   Allergies as of 06/05/2022       Reactions   Naproxen Sodium    Tightness in throat        Medication List         Accurate as of June 05, 2022 10:02 AM. If you have any questions, ask your nurse or doctor.          STOP taking these medications    Arnuity Ellipta 100 MCG/ACT Aepb Generic drug: Fluticasone Furoate Stopped by: Chesley Mires, MD       TAKE these medications    albuterol 108 (90 Base) MCG/ACT inhaler Commonly known as: VENTOLIN HFA Inhale 2 puffs into the lungs every 6 (six) hours as needed for wheezing or shortness of breath.   apixaban 2.5 MG Tabs tablet Commonly known as: Eliquis Take 1 tablet (2.5 mg total) by mouth 2 (two) times daily.   atorvastatin 10 MG tablet Commonly known as: LIPITOR Take 10 mg by mouth every Monday, Wednesday, and Friday at 8 PM.   Azelastine HCl 0.15 % Soln Commonly known as: Astepro Place 1 spray into the nose daily as needed.   bimatoprost 0.01 % Soln Commonly known as: LUMIGAN Place 1 drop into both eyes at bedtime.   BIOFREEZE ROLL-ON EX Apply 1 application topically 3 (three) times daily as needed (back pain.).   citalopram 20 MG tablet Commonly known as: CELEXA Take 20 mg by mouth daily.   escitalopram 10 MG tablet Commonly known as: LEXAPRO Take 10 mg by mouth daily.   fluticasone 50 MCG/ACT nasal spray Commonly known as: FLONASE Place 1 spray into both nostrils daily as needed for allergies or rhinitis.   fluticasone furoate-vilanterol 100-25 MCG/ACT Aepb Commonly known as: Breo Ellipta Inhale 1 puff into the lungs daily. Started by: Chesley Mires, MD   levETIRAcetam 500 MG tablet Commonly known as: KEPPRA Take 250 mg by mouth 2 (two) times daily.   losartan 100 MG tablet Commonly known as: COZAAR Take 100 mg by mouth daily.   metoprolol tartrate 25 MG tablet Commonly known as: LOPRESSOR Take 25 mg by mouth 2 (two) times daily.   potassium chloride SA 20 MEQ tablet Commonly known as: KLOR-CON M TAKE ONE TABLET (20MEQ TOTAL) BY MOUTH DAILY   timolol 0.5 % ophthalmic solution Commonly known as: TIMOPTIC Place  1 drop into both eyes daily.   torsemide 20 MG tablet Commonly known as: DEMADEX TAKE THREE TABLETS BY MOUTH ONCE A DAY. What changed: See the new instructions.   VITAMIN B-12 SL Place 1 tablet under the tongue daily.   vitamin C 1000 MG tablet Take 3,000 mg by mouth daily.   vitamin D3 25 MCG (1000 UT) tablet Generic drug: Cholecalciferol Take 1,000 Units by mouth daily.        Signature:  Chesley Mires, MD [place] Pager - [phone] 06/05/2022, 10:02 AM

## 2022-06-26 ENCOUNTER — Other Ambulatory Visit (HOSPITAL_COMMUNITY): Payer: Self-pay | Admitting: Internal Medicine

## 2022-06-26 ENCOUNTER — Ambulatory Visit (HOSPITAL_COMMUNITY)
Admission: RE | Admit: 2022-06-26 | Discharge: 2022-06-26 | Disposition: A | Discharge status: Home / Self Care | Payer: Medicare PPO | Source: Ambulatory Visit | Attending: Internal Medicine | Admitting: Internal Medicine

## 2022-06-26 DIAGNOSIS — M25561 Pain in right knee: Secondary | ICD-10-CM

## 2022-06-26 DIAGNOSIS — M1711 Unilateral primary osteoarthritis, right knee: Secondary | ICD-10-CM | POA: Diagnosis not present

## 2022-06-29 DIAGNOSIS — L728 Other follicular cysts of the skin and subcutaneous tissue: Secondary | ICD-10-CM | POA: Diagnosis not present

## 2022-06-29 DIAGNOSIS — X32XXXA Exposure to sunlight, initial encounter: Secondary | ICD-10-CM | POA: Diagnosis not present

## 2022-06-29 DIAGNOSIS — L821 Other seborrheic keratosis: Secondary | ICD-10-CM | POA: Diagnosis not present

## 2022-06-29 DIAGNOSIS — D0471 Carcinoma in situ of skin of right lower limb, including hip: Secondary | ICD-10-CM | POA: Diagnosis not present

## 2022-06-29 DIAGNOSIS — L570 Actinic keratosis: Secondary | ICD-10-CM | POA: Diagnosis not present

## 2022-08-01 ENCOUNTER — Ambulatory Visit: Payer: Medicare PPO | Admitting: Cardiology

## 2022-08-08 DIAGNOSIS — H401133 Primary open-angle glaucoma, bilateral, severe stage: Secondary | ICD-10-CM | POA: Diagnosis not present

## 2022-08-08 DIAGNOSIS — H02046 Spastic entropion of left eye, unspecified eyelid: Secondary | ICD-10-CM | POA: Diagnosis not present

## 2022-08-08 DIAGNOSIS — H02043 Spastic entropion of right eye, unspecified eyelid: Secondary | ICD-10-CM | POA: Diagnosis not present

## 2022-08-08 DIAGNOSIS — Z961 Presence of intraocular lens: Secondary | ICD-10-CM | POA: Diagnosis not present

## 2022-08-10 DIAGNOSIS — Z1283 Encounter for screening for malignant neoplasm of skin: Secondary | ICD-10-CM | POA: Diagnosis not present

## 2022-08-10 DIAGNOSIS — Z08 Encounter for follow-up examination after completed treatment for malignant neoplasm: Secondary | ICD-10-CM | POA: Diagnosis not present

## 2022-08-10 DIAGNOSIS — L738 Other specified follicular disorders: Secondary | ICD-10-CM | POA: Diagnosis not present

## 2022-08-10 DIAGNOSIS — Z85828 Personal history of other malignant neoplasm of skin: Secondary | ICD-10-CM | POA: Diagnosis not present

## 2022-08-10 DIAGNOSIS — X32XXXD Exposure to sunlight, subsequent encounter: Secondary | ICD-10-CM | POA: Diagnosis not present

## 2022-08-10 DIAGNOSIS — D224 Melanocytic nevi of scalp and neck: Secondary | ICD-10-CM | POA: Diagnosis not present

## 2022-08-10 DIAGNOSIS — D225 Melanocytic nevi of trunk: Secondary | ICD-10-CM | POA: Diagnosis not present

## 2022-08-10 DIAGNOSIS — L570 Actinic keratosis: Secondary | ICD-10-CM | POA: Diagnosis not present

## 2022-08-22 DIAGNOSIS — N1832 Chronic kidney disease, stage 3b: Secondary | ICD-10-CM | POA: Diagnosis not present

## 2022-08-22 DIAGNOSIS — Z79899 Other long term (current) drug therapy: Secondary | ICD-10-CM | POA: Diagnosis not present

## 2022-08-22 DIAGNOSIS — E1129 Type 2 diabetes mellitus with other diabetic kidney complication: Secondary | ICD-10-CM | POA: Diagnosis not present

## 2022-08-22 DIAGNOSIS — I5030 Unspecified diastolic (congestive) heart failure: Secondary | ICD-10-CM | POA: Diagnosis not present

## 2022-08-22 DIAGNOSIS — I480 Paroxysmal atrial fibrillation: Secondary | ICD-10-CM | POA: Diagnosis not present

## 2022-08-25 DIAGNOSIS — M25551 Pain in right hip: Secondary | ICD-10-CM | POA: Diagnosis not present

## 2022-08-25 DIAGNOSIS — M1711 Unilateral primary osteoarthritis, right knee: Secondary | ICD-10-CM | POA: Diagnosis not present

## 2022-08-25 DIAGNOSIS — M25562 Pain in left knee: Secondary | ICD-10-CM | POA: Diagnosis not present

## 2022-08-29 DIAGNOSIS — E1122 Type 2 diabetes mellitus with diabetic chronic kidney disease: Secondary | ICD-10-CM | POA: Diagnosis not present

## 2022-08-29 DIAGNOSIS — R7309 Other abnormal glucose: Secondary | ICD-10-CM | POA: Diagnosis not present

## 2022-08-29 DIAGNOSIS — N1832 Chronic kidney disease, stage 3b: Secondary | ICD-10-CM | POA: Diagnosis not present

## 2022-08-29 DIAGNOSIS — I480 Paroxysmal atrial fibrillation: Secondary | ICD-10-CM | POA: Diagnosis not present

## 2022-09-03 DIAGNOSIS — M1711 Unilateral primary osteoarthritis, right knee: Secondary | ICD-10-CM | POA: Insufficient documentation

## 2022-10-05 NOTE — Progress Notes (Signed)
    Cardiology Office Note  Date: 10/06/2022   ID: Kim Dixon, DOB Nov 21, 1940, MRN [id number]  History of Present Illness: Kim Dixon is an 82 y.o. female last seen in November 2023. She is here for a routine visit. She does not report any palpitations or chest pain, stable NYHA class II dyspnea. States that she has had to take naps during the day, but tends to stay up late before she goes to sleep at night. No dizziness or syncope.  Reports control of fluid/edema on Demadex 40 mg daily, weight has been relatively stable. She is not on an SGLT-2 inhibitor. Reports urinary incontinence and risk of UTI likely precludes safe use.  We went over her medications and I reviewed her recent lab work.  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  Physical Exam: VS:  BP 116/62 (BP Location: Right Arm, Patient Position: Sitting, Cuff Size: Large)   Pulse 86   Ht 5' 3.5" (1.613 m)   Wt 263 lb 9.6 oz (119.6 kg)   SpO2 94%   BMI 45.96 kg/m , BMI Body mass index is 45.96 kg/m.  Wt Readings from Last 3 Encounters:  10/06/22 263 lb 9.6 oz (119.6 kg)  06/05/22 269 lb (122 kg)  02/16/22 263 lb (119.3 kg)    General: Patient appears comfortable at rest. HEENT: Conjunctiva and lids normal. Neck: Supple, no elevated JVP or carotid bruits. Lungs: Clear to auscultation, nonlabored breathing at rest. Cardiac: Irregularly irregular, no S3 or significant systolic murmur. Extremities: No pitting edema.  ECG:  An ECG dated 02/16/2022 was personally reviewed today and demonstrated:  Sinus bradycardia.  Labwork:  November 2023: Cholesterol 148, triglycerides 125, HDL 55, LDL 71, hemoglobin 13.1, platelets 133 June 2024: BUN 20, creatinine 1.4, potassium 4, hemoglobin A1c 7.1%  Other Studies Reviewed Today:  No interval cardiac testing for review today.  Assessment and Plan:  1.  Paroxysmal atrial fibrillation with CHA2DS2-VASc score of 6. She is in rate controlled  atrial fibrillation today and asymptomatic. Plan to continue Eliquis for stroke prophylaxis and Lopressor for rate control.  2.  HFpEF, LVEF 60 to 65% by echocardiogram in July 2021. Stable weight and fluid status. Continue Demadex with potassium supplement. Not good candidate for SGLT-2 inhibitor due to problems with urinary incontinence and increased risk of UTI.  3.  Mixed hyperlipidemia, LDL 71 in November 2023. Continue Lipitor.  4.  Essential hypertension. Blood pressure well controlled today.  Disposition:  Follow up  6 months.  Signed, Jonelle Sidle, M.D., F.A.C.C. [facility] at [facility]

## 2022-10-06 ENCOUNTER — Encounter: Payer: Self-pay | Admitting: Cardiology

## 2022-10-06 ENCOUNTER — Ambulatory Visit: Payer: Medicare PPO | Attending: Cardiology | Admitting: Cardiology

## 2022-10-06 VITALS — BP 116/62 | HR 86 | Ht 63.5 in | Wt 263.6 lb

## 2022-10-06 DIAGNOSIS — E782 Mixed hyperlipidemia: Secondary | ICD-10-CM | POA: Diagnosis not present

## 2022-10-06 DIAGNOSIS — I10 Essential (primary) hypertension: Secondary | ICD-10-CM | POA: Diagnosis not present

## 2022-10-06 DIAGNOSIS — I4819 Other persistent atrial fibrillation: Secondary | ICD-10-CM

## 2022-10-06 DIAGNOSIS — I5032 Chronic diastolic (congestive) heart failure: Secondary | ICD-10-CM | POA: Diagnosis not present

## 2022-10-06 DIAGNOSIS — I480 Paroxysmal atrial fibrillation: Secondary | ICD-10-CM | POA: Diagnosis not present

## 2022-10-06 NOTE — Patient Instructions (Signed)
Medication Instructions:  Your physician recommends that you continue on your current medications as directed. Please refer to the Current Medication list given to you today.   Labwork: None today  Testing/Procedures: None today  Follow-Up: 6 months  Any Other Special Instructions Will Be Listed Below (If Applicable).  If you need a refill on your cardiac medications before your next appointment, please call your pharmacy.  

## 2022-10-30 ENCOUNTER — Encounter: Payer: Self-pay | Admitting: Pulmonary Disease

## 2022-10-30 ENCOUNTER — Ambulatory Visit: Payer: Medicare PPO | Admitting: Pulmonary Disease

## 2022-10-30 VITALS — BP 110/73 | HR 107 | Ht 63.5 in | Wt 266.0 lb

## 2022-10-30 DIAGNOSIS — G4730 Sleep apnea, unspecified: Secondary | ICD-10-CM | POA: Diagnosis not present

## 2022-10-30 DIAGNOSIS — G4733 Obstructive sleep apnea (adult) (pediatric): Secondary | ICD-10-CM | POA: Diagnosis not present

## 2022-10-30 DIAGNOSIS — J4540 Moderate persistent asthma, uncomplicated: Secondary | ICD-10-CM

## 2022-10-30 DIAGNOSIS — E669 Obesity, unspecified: Secondary | ICD-10-CM | POA: Diagnosis not present

## 2022-10-30 HISTORY — DX: Obstructive sleep apnea (adult) (pediatric): G47.33

## 2022-10-30 NOTE — Progress Notes (Signed)
Pulmonary, Critical Care, and Sleep Medicine  Chief Complaint  Patient presents with   Shortness of Breath   Sleep Apnea    Constitutional:  BP 110/73   Pulse (!) 107   Ht 5' 3.5" (1.613 m)   Wt 266 lb (120.7 kg)   SpO2 92%   BMI 46.38 kg/m   Past Medical History:  Anxiety, OA, Depression, Diverticulosis, HTN, Glaucoma, Nephrolithiasis, PNA, HLD, PAF, TIA, Varicose veins  Past Surgical History:  She  has a past surgical history that includes Abdominal hysterectomy; Colonoscopy; Colonoscopy (N/A, 06/18/2013); Lithotripsy; Ablation saphenous vein w/ RFA (Bilateral); Eye surgery; Tonsillectomy; Total knee arthroplasty (Left, 08/25/2014); Mass excision (N/A, 12/07/2017); Cystoscopy with retrograde pyelogram, ureteroscopy and stent placement (Left, 10/18/2018); Holmium laser application (Left, 10/18/2018); Parathyroidectomy (Left, 04/22/2019); LEFT HEART CATH AND CORONARY ANGIOGRAPHY (N/A, 11/28/2019); and Cataract extraction (Bilateral).  Brief Summary:  Kim Dixon is a 82 y.o. female with obstructive sleep apnea and asthma.      Subjective:   She hasn't been using her CPAP.  She wasn't sure if it was helping her sleep.  She is back in A fib.  She feels sleepy during the day and has to nap for about 1.5 hours a day.  She has intermittent cough and congestion.  Breo helps.  Not needing albuterol much.  Felt better when she went to [place].  Gets her house cleaned regularly.  Only has carpet in her living room.  Doesn't notice mold smell at her house.  Physical Exam:   Appearance - well kempt   ENMT - no sinus tenderness, no oral exudate, no LAN, Mallampati 2 airway, no stridor  Respiratory - equal breath sounds bilaterally, no wheezing or rales  CV - irregular  Ext - no clubbing, no edema  Skin - no rashes  Psych - normal mood and affect     Pulmonary Tests:  PFT 07/06/21 >> FEV1 1.23 (64%), FEV1% 83, TLC 4.19 (84%), DLCO 105%, +BD FeNO 06/05/22  >> 39  Sleep Tests:  PSG 10/26/20 >> AHI 49.7, SpO2 low 77% Auto CPAP 05/09/22 to 06/07/22 >> used on 29 of 30 nights with average 4 hrs 41 min.  Average AHI 2.5 with median CPAP 9 and 95 th percentile CPAP 10 cm H2O  Cardiac Tests:  Echo 09/30/19 >> EF 60 to 65%, mod LA dilation  Social History:  She  reports that she quit smoking about 29 years ago. Her smoking use included cigarettes. She started smoking about 59 years ago. She has a 90 pack-year smoking history. She has never used smokeless tobacco. She reports current alcohol use. She reports that she does not use drugs.  Family History:  Her family history includes Coronary artery disease in her mother; Lung cancer in her father.     Assessment/Plan:   Allergic asthma. - continue breo 100 one puff daily - prn albuterol  Allergic rhinitis with post nasal drip. - prn OTC flonase and astepro - advised her to try getting dust mite covers for her pillow and mattress  Obstructive sleep apnea. - she is compliant with CPAP and reports benefit from therapy - she uses [employer] for her DME - current CPAP ordered September 2022 - continue auto CPAP 5 to 10 cm H2O - discussed how untreated sleep apnea can impact her heart function  Paroxysmal atrial fibrillation, Chronic diastolic CHF. - followed by Dr. Simona Huh with [facility]  Time Spent Involved in Patient Care on Day of Examination:  37  minutes  Follow up:   There are no Patient Instructions on file for this visit.  Medication List:   Allergies as of 10/30/2022       Reactions   Naproxen Sodium    Tightness in throat        Medication List        Accurate as of October 30, 2022 10:01 AM. If you have any questions, ask your nurse or doctor.          albuterol 108 (90 Base) MCG/ACT inhaler Commonly known as: VENTOLIN HFA Inhale 2 puffs into the lungs every 6 (six) hours as needed for wheezing or shortness of breath.   apixaban 2.5 MG Tabs  tablet Commonly known as: Eliquis Take 1 tablet (2.5 mg total) by mouth 2 (two) times daily.   atorvastatin 10 MG tablet Commonly known as: LIPITOR Take 10 mg by mouth every Monday, Wednesday, and Friday at 8 PM.   bimatoprost 0.01 % Soln Commonly known as: LUMIGAN Place 1 drop into both eyes at bedtime.   BIOFREEZE ROLL-ON EX Apply 1 application topically 3 (three) times daily as needed (back pain.).   escitalopram 10 MG tablet Commonly known as: LEXAPRO Take 10 mg by mouth daily.   fluticasone furoate-vilanterol 100-25 MCG/ACT Aepb Commonly known as: Breo Ellipta Inhale 1 puff into the lungs daily. What changed: additional instructions   levETIRAcetam 500 MG tablet Commonly known as: KEPPRA Take 250 mg by mouth 2 (two) times daily.   losartan 100 MG tablet Commonly known as: COZAAR Take 100 mg by mouth daily.   metoprolol tartrate 25 MG tablet Commonly known as: LOPRESSOR Take 25 mg by mouth 2 (two) times daily.   potassium chloride SA 20 MEQ tablet Commonly known as: KLOR-CON M TAKE ONE TABLET ( TOTAL) BY MOUTH DAILY   timolol 0.5 % ophthalmic solution Commonly known as: TIMOPTIC Place 1 drop into both eyes daily.   torsemide 20 MG tablet Commonly known as: DEMADEX Take 40 mg by mouth daily.   VITAMIN B-12 SL Place 1 tablet under the tongue daily.   vitamin C 1000 MG tablet Take 3,000 mg by mouth daily.   vitamin D3 25 MCG tablet Commonly known as: CHOLECALCIFEROL Take 1,000 Units by mouth daily.        Signature:  Coralyn Helling, MD [facility] Pulmonary/Critical Care Pager - [phone] 10/30/2022, 10:01 AM

## 2022-10-30 NOTE — Patient Instructions (Signed)
Follow up in 6 months 

## 2022-11-23 DIAGNOSIS — E1129 Type 2 diabetes mellitus with other diabetic kidney complication: Secondary | ICD-10-CM | POA: Diagnosis not present

## 2022-11-23 DIAGNOSIS — N1832 Chronic kidney disease, stage 3b: Secondary | ICD-10-CM | POA: Diagnosis not present

## 2022-11-23 DIAGNOSIS — I480 Paroxysmal atrial fibrillation: Secondary | ICD-10-CM | POA: Diagnosis not present

## 2022-11-23 DIAGNOSIS — I5030 Unspecified diastolic (congestive) heart failure: Secondary | ICD-10-CM | POA: Diagnosis not present

## 2022-11-23 DIAGNOSIS — Z79899 Other long term (current) drug therapy: Secondary | ICD-10-CM | POA: Diagnosis not present

## 2022-11-30 DIAGNOSIS — E1122 Type 2 diabetes mellitus with diabetic chronic kidney disease: Secondary | ICD-10-CM | POA: Diagnosis not present

## 2022-11-30 DIAGNOSIS — E1129 Type 2 diabetes mellitus with other diabetic kidney complication: Secondary | ICD-10-CM | POA: Diagnosis not present

## 2022-11-30 DIAGNOSIS — E785 Hyperlipidemia, unspecified: Secondary | ICD-10-CM | POA: Diagnosis not present

## 2022-11-30 DIAGNOSIS — I5030 Unspecified diastolic (congestive) heart failure: Secondary | ICD-10-CM | POA: Diagnosis not present

## 2022-11-30 DIAGNOSIS — N1832 Chronic kidney disease, stage 3b: Secondary | ICD-10-CM | POA: Diagnosis not present

## 2022-11-30 DIAGNOSIS — I480 Paroxysmal atrial fibrillation: Secondary | ICD-10-CM | POA: Diagnosis not present

## 2022-12-15 ENCOUNTER — Other Ambulatory Visit: Payer: Self-pay | Admitting: Cardiology

## 2022-12-20 DIAGNOSIS — H401133 Primary open-angle glaucoma, bilateral, severe stage: Secondary | ICD-10-CM | POA: Diagnosis not present

## 2022-12-20 DIAGNOSIS — H02043 Spastic entropion of right eye, unspecified eyelid: Secondary | ICD-10-CM | POA: Diagnosis not present

## 2022-12-20 DIAGNOSIS — Z961 Presence of intraocular lens: Secondary | ICD-10-CM | POA: Diagnosis not present

## 2022-12-20 DIAGNOSIS — H02046 Spastic entropion of left eye, unspecified eyelid: Secondary | ICD-10-CM | POA: Diagnosis not present

## 2022-12-20 DIAGNOSIS — H26492 Other secondary cataract, left eye: Secondary | ICD-10-CM | POA: Diagnosis not present

## 2023-02-20 DIAGNOSIS — N200 Calculus of kidney: Secondary | ICD-10-CM | POA: Diagnosis not present

## 2023-02-20 DIAGNOSIS — R3915 Urgency of urination: Secondary | ICD-10-CM | POA: Diagnosis not present

## 2023-02-26 ENCOUNTER — Encounter: Payer: Self-pay | Admitting: Cardiology

## 2023-02-26 ENCOUNTER — Ambulatory Visit: Payer: Medicare PPO | Attending: Cardiology | Admitting: Cardiology

## 2023-02-26 VITALS — BP 110/78 | HR 142 | Ht 63.5 in | Wt 267.0 lb

## 2023-02-26 DIAGNOSIS — I4819 Other persistent atrial fibrillation: Secondary | ICD-10-CM

## 2023-02-26 DIAGNOSIS — I480 Paroxysmal atrial fibrillation: Secondary | ICD-10-CM | POA: Diagnosis not present

## 2023-02-26 DIAGNOSIS — I10 Essential (primary) hypertension: Secondary | ICD-10-CM | POA: Diagnosis not present

## 2023-02-26 DIAGNOSIS — I5032 Chronic diastolic (congestive) heart failure: Secondary | ICD-10-CM | POA: Diagnosis not present

## 2023-02-26 DIAGNOSIS — E782 Mixed hyperlipidemia: Secondary | ICD-10-CM | POA: Diagnosis not present

## 2023-02-26 MED ORDER — METOPROLOL TARTRATE 50 MG PO TABS: 50.0000 mg | ORAL_TABLET | Freq: Two times a day (BID) | ORAL | 3 refills | Status: DC

## 2023-02-26 MED ORDER — LOSARTAN POTASSIUM 50 MG PO TABS: 50.0000 mg | ORAL_TABLET | Freq: Every day | ORAL | 3 refills | Status: DC

## 2023-02-26 MED ORDER — APIXABAN 5 MG PO TABS
5.0000 mg | ORAL_TABLET | Freq: Two times a day (BID) | ORAL | 11 refills | Status: DC
Start: 2023-02-26 — End: 2023-03-16

## 2023-02-26 NOTE — Patient Instructions (Signed)
Medication Instructions:   DECREASE Losartan to 50 mg daily   INCREASE Lopressor to 50 mg twice a day   INCREASE Eliquis to 5 mg twice a day    Labwork: None today  Testing/Procedures: None today  Follow-Up: 1 week nurse appointment for HR check  1 months with Dr.McDowell or APP  Any Other Special Instructions Will Be Listed Below (If Applicable).  If you need a refill on your cardiac medications before your next appointment, please call your pharmacy.

## 2023-02-26 NOTE — Progress Notes (Signed)
    Cardiology Office Note  Date: 02/26/2023   ID: Kim Dixon, DOB 10-10-1940, MRN [id number]  History of Present Illness: Kim Dixon is an 82 y.o. female last seen in July.  She is here for a follow-up visit.  States that she had COVID back in September, also recent URI with congestion.  She has been more short of breath with activity, not necessarily retaining any fluid and her weight is stable.  She is noted to be in rapid atrial fibrillation today.  She does not feel any sense of palpitations.  I reviewed her medications.  She continues on Eliquis, Lipitor, Cozaar, Lopressor, and Demadex with potassium supplement.  She reports compliance with treatment.  ECG today shows atrial fibrillation at 142 bpm with low voltage and nonspecific ST changes.  Physical Exam: VS:  BP 110/78   Pulse (!) 142   Ht 5' 3.5" (1.613 m)   Wt 267 lb (121.1 kg)   BMI 46.56 kg/m , BMI Body mass index is 46.56 kg/m.  Wt Readings from Last 3 Encounters:  02/26/23 267 lb (121.1 kg)  10/30/22 266 lb (120.7 kg)  10/06/22 263 lb 9.6 oz (119.6 kg)    General: Patient appears comfortable at rest. HEENT: Conjunctiva and lids normal. Neck: Supple, no elevated JVP or carotid bruits. Lungs: No wheezing or rhonchi. Cardiac: Irregularly irregular, no significant murmur or gallop. Extremities: No pitting edema.  ECG:  An ECG dated 02/16/2022 was personally reviewed today and demonstrated:  Sinus bradycardia.  Labwork:  September 2024: Hemoglobin 13.2, platelets 163, BUN 22, creatinine 1.37, GFR 39, potassium 4.1, AST 18, ALT 17, cholesterol 183, triglycerides 102, HDL 83, LDL 82  Other Studies Reviewed Today:  No interval cardiac testing for review today.  Assessment and Plan:  1.  Persistent atrial fibrillation with CHA2DS2-VASc score of 6.  Heart rate not well-controlled today, she reports compliance with her medications.  Does report diagnosis of COVID in September and interval URI.  Plan  is to increase Lopressor to 50 mg twice daily, Eliquis needs to be changed to 5 mg twice daily based on her most recent lab work.  Depending on how she does and heart rate control in general, we will bring her back for further discussion regarding management as permanent atrial fibrillation versus an attempt at cardioversion.   2.  HFpEF, LVEF 60 to 65% by echocardiogram in July 2021. Stable weight and fluid status. Continue Demadex with potassium supplement. Not good candidate for SGLT-2 inhibitor due to problems with urinary incontinence and increased risk of UTI.  Weight has been stable, no leg swelling.   3.  Mixed hyperlipidemia, LDL 82 in September of this year..   4.  Primary hypertension.  Reduce Cozaar to 50 mg daily given above medication changes.  Disposition:  Follow up  nurse visit in 1 week, routine office visit in 1 month.  Signed, Jonelle Sidle, M.D., F.A.C.C. [place] [facility] at [facility]

## 2023-03-05 ENCOUNTER — Ambulatory Visit: Payer: Medicare PPO | Attending: Internal Medicine

## 2023-03-06 ENCOUNTER — Other Ambulatory Visit: Payer: Self-pay | Admitting: Cardiology

## 2023-03-06 DIAGNOSIS — I480 Paroxysmal atrial fibrillation: Secondary | ICD-10-CM | POA: Diagnosis not present

## 2023-03-06 DIAGNOSIS — Z79899 Other long term (current) drug therapy: Secondary | ICD-10-CM | POA: Diagnosis not present

## 2023-03-06 DIAGNOSIS — I5030 Unspecified diastolic (congestive) heart failure: Secondary | ICD-10-CM | POA: Diagnosis not present

## 2023-03-06 DIAGNOSIS — E1129 Type 2 diabetes mellitus with other diabetic kidney complication: Secondary | ICD-10-CM | POA: Diagnosis not present

## 2023-03-06 DIAGNOSIS — E785 Hyperlipidemia, unspecified: Secondary | ICD-10-CM | POA: Diagnosis not present

## 2023-03-06 DIAGNOSIS — N1832 Chronic kidney disease, stage 3b: Secondary | ICD-10-CM | POA: Diagnosis not present

## 2023-03-09 DIAGNOSIS — E1122 Type 2 diabetes mellitus with diabetic chronic kidney disease: Secondary | ICD-10-CM | POA: Diagnosis not present

## 2023-03-09 DIAGNOSIS — N1832 Chronic kidney disease, stage 3b: Secondary | ICD-10-CM | POA: Diagnosis not present

## 2023-03-09 DIAGNOSIS — I480 Paroxysmal atrial fibrillation: Secondary | ICD-10-CM | POA: Diagnosis not present

## 2023-03-16 ENCOUNTER — Other Ambulatory Visit: Payer: Self-pay

## 2023-03-16 DIAGNOSIS — I480 Paroxysmal atrial fibrillation: Secondary | ICD-10-CM

## 2023-03-16 MED ORDER — APIXABAN 2.5 MG PO TABS: 2.5000 mg | ORAL_TABLET | Freq: Two times a day (BID) | ORAL | 1 refills | Status: DC

## 2023-03-16 NOTE — Telephone Encounter (Signed)
Prescription refill request for Eliquis received. Indication: Afib  Last office visit: 02/26/23 Diona Browner)  Scr: 1.67 (03/06/23)  Age: 82 Weight: 121.1kg  Per Dr Diona Browner, dose should be decreased to 2.5mg  BID. Called pt and made her aware. Refill sent.

## 2023-04-03 ENCOUNTER — Encounter: Payer: Self-pay | Admitting: Student

## 2023-04-03 ENCOUNTER — Ambulatory Visit: Payer: Medicare PPO | Attending: Student | Admitting: Student

## 2023-04-03 VITALS — BP 104/70 | HR 111 | Wt 265.0 lb

## 2023-04-03 DIAGNOSIS — I130 Hypertensive heart and chronic kidney disease with heart failure and stage 1 through stage 4 chronic kidney disease, or unspecified chronic kidney disease: Secondary | ICD-10-CM | POA: Diagnosis not present

## 2023-04-03 DIAGNOSIS — I10 Essential (primary) hypertension: Secondary | ICD-10-CM | POA: Diagnosis not present

## 2023-04-03 DIAGNOSIS — I5032 Chronic diastolic (congestive) heart failure: Secondary | ICD-10-CM | POA: Diagnosis not present

## 2023-04-03 DIAGNOSIS — I480 Paroxysmal atrial fibrillation: Secondary | ICD-10-CM

## 2023-04-03 DIAGNOSIS — Z01818 Encounter for other preprocedural examination: Secondary | ICD-10-CM | POA: Diagnosis not present

## 2023-04-03 DIAGNOSIS — N1832 Chronic kidney disease, stage 3b: Secondary | ICD-10-CM

## 2023-04-03 DIAGNOSIS — I4891 Unspecified atrial fibrillation: Secondary | ICD-10-CM | POA: Insufficient documentation

## 2023-04-03 MED ORDER — LOSARTAN POTASSIUM 25 MG PO TABS: 25.0000 mg | ORAL_TABLET | Freq: Every day | ORAL | 3 refills | Status: DC

## 2023-04-03 NOTE — Progress Notes (Signed)
 Cardiology Office Note    Date:  04/03/2023  ID:  KATEE WENTLAND, DOB [birth year], MRN [id number] Cardiologist: Jayson Sierras, MD    History of Present Illness:    Kim Dixon is a 83 y.o. female with past medical history of paroxysmal atrial fibrillation, chronic HFpEF, HTN, HLD and normal coronary arteries by cardiac catheterization in 11/2019 who presents to the office today for 1 month follow-up.  She was examined by Dr. Sierras in 02/2023 and did report worsening shortness of breath with activity over the past few months. She was found to be in atrial fibrillation with RVR with heart rate of 142 bpm and Lopressor  was increased to 50 mg twice daily. Eliquis  was also adjusted to 5 mg twice daily based off her most recent labs. It was recommended that depending on how she felt and how HR responded, would either treat as permanent atrial fibrillation vs. attempt a cardioversion. She was continued on Torsemide  40 mg daily and was not on an SGLT2 inhibitor given issues with urinary incontinence and increased risk of a UTI. She did have a nurse visit for follow-up EKG on 03/05/2023 but no showed for this.  In the interim, Eliquis  was reduced back to 2.5 mg twice daily given repeat labs.  In talking with the patient today, she does report having more frequent fatigue over the past several months and is unsure if this is due to her arrhythmia or a different etiology. She denies any specific chest pain or palpitations. Does have intermittent dyspnea but this is also in the setting of known asthma and she is unsure what the culprit could be. She denies any specific orthopnea, PND or pitting edema. She did have a mechanical fall out of her bed within the past few weeks and reports injuring her left shoulder at that time and has follow-up with orthopedics tomorrow. She does not check her vitals routinely at home. BP is soft at 104/70 during today's visit but she denies any associated symptoms. She  reports good compliance with Eliquis  and denies missing any recent doses.  Studies Reviewed:   EKG: EKG is ordered today and demonstrates:   EKG Interpretation Date/Time:  Tuesday April 03 2023 15:37:09 EST Ventricular Rate:  111 PR Interval:    QRS Duration:  86 QT Interval:  276 QTC Calculation: 375 R Axis:   143  Text Interpretation: Atrial flutter with variable A-V block Right axis deviation Possible Right ventricular hypertrophy Confirmed by Johnson Grate ([id number]) on 04/03/2023 3:44:36 PM       Echocardiogram: 09/2019 IMPRESSIONS     1. Left ventricular ejection fraction, by estimation, is 60 to 65%. The  left ventricle has normal function. The left ventricle has no regional  wall motion abnormalities. Left ventricular diastolic parameters were  normal.   2. Right ventricular systolic function is normal. The right ventricular  size is normal.   3. Left atrial size was moderately dilated.   4. The mitral valve is normal in structure. Trivial mitral valve  regurgitation. No evidence of mitral stenosis.   5. The aortic valve is tricuspid. Aortic valve regurgitation is not  visualized. No aortic stenosis is present.   6. The inferior vena cava is dilated in size with >50% respiratory  variability, suggesting right atrial pressure of 8 mmHg.    LHC: 11/2019 The left ventricular systolic function is normal. LV end diastolic pressure is moderately elevated. The left ventricular ejection fraction is 55-65% by visual estimate.   1.  Normal coronary arteries. 2.  Normal LV systolic function.  Mitral annular calcifications noted. 3.  Moderately elevated left ventricular end-diastolic pressure at 24 mmHg.   Recommendations: The patient symptoms could be related to diastolic heart failure.  LVEDP was moderately elevated but the patient received aggressive precath hydration which might have contributed.  Continue torsemide  but consider adjusting the dose as needed.  Risk  Assessment/Calculations:    CHA2DS2-VASc Score = 4   This indicates a 4.8% annual risk of stroke. The patient's score is based upon: CHF History: 0 HTN History: 1 Diabetes History: 0 Stroke History: 0 Vascular Disease History: 0 Age Score: 2 Gender Score: 1    Physical Exam:   VS:  BP 104/70 (BP Location: Right Arm, Cuff Size: Large)   Pulse (!) 111   Wt 265 lb (120.2 kg)   SpO2 95%   BMI 46.21 kg/m    Wt Readings from Last 3 Encounters:  04/03/23 265 lb (120.2 kg)  02/26/23 267 lb (121.1 kg)  10/30/22 266 lb (120.7 kg)     GEN: Well nourished, well developed female appearing in no acute distress NECK: No JVD; No carotid bruits CARDIAC: Irregular irregular.  No friction rub. RESPIRATORY:  Clear to auscultation without rales, wheezing or rhonchi  ABDOMEN: Appears non-distended. No obvious abdominal masses. EXTREMITIES: No clubbing or cyanosis. No pitting edema.  Distal pedal pulses are 2+ bilaterally.   Assessment and Plan:   1. Persistent Atrial Fibrillation and Flutter/Use of Long-term Anticoagulation - She has a history of persistent atrial fibrillation and EKG today is most consistent with flutter but rates remain elevated in the 110's. As discussed at the time of her office visit, we reviewed options in regards to rate control vs. rhythm control and she wishes to proceed with a DCCV to see if she feels better once in a normal sinus rhythm. If she does experience improvement and has recurrence, would recommend referral to EP for consideration of antiarrhythmic options. Will continue Lopressor  50 mg twice daily for now. Given her current BP, I am unable to further titrate. If her DCCV is able to be performed within the next 1 to 2 weeks, would continue current medical therapy for now. If this is pushed out further, will bring in for a nurse visit for repeat EKG and reassessment of vitals as we can further titrate Lopressor  pending improvement in BP. - No reports of active  bleeding. Remains on Eliquis  2.5 mg twice daily for anticoagulation which is the appropriate dose given her age, weight and renal function. She denies missing any recent doses. She is meeting with orthopedics tomorrow in regards to her shoulder and while she is not anticipating any surgical interventions at this time, we reviewed that surgery would be delayed for at least 4 weeks following DCCV.  Addendum: Made aware by clinical staff that her DCCV can be performed on Friday, 04/06/2023. Orders entered and pre-procedure labs pending.   2. Chronic HFpEF - She has baseline dyspnea as discussed above but denies any lower extremity edema and appears euvolemic by examination today. Will continue Torsemide  on 60 mg daily. She has not been on an SGLT2 inhibitor given issues with urinary incontinence and concerns of her increased risk of a UTI. Can obtain a follow-up echocardiogram for reassessment of her EF once rates improve.  3. HTN - Blood pressure is at 104/70 during today's visit. Will reduce Losartan  from 50 mg daily to 25 mg daily.  Continue Lopressor  50 mg twice daily for  now.  4. Stage 3 CKD - Creatinine was at 1.67 when checked in 02/2023. Will recheck a BMET with pre-procedure labs.   Informed Consent   Shared Decision Making/Informed Consent The risks (stroke, cardiac arrhythmias rarely resulting in the need for a temporary or permanent pacemaker, skin irritation or burns and complications associated with conscious sedation including aspiration, arrhythmia, respiratory failure and death), benefits (restoration of normal sinus rhythm) and alternatives of a direct current cardioversion were explained in detail to Ms. Aycock and she agrees to proceed.       Signed, Laymon CHRISTELLA Qua, PA-C

## 2023-04-03 NOTE — Patient Instructions (Addendum)
 Medication Instructions:   Reduce Losartan  to 25mg  once daily.  Labwork:  CBC and BMET at pre-procedure appointment.   Testing/Procedures:  Will arrange for Cardioversion within the next 2-3 weeks.  Follow-Up:  In 6-8 weeks with Laymon Qua, PA or Dr. Debera   If you need a refill on your cardiac medications before your next appointment, please call your pharmacy.    Your physician has recommended that you have a Cardioversion (DCCV). Electrical Cardioversion uses a jolt of electricity to your heart either through paddles or wired patches attached to your chest. This is a controlled, usually prescheduled, procedure. Defibrillation is done under light anesthesia in the hospital, and you usually go home the day of the procedure. This is done to get your heart back into a normal rhythm. You are not awake for the procedure. Please see the instruction sheet given to you today.

## 2023-04-04 DIAGNOSIS — M25511 Pain in right shoulder: Secondary | ICD-10-CM | POA: Diagnosis not present

## 2023-04-04 DIAGNOSIS — M25512 Pain in left shoulder: Secondary | ICD-10-CM | POA: Diagnosis not present

## 2023-04-05 ENCOUNTER — Encounter (HOSPITAL_COMMUNITY)
Admission: RE | Admit: 2023-04-05 | Discharge: 2023-04-05 | Disposition: A | Discharge status: Home / Self Care | Payer: Medicare PPO | Source: Ambulatory Visit | Attending: Cardiology | Admitting: Cardiology

## 2023-04-05 VITALS — BP 104/70 | HR 111 | Temp 97.7°F | Resp 18 | Ht 63.5 in | Wt 265.0 lb

## 2023-04-05 DIAGNOSIS — Z01818 Encounter for other preprocedural examination: Secondary | ICD-10-CM

## 2023-04-05 DIAGNOSIS — Z01812 Encounter for preprocedural laboratory examination: Secondary | ICD-10-CM | POA: Diagnosis not present

## 2023-04-05 LAB — CBC WITH DIFFERENTIAL/PLATELET
Abs Immature Granulocytes: 0.02 10*3/uL (ref 0.00–0.07)
Basophils Absolute: 0 10*3/uL (ref 0.0–0.1)
Basophils Relative: 1 %
Eosinophils Absolute: 0.2 10*3/uL (ref 0.0–0.5)
Eosinophils Relative: 3 %
HCT: 41.8 % (ref 36.0–46.0)
Hemoglobin: 13.6 g/dL (ref 12.0–15.0)
Immature Granulocytes: 0 %
Lymphocytes Relative: 23 %
Lymphs Abs: 1.3 10*3/uL (ref 0.7–4.0)
MCH: 32.2 pg (ref 26.0–34.0)
MCHC: 32.5 g/dL (ref 30.0–36.0)
MCV: 98.8 fL (ref 80.0–100.0)
Monocytes Absolute: 0.5 10*3/uL (ref 0.1–1.0)
Monocytes Relative: 9 %
Neutro Abs: 3.6 10*3/uL (ref 1.7–7.7)
Neutrophils Relative %: 64 %
Platelets: 181 10*3/uL (ref 150–400)
RBC: 4.23 MIL/uL (ref 3.87–5.11)
RDW: 14.3 % (ref 11.5–15.5)
WBC: 5.6 10*3/uL (ref 4.0–10.5)
nRBC: 0 % (ref 0.0–0.2)

## 2023-04-05 LAB — BASIC METABOLIC PANEL
Anion gap: 10 (ref 5–15)
BUN: 19 mg/dL (ref 8–23)
CO2: 29 mmol/L (ref 22–32)
Calcium: 8.7 mg/dL — ABNORMAL LOW (ref 8.9–10.3)
Chloride: 99 mmol/L (ref 98–111)
Creatinine, Ser: 1.09 mg/dL — ABNORMAL HIGH (ref 0.44–1.00)
GFR, Estimated: 51 mL/min — ABNORMAL LOW (ref >60)
Glucose, Bld: 146 mg/dL — ABNORMAL HIGH (ref 70–99)
Potassium: 4 mmol/L (ref 3.5–5.1)
Sodium: 138 mmol/L (ref 135–145)

## 2023-04-06 ENCOUNTER — Encounter (HOSPITAL_COMMUNITY)
Admission: RE | Disposition: A | Discharge status: Home / Self Care | Payer: Self-pay | Source: Ambulatory Visit | Attending: Cardiology

## 2023-04-06 ENCOUNTER — Ambulatory Visit (HOSPITAL_COMMUNITY)
Admission: RE | Admit: 2023-04-06 | Discharge: 2023-04-06 | Disposition: A | Discharge status: Home / Self Care | Payer: Medicare PPO | Source: Ambulatory Visit | Attending: Cardiology | Admitting: Cardiology

## 2023-04-06 ENCOUNTER — Ambulatory Visit (HOSPITAL_COMMUNITY): Payer: Medicare PPO | Admitting: Anesthesiology

## 2023-04-06 ENCOUNTER — Encounter (HOSPITAL_COMMUNITY): Payer: Self-pay | Admitting: Cardiology

## 2023-04-06 DIAGNOSIS — G4730 Sleep apnea, unspecified: Secondary | ICD-10-CM | POA: Diagnosis not present

## 2023-04-06 DIAGNOSIS — Z538 Procedure and treatment not carried out for other reasons: Secondary | ICD-10-CM | POA: Diagnosis not present

## 2023-04-06 DIAGNOSIS — Z6841 Body Mass Index (BMI) 40.0 and over, adult: Secondary | ICD-10-CM | POA: Insufficient documentation

## 2023-04-06 DIAGNOSIS — I4892 Unspecified atrial flutter: Secondary | ICD-10-CM | POA: Insufficient documentation

## 2023-04-06 DIAGNOSIS — E66813 Obesity, class 3: Secondary | ICD-10-CM | POA: Insufficient documentation

## 2023-04-06 DIAGNOSIS — Z87891 Personal history of nicotine dependence: Secondary | ICD-10-CM | POA: Diagnosis not present

## 2023-04-06 DIAGNOSIS — I4819 Other persistent atrial fibrillation: Secondary | ICD-10-CM

## 2023-04-06 DIAGNOSIS — Z8673 Personal history of transient ischemic attack (TIA), and cerebral infarction without residual deficits: Secondary | ICD-10-CM | POA: Insufficient documentation

## 2023-04-06 DIAGNOSIS — I4891 Unspecified atrial fibrillation: Secondary | ICD-10-CM | POA: Insufficient documentation

## 2023-04-06 SURGERY — CARDIOVERSION
Anesthesia: Monitor Anesthesia Care

## 2023-04-06 NOTE — Progress Notes (Signed)
Pt in NSR dr. Wyline Mood cancelled case

## 2023-04-06 NOTE — Anesthesia Preprocedure Evaluation (Signed)
Anesthesia Evaluation  Patient identified by MRN, date of birth, ID band Patient awake    Reviewed: Allergy & Precautions, NPO status , Patient's Chart, lab work & pertinent test results  History of Anesthesia Complications Negative for: history of anesthetic complications  Airway Mallampati: II  TM Distance: >3 FB Neck ROM: Full    Dental no notable dental hx. (+) Teeth Intact, Dental Advisory Given   Pulmonary shortness of breath, sleep apnea , pneumonia, resolved, former smoker   Pulmonary exam normal breath sounds clear to auscultation       Cardiovascular hypertension, Normal cardiovascular exam+ dysrhythmias Atrial Fibrillation  Rhythm:Regular Rate:Normal     Neuro/Psych  PSYCHIATRIC DISORDERS Anxiety Depression    TIA   GI/Hepatic negative GI ROS, Neg liver ROS,,,  Endo/Other    Class 3 obesityhyperparathyroidism  Renal/GU negative Renal ROS  negative genitourinary   Musculoskeletal  (+) Arthritis ,    Abdominal  (+) + obese  Peds  Hematology negative hematology ROS (+)   Anesthesia Other Findings Eliquis  Echo 07/24/18: EF 60-65%, mild MR  Reproductive/Obstetrics                             Anesthesia Physical Anesthesia Plan  ASA: 3  Anesthesia Plan: General   Post-op Pain Management: Minimal or no pain anticipated   Induction: Intravenous  PONV Risk Score and Plan: Propofol infusion  Airway Management Planned: Nasal Cannula and Natural Airway  Additional Equipment: None  Intra-op Plan:   Post-operative Plan:   Informed Consent: I have reviewed the patients History and Physical, chart, labs and discussed the procedure including the risks, benefits and alternatives for the proposed anesthesia with the patient or authorized representative who has indicated his/her understanding and acceptance.     Dental advisory given  Plan Discussed with: CRNA  Anesthesia Plan  Comments:         Anesthesia Quick Evaluation

## 2023-04-11 DIAGNOSIS — M7542 Impingement syndrome of left shoulder: Secondary | ICD-10-CM | POA: Diagnosis not present

## 2023-04-11 DIAGNOSIS — M7541 Impingement syndrome of right shoulder: Secondary | ICD-10-CM | POA: Diagnosis not present

## 2023-04-23 DIAGNOSIS — Z961 Presence of intraocular lens: Secondary | ICD-10-CM | POA: Diagnosis not present

## 2023-04-23 DIAGNOSIS — H02043 Spastic entropion of right eye, unspecified eyelid: Secondary | ICD-10-CM | POA: Diagnosis not present

## 2023-04-23 DIAGNOSIS — H26492 Other secondary cataract, left eye: Secondary | ICD-10-CM | POA: Diagnosis not present

## 2023-04-23 DIAGNOSIS — H02046 Spastic entropion of left eye, unspecified eyelid: Secondary | ICD-10-CM | POA: Diagnosis not present

## 2023-04-23 DIAGNOSIS — H401133 Primary open-angle glaucoma, bilateral, severe stage: Secondary | ICD-10-CM | POA: Diagnosis not present

## 2023-04-24 DIAGNOSIS — M7542 Impingement syndrome of left shoulder: Secondary | ICD-10-CM | POA: Diagnosis not present

## 2023-04-27 DIAGNOSIS — M7542 Impingement syndrome of left shoulder: Secondary | ICD-10-CM | POA: Diagnosis not present

## 2023-05-01 DIAGNOSIS — M7542 Impingement syndrome of left shoulder: Secondary | ICD-10-CM | POA: Diagnosis not present

## 2023-05-03 DIAGNOSIS — M7542 Impingement syndrome of left shoulder: Secondary | ICD-10-CM | POA: Diagnosis not present

## 2023-05-08 DIAGNOSIS — M7542 Impingement syndrome of left shoulder: Secondary | ICD-10-CM | POA: Diagnosis not present

## 2023-05-15 DIAGNOSIS — M7542 Impingement syndrome of left shoulder: Secondary | ICD-10-CM | POA: Diagnosis not present

## 2023-05-17 DIAGNOSIS — M7542 Impingement syndrome of left shoulder: Secondary | ICD-10-CM | POA: Diagnosis not present

## 2023-05-22 ENCOUNTER — Ambulatory Visit: Payer: Medicare PPO | Attending: Student | Admitting: Student

## 2023-05-22 ENCOUNTER — Encounter: Payer: Self-pay | Admitting: Student

## 2023-05-22 DIAGNOSIS — M25512 Pain in left shoulder: Secondary | ICD-10-CM | POA: Diagnosis not present

## 2023-05-22 NOTE — Progress Notes (Deleted)
 Cardiology Office Note    Date:  05/22/2023  ID:  Kim Dixon, DOB 04-23-1940, MRN [id number] Cardiologist: Nona Dell, MD    History of Present Illness:    Kim Dixon is a 83 y.o. female  with past medical history of paroxysmal atrial fibrillation, chronic HFpEF, HTN, HLD and normal coronary arteries by cardiac catheterization in 11/2019 who presents to the office today for 69-month follow-up.  She was examined by myself in 03/2023 and a recent been diagnosed with recurrent atrial fibrillation with RVR at her office visit with Dr. Diona Dixon in 02/2023.  She did report having intermittent dyspnea but denied any chest pain or palpitations. Options were reviewed and a DCCV was recommended.  Was continued on Lopressor 50 mg twice daily and Eliquis 2.5 mg twice daily for anticoagulation.  Her procedure was scheduled for 04/06/2023 but she was in normal sinus rhythm at that time.  - adjust Eliquis dose!  ROS: ***  Studies Reviewed:   EKG: EKG is*** ordered today and demonstrates ***   EKG Interpretation Date/Time:    Ventricular Rate:    PR Interval:    QRS Duration:    QT Interval:    QTC Calculation:   R Axis:      Text Interpretation:         Echocardiogram: 09/2019 IMPRESSIONS     1. Left ventricular ejection fraction, by estimation, is 60 to 65%. The  left ventricle has normal function. The left ventricle has no regional  wall motion abnormalities. Left ventricular diastolic parameters were  normal.   2. Right ventricular systolic function is normal. The right ventricular  size is normal.   3. Left atrial size was moderately dilated.   4. The mitral valve is normal in structure. Trivial mitral valve  regurgitation. No evidence of mitral stenosis.   5. The aortic valve is tricuspid. Aortic valve regurgitation is not  visualized. No aortic stenosis is present.   6. The inferior vena cava is dilated in size with >50% respiratory  variability, suggesting  right atrial pressure of 8 mmHg.    LHC: 11/2019 The left ventricular systolic function is normal. LV end diastolic pressure is moderately elevated. The left ventricular ejection fraction is 55-65% by visual estimate.   1.  Normal coronary arteries. 2.  Normal LV systolic function.  Mitral annular calcifications noted. 3.  Moderately elevated left ventricular end-diastolic pressure at 24 mmHg.   Recommendations: The patient symptoms could be related to diastolic heart failure.  LVEDP was moderately elevated but the patient received aggressive precath hydration which might have contributed.  Continue torsemide but consider adjusting the dose as needed.  Risk Assessment/Calculations:   {Does this patient have ATRIAL FIBRILLATION?:[id number]} No BP recorded.  {Refresh Note OR Click here to enter BP  :1}***         Physical Exam:   VS:  There were no vitals taken for this visit.   Wt Readings from Last 3 Encounters:  04/05/23 265 lb (120.2 kg)  04/03/23 265 lb (120.2 kg)  02/26/23 267 lb (121.1 kg)     GEN: Well nourished, well developed in no acute distress NECK: No JVD; No carotid bruits CARDIAC: ***RRR, no murmurs, rubs, gallops RESPIRATORY:  Clear to auscultation without rales, wheezing or rhonchi  ABDOMEN: Appears non-distended. No obvious abdominal masses. EXTREMITIES: No clubbing or cyanosis. No edema.  Distal pedal pulses are 2+ bilaterally.   Assessment and Plan:   1. Paroxysmal Atrial Fibrillation/Flutter - ***  2.  Chronic HFpEF  -She has not been on SGLT2 inhibitor therapy given issues with urinary incontinence and increased risk of a UTI.  3. HTN - ***  4. Stage 3 CKD - Creatinine was previously at 1.67 when checked in 02/2023 and had improved to 1.09 when checked in 03/2023. Appears her baseline is usually less than 1.5.  Signed, Ellsworth Lennox, PA-C

## 2023-06-06 DIAGNOSIS — N1832 Chronic kidney disease, stage 3b: Secondary | ICD-10-CM | POA: Diagnosis not present

## 2023-06-06 DIAGNOSIS — I480 Paroxysmal atrial fibrillation: Secondary | ICD-10-CM | POA: Diagnosis not present

## 2023-06-06 DIAGNOSIS — I5030 Unspecified diastolic (congestive) heart failure: Secondary | ICD-10-CM | POA: Diagnosis not present

## 2023-06-06 DIAGNOSIS — E785 Hyperlipidemia, unspecified: Secondary | ICD-10-CM | POA: Diagnosis not present

## 2023-06-06 DIAGNOSIS — Z79899 Other long term (current) drug therapy: Secondary | ICD-10-CM | POA: Diagnosis not present

## 2023-06-06 DIAGNOSIS — E1129 Type 2 diabetes mellitus with other diabetic kidney complication: Secondary | ICD-10-CM | POA: Diagnosis not present

## 2023-06-11 ENCOUNTER — Telehealth: Payer: Self-pay | Admitting: Cardiology

## 2023-06-11 NOTE — Telephone Encounter (Signed)
    She had a follow-up visit scheduled for 05/22/2023 and no-showed for this. Can reschedule with myself or Dr. Diona Browner in 4-6 weeks unless she is having acute issues.   Signed, Ellsworth Lennox, PA-C 06/11/2023, 7:10 PM

## 2023-06-11 NOTE — Telephone Encounter (Signed)
 Patient would like to know when she needs to follow up with Dr. Diona Browner since she didn't have cardioversion.

## 2023-06-12 ENCOUNTER — Ambulatory Visit: Payer: Medicare PPO | Admitting: Cardiology

## 2023-06-12 NOTE — Telephone Encounter (Signed)
Sent to front office to schedule.

## 2023-06-13 DIAGNOSIS — I480 Paroxysmal atrial fibrillation: Secondary | ICD-10-CM | POA: Diagnosis not present

## 2023-06-13 DIAGNOSIS — E1122 Type 2 diabetes mellitus with diabetic chronic kidney disease: Secondary | ICD-10-CM | POA: Diagnosis not present

## 2023-06-13 DIAGNOSIS — N1832 Chronic kidney disease, stage 3b: Secondary | ICD-10-CM | POA: Diagnosis not present

## 2023-06-27 DIAGNOSIS — M7542 Impingement syndrome of left shoulder: Secondary | ICD-10-CM | POA: Diagnosis not present

## 2023-06-27 DIAGNOSIS — M25512 Pain in left shoulder: Secondary | ICD-10-CM | POA: Diagnosis not present

## 2023-07-25 ENCOUNTER — Encounter: Payer: Self-pay | Admitting: Student

## 2023-07-25 ENCOUNTER — Telehealth: Payer: Self-pay | Admitting: Cardiology

## 2023-07-25 ENCOUNTER — Ambulatory Visit: Attending: Student | Admitting: Student

## 2023-07-25 VITALS — BP 108/78 | HR 120 | Wt 264.0 lb

## 2023-07-25 DIAGNOSIS — N1832 Chronic kidney disease, stage 3b: Secondary | ICD-10-CM

## 2023-07-25 DIAGNOSIS — Z7901 Long term (current) use of anticoagulants: Secondary | ICD-10-CM | POA: Diagnosis not present

## 2023-07-25 DIAGNOSIS — I480 Paroxysmal atrial fibrillation: Secondary | ICD-10-CM | POA: Diagnosis not present

## 2023-07-25 DIAGNOSIS — I10 Essential (primary) hypertension: Secondary | ICD-10-CM | POA: Diagnosis not present

## 2023-07-25 DIAGNOSIS — I5032 Chronic diastolic (congestive) heart failure: Secondary | ICD-10-CM

## 2023-07-25 MED ORDER — APIXABAN 5 MG PO TABS: 5.0000 mg | ORAL_TABLET | Freq: Two times a day (BID) | ORAL | 3 refills | Status: DC

## 2023-07-25 MED ORDER — AMIODARONE HCL 200 MG PO TABS: ORAL_TABLET | ORAL | 0 refills | Status: DC

## 2023-07-25 NOTE — Telephone Encounter (Signed)
 Spoke with Tammy at [employer]. Informed that there is a drug interaction with pt Amiodarone, Metoprolol  and Lexapro. Interaction being prolonged QT interval and bradycardia.

## 2023-07-25 NOTE — Patient Instructions (Addendum)
 Medication Instructions:   INCREASE Eliquis  to 5 mg twice a day  START Amiodarone 200 mg TWICE a day for 1 week, THEN, reduce to 200 mg Once a day   Labwork: None today  Testing/Procedures: None today  Follow-Up:  Nurse visit in 2 weeks for EKG 6-8 weeks  Any Other Special Instructions Will Be Listed Below (If Applicable).  If you need a refill on your cardiac medications before your next appointment, please call your pharmacy.

## 2023-07-25 NOTE — Telephone Encounter (Signed)
 Calling about a drug interaction for the patient's medication. Please advise

## 2023-07-25 NOTE — Progress Notes (Signed)
 Cardiology Office Note    Date:  07/25/2023  ID:  Kim Dixon, DOB [birth year], MRN [id number] Cardiologist: Teddie Favre, MD    History of Present Illness:    Kim Dixon is a 83 y.o. female with past medical history of paroxysmal atrial fibrillation, chronic HFpEF, HTN, HLD and normal coronary arteries by cardiac catheterization in 11/2019 who presents to the office today for 45-month follow-up.   She was examined by myself in 03/2023 and had recently been diagnosed with recurrent atrial fibrillation with RVR at her office visit with Dr. Londa Rival in 02/2023. She did report having intermittent dyspnea but denied any chest pain or palpitations. Options were reviewed and a DCCV was recommended. Was continued on Lopressor  50 mg twice daily and Eliquis  2.5 mg twice daily for anticoagulation. Her procedure was scheduled for 04/06/2023 but she was in normal sinus rhythm at that time.   In talking with the patient today, she reports her biggest issue over the past few months has been worsening depression. She has been sleeping for up to 20 hours some days due to this.  She is on Lexapro but does not feel like this is helping and plans to review with her PCP next month. Says this typically happens on a yearly basis as April is a challenging month for her given this is the anniversary of several deaths of family members and friends. She overall believes she is asymptomatic with her atrial fibrillation/flutter. Denies any specific chest pain or palpitations. Does report an occasional pressure along her upper sternal region but this is variable and can occur at rest or with activity. She thought symptoms might be due to asthma.  No recent orthopnea, PND or pitting edema. She does take Torsemide  60 mg daily on a regular basis. Says that she sometimes forgets her evening doses of Eliquis  as she does not take the initial dose until mid afternoon or later.  Studies Reviewed:   EKG: EKG is ordered  today and demonstrates:   EKG Interpretation Date/Time:  Wednesday Jul 25 2023 15:54:19 EDT Ventricular Rate:  121 PR Interval:    QRS Duration:  72 QT Interval:  198 QTC Calculation: 281 R Axis:   149  Text Interpretation: Atrial flutter with 2:1 A-V conduction Right axis deviation Pulmonary disease pattern Confirmed by Woodfin Hays ([id number]) on 07/25/2023 4:38:18 PM       Echocardiogram: 09/2019 IMPRESSIONS     1. Left ventricular ejection fraction, by estimation, is 60 to 65%. The  left ventricle has normal function. The left ventricle has no regional  wall motion abnormalities. Left ventricular diastolic parameters were  normal.   2. Right ventricular systolic function is normal. The right ventricular  size is normal.   3. Left atrial size was moderately dilated.   4. The mitral valve is normal in structure. Trivial mitral valve  regurgitation. No evidence of mitral stenosis.   5. The aortic valve is tricuspid. Aortic valve regurgitation is not  visualized. No aortic stenosis is present.   6. The inferior vena cava is dilated in size with >50% respiratory  variability, suggesting right atrial pressure of 8 mmHg.      LHC: 11/2019 The left ventricular systolic function is normal. LV end diastolic pressure is moderately elevated. The left ventricular ejection fraction is 55-65% by visual estimate.   1.  Normal coronary arteries. 2.  Normal LV systolic function.  Mitral annular calcifications noted. 3.  Moderately elevated left ventricular end-diastolic pressure at 24 mmHg.  Recommendations: The patient symptoms could be related to diastolic heart failure.  LVEDP was moderately elevated but the patient received aggressive precath hydration which might have contributed.  Continue torsemide  but consider adjusting the dose as needed.   Risk Assessment/Calculations:    CHA2DS2-VASc Score = 4   This indicates a 4.8% annual risk of stroke. The patient's score is  based upon: CHF History: 0 HTN History: 1 Diabetes History: 0 Stroke History: 0 Vascular Disease History: 0 Age Score: 2 Gender Score: 1   Physical Exam:   VS:  BP 108/78 (BP Location: Left Arm, Cuff Size: Large)   Pulse (!) 120   Wt 264 lb (119.7 kg)   SpO2 94%   BMI 46.03 kg/m    Wt Readings from Last 3 Encounters:  07/25/23 264 lb (119.7 kg)  04/05/23 265 lb (120.2 kg)  04/03/23 265 lb (120.2 kg)     GEN: Well nourished, well developed female appearing in no acute distress NECK: No JVD; No carotid bruits CARDIAC: Irregularly irregular, tachycardiac.  RESPIRATORY:  Clear to auscultation without rales, wheezing or rhonchi  ABDOMEN: Appears non-distended. No obvious abdominal masses. EXTREMITIES: No clubbing or cyanosis. No pitting edema.  Distal pedal pulses are 2+ bilaterally.   Assessment and Plan:   1. Paroxysmal Atrial Fibrillation/Flutter - She has a history of known paroxysmal atrial fibrillation/flutter and had converted back to normal sinus rhythm prior to her recently scheduled DCCV. She is back in atrial flutter today and heart rate was initially recorded in the 120's but had improved to the 90's on recheck. - Reviewed with Dr. Londa Rival and will start antiarrhythmic therapy with Amiodarone 200 mg twice daily for 1 week followed by 200 mg daily. Will arrange for a nurse visit in 2 weeks for a repeat EKG. If she is still in atrial fibrillation/flutter, could arrange for DCCV following 3 weeks of uninterrupted anticoagulation. As discussed above, she has only been taking this once daily and we reviewed switching from Eliquis  to Xarelto given this but she wishes to continue her current medical therapy at this time. Will increase Eliquis  to 5 mg twice daily as this is the correct dose given her current age, weight and renal function.   - Continue Lopressor  50 mg twice daily for rate-control. If she continues to only take medications once daily, would switch to Toprol -XL 100  mg daily.   2. Chronic HFpEF - She appears euvolemic by examination today and denies any recent respiratory issues.  Continue Torsemide  60 mg daily. Creatinine was stable at 1.09 when checked in 03/2023. She has not been on SGLT2 inhibitor therapy given issues with urinary incontinence and increased risk of a UTI.   3. HTN - BP is well-controlled at 108/78 during today's visit. Continue current medical therapy with Losartan  25 mg daily and Lopressor  50 mg twice daily. If she continues to experience issues with compliance with twice daily medications, could switch Lopressor  to Toprol -XL for sustained release.   4. Stage 3 CKD - Creatinine was previously at 1.67 when checked in 02/2023 and had improved to 1.09 when checked in 03/2023. Appears her baseline is usually less than 1.5.  Signed, Dorma Gash, PA-C

## 2023-08-07 ENCOUNTER — Telehealth: Payer: Self-pay | Admitting: Cardiology

## 2023-08-07 NOTE — Telephone Encounter (Signed)
 Nurse appt changed to 4 pm. Pt notified and verbalized understanding.

## 2023-08-07 NOTE — Telephone Encounter (Signed)
 Patient says she has an appointment in [place] before nurse visit and would like to know if it can be moved out to about 4:00 PM, so the appointments don't overlap and she doesn't have to rush.

## 2023-08-08 ENCOUNTER — Ambulatory Visit: Attending: Cardiology | Admitting: *Deleted

## 2023-08-08 DIAGNOSIS — M7542 Impingement syndrome of left shoulder: Secondary | ICD-10-CM | POA: Diagnosis not present

## 2023-08-08 DIAGNOSIS — I480 Paroxysmal atrial fibrillation: Secondary | ICD-10-CM

## 2023-08-08 NOTE — Progress Notes (Signed)
 Pt reports blood in stool that started 07/31/23. The first day there was blood in stool and urine. After a day the blood in the urine resolved but there was dark tarry stools. Pt does c/o SOB. Yesterday stool was more natural color.

## 2023-08-09 ENCOUNTER — Telehealth: Payer: Self-pay | Admitting: *Deleted

## 2023-08-09 DIAGNOSIS — K921 Melena: Secondary | ICD-10-CM

## 2023-08-09 MED ORDER — METOPROLOL TARTRATE 25 MG PO TABS: 25.0000 mg | ORAL_TABLET | Freq: Two times a day (BID) | ORAL | Status: DC

## 2023-08-09 NOTE — Telephone Encounter (Signed)
 Patient is returning phone call.

## 2023-08-09 NOTE — Telephone Encounter (Signed)
 Called pt to have come in for labs. No answer. Left msg to call back.

## 2023-08-09 NOTE — Telephone Encounter (Signed)
  EKG shows she is back in normal heart rhythm, no need to consider cardioversion. Heart rate is a little low, please lower lopressor  to 25mg  bid. Needs to report bleeding issues to pcp. If severe and progressing can hold the eliquis  for 3 days and then try to restart   Kim Raw MD       I spoke with daughter, Kim Dixon.   They will reduce lopressor  to 25 mg bid and go to [facility] tomorrow for occult stool card and cbc. I relayed Dr.Branch's message which directed holding her eliquis  for 3 days if it is severe and progressing.    Daughter states they spoke with Dr.Fagans office regarding her blood in stool and were advised to "watch a few more days".   We will forward Dr.Branch's message to Dr.Fagan.

## 2023-08-09 NOTE — Progress Notes (Signed)
 EKG shows she is back in normal heart rhythm, no need to consider cardioversion. Heart rate is a little low, please lower lopressor  to 25mg  bid. Needs to report bleeding issues to pcp. If severe and progressing can hold the eliquis  for 3 days and then try to restart  Letta Raw MD

## 2023-08-09 NOTE — Telephone Encounter (Signed)
-----   Message from Cadence Rebekah Canada sent at 08/09/2023  9:38 AM EDT ----- Can we bring patient in for FOBT and CBC please? ----- Message ----- From: Aubry Blase, LPN Sent: 1/61/0960   4:13 PM EDT To: Gerard Knight, MD; Laurann Pollock, MD; #

## 2023-08-21 ENCOUNTER — Telehealth: Payer: Self-pay

## 2023-08-21 NOTE — Progress Notes (Signed)
   08/21/2023  Patient ID: Kim Dixon, female   DOB: 11-Mar-1941, 83 y.o.   MRN: [id number]  Adherence Monitoring:  Overdue on atorvastatin fills, left voicemail to discuss adherence. Documentation in [employer].   Flint Hummer, PharmD

## 2023-08-22 DIAGNOSIS — H401133 Primary open-angle glaucoma, bilateral, severe stage: Secondary | ICD-10-CM | POA: Diagnosis not present

## 2023-08-22 DIAGNOSIS — H26492 Other secondary cataract, left eye: Secondary | ICD-10-CM | POA: Diagnosis not present

## 2023-08-22 DIAGNOSIS — Z961 Presence of intraocular lens: Secondary | ICD-10-CM | POA: Diagnosis not present

## 2023-08-24 ENCOUNTER — Other Ambulatory Visit (HOSPITAL_COMMUNITY)
Admission: RE | Admit: 2023-08-24 | Discharge: 2023-08-24 | Disposition: A | Discharge status: Home / Self Care | Source: Ambulatory Visit | Attending: Medical | Admitting: Medical

## 2023-08-24 ENCOUNTER — Ambulatory Visit: Payer: Self-pay | Admitting: Medical

## 2023-08-24 DIAGNOSIS — K921 Melena: Secondary | ICD-10-CM

## 2023-08-24 LAB — CBC
HCT: 27.6 % — ABNORMAL LOW (ref 36.0–46.0)
Hemoglobin: 8.7 g/dL — ABNORMAL LOW (ref 12.0–15.0)
MCH: 32.3 pg (ref 26.0–34.0)
MCHC: 31.5 g/dL (ref 30.0–36.0)
MCV: 102.6 fL — ABNORMAL HIGH (ref 80.0–100.0)
Platelets: 193 10*3/uL (ref 150–400)
RBC: 2.69 MIL/uL — ABNORMAL LOW (ref 3.87–5.11)
RDW: 15.8 % — ABNORMAL HIGH (ref 11.5–15.5)
WBC: 7.6 10*3/uL (ref 4.0–10.5)
nRBC: 0 % (ref 0.0–0.2)

## 2023-08-24 LAB — OCCULT BLOOD X 1 CARD TO LAB, STOOL: Fecal Occult Bld: POSITIVE — AB

## 2023-08-27 ENCOUNTER — Emergency Department (HOSPITAL_COMMUNITY)
Admission: EM | Admit: 2023-08-27 | Discharge: 2023-08-27 | Disposition: A | Discharge status: Home / Self Care | Attending: Emergency Medicine | Admitting: Emergency Medicine

## 2023-08-27 ENCOUNTER — Encounter (HOSPITAL_COMMUNITY): Payer: Self-pay

## 2023-08-27 ENCOUNTER — Emergency Department (HOSPITAL_COMMUNITY)

## 2023-08-27 ENCOUNTER — Other Ambulatory Visit: Payer: Self-pay

## 2023-08-27 DIAGNOSIS — Z7901 Long term (current) use of anticoagulants: Secondary | ICD-10-CM | POA: Diagnosis not present

## 2023-08-27 DIAGNOSIS — R0609 Other forms of dyspnea: Secondary | ICD-10-CM | POA: Diagnosis not present

## 2023-08-27 DIAGNOSIS — Z87891 Personal history of nicotine dependence: Secondary | ICD-10-CM | POA: Insufficient documentation

## 2023-08-27 DIAGNOSIS — Z8673 Personal history of transient ischemic attack (TIA), and cerebral infarction without residual deficits: Secondary | ICD-10-CM | POA: Diagnosis not present

## 2023-08-27 DIAGNOSIS — K625 Hemorrhage of anus and rectum: Secondary | ICD-10-CM | POA: Diagnosis not present

## 2023-08-27 DIAGNOSIS — Z85828 Personal history of other malignant neoplasm of skin: Secondary | ICD-10-CM | POA: Diagnosis not present

## 2023-08-27 DIAGNOSIS — I10 Essential (primary) hypertension: Secondary | ICD-10-CM | POA: Insufficient documentation

## 2023-08-27 DIAGNOSIS — Z96652 Presence of left artificial knee joint: Secondary | ICD-10-CM | POA: Insufficient documentation

## 2023-08-27 DIAGNOSIS — R0602 Shortness of breath: Secondary | ICD-10-CM | POA: Insufficient documentation

## 2023-08-27 DIAGNOSIS — F419 Anxiety disorder, unspecified: Secondary | ICD-10-CM | POA: Insufficient documentation

## 2023-08-27 DIAGNOSIS — I480 Paroxysmal atrial fibrillation: Secondary | ICD-10-CM | POA: Diagnosis not present

## 2023-08-27 DIAGNOSIS — R7989 Other specified abnormal findings of blood chemistry: Secondary | ICD-10-CM | POA: Insufficient documentation

## 2023-08-27 DIAGNOSIS — R944 Abnormal results of kidney function studies: Secondary | ICD-10-CM | POA: Diagnosis not present

## 2023-08-27 DIAGNOSIS — R42 Dizziness and giddiness: Secondary | ICD-10-CM | POA: Diagnosis not present

## 2023-08-27 DIAGNOSIS — D649 Anemia, unspecified: Secondary | ICD-10-CM | POA: Insufficient documentation

## 2023-08-27 DIAGNOSIS — E876 Hypokalemia: Secondary | ICD-10-CM | POA: Diagnosis not present

## 2023-08-27 DIAGNOSIS — G4733 Obstructive sleep apnea (adult) (pediatric): Secondary | ICD-10-CM | POA: Insufficient documentation

## 2023-08-27 DIAGNOSIS — R531 Weakness: Secondary | ICD-10-CM | POA: Diagnosis not present

## 2023-08-27 LAB — COMPREHENSIVE METABOLIC PANEL WITH GFR
ALT: 12 U/L (ref 0–44)
AST: 20 U/L (ref 15–41)
Albumin: 3.5 g/dL (ref 3.5–5.0)
Alkaline Phosphatase: 50 U/L (ref 38–126)
Anion gap: 14 (ref 5–15)
BUN: 24 mg/dL — ABNORMAL HIGH (ref 8–23)
CO2: 27 mmol/L (ref 22–32)
Calcium: 8.8 mg/dL — ABNORMAL LOW (ref 8.9–10.3)
Chloride: 99 mmol/L (ref 98–111)
Creatinine, Ser: 1.2 mg/dL — ABNORMAL HIGH (ref 0.44–1.00)
GFR, Estimated: 45 mL/min — ABNORMAL LOW (ref >60)
Glucose, Bld: 137 mg/dL — ABNORMAL HIGH (ref 70–99)
Potassium: 3.1 mmol/L — ABNORMAL LOW (ref 3.5–5.1)
Sodium: 140 mmol/L (ref 135–145)
Total Bilirubin: 1 mg/dL (ref 0.0–1.2)
Total Protein: 6.7 g/dL (ref 6.5–8.1)

## 2023-08-27 LAB — RETICULOCYTES
Immature Retic Fract: 26.3 % — ABNORMAL HIGH (ref 2.3–15.9)
RBC.: 2.65 MIL/uL — ABNORMAL LOW (ref 3.87–5.11)
Retic Count, Absolute: 114.5 10*3/uL (ref 19.0–186.0)
Retic Ct Pct: 4.3 % — ABNORMAL HIGH (ref 0.4–3.1)

## 2023-08-27 LAB — CBC WITH DIFFERENTIAL/PLATELET
Abs Immature Granulocytes: 0.01 10*3/uL (ref 0.00–0.07)
Basophils Absolute: 0.1 10*3/uL (ref 0.0–0.1)
Basophils Relative: 2 %
Eosinophils Absolute: 0.2 10*3/uL (ref 0.0–0.5)
Eosinophils Relative: 3 %
HCT: 30.5 % — ABNORMAL LOW (ref 36.0–46.0)
Hemoglobin: 9.5 g/dL — ABNORMAL LOW (ref 12.0–15.0)
Immature Granulocytes: 0 %
Lymphocytes Relative: 25 %
Lymphs Abs: 1.2 10*3/uL (ref 0.7–4.0)
MCH: 32.5 pg (ref 26.0–34.0)
MCHC: 31.1 g/dL (ref 30.0–36.0)
MCV: 104.5 fL — ABNORMAL HIGH (ref 80.0–100.0)
Monocytes Absolute: 0.4 10*3/uL (ref 0.1–1.0)
Monocytes Relative: 8 %
Neutro Abs: 3 10*3/uL (ref 1.7–7.7)
Neutrophils Relative %: 62 %
Platelets: UNDETERMINED 10*3/uL (ref 150–400)
RBC: 2.92 MIL/uL — ABNORMAL LOW (ref 3.87–5.11)
RDW: 14.9 % (ref 11.5–15.5)
Smear Review: UNDETERMINED
WBC: 4.8 10*3/uL (ref 4.0–10.5)
nRBC: 0 % (ref 0.0–0.2)

## 2023-08-27 LAB — IRON AND TIBC
Iron: 27 ug/dL — ABNORMAL LOW (ref 28–170)
Saturation Ratios: 6 % — ABNORMAL LOW (ref 10.4–31.8)
TIBC: 484 ug/dL — ABNORMAL HIGH (ref 250–450)
UIBC: 457 ug/dL

## 2023-08-27 LAB — FOLATE: Folate: 11.6 ng/mL (ref >5.9)

## 2023-08-27 LAB — TROPONIN I (HIGH SENSITIVITY)
Troponin I (High Sensitivity): 7 ng/L (ref <18)
Troponin I (High Sensitivity): 7 ng/L (ref <18)

## 2023-08-27 LAB — PROTIME-INR
INR: 1 (ref 0.8–1.2)
Prothrombin Time: 13.5 s (ref 11.4–15.2)

## 2023-08-27 LAB — VITAMIN B12: Vitamin B-12: 663 pg/mL (ref 180–914)

## 2023-08-27 LAB — LIPASE, BLOOD: Lipase: 60 U/L — ABNORMAL HIGH (ref 11–51)

## 2023-08-27 LAB — FERRITIN: Ferritin: 20 ng/mL (ref 11–307)

## 2023-08-27 LAB — BRAIN NATRIURETIC PEPTIDE: B Natriuretic Peptide: 430 pg/mL — ABNORMAL HIGH (ref 0.0–100.0)

## 2023-08-27 MED ORDER — IPRATROPIUM-ALBUTEROL 0.5-2.5 (3) MG/3ML IN SOLN
3.0000 mL | Freq: Once | RESPIRATORY_TRACT | Status: AC
  Administered 2023-08-27: 3 mL via RESPIRATORY_TRACT
  Filled 2023-08-27: qty 3

## 2023-08-27 MED ORDER — SODIUM CHLORIDE 0.9 % IV BOLUS
1000.0000 mL | Freq: Once | INTRAVENOUS | Status: AC
  Administered 2023-08-27: 1000 mL via INTRAVENOUS

## 2023-08-27 MED ORDER — PANTOPRAZOLE SODIUM 40 MG PO TBEC: 40.0000 mg | DELAYED_RELEASE_TABLET | Freq: Every day | ORAL | 0 refills | Status: DC

## 2023-08-27 MED ORDER — POTASSIUM CHLORIDE CRYS ER 20 MEQ PO TBCR
40.0000 meq | EXTENDED_RELEASE_TABLET | Freq: Once | ORAL | Status: AC
  Administered 2023-08-27: 40 meq via ORAL
  Filled 2023-08-27: qty 2

## 2023-08-27 MED ORDER — SUCRALFATE 1 G PO TABS: 1.0000 g | ORAL_TABLET | Freq: Three times a day (TID) | ORAL | 0 refills | Status: DC

## 2023-08-27 NOTE — ED Provider Notes (Signed)
 [facility] EMERGENCY DEPARTMENT AT [facility] Provider Note  CSN: [id number] Arrival date & time: 08/27/23 4098  Chief Complaint(s) Abnormal Labs  HPI Kim Dixon is a 83 y.o. female with past medical history as below, significant for OSA, PAF, TIA, HTN, BCC, anxiety who presents to the ED with complaint of abn hgb.  Patient reports dark-colored stool over the past week, she was seen by PCP and had labs drawn and advised to come back to the hospital on Friday but she had plans over the weekend and did not want to miss them.  Her Eliquis  was stopped at that time.  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  No BRBPR.  No abdominal pain or rectal pain.  Past Medical History Past Medical History:  Diagnosis Date   Anxiety    Arthritis    Basal cell carcinoma    RIGHT THIGH BCC CX3 5FU   Basal cell carcinoma 12/16/2019   right anterior neck (CX35FU)   Depression    Diverticulosis    Essential hypertension    Glaucoma    Hematuria    History of kidney stones    History of pneumonia    Hyperlipidemia    OSA (obstructive sleep apnea) 10/30/2022   PAF (paroxysmal atrial fibrillation) (HCC)    Pneumonia    SCC (squamous cell carcinoma) 2023   left forearm inf. tx cx3 74fu   Squamous cell carcinoma of skin 2023   left forearm sup tx cx3 13fu   Stress incontinence    TIA (transient ischemic attack)    January 2020   Urinary frequency    Varicose veins    Patient Active Problem List   Diagnosis Date Noted   Atrial fibrillation (HCC) 04/03/2023   OSA (obstructive sleep apnea) 10/30/2022   Osteoarthritis of right knee 09/03/2022   Accelerating angina (HCC)    Hyperparathyroidism, primary (HCC) 04/13/2019   TIA (transient ischemic attack) 06/04/2018   Sebaceous cyst    Obese 08/26/2014   S/P left TKA 08/25/2014   S/P knee replacement 08/25/2014   Varicose veins of leg with complications 01/26/2014    FATIGUE / MALAISE 12/29/2008   DYSPNEA 12/29/2008   ABNORMAL CV (STRESS) TEST 12/29/2008   DEPRESSION 12/21/2008   Essential hypertension 12/21/2008   Home Medication(s) Prior to Admission medications   Medication Sig Start Date End Date Taking? Authorizing Provider  MYRBETRIQ 25 MG TB24 tablet Take 25 mg by mouth daily. 03/09/23  Yes [provider]  pantoprazole (PROTONIX) 40 MG tablet Take 1 tablet (40 mg total) by mouth daily for 14 days. 08/27/23 09/10/23 Yes Russella Courts A, DO  sucralfate (CARAFATE) 1 g tablet Take 1 tablet (1 g total) by mouth with breakfast, with lunch, and with evening meal for 7 days. 08/27/23 09/03/23 Yes Teddi Favors, DO  albuterol  (VENTOLIN  HFA) 108 (90 Base) MCG/ACT inhaler Inhale 2 puffs into the lungs every 6 (six) hours as needed for wheezing or shortness of breath. 05/17/21   Sood, Vineet, MD  amiodarone  (PACERONE ) 200 MG tablet Take 1 tablet (200 mg total) by mouth 2 (two) times daily for 7 days, THEN 1 tablet (200 mg total) daily. 07/25/23 10/30/23  Strader, Dimple Francis, PA-C  apixaban  (ELIQUIS ) 5 MG TABS tablet Take 1 tablet (5 mg total) by mouth 2 (two) times daily. 07/25/23   Strader, Dimple Francis, PA-C  Ascorbic Acid (VITAMIN C) 1000 MG tablet Take 3,000 mg  by mouth daily.     [provider]  atorvastatin (LIPITOR) 10 MG tablet Take 10 mg by mouth every Monday, Wednesday, and Friday at 8 PM.     [provider]  bimatoprost (LUMIGAN) 0.01 % SOLN Place 1 drop into both eyes at bedtime.    [provider]  Cyanocobalamin (VITAMIN B-12 SL) Place 1 tablet under the tongue daily.    [provider]  escitalopram (LEXAPRO) 10 MG tablet Take 10 mg by mouth daily. 02/08/22   [provider]  fluticasone  furoate-vilanterol (BREO ELLIPTA ) 100-25 MCG/ACT AEPB Inhale 1 puff into the lungs daily. Patient taking differently: Inhale 1 puff into the lungs daily. TAKES PRN 06/05/22   Sood, Vineet, MD  levETIRAcetam (KEPPRA) 500 MG  tablet Take 250 mg by mouth 2 (two) times daily. 02/14/21   [provider]  losartan  (COZAAR ) 25 MG tablet Take 1 tablet (25 mg total) by mouth daily. 04/03/23 07/25/23  Strader, Brittany M, PA-C  Menthol , Topical Analgesic, (BIOFREEZE ROLL-ON EX) Apply 1 application topically 3 (three) times daily as needed (back pain.).    [provider]  metoprolol  tartrate (LOPRESSOR ) 25 MG tablet Take 1 tablet (25 mg total) by mouth 2 (two) times daily. 08/09/23 11/07/23  Laurann Pollock, MD  potassium chloride  SA (KLOR-CON  M) 20 MEQ tablet TAKE ONE TABLET ( TOTAL) BY MOUTH DAILY 12/15/22   Gerard Knight, MD  timolol (TIMOPTIC) 0.5 % ophthalmic solution Place 1 drop into both eyes daily. 10/23/17   [provider]  torsemide  (DEMADEX ) 20 MG tablet TAKE THREE TABLETS BY MOUTH ONCE A DAY. 03/06/23   Gerard Knight, MD  Vitamin D3 (VITAMIN D) 25 MCG tablet Take 1,000 Units by mouth daily.    [provider]                                                                                                                                    Past Surgical History Past Surgical History:  Procedure Laterality Date   ABDOMINAL HYSTERECTOMY     ABLATION SAPHENOUS VEIN W/ RFA Bilateral    CATARACT EXTRACTION Bilateral    COLONOSCOPY     COLONOSCOPY N/A 06/18/2013   Procedure: COLONOSCOPY;  Surgeon: Ruby Corporal, MD;  Location: [facility];  Service: Endoscopy;  Laterality: N/A;  830   CYSTOSCOPY WITH RETROGRADE PYELOGRAM, URETEROSCOPY AND STENT PLACEMENT Left 10/18/2018   Procedure: CYSTOSCOPY WITH RETROGRADE PYELOGRAM, URETEROSCOPY AND STENT PLACEMENT;  Surgeon: Osborn Blaze, MD;  Location: [facility];  Service: Urology;  Laterality: Left;  1 HR   EYE SURGERY     laser surgery bilat    HOLMIUM LASER APPLICATION Left 10/18/2018   Procedure: HOLMIUM LASER APPLICATION;  Surgeon: Osborn Blaze, MD;  Location: [facility];  Service: Urology;  Laterality: Left;   LEFT HEART  CATH AND CORONARY ANGIOGRAPHY N/A 11/28/2019   Procedure: LEFT HEART CATH AND CORONARY  ANGIOGRAPHY;  Surgeon: Wenona Hamilton, MD;  Location: [facility] INVASIVE CV LAB;  Service: Cardiovascular;  Laterality: N/A;   LITHOTRIPSY     MASS EXCISION N/A 12/07/2017   Procedure: EXCISION 3CM CYST ON BACK;  Surgeon: Alanda Allegra, MD;  Location: [facility];  Service: General;  Laterality: N/A;   PARATHYROIDECTOMY Left 04/22/2019   Procedure: LEFT INFERIOR PARATHYROIDECTOMY;  Surgeon: Oralee Billow, MD;  Location: [facility];  Service: General;  Laterality: Left;   TONSILLECTOMY     TOTAL KNEE ARTHROPLASTY Left 08/25/2014   Procedure: LEFT TOTAL KNEE ARTHROPLASTY;  Surgeon: Claiborne Crew, MD;  Location: [facility];  Service: Orthopedics;  Laterality: Left;   Family History Family History  Problem Relation Age of Onset   Coronary artery disease Mother    Lung cancer Father    Colon cancer Neg Hx    Stomach cancer Neg Hx     Social History Social History   Tobacco Use   Smoking status: Former    Current packs/day: 0.00    Average packs/day: 3.0 packs/day for 30.0 years (90.0 ttl pk-yrs)    Types: Cigarettes    Start date: 04/05/1963    Quit date: 04/04/1993    Years since quitting: 30.4   Smokeless tobacco: Never  Vaping Use   Vaping status: Never Used  Substance Use Topics   Alcohol use: Yes    Comment: Nightly glass of wine   Drug use: No   Allergies Naproxen sodium  Review of Systems A thorough review of systems was obtained and all systems are negative except as noted in the HPI and PMH.   Physical Exam Vital Signs  I have reviewed the triage vital signs BP 109/71   Pulse (!) 108   Temp 97.6 F (36.4 C)   Resp 18   SpO2 95%  Physical Exam Vitals and nursing note reviewed.  Constitutional:      General: She is not in acute distress.    Appearance: Normal appearance. She is obese.  HENT:     Head: Normocephalic and atraumatic.     Right Ear: External ear normal.     Left Ear: External  ear normal.     Nose: Nose normal.     Mouth/Throat:     Mouth: Mucous membranes are moist.  Eyes:     General: No scleral icterus.       Right eye: No discharge.        Left eye: No discharge.  Cardiovascular:     Rate and Rhythm: Normal rate and regular rhythm.     Pulses: Normal pulses.     Heart sounds: Normal heart sounds.  Pulmonary:     Effort: Pulmonary effort is normal. No respiratory distress.     Breath sounds: Normal breath sounds. No stridor.  Abdominal:     General: Abdomen is flat. There is no distension.     Palpations: Abdomen is soft.     Tenderness: There is no abdominal tenderness.  Musculoskeletal:     Cervical back: No rigidity.     Right lower leg: No edema.     Left lower leg: No edema.  Skin:    General: Skin is warm and dry.     Capillary Refill: Capillary refill takes less than 2 seconds.  Neurological:     Mental Status: She is alert.  Psychiatric:        Mood and Affect: Mood normal.        Behavior: Behavior normal. Behavior is  cooperative.     ED Results and Treatments Labs (all labs ordered are listed, but only abnormal results are displayed) Labs Reviewed  CBC WITH DIFFERENTIAL/PLATELET - Abnormal; Notable for the following components:      Result Value   RBC 2.92 (*)    Hemoglobin 9.5 (*)    HCT 30.5 (*)    MCV 104.5 (*)    All other components within normal limits  COMPREHENSIVE METABOLIC PANEL WITH GFR - Abnormal; Notable for the following components:   Potassium 3.1 (*)    Glucose, Bld 137 (*)    BUN 24 (*)    Creatinine, Ser 1.20 (*)    Calcium 8.8 (*)    GFR, Estimated 45 (*)    All other components within normal limits  LIPASE, BLOOD - Abnormal; Notable for the following components:   Lipase 60 (*)    All other components within normal limits  BRAIN NATRIURETIC PEPTIDE - Abnormal; Notable for the following components:   B Natriuretic Peptide 430.0 (*)    All other components within normal limits  RETICULOCYTES -  Abnormal; Notable for the following components:   Retic Ct Pct 4.3 (*)    RBC. 2.65 (*)    Immature Retic Fract 26.3 (*)    All other components within normal limits  PROTIME-INR  VITAMIN B12  FOLATE  IRON AND TIBC  FERRITIN  TROPONIN I (HIGH SENSITIVITY)  TROPONIN I (HIGH SENSITIVITY)                                                                                                                          Radiology DG Chest Portable 1 View Result Date: 08/27/2023 CLINICAL DATA:  Low hemoglobin and rectal bleeding. Weakness with shortness of breath and dizziness. EXAM: PORTABLE CHEST 1 VIEW COMPARISON:  06/05/2022 FINDINGS: The cardio pericardial silhouette is enlarged. Interstitial markings are diffusely coarsened with chronic features. The lungs are clear without focal pneumonia, edema, pneumothorax or pleural effusion. No acute bony abnormality. IMPRESSION: Chronic interstitial coarsening without acute cardiopulmonary findings. Electronically Signed   By: Donnal Fusi M.D.   On: 08/27/2023 09:00    Pertinent labs & imaging results that were available during my care of the patient were reviewed by me and considered in my medical decision making (see MDM for details).  Medications Ordered in ED Medications  ipratropium-albuterol  (DUONEB) 0.5-2.5 (3) MG/3ML nebulizer solution 3 mL (3 mLs Nebulization Given 08/27/23 0919)  sodium chloride  0.9 % bolus 1,000 mL (0 mLs Intravenous Stopped 08/27/23 1141)  potassium chloride  SA (KLOR-CON  M) CR tablet 40 mEq (40 mEq Oral Given 08/27/23 1006)  Procedures Procedures  (including critical care time)  Medical Decision Making / ED Course    Medical Decision Making:    ALTON TREMBLAY is a 83 y.o. female  with past medical history as below, significant for OSA, PAF, TIA, HTN, BCC, anxiety who presents to the ED with  complaint of abn hgb.. The complaint involves an extensive differential diagnosis and also carries with it a high risk of complications and morbidity.  Serious etiology was considered. Ddx includes but is not limited to: Upper GI bleed, lower GI bleed, AVM, hemorrhoid, medication effect, nonbleeding sources of normal stool, etc.  Complete initial physical exam performed, notably the patient was in distress no hypoxia.    Reviewed and confirmed nursing documentation for past medical history, family history, social history.  Vital signs reviewed.     Brief summary:  83 year old female history as above here with concern of abnormal hemoglobin, possible GI bleed She reports her dark stool has improved, she is no longer having black-colored stool. She had some mild dyspnea this morning, did not take her inhaler.  Feeling better after DuoNeb   Clinical Course as of 08/27/23 1204  Mon Aug 27, 2023  0957 Potassium(!): 3.1 replace [SG]  0957 Creatinine(!): 1.20 Mild elev from prior, will ivf [SG]  1009 Hemoglobin(!): 9.5 8.7 on Friday [SG]    Clinical Course User Index [SG] Teddi Favors, DO     Hemoglobin is low but improving.  Send anemia panel She is HDS. Likely upper GI bleed, seems to be improving.  No hematemesis.  No frequent alcohol use.  No frequent NSAID use. May continue to hold Eliquis , recheck hemoglobin in the next 2 days with PCP.  Started on PPI and Carafate.  Follow-up with GI within 2 weeks for endoscopic evaluation She has chronic dyspnea, feels as though she is breathing at her typical baseline, no cp at this time.  Potassium replaced  Patient in no distress and overall condition improved here in the ED. Detailed discussions were had with the patient/guardian regarding current findings, and need for close f/u with PCP or on call doctor. The patient/guardian has been instructed to return immediately if the symptoms worsen in any way for re-evaluation. Patient/guardian  verbalized understanding and is in agreement with current care plan. All questions answered prior to discharge.              Additional history obtained: -Additional history obtained from na -External records from outside source obtained and reviewed including: Chart review including previous notes, labs, imaging, consultation notes including  prior labs, home medications   Lab Tests: -I ordered, reviewed, and interpreted labs.   The pertinent results include:   Labs Reviewed  CBC WITH DIFFERENTIAL/PLATELET - Abnormal; Notable for the following components:      Result Value   RBC 2.92 (*)    Hemoglobin 9.5 (*)    HCT 30.5 (*)    MCV 104.5 (*)    All other components within normal limits  COMPREHENSIVE METABOLIC PANEL WITH GFR - Abnormal; Notable for the following components:   Potassium 3.1 (*)    Glucose, Bld 137 (*)    BUN 24 (*)    Creatinine, Ser 1.20 (*)    Calcium 8.8 (*)    GFR, Estimated 45 (*)    All other components within normal limits  LIPASE, BLOOD - Abnormal; Notable for the following components:   Lipase 60 (*)    All other components within normal limits  BRAIN NATRIURETIC  PEPTIDE - Abnormal; Notable for the following components:   B Natriuretic Peptide 430.0 (*)    All other components within normal limits  RETICULOCYTES - Abnormal; Notable for the following components:   Retic Ct Pct 4.3 (*)    RBC. 2.65 (*)    Immature Retic Fract 26.3 (*)    All other components within normal limits  PROTIME-INR  VITAMIN B12  FOLATE  IRON AND TIBC  FERRITIN  TROPONIN I (HIGH SENSITIVITY)  TROPONIN I (HIGH SENSITIVITY)    Notable for as above  EKG   EKG Interpretation Date/Time:    Ventricular Rate:    PR Interval:    QRS Duration:    QT Interval:    QTC Calculation:   R Axis:      Text Interpretation:           Imaging Studies ordered: na   Medicines ordered and prescription drug management: Meds ordered this encounter  Medications    ipratropium-albuterol  (DUONEB) 0.5-2.5 (3) MG/3ML nebulizer solution 3 mL   sodium chloride  0.9 % bolus 1,000 mL   potassium chloride  SA (KLOR-CON  M) CR tablet 40 mEq   pantoprazole (PROTONIX) 40 MG tablet    Sig: Take 1 tablet (40 mg total) by mouth daily for 14 days.    Dispense:  14 tablet    Refill:  0   sucralfate (CARAFATE) 1 g tablet    Sig: Take 1 tablet (1 g total) by mouth with breakfast, with lunch, and with evening meal for 7 days.    Dispense:  21 tablet    Refill:  0    -I have reviewed the patients home medicines and have made adjustments as needed   Consultations Obtained: na   Cardiac Monitoring: The patient was maintained on a cardiac monitor.  I personally viewed and interpreted the cardiac monitored which showed an underlying rhythm of: nsr Continuous pulse oximetry interpreted by myself, 94% on RA.    Social Determinants of Health:  Diagnosis or treatment significantly limited by social determinants of health: former smoker and obesity   Reevaluation: After the interventions noted above, I reevaluated the patient and found that they have improved  Co morbidities that complicate the patient evaluation  Past Medical History:  Diagnosis Date   Anxiety    Arthritis    Basal cell carcinoma    RIGHT THIGH BCC CX3 5FU   Basal cell carcinoma 12/16/2019   right anterior neck (CX35FU)   Depression    Diverticulosis    Essential hypertension    Glaucoma    Hematuria    History of kidney stones    History of pneumonia    Hyperlipidemia    OSA (obstructive sleep apnea) 10/30/2022   PAF (paroxysmal atrial fibrillation) (HCC)    Pneumonia    SCC (squamous cell carcinoma) 2023   left forearm inf. tx cx3 33fu   Squamous cell carcinoma of skin 2023   left forearm sup tx cx3 14fu   Stress incontinence    TIA (transient ischemic attack)    January 2020   Urinary frequency    Varicose veins       Dispostion: Disposition decision including need for  hospitalization was considered, and patient discharged from emergency department.    Final Clinical Impression(s) / ED Diagnoses Final diagnoses:  Anemia, unspecified type  Hypokalemia        Teddi Favors, DO 08/27/23 1204

## 2023-08-27 NOTE — ED Triage Notes (Addendum)
 Pt from home states that she was told to come to the ED due to low Hgb and concern about rectal bleeding. Hasn't taken blood thinner since Friday. Endorses weakness, SOB and Dizziness. States that she has episode sof her eyes rolling back in her head

## 2023-08-27 NOTE — Discharge Instructions (Signed)
 It was a pleasure caring for you today in the emergency department.  Please continue holding your Eliquis  until you follow-up with your PCP.  Please call PCP to arrange follow-up in the next few days for repeat of your hemoglobin.  Your hemoglobin continues to improve and the evidence of your possible bleeding continues to improve he can likely restart your Eliquis .  Please follow-up with gastroenterology within 2 weeks for endoscopic evaluation.  No NSAID's  Please return to the emergency department for any worsening or worrisome symptoms.

## 2023-08-29 DIAGNOSIS — K922 Gastrointestinal hemorrhage, unspecified: Secondary | ICD-10-CM | POA: Diagnosis not present

## 2023-08-29 DIAGNOSIS — D500 Iron deficiency anemia secondary to blood loss (chronic): Secondary | ICD-10-CM | POA: Diagnosis not present

## 2023-08-29 NOTE — Telephone Encounter (Signed)
-----   Message from Cadence Rebekah Canada sent at 08/28/2023 11:10 PM EDT ----- Thank you for your help!  Patient needs referral to GI if she does not have one . ----- Message ----- From: Aubry Blase, LPN Sent: 05/22/7423   4:36 PM EDT To: Cadence Rebekah Canada, PA-C  The patient has been notified of the result and verbalized understanding.  All questions (if any) were answered. Carole Churches, LPN 11/22/6385 5:64 PM   Pt states that she feels fine and does not want to go to the ER today b/c she has plans tomorrow. Pt states that  I will happily go on Monday. Pt encouraged to go to the ER today and she again declined.

## 2023-08-29 NOTE — Telephone Encounter (Signed)
 Referral placed.

## 2023-08-30 ENCOUNTER — Encounter (INDEPENDENT_AMBULATORY_CARE_PROVIDER_SITE_OTHER): Payer: Self-pay | Admitting: *Deleted

## 2023-09-06 DIAGNOSIS — Z79899 Other long term (current) drug therapy: Secondary | ICD-10-CM | POA: Diagnosis not present

## 2023-09-06 DIAGNOSIS — I480 Paroxysmal atrial fibrillation: Secondary | ICD-10-CM | POA: Diagnosis not present

## 2023-09-06 DIAGNOSIS — N1831 Chronic kidney disease, stage 3a: Secondary | ICD-10-CM | POA: Diagnosis not present

## 2023-09-06 DIAGNOSIS — E1129 Type 2 diabetes mellitus with other diabetic kidney complication: Secondary | ICD-10-CM | POA: Diagnosis not present

## 2023-09-11 ENCOUNTER — Other Ambulatory Visit: Payer: Self-pay | Admitting: Cardiology

## 2023-09-12 ENCOUNTER — Encounter (INDEPENDENT_AMBULATORY_CARE_PROVIDER_SITE_OTHER): Payer: Self-pay | Admitting: Gastroenterology

## 2023-09-12 ENCOUNTER — Encounter: Payer: Self-pay | Admitting: *Deleted

## 2023-09-12 ENCOUNTER — Telehealth: Payer: Self-pay | Admitting: *Deleted

## 2023-09-12 ENCOUNTER — Ambulatory Visit (INDEPENDENT_AMBULATORY_CARE_PROVIDER_SITE_OTHER): Admitting: Gastroenterology

## 2023-09-12 VITALS — BP 97/49 | HR 41 | Temp 97.1°F | Ht 63.5 in | Wt 265.1 lb

## 2023-09-12 DIAGNOSIS — K921 Melena: Secondary | ICD-10-CM | POA: Diagnosis not present

## 2023-09-12 DIAGNOSIS — D509 Iron deficiency anemia, unspecified: Secondary | ICD-10-CM

## 2023-09-12 MED ORDER — OMEPRAZOLE 40 MG PO CPDR: 40.0000 mg | DELAYED_RELEASE_CAPSULE | Freq: Two times a day (BID) | ORAL | 1 refills | Status: AC

## 2023-09-12 NOTE — H&P (View-Only) (Signed)
 Toribio Fortune, M.D. Gastroenterology & Hepatology [facility] [address] [place], [place] [address] Primary Care Physician: Sheryle Carwin, MD [address] [place] [address]  Referring MD: Cadence H. Franchester, [place]  Chief Complaint:  dark stools/concern for possible melena.  History of Present Illness: Kim Dixon is a 83 y.o. female with PMH anxiety, arthritis, skin cancer, diverticulosis, depression, hyperlipidemia, OSA, TIA, A-fib, who presents for evaluation of dark stools/concern for possible melena.  Patient reports that around Mother's day 2025 she presented new onset of melena. She reports that she has had some nausea intermittently. The patient denies having any vomiting, fever, chills, hematochezia, hematemesis, abdominal distention, abdominal pain, diarrhea, jaundice, pruritus or weight loss. In June 6th she had her Eliquis  held as advised by Dr. Debera.  Patient went to the ER At [facility] 08/27/2023.  Her PCP had found worsening anemia (value of 8.7) and was advised to come to the hospital.  Had her Eliquis  stopped.  Labs showed potassium of 3.1, rest of electrolytes within normal limits, creatinine 1.20, BUN 24, normal liver function panel, lipase 60, hemoglobin 9.5, platelets were clumped (previous were 193), L BC 4.8, INR 1.0.  Reticulocytes were increased at 4.3%, iron was low at 27, TIBC elevated at 484, ferritin 20 and saturation of 6%. FOBT was positive. She was prescribed Carafate  (already finished course) and was  started on pantoprazole  40 mg twice a day. She reports that when she takes pantoprazole , she will have significant   Notably, her hemoglobin in January 2025 was 13.6.  Patient takes Eliquis  5 mg twice daily for A-fib, which she has taken for multiple years. Has had this changed between 2.5 -5 mg dosing. States melena has subsided after stopping Eliquis  for several days.  Only takes Tylenol   for pain.  Last ZHI:wzczm Last Colonoscopy:06/2013 Examination performed to cecum. Small polyp at ascending colon coagulated using snare tip. 10 mm pedunculated polyp with surface erosion snared from hepatic flexure. -Inflammatory polyp. Pancolonic diverticulosis. Small external hemorrhoids.  Recommend repeat colonoscopy in 10 years  FHx: neg for any gastrointestinal/liver disease, father lung cancer Social: quit smoking 30 years ago or illicit drug use. Drinks 1 glass of wine every night.  Surgical: hysterectomy  Past Medical History: Past Medical History:  Diagnosis Date   Anxiety    Arthritis    Basal cell carcinoma    RIGHT THIGH BCC CX3 5FU   Basal cell carcinoma 12/16/2019   right anterior neck (CX35FU)   Depression    Diverticulosis    Essential hypertension    Glaucoma    Hematuria    History of kidney stones    History of pneumonia    Hyperlipidemia    OSA (obstructive sleep apnea) 10/30/2022   PAF (paroxysmal atrial fibrillation) (HCC)    Pneumonia    SCC (squamous cell carcinoma) 2023   left forearm inf. tx cx3 5fu   Squamous cell carcinoma of skin 2023   left forearm sup tx cx3 19fu   Stress incontinence    TIA (transient ischemic attack)    January 2020   Urinary frequency    Varicose veins     Past Surgical History: Past Surgical History:  Procedure Laterality Date   ABDOMINAL HYSTERECTOMY     ABLATION SAPHENOUS VEIN W/ RFA Bilateral    CATARACT EXTRACTION Bilateral    COLONOSCOPY     COLONOSCOPY N/A 06/18/2013   Procedure: COLONOSCOPY;  Surgeon: Claudis RAYMOND Rivet, MD;  [facility];  Service: Endoscopy;  Laterality: N/A;  830   CYSTOSCOPY WITH RETROGRADE PYELOGRAM, URETEROSCOPY AND STENT PLACEMENT Left 10/18/2018   Procedure: CYSTOSCOPY WITH RETROGRADE PYELOGRAM, URETEROSCOPY AND STENT PLACEMENT;  Surgeon: Alvaro Hummer, MD;  Location: [facility];  Service: Urology;  Laterality: Left;  1 HR   EYE SURGERY     laser surgery bilat    HOLMIUM  LASER APPLICATION Left 10/18/2018   Procedure: HOLMIUM LASER APPLICATION;  Surgeon: Alvaro Hummer, MD;  Location: [facility];  Service: Urology;  Laterality: Left;   LEFT HEART CATH AND CORONARY ANGIOGRAPHY N/A 11/28/2019   Procedure: LEFT HEART CATH AND CORONARY ANGIOGRAPHY;  Surgeon: Darron Deatrice LABOR, MD;  [facility];  Service: Cardiovascular;  Laterality: N/A;   LITHOTRIPSY     MASS EXCISION N/A 12/07/2017   Procedure: EXCISION 3CM CYST ON BACK;  Surgeon: Mavis Anes, MD;  Location: [facility];  Service: General;  Laterality: N/A;   PARATHYROIDECTOMY Left 04/22/2019   Procedure: LEFT INFERIOR PARATHYROIDECTOMY;  Surgeon: Eletha Boas, MD;  [facility]: [facility];  Service: General;  Laterality: Left;   TONSILLECTOMY     TOTAL KNEE ARTHROPLASTY Left 08/25/2014   Procedure: LEFT TOTAL KNEE ARTHROPLASTY;  Surgeon: Donnice Car, MD;  Location: [facility];  Service: Orthopedics;  Laterality: Left;    Family History: Family History  Problem Relation Age of Onset   Coronary artery disease Mother    Lung cancer Father    Colon cancer Neg Hx    Stomach cancer Neg Hx     Social History: Social History   Tobacco Use  Smoking Status Former   Current packs/day: 0.00   Average packs/day: 3.0 packs/day for 30.0 years (90.0 ttl pk-yrs)   Types: Cigarettes   Start date: 04/05/1963   Quit date: 04/04/1993   Years since quitting: 30.4  Smokeless Tobacco Never   Social History   Substance and Sexual Activity  Alcohol Use Yes   Comment: Nightly glass of wine   Social History   Substance and Sexual Activity  Drug Use No    Allergies: Allergies  Allergen Reactions   Naproxen Sodium     Tightness in throat    Medications: Current Outpatient Medications  Medication Sig Dispense Refill   albuterol  (VENTOLIN  HFA) 108 (90 Base) MCG/ACT inhaler Inhale 2 puffs into the lungs every 6 (six) hours as needed for wheezing or shortness of breath. 8 g 2   Ascorbic Acid (VITAMIN C) 1000 MG  tablet Take 3,000 mg by mouth daily.      atorvastatin (LIPITOR) 10 MG tablet Take 10 mg by mouth every Monday, Wednesday, and Friday at 8 PM.      bimatoprost (LUMIGAN) 0.01 % SOLN Place 1 drop into both eyes at bedtime.     Cyanocobalamin  (VITAMIN B-12 SL) Place 1 tablet under the tongue daily.     escitalopram (LEXAPRO) 10 MG tablet Take 10 mg by mouth daily.     fluticasone  furoate-vilanterol (BREO ELLIPTA ) 100-25 MCG/ACT AEPB Inhale 1 puff into the lungs daily. 30 each 5   levETIRAcetam (KEPPRA) 500 MG tablet Take 250 mg by mouth 2 (two) times daily.     losartan  (COZAAR ) 25 MG tablet Take 1 tablet (25 mg total) by mouth daily. 90 tablet 3   Menthol , Topical Analgesic, (BIOFREEZE ROLL-ON EX) Apply 1 application topically 3 (three) times daily as needed (back pain.).     metoprolol  tartrate (LOPRESSOR ) 25 MG tablet Take 1 tablet (25 mg total) by mouth  2 (two) times daily.     omeprazole (PRILOSEC) 40 MG capsule Take 1 capsule (40 mg total) by mouth 2 (two) times daily. 180 capsule 1   sucralfate  (CARAFATE ) 1 g tablet Take 1 tablet (1 g total) by mouth with breakfast, with lunch, and with evening meal for 7 days. 21 tablet 0   timolol (TIMOPTIC) 0.5 % ophthalmic solution Place 1 drop into both eyes daily.     torsemide  (DEMADEX ) 20 MG tablet TAKE THREE TABLETS BY MOUTH ONCE A DAY. (Patient taking differently: Take 40 mg by mouth daily.) 270 tablet 1   Vitamin D3 (VITAMIN D) 25 MCG tablet Take 1,000 Units by mouth daily.     amiodarone  (PACERONE ) 200 MG tablet Take 1 tablet (200 mg total) by mouth 2 (two) times daily for 7 days, THEN 1 tablet (200 mg total) daily. 104 tablet 0   MYRBETRIQ 25 MG TB24 tablet Take 25 mg by mouth daily. (Patient not taking: Reported on 09/12/2023)     potassium chloride  SA (KLOR-CON  M) 20 MEQ tablet TAKE ONE TABLET ( TOTAL) BY MOUTH DAILY 90 tablet 0   Current Facility-Administered Medications  Medication Dose Route Frequency Provider Last Rate Last Admin    sodium chloride  flush (NS) 0.9 % injection 3 mL  3 mL Intravenous Q12H Debera Jayson MATSU, MD        Review of Systems: GENERAL: negative for malaise, night sweats HEENT: No changes in hearing or vision, no nose bleeds or other nasal problems. NECK: Negative for lumps, goiter, pain and significant neck swelling RESPIRATORY: Negative for cough, wheezing CARDIOVASCULAR: Negative for chest pain, leg swelling, palpitations, orthopnea GI: SEE HPI MUSCULOSKELETAL: Negative for joint pain or swelling, back pain, and muscle pain. SKIN: Negative for lesions, rash PSYCH: Negative for sleep disturbance, mood disorder and recent psychosocial stressors. HEMATOLOGY Negative for prolonged bleeding, bruising easily, and swollen nodes. ENDOCRINE: Negative for cold or heat intolerance, polyuria, polydipsia and goiter. NEURO: negative for tremor, gait imbalance, syncope and seizures. The remainder of the review of systems is noncontributory.   Physical Exam: BP (!) 97/49 (BP Location: Left Arm, Patient Position: Sitting, Cuff Size: Large)   Pulse (!) 41   Temp (!) 97.1 F (36.2 C) (Temporal)   Ht 5' 3.5 (1.613 m)   Wt 265 lb 1.6 oz (120.2 kg)   BMI 46.22 kg/m  GENERAL: The patient is AO x3, in no acute distress. HEENT: Head is normocephalic and atraumatic. EOMI are intact. Mouth is well hydrated and without lesions. NECK: Supple. No masses LUNGS: Clear to auscultation. No presence of rhonchi/wheezing/rales. Adequate chest expansion HEART: RRR, normal s1 and s2. ABDOMEN: Soft, nontender, no guarding, no peritoneal signs, and nondistended. BS +. No masses. EXTREMITIES: Without any cyanosis, clubbing, rash, lesions or edema. NEUROLOGIC: AOx3, no focal motor deficit. SKIN: no jaundice, no rashes   Imaging/Labs: as above  I personally reviewed and interpreted the available labs, imaging and endoscopic files.  Impression and Plan: Kim Dixon is a 83 y.o. female with PMH anxiety,  arthritis, skin cancer, diverticulosis, depression, hyperlipidemia, OSA, TIA, A-fib, who presents for evaluation of dark stools/concern for possible melena.  Patient with recent new onset of episodes of melena in the setting of anticoagulation.  These led to significant iron deficiency anemia and based on most recent blood workup but has not required blood transfusion.  Importantly, she has not presented any more bleeding since she discontinued her anticoagulation, which is reassuring.  I discussed with the patient and his  daughter the need to evaluate her upper gastrointestinal system with an EGD to evaluate an etiology for her upper GI bleeding symptoms, which they are in agreement to proceed with.  If this fails to show a source of bleeding, we will need to proceed with a colonoscopy.  She will need to continue holding her Eliquis  for now.  Notably, she has presence of intolerance to pantoprazole , will switch to omeprazole twice daily for now.  -Schedule EGD  -Stop pantoprazole  and start omeprazole 4 mg twice a day -Continue holding Eliquis  for now  All questions were answered.      Toribio Fortune, MD Gastroenterology and Hepatology [facility]

## 2023-09-12 NOTE — Telephone Encounter (Signed)
 PA approved via cohere Authorization #[id number] DOS:  09/19/2023 - 10/20/2023

## 2023-09-12 NOTE — Patient Instructions (Signed)
 Schedule EGD  Stop pantoprazole  and start omeprazole 4 mg twice a day Continue holding Eliquis  for now

## 2023-09-12 NOTE — H&P (View-Only) (Signed)
 Toribio Fortune, M.D. Gastroenterology & Hepatology [facility] [address] [place], [place] [address] Primary Care Physician: Sheryle Carwin, MD [address] [place] [address]  Referring MD: Cadence H. Franchester, [place]  Chief Complaint:  dark stools/concern for possible melena.  History of Present Illness: Kim Dixon is a 83 y.o. female with PMH anxiety, arthritis, skin cancer, diverticulosis, depression, hyperlipidemia, OSA, TIA, A-fib, who presents for evaluation of dark stools/concern for possible melena.  Patient reports that around Mother's day 2025 she presented new onset of melena. She reports that she has had some nausea intermittently. The patient denies having any vomiting, fever, chills, hematochezia, hematemesis, abdominal distention, abdominal pain, diarrhea, jaundice, pruritus or weight loss. In June 6th she had her Eliquis  held as advised by Dr. Debera.  Patient went to the ER At [facility] 08/27/2023.  Her PCP had found worsening anemia (value of 8.7) and was advised to come to the hospital.  Had her Eliquis  stopped.  Labs showed potassium of 3.1, rest of electrolytes within normal limits, creatinine 1.20, BUN 24, normal liver function panel, lipase 60, hemoglobin 9.5, platelets were clumped (previous were 193), L BC 4.8, INR 1.0.  Reticulocytes were increased at 4.3%, iron was low at 27, TIBC elevated at 484, ferritin 20 and saturation of 6%. FOBT was positive. She was prescribed Carafate  (already finished course) and was  started on pantoprazole  40 mg twice a day. She reports that when she takes pantoprazole , she will have significant   Notably, her hemoglobin in January 2025 was 13.6.  Patient takes Eliquis  5 mg twice daily for A-fib, which she has taken for multiple years. Has had this changed between 2.5 -5 mg dosing. States melena has subsided after stopping Eliquis  for several days.  Only takes Tylenol   for pain.  Last ZHI:wzczm Last Colonoscopy:06/2013 Examination performed to cecum. Small polyp at ascending colon coagulated using snare tip. 10 mm pedunculated polyp with surface erosion snared from hepatic flexure. -Inflammatory polyp. Pancolonic diverticulosis. Small external hemorrhoids.  Recommend repeat colonoscopy in 10 years  FHx: neg for any gastrointestinal/liver disease, father lung cancer Social: quit smoking 30 years ago or illicit drug use. Drinks 1 glass of wine every night.  Surgical: hysterectomy  Past Medical History: Past Medical History:  Diagnosis Date   Anxiety    Arthritis    Basal cell carcinoma    RIGHT THIGH BCC CX3 5FU   Basal cell carcinoma 12/16/2019   right anterior neck (CX35FU)   Depression    Diverticulosis    Essential hypertension    Glaucoma    Hematuria    History of kidney stones    History of pneumonia    Hyperlipidemia    OSA (obstructive sleep apnea) 10/30/2022   PAF (paroxysmal atrial fibrillation) (HCC)    Pneumonia    SCC (squamous cell carcinoma) 2023   left forearm inf. tx cx3 5fu   Squamous cell carcinoma of skin 2023   left forearm sup tx cx3 19fu   Stress incontinence    TIA (transient ischemic attack)    January 2020   Urinary frequency    Varicose veins     Past Surgical History: Past Surgical History:  Procedure Laterality Date   ABDOMINAL HYSTERECTOMY     ABLATION SAPHENOUS VEIN W/ RFA Bilateral    CATARACT EXTRACTION Bilateral    COLONOSCOPY     COLONOSCOPY N/A 06/18/2013   Procedure: COLONOSCOPY;  Surgeon: Claudis RAYMOND Rivet, MD;  [facility];  Service: Endoscopy;  Laterality: N/A;  830   CYSTOSCOPY WITH RETROGRADE PYELOGRAM, URETEROSCOPY AND STENT PLACEMENT Left 10/18/2018   Procedure: CYSTOSCOPY WITH RETROGRADE PYELOGRAM, URETEROSCOPY AND STENT PLACEMENT;  Surgeon: Alvaro Hummer, MD;  Location: [facility];  Service: Urology;  Laterality: Left;  1 HR   EYE SURGERY     laser surgery bilat    HOLMIUM  LASER APPLICATION Left 10/18/2018   Procedure: HOLMIUM LASER APPLICATION;  Surgeon: Alvaro Hummer, MD;  Location: [facility];  Service: Urology;  Laterality: Left;   LEFT HEART CATH AND CORONARY ANGIOGRAPHY N/A 11/28/2019   Procedure: LEFT HEART CATH AND CORONARY ANGIOGRAPHY;  Surgeon: Darron Deatrice LABOR, MD;  [facility];  Service: Cardiovascular;  Laterality: N/A;   LITHOTRIPSY     MASS EXCISION N/A 12/07/2017   Procedure: EXCISION 3CM CYST ON BACK;  Surgeon: Mavis Anes, MD;  Location: [facility];  Service: General;  Laterality: N/A;   PARATHYROIDECTOMY Left 04/22/2019   Procedure: LEFT INFERIOR PARATHYROIDECTOMY;  Surgeon: Eletha Boas, MD;  Location: [facility];  Service: General;  Laterality: Left;   TONSILLECTOMY     TOTAL KNEE ARTHROPLASTY Left 08/25/2014   Procedure: LEFT TOTAL KNEE ARTHROPLASTY;  Surgeon: Donnice Car, MD;  [facility];  Service: Orthopedics;  Laterality: Left;    Family History: Family History  Problem Relation Age of Onset   Coronary artery disease Mother    Lung cancer Father    Colon cancer Neg Hx    Stomach cancer Neg Hx     Social History: Social History   Tobacco Use  Smoking Status Former   Current packs/day: 0.00   Average packs/day: 3.0 packs/day for 30.0 years (90.0 ttl pk-yrs)   Types: Cigarettes   Start date: 04/05/1963   Quit date: 04/04/1993   Years since quitting: 30.4  Smokeless Tobacco Never   Social History   Substance and Sexual Activity  Alcohol Use Yes   Comment: Nightly glass of wine   Social History   Substance and Sexual Activity  Drug Use No    Allergies: Allergies  Allergen Reactions   Naproxen Sodium     Tightness in throat    Medications: Current Outpatient Medications  Medication Sig Dispense Refill   albuterol  (VENTOLIN  HFA) 108 (90 Base) MCG/ACT inhaler Inhale 2 puffs into the lungs every 6 (six) hours as needed for wheezing or shortness of breath. 8 g 2   Ascorbic Acid (VITAMIN C) 1000 MG  tablet Take 3,000 mg by mouth daily.      atorvastatin (LIPITOR) 10 MG tablet Take 10 mg by mouth every Monday, Wednesday, and Friday at 8 PM.      bimatoprost (LUMIGAN) 0.01 % SOLN Place 1 drop into both eyes at bedtime.     Cyanocobalamin  (VITAMIN B-12 SL) Place 1 tablet under the tongue daily.     escitalopram (LEXAPRO) 10 MG tablet Take 10 mg by mouth daily.     fluticasone  furoate-vilanterol (BREO ELLIPTA ) 100-25 MCG/ACT AEPB Inhale 1 puff into the lungs daily. 30 each 5   levETIRAcetam (KEPPRA) 500 MG tablet Take 250 mg by mouth 2 (two) times daily.     losartan  (COZAAR ) 25 MG tablet Take 1 tablet (25 mg total) by mouth daily. 90 tablet 3   Menthol , Topical Analgesic, (BIOFREEZE ROLL-ON EX) Apply 1 application topically 3 (three) times daily as needed (back pain.).     metoprolol  tartrate (LOPRESSOR ) 25 MG tablet Take 1 tablet (25 mg total) by mouth  2 (two) times daily.     omeprazole (PRILOSEC) 40 MG capsule Take 1 capsule (40 mg total) by mouth 2 (two) times daily. 180 capsule 1   sucralfate  (CARAFATE ) 1 g tablet Take 1 tablet (1 g total) by mouth with breakfast, with lunch, and with evening meal for 7 days. 21 tablet 0   timolol (TIMOPTIC) 0.5 % ophthalmic solution Place 1 drop into both eyes daily.     torsemide  (DEMADEX ) 20 MG tablet TAKE THREE TABLETS BY MOUTH ONCE A DAY. (Patient taking differently: Take 40 mg by mouth daily.) 270 tablet 1   Vitamin D3 (VITAMIN D) 25 MCG tablet Take 1,000 Units by mouth daily.     amiodarone  (PACERONE ) 200 MG tablet Take 1 tablet (200 mg total) by mouth 2 (two) times daily for 7 days, THEN 1 tablet (200 mg total) daily. 104 tablet 0   MYRBETRIQ 25 MG TB24 tablet Take 25 mg by mouth daily. (Patient not taking: Reported on 09/12/2023)     potassium chloride  SA (KLOR-CON  M) 20 MEQ tablet TAKE ONE TABLET ( TOTAL) BY MOUTH DAILY 90 tablet 0   Current Facility-Administered Medications  Medication Dose Route Frequency Provider Last Rate Last Admin    sodium chloride  flush (NS) 0.9 % injection 3 mL  3 mL Intravenous Q12H Debera Jayson MATSU, MD        Review of Systems: GENERAL: negative for malaise, night sweats HEENT: No changes in hearing or vision, no nose bleeds or other nasal problems. NECK: Negative for lumps, goiter, pain and significant neck swelling RESPIRATORY: Negative for cough, wheezing CARDIOVASCULAR: Negative for chest pain, leg swelling, palpitations, orthopnea GI: SEE HPI MUSCULOSKELETAL: Negative for joint pain or swelling, back pain, and muscle pain. SKIN: Negative for lesions, rash PSYCH: Negative for sleep disturbance, mood disorder and recent psychosocial stressors. HEMATOLOGY Negative for prolonged bleeding, bruising easily, and swollen nodes. ENDOCRINE: Negative for cold or heat intolerance, polyuria, polydipsia and goiter. NEURO: negative for tremor, gait imbalance, syncope and seizures. The remainder of the review of systems is noncontributory.   Physical Exam: BP (!) 97/49 (BP Location: Left Arm, Patient Position: Sitting, Cuff Size: Large)   Pulse (!) 41   Temp (!) 97.1 F (36.2 C) (Temporal)   Ht 5' 3.5 (1.613 m)   Wt 265 lb 1.6 oz (120.2 kg)   BMI 46.22 kg/m  GENERAL: The patient is AO x3, in no acute distress. HEENT: Head is normocephalic and atraumatic. EOMI are intact. Mouth is well hydrated and without lesions. NECK: Supple. No masses LUNGS: Clear to auscultation. No presence of rhonchi/wheezing/rales. Adequate chest expansion HEART: RRR, normal s1 and s2. ABDOMEN: Soft, nontender, no guarding, no peritoneal signs, and nondistended. BS +. No masses. EXTREMITIES: Without any cyanosis, clubbing, rash, lesions or edema. NEUROLOGIC: AOx3, no focal motor deficit. SKIN: no jaundice, no rashes   Imaging/Labs: as above  I personally reviewed and interpreted the available labs, imaging and endoscopic files.  Impression and Plan: JINX GILDEN is a 83 y.o. female with PMH anxiety,  arthritis, skin cancer, diverticulosis, depression, hyperlipidemia, OSA, TIA, A-fib, who presents for evaluation of dark stools/concern for possible melena.  Patient with recent new onset of episodes of melena in the setting of anticoagulation.  These led to significant iron deficiency anemia and based on most recent blood workup but has not required blood transfusion.  Importantly, she has not presented any more bleeding since she discontinued her anticoagulation, which is reassuring.  I discussed with the patient and his  daughter the need to evaluate her upper gastrointestinal system with an EGD to evaluate an etiology for her upper GI bleeding symptoms, which they are in agreement to proceed with.  If this fails to show a source of bleeding, we will need to proceed with a colonoscopy.  She will need to continue holding her Eliquis  for now.  Notably, she has presence of intolerance to pantoprazole , will switch to omeprazole twice daily for now.  -Schedule EGD  -Stop pantoprazole  and start omeprazole 4 mg twice a day -Continue holding Eliquis  for now  All questions were answered.      Toribio Fortune, MD Gastroenterology and Hepatology [facility]

## 2023-09-12 NOTE — Progress Notes (Signed)
 Kim Dixon, M.D. Gastroenterology & Hepatology [facility] Gastroenterology [address] [place], [place] [address] Primary Care Physician: Sheryle Carwin, MD [address] [place] [address]  Referring MD: Cadence H. Franc[place]ster, [place]  Chief Complaint:  dark stools/concern for possible melena.  History of Present Illness: Kim Dixon is a 83 y.o. female with PMH anxiety, arthritis, skin cancer, diverticulosis, depression, hyperlipidemia, OSA, TIA, A-fib, who presents for evaluation of dark stools/concern for possible melena.  Patient reports that around Mother's day 2025 she presented new onset of melena. She reports that she has had some nausea intermittently. The patient denies having any vomiting, fever, chills, hematochezia, hematemesis, abdominal distention, abdominal pain, diarrhea, jaundice, pruritus or weight loss. In June 6th she had her Eliquis  held as advised by Dr. Debera.  Patient went to the ER At [facility] 08/27/2023.  Her PCP had found worsening anemia (value of 8.7) and was advised to come to the hospital.  Had her Eliquis  stopped.  Labs showed potassium of 3.1, rest of electrolytes within normal limits, creatinine 1.20, BUN 24, normal liver function panel, lipase 60, hemoglobin 9.5, platelets were clumped (previous were 193), L BC 4.8, INR 1.0.  Reticulocytes were increased at 4.3%, iron was low at 27, TIBC elevated at 484, ferritin 20 and saturation of 6%. FOBT was positive. She was prescribed Carafate  (already finished course) and was  started on pantoprazole  40 mg twice a day. She reports that when she takes pantoprazole , she will have significant   Notably, her hemoglobin in January 2025 was 13.6.  Patient takes Eliquis  5 mg twice daily for A-fib, which she has taken for multiple years. Has had this changed between 2.5 -5 mg dosing. States melena has subsided after stopping Eliquis  for several days.  Only takes Tylenol   for pain.  Last ZHI:wzczm Last Colonoscopy:06/2013 Examination performed to cecum. Small polyp at ascending colon coagulated using snare tip. 10 mm pedunculated polyp with surface erosion snared from hepatic flexure. -Inflammatory polyp. Pancolonic diverticulosis. Small external hemorrhoids.  Recommend repeat colonoscopy in 10 years  FHx: neg for any gastrointestinal/liver disease, father lung cancer Social: quit smoking 30 years ago or illicit drug use. Drinks 1 glass of wine every night.  Surgical: hysterectomy  Past Medical History: Past Medical History:  Diagnosis Date   Anxiety    Arthritis    Basal cell carcinoma    RIGHT THIGH BCC CX3 5FU   Basal cell carcinoma 12/16/2019   right anterior neck (CX35FU)   Depression    Diverticulosis    Essential hypertension    Glaucoma    Hematuria    History of kidney stones    History of pneumonia    Hyperlipidemia    OSA (obstructive sleep apnea) 10/30/2022   PAF (paroxysmal atrial fibrillation) (HCC)    Pneumonia    SCC (squamous cell carcinoma) 2023   left forearm inf. tx cx3 5fu   Squamous cell carcinoma of skin 2023   left forearm sup tx cx3 19fu   Stress incontinence    TIA (transient ischemic attack)    January 2020   Urinary frequency    Varicose veins     Past Surgical History: Past Surgical History:  Procedure Laterality Date   ABDOMINAL HYSTERECTOMY     ABLATION SAPHENOUS VEIN W/ RFA Bilateral    CATARACT EXTRACTION Bilateral    COLONOSCOPY     COLONOSCOPY N/A 06/18/2013   Procedure: COLONOSCOPY;  Surgeon: Claudis RAYMOND Rivet, MD;  [facility]  [facility];  Service: Endoscopy;  Laterality: N/A;  830   CYSTOSCOPY WITH RETROGRADE PYELOGRAM, URETEROSCOPY AND STENT PLACEMENT Left 10/18/2018   Procedure: CYSTOSCOPY WITH RETROGRADE PYELOGRAM, URETEROSCOPY AND STENT PLACEMENT;  Surgeon: Alvaro Hummer, MD;  Location: [facility] ORS;  Service: Urology;  Laterality: Left;  1 HR   EYE SURGERY     laser surgery bilat    HOLMIUM  LASER APPLICATION Left 10/18/2018   Procedure: HOLMIUM LASER APPLICATION;  Surgeon: Alvaro Hummer, MD;  Location: [facility] OR[facility];  Service: Urology;  Laterality: Left;   LEFT HEART CATH AND CORONARY ANGIOGRAPHY N/A 11/28/2019   Procedure: LEFT HEART CATH AND CORONARY ANGIOGRAPHY;  Surgeon: Darron Deatrice LABOR, MD;  Location: [facility];  Service: Cardiovascular;  Laterality: N/A;   LITHOTRIPSY     MASS EXCISION N/A 12/07/2017   Procedure: EXCISION 3CM CYST ON BACK;  Surgeon: Mavis Anes, MD;  Location: [facility] ORS;  Service: General;  Laterality: N/A;   PARATHYROIDECTOMY Left 04/22/2019   Procedure: LEFT INFERIOR PARATHYROIDECTOMY;  Surgeon: Eletha Boas, MD;  Location: [facility];  Service: General;  Laterality: Left;   TONSILLECTOMY     TOTAL KNEE ARTHROPLASTY Left 08/25/2014   Procedure: LEFT TOTAL KNEE ARTHROPLASTY;  Surgeon: Donnice Car, MD;  Location: [facility];  Service: Orthopedics;  Laterality: Left;    Family History: Family History  Problem Relation Age of Onset   Coronary artery disease Mother    Lung cancer Father    Colon cancer Neg Hx    Stomach cancer Neg Hx     Social History: Social History   Tobacco Use  Smoking Status Former   Current packs/day: 0.00   Average packs/day: 3.0 packs/day for 30.0 years (90.0 ttl pk-yrs)   Types: Cigarettes   Start date: 04/05/1963   Quit date: 04/04/1993   Years since quitting: 30.4  Smokeless Tobacco Never   Social History   Substance and Sexual Activity  Alcohol Use Yes   Comment: Nightly glass of wine   Social History   Substance and Sexual Activity  Drug Use No    Allergies: Allergies  Allergen Reactions   Naproxen Sodium     Tightness in throat    Medications: Current Outpatient Medications  Medication Sig Dispense Refill   albuterol  (VENTOLIN  HFA) 108 (90 Base) MCG/ACT inhaler Inhale 2 puffs into the lungs every 6 (six) hours as needed for wheezing or shortness of breath. 8 g 2   Ascorbic Acid (VITAMIN C) 1000 MG  tablet Take 3,000 mg by mouth daily.      atorvastatin (LIPITOR) 10 MG tablet Take 10 mg by mouth every Monday, Wednesday, and Friday at 8 PM.      bimatoprost (LUMIGAN) 0.01 % SOLN Place 1 drop into both eyes at bedtime.     Cyanocobalamin  (VITAMIN B-12 SL) Place 1 tablet under the tongue daily.     escitalopram (LEXAPRO) 10 MG tablet Take 10 mg by mouth daily.     fluticasone  furoate-vilanterol (BREO ELLIPTA ) 100-25 MCG/ACT AEPB Inhale 1 puff into the lungs daily. 30 each 5   levETIRAcetam (KEPPRA) 500 MG tablet Take 250 mg by mouth 2 (two) times daily.     losartan  (COZAAR ) 25 MG tablet Take 1 tablet (25 mg total) by mouth daily. 90 tablet 3   Menthol , Topical Analgesic, (BIOFREEZE ROLL-ON EX) Apply 1 application topically 3 (three) times daily as needed (back pain.).     metoprolol  tartrate (LOPRESSOR ) 25 MG tablet Take 1 tablet (25 mg total) by mouth  2 (two) times daily.     omeprazole (PRILOSEC) 40 MG capsule Take 1 capsule (40 mg total) by mouth 2 (two) times daily. 180 capsule 1   sucralfate  (CARAFATE ) 1 g tablet Take 1 tablet (1 g total) by mouth with breakfast, with lunch, and with evening meal for 7 days. 21 tablet 0   timolol (TIMOPTIC) 0.5 % ophthalmic solution Place 1 drop into both eyes daily.     torsemide  (DEMADEX ) 20 MG tablet TAKE THREE TABLETS BY MOUTH ONCE A DAY. (Patient taking differently: Take 40 mg by mouth daily.) 270 tablet 1   Vitamin D3 (VITAMIN D) 25 MCG tablet Take 1,000 Units by mouth daily.     amiodarone  (PACERONE ) 200 MG tablet Take 1 tablet (200 mg total) by mouth 2 (two) times daily for 7 days, THEN 1 tablet (200 mg total) daily. 104 tablet 0   MYRBETRIQ 25 MG TB24 tablet Take 25 mg by mouth daily. (Patient not taking: Reported on 09/12/2023)     potassium chloride  SA (KLOR-CON  M) 20 MEQ tablet TAKE ONE TABLET ( TOTAL) BY MOUTH DAILY 90 tablet 0   Current Facility-Administered Medications  Medication Dose Route Frequency Provider Last Rate Last Admin    sodium chloride  flush (NS) 0.9 % injection 3 mL  3 mL Intravenous Q12H Debera Jayson MATSU, MD        Review of Systems: GENERAL: negative for malaise, night sweats HEENT: No changes in hearing or vision, no nose bleeds or other nasal problems. NECK: Negative for lumps, goiter, pain and significant neck swelling RESPIRATORY: Negative for cough, wheezing CARDIOVASCULAR: Negative for chest pain, leg swelling, palpitations, orthopnea GI: SEE HPI MUSCULOSKELETAL: Negative for joint pain or swelling, back pain, and muscle pain. SKIN: Negative for lesions, rash PSYCH: Negative for sleep disturbance, mood disorder and recent psychosocial stressors. HEMATOLOGY Negative for prolonged bleeding, bruising easily, and swollen nodes. ENDOCRINE: Negative for cold or heat intolerance, polyuria, polydipsia and goiter. NEURO: negative for tremor, gait imbalance, syncope and seizures. The remainder of the review of systems is noncontributory.   Physical Exam: BP (!) 97/49 (BP Location: Left Arm, Patient Position: Sitting, Cuff Size: Large)   Pulse (!) 41   Temp (!) 97.1 F (36.2 C) (Temporal)   Ht 5' 3.5 (1.613 m)   Wt 265 lb 1.6 oz (120.2 kg)   BMI 46.22 kg/m  GENERAL: The patient is AO x3, in no acute distress. HEENT: Head is normocephalic and atraumatic. EOMI are intact. Mouth is well hydrated and without lesions. NECK: Supple. No masses LUNGS: Clear to auscultation. No presence of rhonchi/wheezing/rales. Adequate chest expansion HEART: RRR, normal s1 and s2. ABDOMEN: Soft, nontender, no guarding, no peritoneal signs, and nondistended. BS +. No masses. EXTREMITIES: Without any cyanosis, clubbing, rash, lesions or edema. NEUROLOGIC: AOx3, no focal motor deficit. SKIN: no jaundice, no rashes   Imaging/Labs: as above  I personally reviewed and interpreted the available labs, imaging and endoscopic files.  Impression and Plan: JINX GILDEN is a 83 y.o. female with PMH anxiety,  arthritis, skin cancer, diverticulosis, depression, hyperlipidemia, OSA, TIA, A-fib, who presents for evaluation of dark stools/concern for possible melena.  Patient with recent new onset of episodes of melena in the setting of anticoagulation.  These led to significant iron deficiency anemia and based on most recent blood workup but has not required blood transfusion.  Importantly, she has not presented any more bleeding since she discontinued her anticoagulation, which is reassuring.  I discussed with the patient and his  daughter the need to evaluate her upper gastrointestinal system with an EGD to evaluate an etiology for her upper GI bleeding symptoms, which they are in agreement to proceed with.  If this fails to show a source of bleeding, we will need to proceed with a colonoscopy.  She will need to continue holding her Eliquis  for now.  Notably, she has presence of intolerance to pantoprazole , will switch to omeprazole twice daily for now.  -Schedule EGD  -Stop pantoprazole  and start omeprazole 4 mg twice a day -Continue holding Eliquis  for now  All questions were answered.      Kim Fortune, MD Gastroenterology and Hepatology [facility]

## 2023-09-13 DIAGNOSIS — K922 Gastrointestinal hemorrhage, unspecified: Secondary | ICD-10-CM | POA: Diagnosis not present

## 2023-09-13 DIAGNOSIS — N183 Chronic kidney disease, stage 3 unspecified: Secondary | ICD-10-CM | POA: Diagnosis not present

## 2023-09-13 DIAGNOSIS — E1129 Type 2 diabetes mellitus with other diabetic kidney complication: Secondary | ICD-10-CM | POA: Diagnosis not present

## 2023-09-19 ENCOUNTER — Encounter (HOSPITAL_COMMUNITY)
Admission: RE | Disposition: A | Discharge status: Home / Self Care | Payer: Self-pay | Source: Home / Self Care | Attending: Gastroenterology

## 2023-09-19 ENCOUNTER — Ambulatory Visit (HOSPITAL_COMMUNITY): Admitting: Certified Registered"

## 2023-09-19 ENCOUNTER — Telehealth: Payer: Self-pay | Admitting: *Deleted

## 2023-09-19 ENCOUNTER — Other Ambulatory Visit: Payer: Self-pay

## 2023-09-19 ENCOUNTER — Encounter: Payer: Self-pay | Admitting: *Deleted

## 2023-09-19 ENCOUNTER — Encounter (HOSPITAL_COMMUNITY): Payer: Self-pay | Admitting: Gastroenterology

## 2023-09-19 ENCOUNTER — Other Ambulatory Visit: Payer: Self-pay | Admitting: *Deleted

## 2023-09-19 ENCOUNTER — Ambulatory Visit (HOSPITAL_COMMUNITY)
Admission: RE | Admit: 2023-09-19 | Discharge: 2023-09-19 | Disposition: A | Discharge status: Home / Self Care | Attending: Gastroenterology | Admitting: Gastroenterology

## 2023-09-19 DIAGNOSIS — K921 Melena: Secondary | ICD-10-CM | POA: Diagnosis not present

## 2023-09-19 DIAGNOSIS — K31A19 Gastric intestinal metaplasia without dysplasia, unspecified site: Secondary | ICD-10-CM | POA: Insufficient documentation

## 2023-09-19 DIAGNOSIS — K31A11 Gastric intestinal metaplasia without dysplasia, involving the antrum: Secondary | ICD-10-CM | POA: Diagnosis not present

## 2023-09-19 DIAGNOSIS — I480 Paroxysmal atrial fibrillation: Secondary | ICD-10-CM | POA: Diagnosis not present

## 2023-09-19 DIAGNOSIS — Z87891 Personal history of nicotine dependence: Secondary | ICD-10-CM

## 2023-09-19 DIAGNOSIS — Z7901 Long term (current) use of anticoagulants: Secondary | ICD-10-CM | POA: Diagnosis not present

## 2023-09-19 DIAGNOSIS — K2950 Unspecified chronic gastritis without bleeding: Secondary | ICD-10-CM | POA: Diagnosis not present

## 2023-09-19 DIAGNOSIS — G4733 Obstructive sleep apnea (adult) (pediatric): Secondary | ICD-10-CM | POA: Diagnosis not present

## 2023-09-19 DIAGNOSIS — I10 Essential (primary) hypertension: Secondary | ICD-10-CM

## 2023-09-19 DIAGNOSIS — K2951 Unspecified chronic gastritis with bleeding: Secondary | ICD-10-CM | POA: Diagnosis not present

## 2023-09-19 DIAGNOSIS — K31A0 Gastric intestinal metaplasia, unspecified: Secondary | ICD-10-CM

## 2023-09-19 DIAGNOSIS — G4730 Sleep apnea, unspecified: Secondary | ICD-10-CM | POA: Diagnosis not present

## 2023-09-19 DIAGNOSIS — K3189 Other diseases of stomach and duodenum: Secondary | ICD-10-CM | POA: Insufficient documentation

## 2023-09-19 HISTORY — PX: ESOPHAGOGASTRODUODENOSCOPY: SHX5428

## 2023-09-19 SURGERY — EGD (ESOPHAGOGASTRODUODENOSCOPY)
Anesthesia: General

## 2023-09-19 MED ORDER — LACTATED RINGERS IV SOLN: INTRAVENOUS | Status: DC | PRN

## 2023-09-19 MED ORDER — IPRATROPIUM-ALBUTEROL 0.5-2.5 (3) MG/3ML IN SOLN: 3.0000 mL | Freq: Once | RESPIRATORY_TRACT | Status: DC

## 2023-09-19 MED ORDER — NA SULFATE-K SULFATE-MG SULF 17.5-3.13-1.6 GM/177ML PO SOLN: ORAL | 0 refills | Status: DC

## 2023-09-19 MED ORDER — LACTATED RINGERS IV SOLN: INTRAVENOUS | Status: DC

## 2023-09-19 MED ORDER — STERILE WATER FOR IRRIGATION IR SOLN
Status: DC | PRN
  Administered 2023-09-19: 60 mL

## 2023-09-19 MED ORDER — LIDOCAINE 2% (20 MG/ML) 5 ML SYRINGE
INTRAMUSCULAR | Status: DC | PRN
  Administered 2023-09-19: 80 mg via INTRAVENOUS

## 2023-09-19 MED ORDER — IPRATROPIUM-ALBUTEROL 0.5-2.5 (3) MG/3ML IN SOLN
RESPIRATORY_TRACT | Status: AC
Start: 2023-09-19 — End: 2023-09-19
  Filled 2023-09-19: qty 3

## 2023-09-19 MED ORDER — IPRATROPIUM-ALBUTEROL 0.5-2.5 (3) MG/3ML IN SOLN
3.0000 mL | Freq: Once | RESPIRATORY_TRACT | Status: AC
  Administered 2023-09-19: 3 mL via RESPIRATORY_TRACT

## 2023-09-19 MED ORDER — PROPOFOL 10 MG/ML IV BOLUS
INTRAVENOUS | Status: DC | PRN
  Administered 2023-09-19: 50 mg via INTRAVENOUS
  Administered 2023-09-19: 30 mg via INTRAVENOUS
  Administered 2023-09-19: 10 mg via INTRAVENOUS

## 2023-09-19 NOTE — Interval H&P Note (Signed)
 History and Physical Interval Note:  09/19/2023 7:40 AM  Kim Dixon  has presented today for surgery, with the diagnosis of melena.  The various methods of treatment have been discussed with the patient and family. After consideration of risks, benefits and other options for treatment, the patient has consented to  Procedure(s) with comments: EGD (ESOPHAGOGASTRODUODENOSCOPY) (N/A) - 730am, asa 1-2 as a surgical intervention.  The patient's history has been reviewed, patient examined, no change in status, stable for surgery.  I have reviewed the patient's chart and labs.  Questions were answered to the patient's satisfaction.     Julieth Tugman Castaneda Mayorga

## 2023-09-19 NOTE — Discharge Instructions (Addendum)
 You are being discharged to home.  Resume your previous diet.  We are waiting for your pathology results.  Schedule colonoscopy in next available. Discuss with cardiologist and pulmonologist low saturation.

## 2023-09-19 NOTE — Anesthesia Preprocedure Evaluation (Addendum)
 Anesthesia Evaluation  Patient identified by MRN, date of birth, ID band Patient awake    Reviewed: Allergy & Precautions, H&P , NPO status , Patient's Chart, lab work & pertinent test results  Airway Mallampati: III  TM Distance: >3 FB Neck ROM: Full    Dental no notable dental hx.    Pulmonary shortness of breath, sleep apnea , pneumonia, former smoker Sats low on arrival Put on 2 liters oxygen   breath sounds clear to auscultation + wheezing      Cardiovascular hypertension, + angina  Normal cardiovascular exam Rhythm:Regular Rate:Normal     Neuro/Psych  PSYCHIATRIC DISORDERS Anxiety Depression    TIA   GI/Hepatic negative GI ROS, Neg liver ROS,,,  Endo/Other  negative endocrine ROS    Renal/GU negative Renal ROS  negative genitourinary   Musculoskeletal  (+) Arthritis ,    Abdominal   Peds negative pediatric ROS (+)  Hematology negative hematology ROS (+)   Anesthesia Other Findings   Reproductive/Obstetrics negative OB ROS                              Anesthesia Physical Anesthesia Plan  ASA: 4  Anesthesia Plan: General   Post-op Pain Management:    Induction: Intravenous  PONV Risk Score and Plan: Propofol  infusion  Airway Management Planned: Nasal Cannula  Additional Equipment:   Intra-op Plan:   Post-operative Plan:   Informed Consent: I have reviewed the patients History and Physical, chart, labs and discussed the procedure including the risks, benefits and alternatives for the proposed anesthesia with the patient or authorized representative who has indicated his/her understanding and acceptance.     Dental advisory given  Plan Discussed with: CRNA  Anesthesia Plan Comments:          Anesthesia Quick Evaluation

## 2023-09-19 NOTE — Progress Notes (Signed)
 Post op O2 sat ranging 90-96% on RA.  Dr Eartha to send referral to a pulmonary. Dr. Herschell in to assess pt, discussed importance of pt using cpap and following up with primary within the next week.  Pt and daughter verbalized understanding.  Okay to discharge per Dr. Herschell and Dr. Eartha.

## 2023-09-19 NOTE — Anesthesia Postprocedure Evaluation (Signed)
 Anesthesia Post Note  Patient: Kim Dixon  Procedure(s) Performed: EGD (ESOPHAGOGASTRODUODENOSCOPY)  Patient location during evaluation: PACU Anesthesia Type: General Level of consciousness: awake and alert Pain management: pain level controlled Vital Signs Assessment: post-procedure vital signs reviewed and stable Respiratory status: spontaneous breathing, nonlabored ventilation, respiratory function stable and patient connected to nasal cannula oxygen Cardiovascular status: stable and blood pressure returned to baseline Postop Assessment: no apparent nausea or vomiting Anesthetic complications: no Comments: Discussed need for patient to use CPAP (at home) and for home 02 eval per pulmonologist   No notable events documented.   Last Vitals:  Vitals:   09/19/23 0836 09/19/23 0839  BP:    Pulse:    Resp:    Temp:    SpO2: 92% 96%    Last Pain:  Vitals:   09/19/23 0823  TempSrc: Axillary  PainSc: 0-No pain                 Andrea Limes

## 2023-09-19 NOTE — Op Note (Signed)
 [facility] Patient Name: Kim Dixon Procedure Date: 09/19/2023 8:03 AM MRN: [id number] Date of Birth: 12/03/1940 Attending MD: Toribio Fortune , , [id number] CSN: [id number] Age: 83 Admit Type: Outpatient Procedure:                Upper GI endoscopy Indications:              Melena Providers:                Toribio Fortune, Harlene Lips, Rosina Sprague,                            Italy Wilson, Technician Referring MD:              Medicines:                Monitored Anesthesia Care Complications:            No immediate complications. Estimated Blood Loss:     Estimated blood loss: none. Procedure:                Pre-Anesthesia Assessment:                           - Prior to the procedure, a History and Physical                            was performed, and patient medications, allergies                            and sensitivities were reviewed. The patient's                            tolerance of previous anesthesia was reviewed.                           - The risks and benefits of the procedure and the                            sedation options and risks were discussed with the                            patient. All questions were answered and informed                            consent was obtained.                           - ASA Grade Assessment: III - A patient with severe                            systemic disease.                           After obtaining informed consent, the endoscope was                            passed under direct vision. Throughout the  procedure, the patient's blood pressure, pulse, and                            oxygen saturations were monitored continuously. The                            GIF-H190 ([id number]) scope was introduced through the                            mouth, and advanced to the second part of duodenum.                            The upper GI endoscopy was accomplished without                             difficulty. The patient tolerated the procedure                            well. Scope In: 8:13:32 AM Scope Out: 8:19:07 AM Total Procedure Duration: 0 hours 5 minutes 35 seconds  Findings:      The examined esophagus was normal.      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 Biopsies (Sydney protocol) were taken with a cold       forceps for histology.      The examined duodenum was normal. Impression:               - Normal esophagus.                           - Mucosal changes in the pylorus. Biopsied.                           - Normal examined duodenum. Moderate Sedation:      Per Anesthesia Care Recommendation:           - Discharge patient to home (ambulatory).                           - Resume previous diet.                           - Await pathology results.                           - Schedule colonoscopy in next available.                           - Discuss with cardiologist and pulmonologist low                            saturation. Procedure Code(s):        --- Professional ---                           [phone], Esophagogastroduodenoscopy, flexible,  transoral; with biopsy, single or multiple Diagnosis Code(s):        --- Professional ---                           K31.89, Other diseases of stomach and duodenum                           K92.1, Melena (includes Hematochezia) CPT copyright 2022 American Medical Association. All rights reserved. The codes documented in this report are preliminary and upon coder review may  be revised to meet current compliance requirements. Toribio Fortune, MD Toribio Fortune,  09/19/2023 8:28:36 AM This report has been signed electronically. Number of Addenda: 0

## 2023-09-19 NOTE — Transfer of Care (Signed)
 Immediate Anesthesia Transfer of Care Note  Patient: [name]  Procedure(s) Performed: EGD (ESOPHAGOGASTRODUODENOSCOPY)  Patient Location: PACU  Anesthesia Type:MAC  Level of Consciousness: awake, alert , oriented, and patient cooperative  Airway & Oxygen Therapy: Patient Spontanous Breathing and Patient connected to nasal cannula oxygen  Post-op Assessment: Report given to RN, Post -op Vital signs reviewed and stable, and Patient moving all extremities X 4  Post vital signs: Reviewed and stable  Last Vitals:  Vitals Value Taken Time  BP 134/60 09/19/23 08:23  Temp 36.8 C 09/19/23 08:23  Pulse 48 09/19/23 08:23  Resp 11 09/19/23 08:23  SpO2 89 % 09/19/23 08:23    Last Pain:  Vitals:   09/19/23 0823  TempSrc: Axillary  PainSc: 0-No pain      Patients Stated Pain Goal: 7 (09/19/23 0655)  Complications: No notable events documented. Pt at baseline.

## 2023-09-19 NOTE — Telephone Encounter (Signed)
 Pt has been scheduled for 10/02/23. Offered sooner appt but couldn't do that date. Instructions mailed and prep sent to pharmacy

## 2023-09-19 NOTE — Telephone Encounter (Signed)
 Per Dr.Castaneda: Hi Kim Dixon,  Can you please schedule her for colonoscopy in next available slot ASAP. Any room. Diagnosis: anemia, melena

## 2023-09-20 ENCOUNTER — Encounter (HOSPITAL_COMMUNITY): Payer: Self-pay | Admitting: Gastroenterology

## 2023-09-25 ENCOUNTER — Ambulatory Visit (INDEPENDENT_AMBULATORY_CARE_PROVIDER_SITE_OTHER): Payer: Self-pay | Admitting: Gastroenterology

## 2023-09-25 LAB — SURGICAL PATHOLOGY

## 2023-09-27 NOTE — Progress Notes (Signed)
 procedure note and pathology result faxed to PCP Patient result letter mailed

## 2023-09-28 ENCOUNTER — Encounter (HOSPITAL_COMMUNITY)
Admission: RE | Admit: 2023-09-28 | Discharge: 2023-09-28 | Disposition: A | Discharge status: Home / Self Care | Source: Ambulatory Visit | Attending: Gastroenterology | Admitting: Gastroenterology

## 2023-09-28 NOTE — Pre-Procedure Instructions (Signed)
 Preop phone call done. Patient states she was expecting Dr Samuel office to refer her to Dr Margarite, pulmonologist, because she had trouble with her breathing, after her EGD. According to the post-op note, her sats remained very low after her EGD. Dr Eartha messaged to see if she needs to see pulmonology before her TCS is done.

## 2023-09-28 NOTE — Pre-Procedure Instructions (Signed)
  RE: referral Received: Today Kim Angelia Sieving, MD  Rhenda Luke GAILS, RN; Gaylene Madelin CROME, LPN; Jeanell Graeme RAMAN, CMA; Blanca Jenkins DEL Hi, She does not need to have the referral before having this procedure. She can go ahead and have the colonoscopy performed.  Hi Ann, can you please refer the patient to pulmonology at [facility]? Dx: desaturation, possible COPD.  Thanks,   Dixon Eartha, MD Gastroenterology and Hepatology [facility]       Previous Messages    ----- Message ----- From: Rhenda Luke GAILS, RN Sent: 09/28/2023  10:56 AM EDT To: Madelin CROME Gaylene, LPN; Graeme RAMAN Jeanell, CMA* Subject: referral                                      Good morning! I just got off of the phone with [name]. According to her record from her upper endoscopy, she needed to see a pulmonologist due to her sats post procedure being very low. She told me that she thought she was supposed to get a referral from you guys for her to see Dr Margarite and said that she has not heard anything about this. Does she this referral before she has her TCS done? She is scheduled for 10/02/2023.

## 2023-10-01 ENCOUNTER — Telehealth (INDEPENDENT_AMBULATORY_CARE_PROVIDER_SITE_OTHER): Payer: Self-pay | Admitting: *Deleted

## 2023-10-01 NOTE — Telephone Encounter (Signed)
-----   Message from Toribio Fortune Mayorga sent at 09/28/2023  2:33 PM EDT ----- Regarding: RE: referral Hi, She does not need to have the referral before having this procedure. She can go ahead and have the colonoscopy performed.  Hi Sajid Ruppert, can you please refer the patient to pulmonology at [facility]? Dx: desaturation, possible COPD.  Thanks,   Toribio Fortune, MD Gastroenterology and Hepatology [facility] ----- Message ----- From: Rhenda Luke GAILS, RN Sent: 09/28/2023  10:56 AM EDT To: Madelin LITTIE Ponto, LPN; Graeme GORMAN Kitchens, CMA# Subject: referral                                       Good morning! I just got off of the phone with [name]. According to her record from her upper endoscopy, she needed to see a pulmonologist due to her sats post procedure being very low. She told me that she thought she was supposed to get a referral from you guys for her to see Dr Margarite and said that she has not heard anything about this. Does she this referral before she has her TCS done?  She is scheduled for 10/02/2023.

## 2023-10-01 NOTE — Telephone Encounter (Signed)
 Referral sent, they will contact patient with apt

## 2023-10-02 ENCOUNTER — Encounter (HOSPITAL_COMMUNITY)
Admission: RE | Disposition: A | Discharge status: Home / Self Care | Payer: Self-pay | Source: Home / Self Care | Attending: Gastroenterology

## 2023-10-02 ENCOUNTER — Ambulatory Visit (HOSPITAL_COMMUNITY): Admitting: Anesthesiology

## 2023-10-02 ENCOUNTER — Encounter (INDEPENDENT_AMBULATORY_CARE_PROVIDER_SITE_OTHER): Payer: Self-pay | Admitting: *Deleted

## 2023-10-02 ENCOUNTER — Telehealth: Payer: Self-pay | Admitting: *Deleted

## 2023-10-02 ENCOUNTER — Ambulatory Visit (HOSPITAL_COMMUNITY)
Admission: RE | Admit: 2023-10-02 | Discharge: 2023-10-02 | Disposition: A | Discharge status: Home / Self Care | Attending: Gastroenterology | Admitting: Gastroenterology

## 2023-10-02 DIAGNOSIS — K921 Melena: Secondary | ICD-10-CM | POA: Diagnosis not present

## 2023-10-02 DIAGNOSIS — I10 Essential (primary) hypertension: Secondary | ICD-10-CM

## 2023-10-02 DIAGNOSIS — K648 Other hemorrhoids: Secondary | ICD-10-CM | POA: Diagnosis not present

## 2023-10-02 DIAGNOSIS — Z8673 Personal history of transient ischemic attack (TIA), and cerebral infarction without residual deficits: Secondary | ICD-10-CM | POA: Diagnosis not present

## 2023-10-02 DIAGNOSIS — Z79899 Other long term (current) drug therapy: Secondary | ICD-10-CM | POA: Insufficient documentation

## 2023-10-02 DIAGNOSIS — G4733 Obstructive sleep apnea (adult) (pediatric): Secondary | ICD-10-CM | POA: Diagnosis not present

## 2023-10-02 DIAGNOSIS — D175 Benign lipomatous neoplasm of intra-abdominal organs: Secondary | ICD-10-CM | POA: Diagnosis not present

## 2023-10-02 DIAGNOSIS — F419 Anxiety disorder, unspecified: Secondary | ICD-10-CM | POA: Insufficient documentation

## 2023-10-02 DIAGNOSIS — F32A Depression, unspecified: Secondary | ICD-10-CM | POA: Diagnosis not present

## 2023-10-02 DIAGNOSIS — Z7901 Long term (current) use of anticoagulants: Secondary | ICD-10-CM | POA: Diagnosis not present

## 2023-10-02 DIAGNOSIS — D1779 Benign lipomatous neoplasm of other sites: Secondary | ICD-10-CM | POA: Diagnosis not present

## 2023-10-02 DIAGNOSIS — I480 Paroxysmal atrial fibrillation: Secondary | ICD-10-CM | POA: Diagnosis not present

## 2023-10-02 DIAGNOSIS — D125 Benign neoplasm of sigmoid colon: Secondary | ICD-10-CM | POA: Diagnosis not present

## 2023-10-02 DIAGNOSIS — D122 Benign neoplasm of ascending colon: Secondary | ICD-10-CM | POA: Insufficient documentation

## 2023-10-02 DIAGNOSIS — D124 Benign neoplasm of descending colon: Secondary | ICD-10-CM | POA: Insufficient documentation

## 2023-10-02 DIAGNOSIS — I209 Angina pectoris, unspecified: Secondary | ICD-10-CM | POA: Diagnosis not present

## 2023-10-02 DIAGNOSIS — K5730 Diverticulosis of large intestine without perforation or abscess without bleeding: Secondary | ICD-10-CM | POA: Diagnosis not present

## 2023-10-02 DIAGNOSIS — Z85828 Personal history of other malignant neoplasm of skin: Secondary | ICD-10-CM | POA: Diagnosis not present

## 2023-10-02 DIAGNOSIS — K635 Polyp of colon: Secondary | ICD-10-CM | POA: Diagnosis not present

## 2023-10-02 DIAGNOSIS — D120 Benign neoplasm of cecum: Secondary | ICD-10-CM | POA: Insufficient documentation

## 2023-10-02 DIAGNOSIS — I4891 Unspecified atrial fibrillation: Secondary | ICD-10-CM | POA: Diagnosis not present

## 2023-10-02 DIAGNOSIS — Z87891 Personal history of nicotine dependence: Secondary | ICD-10-CM | POA: Diagnosis not present

## 2023-10-02 DIAGNOSIS — D123 Benign neoplasm of transverse colon: Secondary | ICD-10-CM | POA: Insufficient documentation

## 2023-10-02 HISTORY — PX: COLONOSCOPY: SHX5424

## 2023-10-02 LAB — HM COLONOSCOPY

## 2023-10-02 SURGERY — COLONOSCOPY
Anesthesia: General

## 2023-10-02 MED ORDER — SPOT INK MARKER SYRINGE KIT
PACK | SUBMUCOSAL | Status: DC | PRN
  Administered 2023-10-02: .2 mL via SUBMUCOSAL

## 2023-10-02 MED ORDER — PROPOFOL 500 MG/50ML IV EMUL
INTRAVENOUS | Status: DC | PRN
  Administered 2023-10-02: 125 ug/kg/min via INTRAVENOUS
  Administered 2023-10-02: 80 mg via INTRAVENOUS
  Administered 2023-10-02: 20 mg via INTRAVENOUS

## 2023-10-02 MED ORDER — EPHEDRINE SULFATE-NACL 50-0.9 MG/10ML-% IV SOSY
PREFILLED_SYRINGE | INTRAVENOUS | Status: DC | PRN
  Administered 2023-10-02 (×4): 5 mg via INTRAVENOUS

## 2023-10-02 MED ORDER — LACTATED RINGERS IV SOLN: INTRAVENOUS | Status: DC | PRN

## 2023-10-02 MED ORDER — SODIUM CHLORIDE (PF) 0.9 % IJ SOLN
PREFILLED_SYRINGE | INTRAMUSCULAR | Status: DC | PRN
  Administered 2023-10-02: 1 mL

## 2023-10-02 NOTE — Telephone Encounter (Signed)
-----   Message from Toribio Fortune Mayorga sent at 10/02/2023 11:54 AM EDT ----- Hi Pamla Pangle/Tammy,   Can you please schedule a capsule endoscopy? Dx: melena and anemia.   Thanks,  Toribio Fortune, MD Gastroenterology and Hepatology [facility]

## 2023-10-02 NOTE — Telephone Encounter (Signed)
 Pending review by [facility] Tracking [phone]

## 2023-10-02 NOTE — Discharge Instructions (Signed)
 You are being discharged to home.  Resume your previous diet.  We are waiting for your pathology results.  Your physician has recommended a repeat colonoscopy in nine months for surveillance after today's piecemeal polypectomy.  Proceed with capsule endoscopy.

## 2023-10-02 NOTE — Interval H&P Note (Signed)
 History and Physical Interval Note:  10/02/2023 9:03 AM  Kim Dixon  has presented today for surgery, with the diagnosis of anemia, melena.  The various methods of treatment have been discussed with the patient and family. After consideration of risks, benefits and other options for treatment, the patient has consented to  Procedure(s) with comments: COLONOSCOPY (N/A) - 10:30 AM, ASA 1/2 as a surgical intervention.  The patient's history has been reviewed, patient examined, no change in status, stable for surgery.  I have reviewed the patient's chart and labs.  Questions were answered to the patient's satisfaction.     Dois Juarbe Castaneda Mayorga

## 2023-10-02 NOTE — Op Note (Signed)
 [facility] Patient Name: Kim Dixon Procedure Date: 10/02/2023 10:46 AM MRN: [id number] Date of Birth: [birth year] Attending MD: Toribio Fortune , , [id number] CSN: [id number] Age: 83 Admit Type: Outpatient Procedure:                Colonoscopy Indications:              Melena Providers:                Toribio Fortune, Rosina Sprague, [place] Wilson,                            Technician Referring MD:              Medicines:                Monitored Anesthesia Care Complications:            No immediate complications. Estimated Blood Loss:     Estimated blood loss: none. Procedure:                Pre-Anesthesia Assessment:                           - Prior to the procedure, a History and Physical                            was performed, and patient medications, allergies                            and sensitivities were reviewed. The patient's                            tolerance of previous anesthesia was reviewed.                           - The risks and benefits of the procedure and the                            sedation options and risks were discussed with the                            patient. All questions were answered and informed                            consent was obtained.                           - ASA Grade Assessment: III - A patient with severe                            systemic disease.                           After obtaining informed consent, the colonoscope                            was passed under direct vision. Throughout the  procedure, the patient's blood pressure, pulse, and                            oxygen saturations were monitored continuously. The                            [id number]-HQ190L ([id number]) scope was introduced through                            the anus and advanced to the the terminal ileum.                            The colonoscopy was performed without difficulty.                            The  patient tolerated the procedure well. The                            quality of the bowel preparation was good. Scope In: 11:02:41 AM Scope Out: 11:49:35 AM Scope Withdrawal Time: 0 hours 44 minutes 6 seconds  Total Procedure Duration: 0 hours 46 minutes 54 seconds  Findings:      The perianal and digital rectal examinations were normal.      The terminal ileum appeared normal.      A 20 to 25 mm polyp was found in the proximal ascending colon. The polyp       was sessile. Area was successfully injected with 6 mL Eleview for a lift       polypectomy. Imaging was performed using white light and narrow band       imaging to visualize the mucosa and demarcate the polyp site after       injection for EMR purposes. The polyp was removed with a piecemeal       technique using a hot snare. Resection and retrieval were complete.       After completeing the polypectomy, the edges of the polypectomy site       were ablated with the tip of the snare, soft coagulation settings. To       prevent bleeding after the polypectomy, three hemostatic clips were       successfully placed. Clip manufacturer: [employer]. There was no       bleeding at the end of the procedure. Area 2 cm distal was tattooed with       an injection of 0.2 mL of [place] ink.      There was a small lipoma, in the ascending colon.      Three sessile polyps were found in the transverse colon, ascending colon       and cecum. The polyps were 3 to 6 mm in size. These polyps were removed       with a cold snare. Resection and retrieval were complete.      Two sessile polyps were found in the descending colon. The polyps were 2       to 4 mm in size. These polyps were removed with a cold snare. Resection       and retrieval were complete.      Scattered small-mouthed diverticula were found in the sigmoid colon and  descending colon.      Non-bleeding internal hemorrhoids were found during retroflexion. The       hemorrhoids  were small. Impression:               - The examined portion of the ileum was normal.                           - One 20 to 25 mm polyp in the proximal ascending                            colon, removed piecemeal using a hot snare.                            Resected and retrieved. Injected. Clips were                            placed. Clip manufacturer: [employer].                            Tattooed.                           - Small lipoma in the ascending colon.                           - Three 3 to 6 mm polyps in the transverse colon,                            in the ascending colon and in the cecum, removed                            with a cold snare. Resected and retrieved.                           - Two 2 to 4 mm polyps in the descending colon,                            removed with a cold snare. Resected and retrieved.                           - Diverticulosis in the sigmoid colon and in the                            descending colon.                           - Non-bleeding internal hemorrhoids. Moderate Sedation:      Per Anesthesia Care Recommendation:           - Discharge patient to home (ambulatory).                           - Resume previous diet.                           -  Await pathology results.                           - Repeat colonoscopy in 9 months for surveillance                            after piecemeal polypectomy.                           - Proceed with capsule endoscopy. Procedure Code(s):        --- Professional ---                           [phone], 59, Colonoscopy, flexible; with removal of                            tumor(s), polyp(s), or other lesion(s) by snare                            technique                           45381, Colonoscopy, flexible; with directed                            submucosal injection(s), any substance Diagnosis Code(s):        --- Professional ---                           K64.8, Other hemorrhoids                            D12.2, Benign neoplasm of ascending colon                           D17.5, Benign lipomatous neoplasm of                            intra-abdominal organs                           D12.3, Benign neoplasm of transverse colon (hepatic                            flexure or splenic flexure)                           D12.0, Benign neoplasm of cecum                           D12.4, Benign neoplasm of descending colon                           K92.1, Melena (includes Hematochezia)                           K57.30, Diverticulosis of large intestine without  perforation or abscess without bleeding CPT copyright 2022 American Medical Association. All rights reserved. The codes documented in this report are preliminary and upon coder review may  be revised to meet current compliance requirements. Toribio Fortune, MD Toribio Fortune,  10/02/2023 11:54:31 AM This report has been signed electronically. Number of Addenda: 0

## 2023-10-02 NOTE — Transfer of Care (Signed)
 Immediate Anesthesia Transfer of Care Note  Patient: Kim Dixon  Procedure(s) Performed: COLONOSCOPY  Patient Location: Short Stay  Anesthesia Type:General  Level of Consciousness: awake and patient cooperative  Airway & Oxygen Therapy: Patient Spontanous Breathing  Post-op Assessment: Report given to RN and Post -op Vital signs reviewed and stable  Post vital signs: Reviewed and stable  Last Vitals:  Vitals Value Taken Time  BP 127/66 10/02/23 11:56  Temp 36.4 C 10/02/23 11:56  Pulse 53 10/02/23 11:56  Resp 17 10/02/23 11:56  SpO2 99 % 10/02/23 11:56    Last Pain:  Vitals:   10/02/23 1156  TempSrc: Oral  PainSc: 0-No pain         Complications: No notable events documented.

## 2023-10-02 NOTE — Anesthesia Postprocedure Evaluation (Signed)
 Anesthesia Post Note  Patient: Kim Dixon  Procedure(s) Performed: COLONOSCOPY  Patient location during evaluation: Phase II Anesthesia Type: General Level of consciousness: awake and alert Pain management: pain level controlled Vital Signs Assessment: post-procedure vital signs reviewed and stable Respiratory status: spontaneous breathing, nonlabored ventilation, respiratory function stable and patient connected to nasal cannula oxygen Cardiovascular status: blood pressure returned to baseline and stable Postop Assessment: no apparent nausea or vomiting Anesthetic complications: no   There were no known notable events for this encounter.   Last Vitals:  Vitals:   10/02/23 1156 10/02/23 1201  BP: 127/66 130/62  Pulse: (!) 53 (!) 52  Resp: 17 18  Temp: 36.4 C   SpO2: 99% 98%    Last Pain:  Vitals:   10/02/23 1201  TempSrc:   PainSc: 0-No pain                 Stacy Deshler L Dyesha Henault

## 2023-10-02 NOTE — Anesthesia Procedure Notes (Signed)
 Date/Time: 10/02/2023 10:55 AM  Performed by: Barbarann Verneita RAMAN, CRNAPre-anesthesia Checklist: Patient identified, Emergency Drugs available, Suction available, Timeout performed and Patient being monitored Patient Re-evaluated:Patient Re-evaluated prior to induction Oxygen Delivery Method: Non-rebreather mask

## 2023-10-02 NOTE — Anesthesia Preprocedure Evaluation (Addendum)
 Anesthesia Evaluation  Patient identified by MRN, date of birth, ID band Patient awake    Reviewed: Allergy & Precautions, H&P , NPO status , Patient's Chart, lab work & pertinent test results  Airway Mallampati: III  TM Distance: >3 FB Neck ROM: Full    Dental no notable dental hx. (+) Dental Advisory Given, Teeth Intact   Pulmonary shortness of breath, sleep apnea , pneumonia, former smoker   breath sounds clear to auscultation + wheezing      Cardiovascular hypertension, + angina  + dysrhythmias Atrial Fibrillation  Rhythm:Regular Rate:Normal     Neuro/Psych  PSYCHIATRIC DISORDERS Anxiety Depression    TIA   GI/Hepatic negative GI ROS, Neg liver ROS,,,  Endo/Other  negative endocrine ROS    Renal/GU negative Renal ROS  negative genitourinary   Musculoskeletal  (+) Arthritis ,    Abdominal   Peds negative pediatric ROS (+)  Hematology negative hematology ROS (+)   Anesthesia Other Findings   Reproductive/Obstetrics negative OB ROS                              Anesthesia Physical Anesthesia Plan  ASA: 3  Anesthesia Plan: General   Post-op Pain Management:    Induction: Intravenous  PONV Risk Score and Plan: Propofol  infusion  Airway Management Planned: Nasal Cannula and Natural Airway  Additional Equipment:   Intra-op Plan:   Post-operative Plan:   Informed Consent: I have reviewed the patients History and Physical, chart, labs and discussed the procedure including the risks, benefits and alternatives for the proposed anesthesia with the patient or authorized representative who has indicated his/her understanding and acceptance.     Dental advisory given  Plan Discussed with: CRNA  Anesthesia Plan Comments:          Anesthesia Quick Evaluation

## 2023-10-03 ENCOUNTER — Ambulatory Visit (INDEPENDENT_AMBULATORY_CARE_PROVIDER_SITE_OTHER): Payer: Self-pay | Admitting: Gastroenterology

## 2023-10-03 LAB — SURGICAL PATHOLOGY

## 2023-10-04 ENCOUNTER — Encounter (HOSPITAL_COMMUNITY): Payer: Self-pay | Admitting: Gastroenterology

## 2023-10-04 NOTE — Progress Notes (Signed)
 9 mth TCS noted in recall procedure note and pathology result faxed to PCP

## 2023-10-04 NOTE — Telephone Encounter (Signed)
 PA still pending review.

## 2023-10-08 NOTE — Telephone Encounter (Signed)
 Spoke with pt. Scheduled for 8/5. Discussed instructions on the phone and also mailed.

## 2023-10-08 NOTE — Telephone Encounter (Signed)
 PA approved  Authorization #[id number] DOS: 10/02/2023 - 11/19/2023

## 2023-10-11 ENCOUNTER — Ambulatory Visit: Attending: Student | Admitting: Student

## 2023-10-11 ENCOUNTER — Encounter: Payer: Self-pay | Admitting: Student

## 2023-10-11 VITALS — BP 110/60 | HR 82 | Ht 63.5 in | Wt 264.4 lb

## 2023-10-11 DIAGNOSIS — I10 Essential (primary) hypertension: Secondary | ICD-10-CM

## 2023-10-11 DIAGNOSIS — G40909 Epilepsy, unspecified, not intractable, without status epilepticus: Secondary | ICD-10-CM

## 2023-10-11 DIAGNOSIS — D649 Anemia, unspecified: Secondary | ICD-10-CM | POA: Diagnosis not present

## 2023-10-11 DIAGNOSIS — Z79899 Other long term (current) drug therapy: Secondary | ICD-10-CM

## 2023-10-11 DIAGNOSIS — N1831 Chronic kidney disease, stage 3a: Secondary | ICD-10-CM | POA: Diagnosis not present

## 2023-10-11 DIAGNOSIS — I480 Paroxysmal atrial fibrillation: Secondary | ICD-10-CM | POA: Diagnosis not present

## 2023-10-11 DIAGNOSIS — I5032 Chronic diastolic (congestive) heart failure: Secondary | ICD-10-CM | POA: Diagnosis not present

## 2023-10-11 MED ORDER — AMIODARONE HCL 200 MG PO TABS: 200.0000 mg | ORAL_TABLET | Freq: Every day | ORAL | 3 refills | Status: DC

## 2023-10-11 NOTE — Patient Instructions (Signed)
 Medication Instructions:  Your physician recommends that you continue on your current medications as directed. Please refer to the Current Medication list given to you today.  *If you need a refill on your cardiac medications before your next appointment, please call your pharmacy*  Lab Work: Your physician recommends that you return for lab work in: Today ( CBC, CMET, TSH)   If you have labs (blood work) drawn today and your tests are completely normal, you will receive your results only by: MyChart Message (if you have MyChart) OR A paper copy in the mail If you have any lab test that is abnormal or we need to change your treatment, we will call you to review the results.  Testing/Procedures: NONE   Follow-Up: At [facility], you and your health needs are our priority.  As part of our continuing mission to provide you with exceptional heart care, our providers are all part of one team.  This team includes your primary Cardiologist (physician) and Advanced Practice Providers or APPs (Physician Assistants and Nurse Practitioners) who all work together to provide you with the care you need, when you need it.  Your next appointment:   3 month(s)  Provider:   You may see Jayson Sierras, MD or one of the following Advanced Practice Providers on your designated Care Team:   Laymon Qua, PA-C  [place], [place] Olivia Pavy, [place]     We recommend signing up for the patient portal called MyChart.  Sign up information is provided on this After Visit Summary.  MyChart is used to connect with patients for Virtual Visits (Telemedicine).  Patients are able to view lab/test results, encounter notes, upcoming appointments, etc.  Non-urgent messages can be sent to your provider as well.   To learn more about what you can do with MyChart, go to [email or web address].   Other Instructions Thank you for choosing [facility]!

## 2023-10-11 NOTE — Progress Notes (Signed)
 Cardiology Office Note    Date:  10/11/2023  ID:  Kim Dixon, DOB 06/22/1940, MRN [id number] Cardiologist: Jayson Sierras, MD Cardiology APP:  Johnson Laymon HERO, PA-C { : History of Present Illness:    Kim Dixon is a 83 y.o. female with past medical history of paroxysmal atrial fibrillation, chronic HFpEF, HTN, HLD and normal coronary arteries by cardiac catheterization in 11/2019 who presents to the office today for follow-up.  She was examined by myself in 07/2023 and reported worsening depression and was sleeping for up to 20 hours at a time. She was found to be in atrial flutter at the time of her office visit and this was reviewed with Dr. Sierras. He recommended starting antiarrhythmic therapy with Amiodarone  200 mg twice daily for 1 week followed by 200 mg daily and arrange for a nurse visit in 2 weeks. If still in atrial fibrillation/flutter, would arrange for DCCV following 3 weeks of uninterrupted anticoagulation. She had only been taking Eliquis  once daily and the importance of compliance with twice daily dosing was reviewed. At the time of her nurse visit on 08/08/2023, she was back in normal sinus rhythm but she was bradycardic and Lopressor  was reduced to 25 mg twice daily.  She did contact the office later that month reporting dark, tarry stools and occult blood was obtained and positive. Hemoglobin was also down to 8.7 (previously 13.6 in 03/2023). Was recommended to hold Eliquis  and she was referred to the ED but discharged and informed to follow-up with GI as an outpatient. She underwent EGD on 09/19/2023 which was normal. Underwent colonoscopy as well which showed showed multiple polyps along with diverticulosis in the sigmoid colon and along the descending colon. She was also noted to have nonbleeding internal hemorrhoids. Was recommended to repeat colonoscopy in 9 months for surveillance after undergoing piecemeal polypectomy. Capsule study was also recommended and  scheduled for 10/23/2023.  In talking the patient and her daughter today, she denies any recent chest pain or palpitations. No specific orthopnea, PND or pitting edema. Takes Torsemide  40 mg daily. She does have baseline dyspnea on exertion but says her activity is very limited given her back pain. She does occasional chores around her home but is unable to walk around the grocery store given her pain. She is scheduled for a capsule study next month.  Denies any melena, hematochezia or hematuria. Has remained off anticoagulation per GI recommendations.  She does report brief episodes of unconsciousness which last for a second or two. Unaware of any prodromal symptoms. No upper or lower extremity weakness, slurred speech or tremors. No chest pain or palpitations with these episodes. Reports they were previously occurring and she was started on Keppra and symptoms significantly improved. Now having more frequent episodes approximately a few days a week. She is no longer driving and we reviewed that she should not be driving given these episodes.   Studies Reviewed:   EKG: EKG is not ordered today.  Echocardiogram: 09/2019 IMPRESSIONS     1. Left ventricular ejection fraction, by estimation, is 60 to 65%. The  left ventricle has normal function. The left ventricle has no regional  wall motion abnormalities. Left ventricular diastolic parameters were  normal.   2. Right ventricular systolic function is normal. The right ventricular  size is normal.   3. Left atrial size was moderately dilated.   4. The mitral valve is normal in structure. Trivial mitral valve  regurgitation. No evidence of mitral stenosis.  5. The aortic valve is tricuspid. Aortic valve regurgitation is not  visualized. No aortic stenosis is present.   6. The inferior vena cava is dilated in size with >50% respiratory  variability, suggesting right atrial pressure of 8 mmHg.   Cardiac Catheterization: 11/2019 The left  ventricular systolic function is normal. LV end diastolic pressure is moderately elevated. The left ventricular ejection fraction is 55-65% by visual estimate.   1.  Normal coronary arteries. 2.  Normal LV systolic function.  Mitral annular calcifications noted. 3.  Moderately elevated left ventricular end-diastolic pressure at 24 mmHg.   Recommendations: The patient symptoms could be related to diastolic heart failure.  LVEDP was moderately elevated but the patient received aggressive precath hydration which might have contributed.  Continue torsemide  but consider adjusting the dose as needed.  Risk Assessment/Calculations:    CHA2DS2-VASc Score = 4  This indicates a 4.8% annual risk of stroke. The patient's score is based upon: CHF History: 0 HTN History: 1 Diabetes History: 0 Stroke History: 0 Vascular Disease History: 0 Age Score: 2 Gender Score: 1   Physical Exam:   VS:  BP 110/60 (BP Location: Right Arm, Cuff Size: Large)   Pulse 82   Ht 5' 3.5 (1.613 m)   Wt 264 lb 6.4 oz (119.9 kg)   SpO2 92%   BMI 46.10 kg/m    Wt Readings from Last 3 Encounters:  10/11/23 264 lb 6.4 oz (119.9 kg)  09/19/23 265 lb (120.2 kg)  09/12/23 265 lb 1.6 oz (120.2 kg)     GEN: Pleasant, elderly female appearing in no acute distress NECK: No JVD; No carotid bruits CARDIAC: RRR, no murmurs, rubs, gallops RESPIRATORY:  Clear to auscultation without rales, wheezing or rhonchi  ABDOMEN: Appears non-distended. No obvious abdominal masses. EXTREMITIES: No clubbing or cyanosis. No pitting edema.  Distal pedal pulses are 2+ bilaterally.   Assessment and Plan:   1. Paroxysmal atrial fibrillation (HCC) - She denies any recent palpitations and is in normal sinus rhythm by examination today. Was started on antiarrhythmic therapy with Amiodarone  earlier this year and remains on Amiodarone  200 mg daily along with Lopressor  25 mg twice daily. Will recheck TSH and LFT's with upcoming labs given  the use of Amiodarone .  - She was previously on Eliquis  but this has temporarily been held given her anemia and melena as she undergoes workup by GI. Will follow-up with them once results of her capsule study are available. She may ultimately be a candidate for Watchman device placement and we briefly reviewed this today.  2. Chronic heart failure with preserved ejection fraction (HCC) - Echocardiogram in 2021 showed a preserved EF of 60 to 65% with normal RV function. She denies any lower extremity edema and uses her CPAP at night. Continue current diuretic therapy with Torsemide  40 mg daily.  Will recheck renal function and electrolytes with upcoming labs.   3. Essential hypertension - BP is well-controlled at 110/60 during today's visit. Continue current medical therapy with Losartan  25 mg daily and Lopressor  25 mg twice daily.  4. Stage 3 CKD - Creatinine was mildly elevated above baseline at 1.20 when checked on 08/27/2023. Will recheck a CMET with upcoming labs.   5. Anemia/Melena - Being followed by GI. EGD was unrevealing earlier this month and colonoscopy showed multiple polyps along with diverticulosis in the sigmoid colon and along the descending colon. She was also noted to have nonbleeding internal hemorrhoids. Scheduled for capsule study on 10/23/2023. - Will send a  note to GI to follow-up on if anticoagulation can be resumed following capsule study results.  6. Seizure Disorder - She has a history of known seizure disorder and reports episodes as outlined above with brief episodes of unconsciousness for a few seconds but no associated upper or lower extremity weakness, slurred speech or tremors. Previously followed by Dr. Milton but no neurology follow-up since his retirement. Will refer to Neurology for further assessment. She remains on Keppra 250 mg twice daily as this has been managed by her PCP in the interim.  Signed, Laymon CHRISTELLA Qua, PA-C

## 2023-10-12 ENCOUNTER — Ambulatory Visit: Payer: Self-pay | Admitting: Student

## 2023-10-12 DIAGNOSIS — Z79899 Other long term (current) drug therapy: Secondary | ICD-10-CM

## 2023-10-12 LAB — CBC
Hematocrit: 33.9 % — ABNORMAL LOW (ref 34.0–46.6)
Hemoglobin: 10 g/dL — ABNORMAL LOW (ref 11.1–15.9)
MCH: 26.4 pg — ABNORMAL LOW (ref 26.6–33.0)
MCHC: 29.5 g/dL — ABNORMAL LOW (ref 31.5–35.7)
MCV: 89 fL (ref 79–97)
Platelets: 219 x10E3/uL (ref 150–450)
RBC: 3.79 x10E6/uL (ref 3.77–5.28)
RDW: 16.1 % — ABNORMAL HIGH (ref 11.7–15.4)
WBC: 6.8 x10E3/uL (ref 3.4–10.8)

## 2023-10-12 LAB — COMPREHENSIVE METABOLIC PANEL WITH GFR
ALT: 10 IU/L (ref 0–32)
AST: 15 IU/L (ref 0–40)
Albumin: 4.2 g/dL (ref 3.7–4.7)
Alkaline Phosphatase: 71 IU/L (ref 44–121)
BUN/Creatinine Ratio: 14 (ref 12–28)
BUN: 21 mg/dL (ref 8–27)
Bilirubin Total: 0.6 mg/dL (ref 0.0–1.2)
CO2: 27 mmol/L (ref 20–29)
Calcium: 9.3 mg/dL (ref 8.7–10.3)
Chloride: 99 mmol/L (ref 96–106)
Creatinine, Ser: 1.54 mg/dL — ABNORMAL HIGH (ref 0.57–1.00)
Globulin, Total: 2.2 g/dL (ref 1.5–4.5)
Glucose: 106 mg/dL — ABNORMAL HIGH (ref 70–99)
Potassium: 4.8 mmol/L (ref 3.5–5.2)
Sodium: 145 mmol/L — ABNORMAL HIGH (ref 134–144)
Total Protein: 6.4 g/dL (ref 6.0–8.5)
eGFR: 34 mL/min/1.73 — ABNORMAL LOW (ref >59)

## 2023-10-12 LAB — TSH: TSH: 21.1 u[IU]/mL — ABNORMAL HIGH (ref 0.450–4.500)

## 2023-10-12 MED ORDER — TORSEMIDE 20 MG PO TABS: 20.0000 mg | ORAL_TABLET | Freq: Every day | ORAL | 3 refills | Status: AC

## 2023-10-12 MED ORDER — AMIODARONE HCL 200 MG PO TABS: 100.0000 mg | ORAL_TABLET | Freq: Every day | ORAL | 3 refills | Status: DC

## 2023-10-12 NOTE — Telephone Encounter (Signed)
-----   Message from Laymon CHRISTELLA Qua sent at 10/12/2023  7:49 AM EDT ----- Please let the patient know that her hemoglobin continues to improve and is at 10.0. Platelets normal at 219K.  Potassium normal at 4.8 but kidney function has worsened with creatinine at 1.54 which  would suggest dehydration. Given that she did not appear volume overloaded by examination yesterday, would recommend reducing Torsemide  from 40 mg daily to 20 mg daily. Her thyroid  function is  abnormal with TSH at 21. Would also recommend reducing Amiodarone  to 100 mg daily and rechecking a BMET and TSH in 4 weeks.   I am going to tag Dr. Debera to see if he has any additional recommendations in regard to her abnormal TSH and Amiodarone  use. Will reduce dosing as discussed above. ----- Message ----- From: Interface, [facility] Lab Results In Sent: 10/12/2023   5:37 AM EDT To: Laymon CHRISTELLA Qua, PA-C

## 2023-10-12 NOTE — Addendum Note (Signed)
 Addended by: Rasean Joos G on: 10/12/2023 02:33 PM   Modules accepted: Orders

## 2023-10-12 NOTE — Telephone Encounter (Signed)
 Orders placed.

## 2023-10-23 ENCOUNTER — Encounter (HOSPITAL_COMMUNITY)
Admission: RE | Disposition: A | Discharge status: Home / Self Care | Payer: Self-pay | Source: Home / Self Care | Attending: Gastroenterology

## 2023-10-23 ENCOUNTER — Ambulatory Visit (HOSPITAL_COMMUNITY)
Admission: RE | Admit: 2023-10-23 | Discharge: 2023-10-23 | Disposition: A | Discharge status: Home / Self Care | Attending: Gastroenterology | Admitting: Gastroenterology

## 2023-10-23 DIAGNOSIS — D509 Iron deficiency anemia, unspecified: Secondary | ICD-10-CM | POA: Insufficient documentation

## 2023-10-23 DIAGNOSIS — K639 Disease of intestine, unspecified: Secondary | ICD-10-CM | POA: Diagnosis not present

## 2023-10-23 DIAGNOSIS — K921 Melena: Secondary | ICD-10-CM | POA: Insufficient documentation

## 2023-10-23 HISTORY — PX: GIVENS CAPSULE STUDY: SHX5432

## 2023-10-23 SURGERY — IMAGING PROCEDURE, GI TRACT, INTRALUMINAL, VIA CAPSULE

## 2023-10-24 ENCOUNTER — Telehealth: Payer: Self-pay | Admitting: Gastroenterology

## 2023-10-24 NOTE — Telephone Encounter (Signed)
 Hi Mindy/Tammy,   Can you please schedule a push enteroscopy? Dx: Iron-deficiency anemia, melena. Room: 3  Thanks,  Toribio Fortune, MD Gastroenterology and Hepatology [facility]

## 2023-10-24 NOTE — Procedures (Signed)
 Small Bowel Givens Capsule Study Procedure date: 10/23/2023  Referring Provider:  PCP PCP:  Dr. Sheryle Carwin, MD  Indication for procedure:  Melena  Findings:  Study was adequate as capsule reached the cecum and the bowel preparation was adequate in the small bowel. There was rapid small bowel transit time. There was presence of a subtle nodule in the small bowel, possibly in the mid part of the small bowel.  It looks erythematous but there was no presence of bleeding.     Summary & Recommendations: Rapid small bowel transit time.  There was presence of a subtle nodule in the small bowel, possibly in the mid part of the small bowel.   Will need to proceed with push enteroscopy with the hope of reaching the small bowel lesion.  If we are not able to reach the lesion, we may try restarting her anticoagulation and start her on oral iron supplementation.  If she presents recurrent melena with drop in her hemoglobin, may need to be referred to [facility] for double-balloon enteroscopy.  I personally communicated these recommendations to the patient's daughter Lyla Bunker.  Toribio Fortune, MD Gastroenterology and Hepatology [facility]

## 2023-10-25 ENCOUNTER — Encounter (HOSPITAL_COMMUNITY): Payer: Self-pay | Admitting: Gastroenterology

## 2023-10-25 NOTE — Telephone Encounter (Signed)
 LMOVM to call back

## 2023-10-29 NOTE — Telephone Encounter (Signed)
 Received VM from pt, call back, LMOVM

## 2023-11-01 NOTE — Telephone Encounter (Signed)
 LMOVM to call back

## 2023-11-15 NOTE — Telephone Encounter (Signed)
 LVM for call back

## 2023-11-25 NOTE — Progress Notes (Deleted)
 Kim Dixon, female    DOB: November 21, 1940    MRN: [id number]   Brief patient profile:  67  yof *** former [employer] Pt self-referred back to pulmonary clinic in [place]  11/26/2023  for asthma ? Allergic    PFT 07/06/21   FEV1 1.23 (64%), 0.83, p 16 % resp to saba and no rx prior/ TLC 4.19 (84%), DLCO 105%,   FeNO 06/05/22 :   39   History of Present Illness  11/26/2023  Pulmonary/ 1st office eval/ Tristin Vandeusen / [place] [facility]  No chief complaint on file.    Dyspnea:  *** Cough: *** Sleep: *** SABA use: *** 02 use:*** LDSCT:***  No obvious day to day or daytime pattern/variability or assoc excess/ purulent sputum or mucus plugs or hemoptysis or cp or chest tightness, subjective wheeze or overt sinus or hb symptoms.    Also denies any obvious fluctuation of symptoms with weather or environmental changes or other aggravating or alleviating factors except as outlined above   No unusual exposure hx or h/o childhood pna/ asthma or knowledge of premature birth.  Current Allergies, Complete Past Medical History, Past Surgical History, Family History, and Social History were reviewed in [employer] record.  ROS  The following are not active complaints unless bolded Hoarseness, sore throat, dysphagia, dental problems, itching, sneezing,  nasal congestion or discharge of excess mucus or purulent secretions, ear ache,   fever, chills, sweats, unintended wt loss or wt gain, classically pleuritic or exertional cp,  orthopnea pnd or arm/hand swelling  or leg swelling, presyncope, palpitations, abdominal pain, anorexia, nausea, vomiting, diarrhea  or change in bowel habits or change in bladder habits, change in stools or change in urine, dysuria, hematuria,  rash, arthralgias, visual complaints, headache, numbness, weakness or ataxia or problems with walking or coordination,  change in mood or  memory.            Outpatient Medications Prior to Visit  Medication Sig Dispense  Refill   albuterol  (VENTOLIN  HFA) 108 (90 Base) MCG/ACT inhaler Inhale 2 puffs into the lungs every 6 (six) hours as needed for wheezing or shortness of breath. 8 g 2   amiodarone  (PACERONE ) 200 MG tablet Take 0.5 tablets (100 mg total) by mouth daily. 45 tablet 3   Ascorbic Acid (VITAMIN C) 1000 MG tablet Take 3,000 mg by mouth daily.      atorvastatin (LIPITOR) 10 MG tablet Take 10 mg by mouth every Monday, Wednesday, and Friday at 8 PM.      bimatoprost (LUMIGAN) 0.01 % SOLN Place 1 drop into both eyes at bedtime.     Cyanocobalamin  (VITAMIN B-12 SL) Place 1 tablet under the tongue daily.     escitalopram (LEXAPRO) 10 MG tablet Take 10 mg by mouth daily.     fluticasone  furoate-vilanterol (BREO ELLIPTA ) 100-25 MCG/ACT AEPB Inhale 1 puff into the lungs daily. 30 each 5   levETIRAcetam (KEPPRA) 500 MG tablet Take 250 mg by mouth 2 (two) times daily.     losartan  (COZAAR ) 25 MG tablet Take 1 tablet (25 mg total) by mouth daily. 90 tablet 3   Menthol , Topical Analgesic, (BIOFREEZE ROLL-ON EX) Apply 1 application topically 3 (three) times daily as needed (back pain.).     metoprolol  tartrate (LOPRESSOR ) 25 MG tablet Take 1 tablet (25 mg total) by mouth 2 (two) times daily.     omeprazole  (PRILOSEC) 40 MG capsule Take 1 capsule (40 mg total) by mouth 2 (two) times daily. 180  capsule 1   potassium chloride  SA (KLOR-CON  M) 20 MEQ tablet TAKE ONE TABLET ( TOTAL) BY MOUTH DAILY 90 tablet 0   timolol (TIMOPTIC) 0.5 % ophthalmic solution Place 1 drop into both eyes daily.     torsemide  (DEMADEX ) 20 MG tablet Take 1 tablet (20 mg total) by mouth daily. 90 tablet 3   Vitamin D3 (VITAMIN D) 25 MCG tablet Take 1,000 Units by mouth daily.     Facility-Administered Medications Prior to Visit  Medication Dose Route Frequency Provider Last Rate Last Admin   sodium chloride  flush (NS) 0.9 % injection 3 mL  3 mL Intravenous Q12H Debera Jayson MATSU, MD        Past Medical History:  Diagnosis Date    Anxiety    Arthritis    Basal cell carcinoma    RIGHT THIGH BCC CX3 5FU   Basal cell carcinoma 12/16/2019   right anterior neck (CX35FU)   Depression    Diverticulosis    Essential hypertension    Glaucoma    Hematuria    History of kidney stones    History of pneumonia    Hyperlipidemia    OSA (obstructive sleep apnea) 10/30/2022   PAF (paroxysmal atrial fibrillation) (HCC)    Pneumonia    SCC (squamous cell carcinoma) 2023   left forearm inf. tx cx3 6fu   Squamous cell carcinoma of skin 2023   left forearm sup tx cx3 62fu   Stress incontinence    TIA (transient ischemic attack)    January 2020   Urinary frequency    Varicose veins       Objective:     There were no vitals taken for this visit.         Assessment

## 2023-11-26 ENCOUNTER — Encounter: Admitting: Internal Medicine

## 2023-11-26 DIAGNOSIS — R0609 Other forms of dyspnea: Secondary | ICD-10-CM

## 2023-12-03 ENCOUNTER — Telehealth (INDEPENDENT_AMBULATORY_CARE_PROVIDER_SITE_OTHER): Payer: Self-pay

## 2023-12-03 NOTE — Telephone Encounter (Signed)
 Patient called wanting to know if having the push enteroscopy is necessary. Since she says she had the polyp taken care of earlier this year. She says shouldn't this have taken care of the issue.She says she was taken off of he blood thinner and has not yet resumed. Please advise.

## 2023-12-03 NOTE — Telephone Encounter (Signed)
 I tried reaching patient to discuss the reason to undergo push enteroscopy.  Discussed in detail that the purpose of this was to evaluate possible small bowel polyp found in capsule endoscopy.  We will recheck her iron stores and CBC at the time of enteroscopy as well.  Patient is in agreement and will proceed with this procedure.

## 2023-12-03 NOTE — Telephone Encounter (Signed)
 Noted, Thanks

## 2023-12-07 ENCOUNTER — Other Ambulatory Visit: Payer: Self-pay | Admitting: Cardiology

## 2023-12-07 DIAGNOSIS — E1129 Type 2 diabetes mellitus with other diabetic kidney complication: Secondary | ICD-10-CM | POA: Diagnosis not present

## 2023-12-07 DIAGNOSIS — N1832 Chronic kidney disease, stage 3b: Secondary | ICD-10-CM | POA: Diagnosis not present

## 2023-12-07 DIAGNOSIS — K922 Gastrointestinal hemorrhage, unspecified: Secondary | ICD-10-CM | POA: Diagnosis not present

## 2023-12-07 DIAGNOSIS — Z79899 Other long term (current) drug therapy: Secondary | ICD-10-CM | POA: Diagnosis not present

## 2023-12-17 DIAGNOSIS — Z23 Encounter for immunization: Secondary | ICD-10-CM | POA: Diagnosis not present

## 2023-12-17 DIAGNOSIS — E1129 Type 2 diabetes mellitus with other diabetic kidney complication: Secondary | ICD-10-CM | POA: Diagnosis not present

## 2023-12-17 DIAGNOSIS — D649 Anemia, unspecified: Secondary | ICD-10-CM | POA: Diagnosis not present

## 2023-12-17 DIAGNOSIS — N183 Chronic kidney disease, stage 3 unspecified: Secondary | ICD-10-CM | POA: Diagnosis not present

## 2023-12-25 DIAGNOSIS — E1129 Type 2 diabetes mellitus with other diabetic kidney complication: Secondary | ICD-10-CM | POA: Diagnosis not present

## 2023-12-25 DIAGNOSIS — Z79899 Other long term (current) drug therapy: Secondary | ICD-10-CM | POA: Diagnosis not present

## 2023-12-26 ENCOUNTER — Encounter: Payer: Self-pay | Admitting: *Deleted

## 2023-12-26 NOTE — Progress Notes (Signed)
 Kim Dixon                                          MRN: [id number]   12/26/2023   The [facility] Quality Team Specialist reviewed this patient medical record for the purposes of chart review for care gap closure. The following were reviewed: chart review for care gap closure-kidney health evaluation for diabetes:eGFR  and uACR.    [facility] Quality Team

## 2024-01-01 DIAGNOSIS — H401133 Primary open-angle glaucoma, bilateral, severe stage: Secondary | ICD-10-CM | POA: Diagnosis not present

## 2024-01-01 DIAGNOSIS — H26492 Other secondary cataract, left eye: Secondary | ICD-10-CM | POA: Diagnosis not present

## 2024-01-01 DIAGNOSIS — Z961 Presence of intraocular lens: Secondary | ICD-10-CM | POA: Diagnosis not present

## 2024-01-02 ENCOUNTER — Observation Stay (HOSPITAL_COMMUNITY)

## 2024-01-02 ENCOUNTER — Encounter: Payer: Self-pay | Admitting: Internal Medicine

## 2024-01-02 ENCOUNTER — Other Ambulatory Visit: Payer: Self-pay

## 2024-01-02 ENCOUNTER — Ambulatory Visit: Admitting: Internal Medicine

## 2024-01-02 ENCOUNTER — Encounter (HOSPITAL_COMMUNITY): Payer: Self-pay

## 2024-01-02 ENCOUNTER — Inpatient Hospital Stay (HOSPITAL_COMMUNITY)
Admission: EM | Admit: 2024-01-02 | Discharge: 2024-01-11 | DRG: 242 | Disposition: A | Discharge status: Home Health | Attending: Family Medicine | Admitting: Family Medicine

## 2024-01-02 ENCOUNTER — Encounter (INDEPENDENT_AMBULATORY_CARE_PROVIDER_SITE_OTHER): Payer: Self-pay | Admitting: Gastroenterology

## 2024-01-02 VITALS — BP 81/41 | HR 39 | Ht 63.0 in | Wt 265.0 lb

## 2024-01-02 DIAGNOSIS — N179 Acute kidney failure, unspecified: Secondary | ICD-10-CM | POA: Diagnosis present

## 2024-01-02 DIAGNOSIS — J4530 Mild persistent asthma, uncomplicated: Secondary | ICD-10-CM | POA: Diagnosis present

## 2024-01-02 DIAGNOSIS — G40909 Epilepsy, unspecified, not intractable, without status epilepticus: Secondary | ICD-10-CM | POA: Diagnosis present

## 2024-01-02 DIAGNOSIS — E039 Hypothyroidism, unspecified: Secondary | ICD-10-CM | POA: Diagnosis present

## 2024-01-02 DIAGNOSIS — Z8249 Family history of ischemic heart disease and other diseases of the circulatory system: Secondary | ICD-10-CM | POA: Diagnosis not present

## 2024-01-02 DIAGNOSIS — J4489 Other specified chronic obstructive pulmonary disease: Secondary | ICD-10-CM | POA: Diagnosis present

## 2024-01-02 DIAGNOSIS — R001 Bradycardia, unspecified: Principal | ICD-10-CM | POA: Diagnosis present

## 2024-01-02 DIAGNOSIS — Z79890 Hormone replacement therapy (postmenopausal): Secondary | ICD-10-CM | POA: Diagnosis not present

## 2024-01-02 DIAGNOSIS — Z8673 Personal history of transient ischemic attack (TIA), and cerebral infarction without residual deficits: Secondary | ICD-10-CM | POA: Diagnosis not present

## 2024-01-02 DIAGNOSIS — I495 Sick sinus syndrome: Secondary | ICD-10-CM | POA: Diagnosis present

## 2024-01-02 DIAGNOSIS — R531 Weakness: Secondary | ICD-10-CM | POA: Diagnosis not present

## 2024-01-02 DIAGNOSIS — I959 Hypotension, unspecified: Secondary | ICD-10-CM | POA: Diagnosis not present

## 2024-01-02 DIAGNOSIS — R0902 Hypoxemia: Secondary | ICD-10-CM | POA: Diagnosis not present

## 2024-01-02 DIAGNOSIS — J90 Pleural effusion, not elsewhere classified: Secondary | ICD-10-CM | POA: Diagnosis not present

## 2024-01-02 DIAGNOSIS — R0989 Other specified symptoms and signs involving the circulatory and respiratory systems: Secondary | ICD-10-CM | POA: Diagnosis not present

## 2024-01-02 DIAGNOSIS — I480 Paroxysmal atrial fibrillation: Secondary | ICD-10-CM | POA: Diagnosis present

## 2024-01-02 DIAGNOSIS — N189 Chronic kidney disease, unspecified: Secondary | ICD-10-CM | POA: Diagnosis present

## 2024-01-02 DIAGNOSIS — Z79899 Other long term (current) drug therapy: Secondary | ICD-10-CM | POA: Diagnosis not present

## 2024-01-02 DIAGNOSIS — I130 Hypertensive heart and chronic kidney disease with heart failure and stage 1 through stage 4 chronic kidney disease, or unspecified chronic kidney disease: Secondary | ICD-10-CM | POA: Diagnosis present

## 2024-01-02 DIAGNOSIS — I517 Cardiomegaly: Secondary | ICD-10-CM | POA: Diagnosis not present

## 2024-01-02 DIAGNOSIS — G4733 Obstructive sleep apnea (adult) (pediatric): Secondary | ICD-10-CM | POA: Diagnosis present

## 2024-01-02 DIAGNOSIS — Z87891 Personal history of nicotine dependence: Secondary | ICD-10-CM

## 2024-01-02 DIAGNOSIS — Z950 Presence of cardiac pacemaker: Secondary | ICD-10-CM | POA: Diagnosis not present

## 2024-01-02 DIAGNOSIS — Z85828 Personal history of other malignant neoplasm of skin: Secondary | ICD-10-CM

## 2024-01-02 DIAGNOSIS — Z96652 Presence of left artificial knee joint: Secondary | ICD-10-CM | POA: Diagnosis present

## 2024-01-02 DIAGNOSIS — I4892 Unspecified atrial flutter: Secondary | ICD-10-CM | POA: Diagnosis present

## 2024-01-02 DIAGNOSIS — Z6841 Body Mass Index (BMI) 40.0 and over, adult: Secondary | ICD-10-CM | POA: Diagnosis not present

## 2024-01-02 DIAGNOSIS — I4891 Unspecified atrial fibrillation: Secondary | ICD-10-CM | POA: Diagnosis not present

## 2024-01-02 DIAGNOSIS — E785 Hyperlipidemia, unspecified: Secondary | ICD-10-CM | POA: Diagnosis present

## 2024-01-02 DIAGNOSIS — D649 Anemia, unspecified: Secondary | ICD-10-CM | POA: Diagnosis present

## 2024-01-02 DIAGNOSIS — E66813 Obesity, class 3: Secondary | ICD-10-CM | POA: Diagnosis present

## 2024-01-02 DIAGNOSIS — I10 Essential (primary) hypertension: Secondary | ICD-10-CM | POA: Diagnosis present

## 2024-01-02 DIAGNOSIS — I5030 Unspecified diastolic (congestive) heart failure: Secondary | ICD-10-CM

## 2024-01-02 DIAGNOSIS — I5033 Acute on chronic diastolic (congestive) heart failure: Secondary | ICD-10-CM | POA: Diagnosis not present

## 2024-01-02 DIAGNOSIS — E1122 Type 2 diabetes mellitus with diabetic chronic kidney disease: Secondary | ICD-10-CM | POA: Diagnosis present

## 2024-01-02 DIAGNOSIS — Z8701 Personal history of pneumonia (recurrent): Secondary | ICD-10-CM

## 2024-01-02 DIAGNOSIS — Z743 Need for continuous supervision: Secondary | ICD-10-CM | POA: Diagnosis not present

## 2024-01-02 DIAGNOSIS — D6869 Other thrombophilia: Secondary | ICD-10-CM | POA: Diagnosis not present

## 2024-01-02 DIAGNOSIS — R918 Other nonspecific abnormal finding of lung field: Secondary | ICD-10-CM | POA: Diagnosis not present

## 2024-01-02 DIAGNOSIS — I4510 Unspecified right bundle-branch block: Secondary | ICD-10-CM | POA: Diagnosis present

## 2024-01-02 DIAGNOSIS — R0609 Other forms of dyspnea: Secondary | ICD-10-CM

## 2024-01-02 LAB — CBC WITH DIFFERENTIAL/PLATELET
Abs Immature Granulocytes: 0.01 K/uL (ref 0.00–0.07)
Basophils Absolute: 0.1 K/uL (ref 0.0–0.1)
Basophils Relative: 1 %
Eosinophils Absolute: 0.1 K/uL (ref 0.0–0.5)
Eosinophils Relative: 3 %
HCT: 36.6 % (ref 36.0–46.0)
Hemoglobin: 10.6 g/dL — ABNORMAL LOW (ref 12.0–15.0)
Immature Granulocytes: 0 %
Lymphocytes Relative: 25 %
Lymphs Abs: 1.2 K/uL (ref 0.7–4.0)
MCH: 23.7 pg — ABNORMAL LOW (ref 26.0–34.0)
MCHC: 29 g/dL — ABNORMAL LOW (ref 30.0–36.0)
MCV: 81.7 fL (ref 80.0–100.0)
Monocytes Absolute: 0.5 K/uL (ref 0.1–1.0)
Monocytes Relative: 10 %
Neutro Abs: 2.9 K/uL (ref 1.7–7.7)
Neutrophils Relative %: 61 %
Platelets: 183 K/uL (ref 150–400)
RBC: 4.48 MIL/uL (ref 3.87–5.11)
RDW: 22.9 % — ABNORMAL HIGH (ref 11.5–15.5)
Smear Review: NORMAL
WBC: 4.8 K/uL (ref 4.0–10.5)
nRBC: 0 % (ref 0.0–0.2)

## 2024-01-02 LAB — BASIC METABOLIC PANEL WITH GFR
Anion gap: 13 (ref 5–15)
BUN: 19 mg/dL (ref 8–23)
CO2: 27 mmol/L (ref 22–32)
Calcium: 8.7 mg/dL — ABNORMAL LOW (ref 8.9–10.3)
Chloride: 100 mmol/L (ref 98–111)
Creatinine, Ser: 1.81 mg/dL — ABNORMAL HIGH (ref 0.44–1.00)
GFR, Estimated: 27 mL/min — ABNORMAL LOW (ref >60)
Glucose, Bld: 121 mg/dL — ABNORMAL HIGH (ref 70–99)
Potassium: 4.3 mmol/L (ref 3.5–5.1)
Sodium: 140 mmol/L (ref 135–145)

## 2024-01-02 LAB — CBG MONITORING, ED: Glucose-Capillary: 120 mg/dL — ABNORMAL HIGH (ref 70–99)

## 2024-01-02 LAB — HEPATIC FUNCTION PANEL
ALT: 9 U/L (ref 0–44)
AST: 19 U/L (ref 15–41)
Albumin: 4.3 g/dL (ref 3.5–5.0)
Alkaline Phosphatase: 57 U/L (ref 38–126)
Bilirubin, Direct: 0.3 mg/dL — ABNORMAL HIGH (ref 0.0–0.2)
Indirect Bilirubin: 0.4 mg/dL (ref 0.3–0.9)
Total Bilirubin: 0.7 mg/dL (ref 0.0–1.2)
Total Protein: 7.5 g/dL (ref 6.5–8.1)

## 2024-01-02 LAB — TROPONIN T, HIGH SENSITIVITY
Troponin T High Sensitivity: 21 ng/L — ABNORMAL HIGH (ref 0–19)
Troponin T High Sensitivity: 20 ng/L — ABNORMAL HIGH (ref 0–19)

## 2024-01-02 LAB — LACTIC ACID, PLASMA
Lactic Acid, Venous: 2 mmol/L (ref 0.5–1.9)
Lactic Acid, Venous: 1.4 mmol/L (ref 0.5–1.9)

## 2024-01-02 LAB — PRO BRAIN NATRIURETIC PEPTIDE: Pro Brain Natriuretic Peptide: 552 pg/mL — ABNORMAL HIGH (ref <300.0)

## 2024-01-02 LAB — MAGNESIUM: Magnesium: 2.6 mg/dL — ABNORMAL HIGH (ref 1.7–2.4)

## 2024-01-02 LAB — TSH: TSH: 78.2 u[IU]/mL — ABNORMAL HIGH (ref 0.350–4.500)

## 2024-01-02 LAB — T4, FREE: Free T4: 0.35 ng/dL — ABNORMAL LOW (ref 0.61–1.12)

## 2024-01-02 MED ORDER — ATROPINE SULFATE 1 MG/ML IV SOLN
0.5000 mg | Freq: Once | INTRAVENOUS | Status: AC
  Administered 2024-01-02: 0.5 mg via INTRAVENOUS
  Filled 2024-01-02: qty 1

## 2024-01-02 MED ORDER — FLUTICASONE FUROATE-VILANTEROL 100-25 MCG/ACT IN AEPB
1.0000 | INHALATION_SPRAY | Freq: Every evening | RESPIRATORY_TRACT | Status: DC
  Administered 2024-01-02 – 2024-01-11 (×9): 1 via RESPIRATORY_TRACT
  Filled 2024-01-02: qty 28

## 2024-01-02 MED ORDER — LACTATED RINGERS IV BOLUS
250.0000 mL | Freq: Once | INTRAVENOUS | Status: AC
  Administered 2024-01-02: 250 mL via INTRAVENOUS

## 2024-01-02 MED ORDER — SODIUM CHLORIDE 0.9 % IV SOLN: INTRAVENOUS | Status: AC

## 2024-01-02 MED ORDER — ONDANSETRON HCL 4 MG PO TABS: 4.0000 mg | ORAL_TABLET | Freq: Four times a day (QID) | ORAL | Status: DC | PRN

## 2024-01-02 MED ORDER — LEVOTHYROXINE SODIUM 50 MCG PO TABS
50.0000 ug | ORAL_TABLET | Freq: Every day | ORAL | Status: DC
Start: 2024-01-02 — End: 2024-01-03
  Administered 2024-01-03: 50 ug via ORAL
  Filled 2024-01-02: qty 1

## 2024-01-02 MED ORDER — ACETAMINOPHEN 650 MG RE SUPP: 650.0000 mg | Freq: Four times a day (QID) | RECTAL | Status: DC | PRN

## 2024-01-02 MED ORDER — ACETAMINOPHEN 325 MG PO TABS: 650.0000 mg | ORAL_TABLET | Freq: Four times a day (QID) | ORAL | Status: DC | PRN

## 2024-01-02 MED ORDER — ONDANSETRON HCL 4 MG/2ML IJ SOLN: 4.0000 mg | Freq: Four times a day (QID) | INTRAMUSCULAR | Status: DC | PRN

## 2024-01-02 MED ORDER — LEVETIRACETAM 250 MG PO TABS
250.0000 mg | ORAL_TABLET | Freq: Two times a day (BID) | ORAL | Status: DC
  Administered 2024-01-02 – 2024-01-11 (×18): 250 mg via ORAL
  Filled 2024-01-02 (×19): qty 1

## 2024-01-02 MED ORDER — LACTATED RINGERS IV SOLN: INTRAVENOUS | Status: AC

## 2024-01-02 MED ORDER — POLYETHYLENE GLYCOL 3350 17 G PO PACK: 17.0000 g | PACK | Freq: Every day | ORAL | Status: DC | PRN

## 2024-01-02 MED ORDER — ENOXAPARIN SODIUM 40 MG/0.4ML IJ SOSY
40.0000 mg | PREFILLED_SYRINGE | INTRAMUSCULAR | Status: DC
  Administered 2024-01-02 – 2024-01-07 (×6): 40 mg via SUBCUTANEOUS
  Filled 2024-01-02 (×6): qty 0.4

## 2024-01-02 MED ORDER — LACTATED RINGERS IV BOLUS: 500.0000 mL | Freq: Once | INTRAVENOUS | Status: DC

## 2024-01-02 NOTE — Assessment & Plan Note (Addendum)
 Onset in childhood   Despite use of lopressor  and timoptic she is not wheezing at all on BREO 100 so should continue this daily with approp saba:  Re SABA :  I spent extra time with pt today reviewing appropriate use of albuterol  for prn use on exertion with the following points: 1) saba is for relief of sob that does not improve by walking a slower pace or resting but rather if the pt does not improve after trying this first. 2) If the pt is convinced, as many are, that saba helps recover from activity faster then it's easy to tell if this is the case by re-challenging : ie stop, take the inhaler, then p 5 minutes try the exact same activity (intensity of workload) that just caused the symptoms and see if they are substantially diminished or not after saba 3) if there is an activity that reproducibly causes the symptoms, try the saba 15 min before the activity on alternate days   If in fact the saba really does help, then fine to continue to use it prn but advised may need to look closer at the maintenance regimen being used to achieve better control of airways disease with exertion.   >>> longterm needs to monitor sats with walking to detect lung vs cardiac doe and f/u with me if losing ground with either indicator (desats or doe)

## 2024-01-02 NOTE — Progress Notes (Addendum)
 Kim Dixon, female    DOB: 08/05/1940    MRN: [id number]   Brief patient profile:  35  yowf  quit smoker 1995  former [facility] Pt self-referred back to pulmonary clinic in [place]  01/02/2024  for asthma f/u    PFT 07/06/21 >> FEV1 1.23 (64%), ratio 0.83, TLC 4.19 (84%), DLCO 105%, +BD FeNO 06/05/22 >> 39   History of Present Illness  01/02/2024  Pulmonary/ 1st office eval/ Kim Dixon / [facility] / Jul 25 2023  Chief Complaint  Patient presents with   Shortness of Breath    Doe - congestion w/ white mucus - coughing in am  Dyspnea:  still shops at [employer] Cough: none  Sleep: flat bed one pillow  SABA use: twice daily  02 ldz:wnwz     No obvious day to day or daytime pattern/variability or assoc excess/ purulent sputum or mucus plugs or hemoptysis or cp or chest tightness, subjective wheeze or overt sinus or hb symptoms.    Also denies any obvious fluctuation of symptoms with weather or environmental changes or other aggravating or alleviating factors except as outlined above   No unusual exposure hx or h/o childhood pna/ asthma or knowledge of premature birth.  Current Allergies, Complete Past Medical History, Past Surgical History, Family History, and Social History were reviewed in [employer] record.  ROS  The following are not active complaints unless bolded Hoarseness, sore throat, dysphagia, dental problems, itching, sneezing,  nasal congestion or discharge of excess mucus or purulent secretions, ear ache,   fever, chills, sweats, unintended wt loss or wt gain, classically pleuritic or exertional cp,  orthopnea pnd or arm/hand swelling  or leg swelling, presyncope, palpitations, abdominal pain, anorexia, nausea, vomiting, diarrhea  or change in bowel habits or change in bladder habits, change in stools or change in urine, dysuria, hematuria,  rash, arthralgias, visual complaints, headache, numbness, weakness or ataxia or problems  with walking or coordination,  change in mood or  memory. Pos viz/auditory hallucinations            Outpatient Medications Prior to Visit  Medication Sig Dispense Refill   albuterol  (VENTOLIN  HFA) 108 (90 Base) MCG/ACT inhaler Inhale 2 puffs into the lungs every 6 (six) hours as needed for wheezing or shortness of breath. 8 g 2   amiodarone  (PACERONE ) 200 MG tablet Take 0.5 tablets (100 mg total) by mouth daily. 45 tablet 3   Ascorbic Acid (VITAMIN C) 1000 MG tablet Take 3,000 mg by mouth daily.      atorvastatin (LIPITOR) 10 MG tablet Take 10 mg by mouth every Monday, Wednesday, and Friday at 8 PM.      bimatoprost (LUMIGAN) 0.01 % SOLN Place 1 drop into both eyes at bedtime.     Cyanocobalamin  (VITAMIN B-12 SL) Place 1 tablet under the tongue daily.     escitalopram (LEXAPRO) 10 MG tablet Take 10 mg by mouth daily.     fluticasone  furoate-vilanterol (BREO ELLIPTA ) 100-25 MCG/ACT AEPB Inhale 1 puff into the lungs daily. 30 each 5   levETIRAcetam (KEPPRA) 500 MG tablet Take 250 mg by mouth 2 (two) times daily.     losartan  (COZAAR ) 25 MG tablet Take 1 tablet (25 mg total) by mouth daily. 90 tablet 3   Menthol , Topical Analgesic, (BIOFREEZE ROLL-ON EX) Apply 1 application topically 3 (three) times daily as needed (back pain.).     metoprolol  tartrate (LOPRESSOR ) 25 MG tablet Take 1 tablet (25  mg total) by mouth 2 (two) times daily.     omeprazole  (PRILOSEC) 40 MG capsule Take 1 capsule (40 mg total) by mouth 2 (two) times daily. 180 capsule 1   potassium chloride  SA (KLOR-CON  M) 20 MEQ tablet TAKE ONE TABLET ( TOTAL) BY MOUTH DAILY 90 tablet 3   timolol (TIMOPTIC) 0.5 % ophthalmic solution Place 1 drop into both eyes daily.     torsemide  (DEMADEX ) 20 MG tablet Take 1 tablet (20 mg total) by mouth daily. 90 tablet 3   Vitamin D3 (VITAMIN D) 25 MCG tablet Take 1,000 Units by mouth daily.     Facility-Administered Medications Prior to Visit  Medication Dose Route Frequency Provider Last  Rate Last Admin   sodium chloride  flush (NS) 0.9 % injection 3 mL  3 mL Intravenous Q12H Debera Jayson MATSU, MD        Past Medical History:  Diagnosis Date   Anxiety    Arthritis    Basal cell carcinoma    RIGHT THIGH BCC CX3 5FU   Basal cell carcinoma 12/16/2019   right anterior neck (CX35FU)   Depression    Diverticulosis    Essential hypertension    Glaucoma    Hematuria    History of kidney stones    History of pneumonia    Hyperlipidemia    OSA (obstructive sleep apnea) 10/30/2022   PAF (paroxysmal atrial fibrillation) (HCC)    Pneumonia    SCC (squamous cell carcinoma) 2023   left forearm inf. tx cx3 37fu   Squamous cell carcinoma of skin 2023   left forearm sup tx cx3 40fu   Stress incontinence    TIA (transient ischemic attack)    January 2020   Urinary frequency    Varicose veins       Objective:     BP (!) 81/41   Pulse (!) 39   Ht 5' 3 (1.6 m)   Wt 265 lb (120.2 kg)   SpO2 91% Comment: ra  BMI 46.94 kg/m   SpO2: 91 % (ra)  MO  (by BMI) wf walks with cane  - bp and pulse confirmed   HEENT : Oropharynx  clear         NECK :  without  apparent JVD/ palpable Nodes/TM    LUNGS: no acc muscle use,  Nl contour chest which is clear to A and P bilaterally without cough on insp or exp maneuvers   CV:  RRR  no s3 or murmur or increase in P2, and no edema   ABD:  soft and nontender   MS:  gait unsteady walks with cane/   ext warm without deformities Or obvious joint restrictions  calf tenderness, cyanosis or clubbing    SKIN: warm and dry without lesions    NEURO:  alert, approp, nl sensorium with  no motor or cerebellar deficits apparent.       Assessment   Assessment & Plan DOE (dyspnea on exertion)  Mild persistent asthma without complication Onset in childhood   Despite use of lopressor  and timoptic she is not wheezing at all on BREO 100 so should continue this daily with approp saba:  Re SABA :  I spent extra time with pt today  reviewing appropriate use of albuterol  for prn use on exertion with the following points: 1) saba is for relief of sob that does not improve by walking a slower pace or resting but rather if the pt does not improve after trying this first.  2) If the pt is convinced, as many are, that saba helps recover from activity faster then it's easy to tell if this is the case by re-challenging : ie stop, take the inhaler, then p 5 minutes try the exact same activity (intensity of workload) that just caused the symptoms and see if they are substantially diminished or not after saba 3) if there is an activity that reproducibly causes the symptoms, try the saba 15 min before the activity on alternate days   If in fact the saba really does help, then fine to continue to use it prn but advised may need to look closer at the maintenance regimen being used to achieve better control of airways disease with exertion.   >>> longterm needs to monitor sats with walking to detect lung vs cardiac doe and f/u with me if losing ground with either indicator (desats or doe)   Atrial fibrillation, unspecified type (HCC) Both pulse and bp way too low on lopressor  and amiodarone  and she may be getting confused and taking more than she should be will need to go to ER for labs/ fluids and drug holiday until sort this out    >>>   Spoke to [place] RN > ok to go to ER by personal vehicle per daugher  Discussed in detail all the  indications, usual  risks and alternatives  relative to the benefits with patient who agrees to proceed with wu and Rx as outlined.       F/u  prn refills on asthma meds         Each maintenance medication was reviewed in detail including emphasizing most importantly the difference between maintenance and prns and under what circumstances the prns are to be triggered using an action plan format where appropriate.  Total time for H and P, chart review, counseling, reviewing hfa/ dpi/ pulse ox device(s)  and generating customized AVS unique to this office visit / same day charting = 35 min with pt new to me         AVS  Patient Instructions  Go to the [facility] ER because your blood pressure and pulse are both too low   Ozell America, MD 01/02/2024

## 2024-01-02 NOTE — Assessment & Plan Note (Addendum)
 Both pulse and bp way too low on lopressor  and amiodarone  and she may be getting confused and taking more than she should be will need to go to ER for labs/ fluids and drug holiday until sort this out    >>>   Spoke to [place] RN > ok to go to ER by personal vehicle per daugher  Discussed in detail all the  indications, usual  risks and alternatives  relative to the benefits with patient who agrees to proceed with wu and Rx as outlined.

## 2024-01-02 NOTE — Patient Instructions (Signed)
 Go to the [facility] ER because your blood pressure and pulse are both too low

## 2024-01-02 NOTE — ED Triage Notes (Signed)
 Pt arrived via POV following a pulmonology appointment with Dr [name] who advised the Pt to come to the ER for evaluation of atrial fibrillation, low HR, low BP, and may need fluids and lab work done.  Reference note in Pts Chart from Dr Leisa Office.

## 2024-01-02 NOTE — H&P (Addendum)
 History and Physical    Kim Dixon FMW:[id number] DOB: 02-Mar-1941 DOA: 01/02/2024  PCP: Sheryle Carwin, MD   Patient coming from: Home  I have personally briefly reviewed patient's old medical records in [facility]  Chief Complaint: Low heart rate and blood pressure  HPI: Kim Dixon is a 83 y.o. female with medical history significant for atrial fibrillation, hypertension, asthma, depression, OSA. Patient was sent to the ED from Dr. Chari (pulmonologist) office reports of low heart rate and low blood pressure.  Patient reports over the past 2 months, she has had increasing fatigue, increased somnolence, sleeping for several hours at a time, difficulty breathing with exertion, and some chest tightness when she is short of breath otherwise no chest pain.  She reports nearly passing out also. Patient is on metoprolol  and amiodarone , her last dose of metoprolol  was about 12 noon today.  She does not take her medications at a consistent time daily.  She believes she took amiodarone  this morning also.  She does not check her vitals at home.  She reports she saw her outpatient provider Dr. Sheryle about 2 weeks ago, and they were not happy about her vitals.   ED Course: Temperature 97.7.  Heart rate 30s, down to 27 on my evaluation, respirate rate 16, blood pressure on my evaluation ranging from systolic 78-129. Troponin 21.  Potassium 4.3.  Magnesium  2.6.  Lactic acid 2. Creatinine elevated 1.8. BNP 552. 250 LR bolus given. Cardiology consulted recommended admission to [facility].  Review of Systems: As per HPI all other systems reviewed and negative.  Past Medical History:  Diagnosis Date   Anxiety    Arthritis    Basal cell carcinoma    RIGHT THIGH BCC CX3 5FU   Basal cell carcinoma 12/16/2019   right anterior neck (CX35FU)   Depression    Diverticulosis    Essential hypertension    Glaucoma    Hematuria    History of kidney stones    History of pneumonia     Hyperlipidemia    OSA (obstructive sleep apnea) 10/30/2022   PAF (paroxysmal atrial fibrillation) (HCC)    Pneumonia    SCC (squamous cell carcinoma) 2023   left forearm inf. tx cx3 30fu   Squamous cell carcinoma of skin 2023   left forearm sup tx cx3 51fu   Stress incontinence    TIA (transient ischemic attack)    January 2020   Urinary frequency    Varicose veins     Past Surgical History:  Procedure Laterality Date   ABDOMINAL HYSTERECTOMY     ABLATION SAPHENOUS VEIN W/ RFA Bilateral    CATARACT EXTRACTION Bilateral    COLONOSCOPY     COLONOSCOPY N/A 06/18/2013   Procedure: COLONOSCOPY;  Surgeon: Claudis RAYMOND Rivet, MD;  [facility]: [facility];  Service: Endoscopy;  Laterality: N/A;  830   COLONOSCOPY N/A 10/02/2023   Procedure: COLONOSCOPY;  Surgeon: Eartha Angelia Sieving, MD;  Location: [facility];  Service: Gastroenterology;  Laterality: N/A;  10:30 AM, ASA 1/2   CYSTOSCOPY WITH RETROGRADE PYELOGRAM, URETEROSCOPY AND STENT PLACEMENT Left 10/18/2018   Procedure: CYSTOSCOPY WITH RETROGRADE PYELOGRAM, URETEROSCOPY AND STENT PLACEMENT;  Surgeon: Alvaro Hummer, MD;  Location: [facility];  Service: Urology;  Laterality: Left;  1 HR   ESOPHAGOGASTRODUODENOSCOPY N/A 09/19/2023   Procedure: EGD (ESOPHAGOGASTRODUODENOSCOPY);  Surgeon: Eartha Angelia, Sieving, MD;  Location: [facility];  Service: Gastroenterology;  Laterality: N/A;  730am, asa 1-2   EYE SURGERY  laser surgery bilat    GIVENS CAPSULE STUDY N/A 10/23/2023   Procedure: IMAGING PROCEDURE, GI TRACT, INTRALUMINAL, VIA CAPSULE;  Surgeon: Eartha Flavors, Toribio, MD;  Location: [facility];  Service: Gastroenterology;  Laterality: N/A;  730am   HOLMIUM LASER APPLICATION Left 10/18/2018   Procedure: HOLMIUM LASER APPLICATION;  Surgeon: Alvaro Hummer, MD;  Location: [facility];  Service: Urology;  Laterality: Left;   LEFT HEART CATH AND CORONARY ANGIOGRAPHY N/A 11/28/2019   Procedure: LEFT HEART CATH AND CORONARY  ANGIOGRAPHY;  Surgeon: Darron Deatrice LABOR, MD;  Location: [facility];  Service: Cardiovascular;  Laterality: N/A;   LITHOTRIPSY     MASS EXCISION N/A 12/07/2017   Procedure: EXCISION 3CM CYST ON BACK;  Surgeon: Mavis Anes, MD;  Location: [facility];  Service: General;  Laterality: N/A;   PARATHYROIDECTOMY Left 04/22/2019   Procedure: LEFT INFERIOR PARATHYROIDECTOMY;  Surgeon: Eletha Boas, MD;  Location: [facility];  Service: General;  Laterality: Left;   TONSILLECTOMY     TOTAL KNEE ARTHROPLASTY Left 08/25/2014   Procedure: LEFT TOTAL KNEE ARTHROPLASTY;  Surgeon: Donnice Car, MD;  Location: [facility];  Service: Orthopedics;  Laterality: Left;     reports that she quit smoking about 30 years ago. Her smoking use included cigarettes. She started smoking about 60 years ago. She has a 90 pack-year smoking history. She has never used smokeless tobacco. She reports current alcohol use. She reports that she does not use drugs.  Allergies  Allergen Reactions   Naproxen Sodium Other (See Comments)    Tightness in throat    Family History  Problem Relation Age of Onset   Coronary artery disease Mother    Lung cancer Father    Colon cancer Neg Hx    Stomach cancer Neg Hx     Prior to Admission medications   Medication Sig Start Date End Date Taking? Authorizing Provider  acetaminophen  (TYLENOL ) 500 MG tablet Take 1,000 mg by mouth every 6 (six) hours as needed for mild pain (pain score 1-3).   Yes [provider]  albuterol  (VENTOLIN  HFA) 108 (90 Base) MCG/ACT inhaler Inhale 2 puffs into the lungs every 6 (six) hours as needed for wheezing or shortness of breath. 05/17/21  Yes Sood, Vineet, MD  amiodarone  (PACERONE ) 200 MG tablet Take 0.5 tablets (100 mg total) by mouth daily. 10/12/23 10/06/24 Yes Strader, Laymon HERO, PA-C  Ascorbic Acid (VITAMIN C) 1000 MG tablet Take 3,000 mg by mouth daily.    Yes [provider]  atorvastatin (LIPITOR) 10 MG tablet Take 10 mg by mouth every  Monday, Wednesday, and Friday at 8 PM.    Yes [provider]  bimatoprost (LUMIGAN) 0.01 % SOLN Place 1 drop into both eyes at bedtime.   Yes [provider]  calcium carbonate (TUMS - DOSED IN MG ELEMENTAL CALCIUM) 500 MG chewable tablet Chew 1 tablet by mouth daily as needed for indigestion.   Yes [provider]  Cyanocobalamin  (VITAMIN B-12 SL) Place 1 tablet under the tongue daily.   Yes [provider]  escitalopram (LEXAPRO) 10 MG tablet Take 10 mg by mouth daily. 02/08/22  Yes [provider]  fluticasone  furoate-vilanterol (BREO ELLIPTA ) 100-25 MCG/ACT AEPB Inhale 1 puff into the lungs daily. Patient taking differently: Inhale 1 puff into the lungs every evening. 06/05/22  Yes Sood, Vineet, MD  levETIRAcetam (KEPPRA) 500 MG tablet Take 250 mg by mouth 2 (two) times daily. 02/14/21  Yes [provider]  losartan  (COZAAR ) 25  MG tablet Take 1 tablet (25 mg total) by mouth daily. 04/03/23 01/02/24 Yes Strader, Laymon HERO, PA-C  Menthol , Topical Analgesic, (BIOFREEZE ROLL-ON EX) Apply 1 application  topically 2 (two) times daily as needed (back pain).   Yes [provider]  metoprolol  tartrate (LOPRESSOR ) 50 MG tablet Take 50 mg by mouth 2 (two) times daily. 10/17/23  Yes [provider]  omeprazole  (PRILOSEC) 40 MG capsule Take 1 capsule (40 mg total) by mouth 2 (two) times daily. 09/12/23  Yes Eartha Angelia Sieving, MD  potassium chloride  SA (KLOR-CON  M) 20 MEQ tablet TAKE ONE TABLET ( TOTAL) BY MOUTH DAILY Patient taking differently: Take 20 mEq by mouth daily. 12/07/23  Yes Debera Jayson MATSU, MD  timolol (TIMOPTIC) 0.5 % ophthalmic solution Place 1 drop into both eyes daily. 10/23/17  Yes [provider]  torsemide  (DEMADEX ) 20 MG tablet Take 1 tablet (20 mg total) by mouth daily. 10/12/23  Yes Strader, Laymon HERO, PA-C  Vitamin D3 (VITAMIN D) 25 MCG tablet Take 1,000 Units by mouth daily.   Yes [provider]    Physical Exam: Vitals:   01/02/24 1531 01/02/24 1532 01/02/24 1654  BP: (!) 129/104  98/70  Pulse: (!) 30    Resp: 16    Temp: 97.7 F (36.5 C)    TempSrc: Oral    SpO2: 94%    Weight:  120.2 kg   Height:  5' 3 (1.6 m)     Constitutional: NAD, calm, comfortable Vitals:   01/02/24 1531 01/02/24 1532 01/02/24 1654  BP: (!) 129/104  98/70  Pulse: (!) 30    Resp: 16    Temp: 97.7 F (36.5 C)    TempSrc: Oral    SpO2: 94%    Weight:  120.2 kg   Height:  5' 3 (1.6 m)    Eyes: PERRL, lids and conjunctivae normal ENMT: Mucous membranes are moist.   Neck: normal, supple, no masses, no thyromegaly Respiratory: clear to auscultation bilaterally, no wheezing, no crackles. Normal respiratory effort. No accessory muscle use.  Cardiovascular: Bradycardic, regular rate and rhythm, no murmurs / rubs / gallops. No extremity edema.  Extremities warm. Abdomen: no tenderness, no masses palpated. No hepatosplenomegaly. Bowel sounds positive.  Musculoskeletal: no clubbing / cyanosis. No joint deformity upper and lower extremities.  Skin: no rashes, lesions, ulcers. No induration Neurologic: No facial asymmetry, moves extremities spontaneously, speech fluent.  Psychiatric: Normal judgment and insight. Alert and oriented x 3. Normal mood.   Labs on Admission: I have personally reviewed following labs and imaging studies  CBC: Recent Labs  Lab 01/02/24 1557  WBC 4.8  NEUTROABS 2.9  HGB 10.6*  HCT 36.6  MCV 81.7  PLT 183   Basic Metabolic Panel: Recent Labs  Lab 01/02/24 1557  NA 140  K 4.3  CL 100  CO2 27  GLUCOSE 121*  BUN 19  CREATININE 1.81*  CALCIUM 8.7*  MG 2.6*   GFR: Estimated Creatinine Clearance: 29.6 mL/min (A) (by C-G formula based on SCr of 1.81 mg/dL (H)). Liver Function Tests: Recent Labs  Lab 01/02/24 1557  AST 19  ALT 9  ALKPHOS 57  BILITOT 0.7  PROT 7.5  ALBUMIN 4.3   BNP (last 3 results) Recent Labs    01/02/24 1557   PROBNP 552.0*   HbA1C: No results for input(s): HGBA1C in the last 72 hours. CBG: Recent Labs  Lab 01/02/24 1635  GLUCAP 120*   Lipid Profile: No results for input(s): CHOL, HDL,  LDLCALC, TRIG, CHOLHDL, LDLDIRECT in the last 72 hours. Thyroid  Function Tests: Recent Labs    01/02/24 1557  TSH 78.200*   Radiological Exams on Admission: No results found.  EKG: Independently reviewed.  Junctional rhythm, rate 29.  No significant ST or T wave changes from prior.  Assessment/Plan Principal Problem:   Symptomatic bradycardia Active Problems:   Hypotension   Atrial fibrillation (HCC)   Essential hypertension   OSA (obstructive sleep apnea)   Mild persistent asthma   Assessment and Plan:  Symptomatic bradycardia with hypotension-dyspnea on exertion, fatigue, somnolence over the past 2 months.  Heart rates, on my evaluation down to 27.  With blood pressure systolic transiently down to 78.  Patient is on amiodarone  100 mg ??daily ? twice daily and metoprolol  50 mg twice daily.  Last took her medications at about 12 noon today. - Cardiology consulted, admit to [facility], hold metoprolol  amiodarone , follow on telemetry as beta-blocker washes out of system, could try isoproterenol if heart rate declines further with hypotension as bridge to temporary pacing - Obtain chest x-ray- negative for acute abnormality - Total of 500 mL bolus give, cont LR 75cc/hr x 20hrs - TSH- quite elevated at 78.2, last checked 2 months ago -21, not on Synthroid, may be contributing to her bradycardia, and other symptoms, in addition to her heart rate limiting meds - Magnesium -2.6, potassium 4.3.  Troponin 21 - Echocardiogram. - With heart rates down to 27 on my evaluation, 0.5 mg of atropine given, heart rate consistently > 30s now  AKI-creatinine 1.8, baseline 0.9-1.2.  With lactic acid of 2.  Likely due to hypotension. - Fluids per above - Trend lactic acid - Hold torsemide  20 mg daily,  losartan  25 mg daily  Hypothyroidism-TSH markedly elevated at 78.2.  With symptomatic bradycardia, fatigue, somnolence X 2 months.  Awake and alert on my evaluation. - Check T3, free T4 -  Will start Synthroid 50mcg daily  Atrial fibrillation-presenting with marked sinus bradycardia. - Hold amiodarone  and metoprolol  - Anticoagulation due to GI bleed  Hypertension-currently hypotensive - Hold losartan , metoprolol , torsemide   Diastolic CHF-stable and compensated.  Last echo 09/2019 EF 60 to 65%. - Hold torsemide  with hypotension   DVT prophylaxis: Lovenox Code Status: FULL code-confirmed with patient and daughter at bedside Family Communication: Daughter at bedside Disposition Plan: ~ 2 days Consults called: CArdiology Admission status: Inpt Stepdown I certify that at the point of admission it is my clinical judgment that the patient will require inpatient hospital care spanning beyond 2 midnights from the point of admission due to high intensity of service, high risk for further deterioration and high frequency of surveillance required.   CRITICAL CARE Performed by: Kim Dixon   Total critical care time: 70 minutes  Critical care time was exclusive of separately billable procedures and treating other patients.  Critical care was necessary to treat or prevent imminent or life-threatening deterioration.  Critical care was time spent personally by me on the following activities: development of treatment plan with patient and/or surrogate as well as nursing, discussions with consultants, evaluation of patient's response to treatment, examination of patient, obtaining history from patient or surrogate, ordering and performing treatments and interventions, ordering and review of laboratory studies, ordering and review of radiographic studies, pulse oximetry and re-evaluation of patient's condition.    Author: Tully FORBES Carwin, MD 01/02/2024 7:35 PM  For on call review  [email or web address].

## 2024-01-02 NOTE — Consult Note (Signed)
 Cardiology Consultation   Patient ID: KIELEE CARE MRN: [id number]; DOB: April 06, 1940  Admit date: 01/02/2024 Date of Consult: 01/02/2024  PCP:  Sheryle Carwin, MD   [facility] Providers Cardiologist:  Jayson Sierras, MD    Patient Profile: Kim Dixon is a 83 y.o. female with a hx of PAF who is being seen 01/02/2024 for the evaluation of bradycardia  at the request of Dr Melvenia  History of Present Illness: Kim Dixon is an 83 yo with hxo of PAF, HTN, HL, HFpEF, COPD, OSA 2021  LHC normal May 2025 found to be in atrial flutter   Started amiodarone .  Taking Eliquis  initially daily, not bid 07/2123  In NSR  Bradycardic   Lopressor  decreased to 25 bid     June 2025  Tarry stools   Hgb 8.24 September 2023  EGD normal Colonoscopy showed  multiple polyps and diverticulosis.   Hemorrhoids   Capsule endoscopy also done  Pt remains off of Eliquis    Seen in cardiology clinic in July 2025 by B Strader  Complained of dyspnea on exertion   Today seen  in pulmonary clinic for DOE   Found to be bradycardic and hypotensive    Sent to ER   QUestion if confusion in meds   The pt denies CP  No dizziness   Says her medicine doses has been changed   Does not think she took incorrectly   Wants to go home    Past Medical History:  Diagnosis Date   Anxiety    Arthritis    Basal cell carcinoma    RIGHT THIGH BCC CX3 5FU   Basal cell carcinoma 12/16/2019   right anterior neck (CX35FU)   Depression    Diverticulosis    Essential hypertension    Glaucoma    Hematuria    History of kidney stones    History of pneumonia    Hyperlipidemia    OSA (obstructive sleep apnea) 10/30/2022   PAF (paroxysmal atrial fibrillation) (HCC)    Pneumonia    SCC (squamous cell carcinoma) 2023   left forearm inf. tx cx3 69fu   Squamous cell carcinoma of skin 2023   left forearm sup tx cx3 79fu   Stress incontinence    TIA (transient ischemic attack)    January 2020   Urinary frequency     Varicose veins     Past Surgical History:  Procedure Laterality Date   ABDOMINAL HYSTERECTOMY     ABLATION SAPHENOUS VEIN W/ RFA Bilateral    CATARACT EXTRACTION Bilateral    COLONOSCOPY     COLONOSCOPY N/A 06/18/2013   Procedure: COLONOSCOPY;  Surgeon: Claudis RAYMOND Rivet, MD;  [facility]: [facility];  Service: Endoscopy;  Laterality: N/A;  830   COLONOSCOPY N/A 10/02/2023   Procedure: COLONOSCOPY;  Surgeon: Eartha Angelia Sieving, MD;  Location: [facility];  Service: Gastroenterology;  Laterality: N/A;  10:30 AM, ASA 1/2   CYSTOSCOPY WITH RETROGRADE PYELOGRAM, URETEROSCOPY AND STENT PLACEMENT Left 10/18/2018   Procedure: CYSTOSCOPY WITH RETROGRADE PYELOGRAM, URETEROSCOPY AND STENT PLACEMENT;  Surgeon: Alvaro Hummer, MD;  Location: [facility];  Service: Urology;  Laterality: Left;  1 HR   ESOPHAGOGASTRODUODENOSCOPY N/A 09/19/2023   Procedure: EGD (ESOPHAGOGASTRODUODENOSCOPY);  Surgeon: Eartha Angelia, Sieving, MD;  [facility]: [facility];  Service: Gastroenterology;  Laterality: N/A;  730am, asa 1-2   EYE SURGERY     laser surgery bilat    GIVENS CAPSULE STUDY N/A 10/23/2023   Procedure: IMAGING  PROCEDURE, GI TRACT, INTRALUMINAL, VIA CAPSULE;  Surgeon: Eartha Flavors, Toribio, MD;  Location: [facility];  Service: Gastroenterology;  Laterality: N/A;  730am   HOLMIUM LASER APPLICATION Left 10/18/2018   Procedure: HOLMIUM LASER APPLICATION;  Surgeon: Alvaro Hummer, MD;  Location: [facility];  Service: Urology;  Laterality: Left;   LEFT HEART CATH AND CORONARY ANGIOGRAPHY N/A 11/28/2019   Procedure: LEFT HEART CATH AND CORONARY ANGIOGRAPHY;  Surgeon: Darron Deatrice LABOR, MD;  Location: [facility];  Service: Cardiovascular;  Laterality: N/A;   LITHOTRIPSY     MASS EXCISION N/A 12/07/2017   Procedure: EXCISION 3CM CYST ON BACK;  Surgeon: Mavis Anes, MD;  Location: [facility];  Service: General;  Laterality: N/A;   PARATHYROIDECTOMY Left 04/22/2019   Procedure: LEFT INFERIOR  PARATHYROIDECTOMY;  Surgeon: Eletha Boas, MD;  Location: [facility];  Service: General;  Laterality: Left;   TONSILLECTOMY     TOTAL KNEE ARTHROPLASTY Left 08/25/2014   Procedure: LEFT TOTAL KNEE ARTHROPLASTY;  Surgeon: Donnice Car, MD;  Location: [facility];  Service: Orthopedics;  Laterality: Left;     Home Medications:  Prior to Admission medications   Medication Sig Start Date End Date Taking? Authorizing Provider  albuterol  (VENTOLIN  HFA) 108 (90 Base) MCG/ACT inhaler Inhale 2 puffs into the lungs every 6 (six) hours as needed for wheezing or shortness of breath. 05/17/21   Sood, Vineet, MD  amiodarone  (PACERONE ) 200 MG tablet Take 0.5 tablets (100 mg total) by mouth daily. 10/12/23 10/06/24  Johnson Laymon HERO, PA-C  Ascorbic Acid (VITAMIN C) 1000 MG tablet Take 3,000 mg by mouth daily.     [provider]  atorvastatin (LIPITOR) 10 MG tablet Take 10 mg by mouth every Monday, Wednesday, and Friday at 8 PM.     [provider]  bimatoprost (LUMIGAN) 0.01 % SOLN Place 1 drop into both eyes at bedtime.    [provider]  Cyanocobalamin  (VITAMIN B-12 SL) Place 1 tablet under the tongue daily.    [provider]  escitalopram (LEXAPRO) 10 MG tablet Take 10 mg by mouth daily. 02/08/22   [provider]  fluticasone  furoate-vilanterol (BREO ELLIPTA ) 100-25 MCG/ACT AEPB Inhale 1 puff into the lungs daily. 06/05/22   Sood, Vineet, MD  levETIRAcetam (KEPPRA) 500 MG tablet Take 250 mg by mouth 2 (two) times daily. 02/14/21   [provider]  losartan  (COZAAR ) 25 MG tablet Take 1 tablet (25 mg total) by mouth daily. 04/03/23 10/11/23  Strader, Brittany M, PA-C  Menthol , Topical Analgesic, (BIOFREEZE ROLL-ON EX) Apply 1 application topically 3 (three) times daily as needed (back pain.).    [provider]  metoprolol  tartrate (LOPRESSOR ) 50 MG tablet Take 50 mg by mouth 2 (two) times daily. 10/17/23   [provider]  omeprazole  (PRILOSEC) 40  MG capsule Take 1 capsule (40 mg total) by mouth 2 (two) times daily. 09/12/23   Castaneda Mayorga, Daniel, MD  potassium chloride  SA (KLOR-CON  M) 20 MEQ tablet TAKE ONE TABLET ( TOTAL) BY MOUTH DAILY 12/07/23   Debera Jayson MATSU, MD  timolol (TIMOPTIC) 0.5 % ophthalmic solution Place 1 drop into both eyes daily. 10/23/17   [provider]  torsemide  (DEMADEX ) 20 MG tablet Take 1 tablet (20 mg total) by mouth daily. 10/12/23   Strader, Brittany M, PA-C  Vitamin D3 (VITAMIN D) 25 MCG tablet Take 1,000 Units by mouth daily.    [provider]    Scheduled Meds:  sodium chloride  flush  3 mL  Intravenous Q12H   Continuous Infusions:  lactated ringers      PRN Meds:   Allergies:    Allergies  Allergen Reactions   Naproxen Sodium Other (See Comments)    Tightness in throat    Social History:   Social History   Socioeconomic History   Marital status: Widowed    Spouse name: Not on file   Number of children: 3   Years of education: college   Highest education level: Bachelor's degree (e.g., BA, AB, BS)  Occupational History   Occupation: Retired  Tobacco Use   Smoking status: Former    Current packs/day: 0.00    Average packs/day: 3.0 packs/day for 30.0 years (90.0 ttl pk-yrs)    Types: Cigarettes    Start date: 04/05/1963    Quit date: 04/04/1993    Years since quitting: 30.7   Smokeless tobacco: Never  Vaping Use   Vaping status: Never Used  Substance and Sexual Activity   Alcohol use: Yes    Comment: Nightly glass of wine   Drug use: No   Sexual activity: Yes    Birth control/protection: Surgical  Other Topics Concern   Not on file  Social History Narrative   Lives alone.   Right-handed.   One cup coffee per day.  Occasional soda.   Social Drivers of Corporate investment banker Strain: Not on file  Food Insecurity: Not on file  Transportation Needs: Not on file  Physical Activity: Not on file  Stress: Not on file  Social Connections: Not on  file  Intimate Partner Violence: Not on file    Family History:    Family History  Problem Relation Age of Onset   Coronary artery disease Mother    Lung cancer Father    Colon cancer Neg Hx    Stomach cancer Neg Hx      ROS:  Please see the history of present illness.   All other ROS reviewed and negative.     Physical Exam/Data: Vitals:   01/02/24 1531 01/02/24 1532  BP: (!) 129/104   Pulse: (!) 30   Resp: 16   Temp: 97.7 F (36.5 C)   TempSrc: Oral   SpO2: 94%   Weight:  120.2 kg  Height:  5' 3 (1.6 m)   No intake or output data in the 24 hours ending 01/02/24 1617    01/02/2024    3:32 PM 01/02/2024    2:35 PM 10/23/2023    7:16 AM  Last 3 Weights  Weight (lbs) 264 lb 15.9 oz 265 lb 265 lb  Weight (kg) 120.2 kg 120.203 kg 120.203 kg     Body mass index is 46.94 kg/m.  General:  Well nourished, well developed, in no acute distress HEENT: normal Neck: no JVD Vascular: No carotid bruits; Neck is full Cardiac:  normal S1, S2; RRR; no murmurs Lungs: rhonchi   Abd: soft, nontender, no hepatomegaly  Ext: no edema Musculoskeletal:  No deformities, BUE and BLE strength normal and equal Skin: warm and dry  Neuro:  CNs 2-12 intact, no focal abnormalities noted Psych:  Normal affect   EKG:  The EKG was personally reviewed and demonstrates:  Junctional rhythm   29 bpm  QRS 99 msec   Telemetry:  Telemetry was personally reviewed and demonstrates:  Junctional rhythm   25 to 36 pm    Relevant CV Studies: Echocardiogram: 09/2019 IMPRESSIONS     1. Left ventricular ejection fraction, by estimation, is 60 to 65%. The  left ventricle has normal function. The left ventricle has no regional  wall motion abnormalities. Left ventricular diastolic parameters were  normal.   2. Right ventricular systolic function is normal. The right ventricular  size is normal.   3. Left atrial size was moderately dilated.   4. The mitral valve is normal in structure. Trivial mitral  valve  regurgitation. No evidence of mitral stenosis.   5. The aortic valve is tricuspid. Aortic valve regurgitation is not  visualized. No aortic stenosis is present.   6. The inferior vena cava is dilated in size with >50% respiratory  variability, suggesting right atrial pressure of 8 mmHg.    Cardiac Catheterization: 11/2019 The left ventricular systolic function is normal. LV end diastolic pressure is moderately elevated. The left ventricular ejection fraction is 55-65% by visual estimate.   1.  Normal coronary arteries. 2.  Normal LV systolic function.  Mitral annular calcifications noted. 3.  Moderately elevated left ventricular end-diastolic pressure at 24 mmHg.   Recommendations: The patient symptoms could be related to diastolic heart failure.  LVEDP was moderately elevated but the patient received aggressive precath hydration which might have contributed.  Continue torsemide  but consider adjusting the dose as needed.  Laboratory Data: High Sensitivity Troponin:  No results for input(s): TROPONINIHS in the last 720 hours.   ChemistryNo results for input(s): NA, K, CL, CO2, GLUCOSE, BUN, CREATININE, CALCIUM, MG, GFRNONAA, GFRAA, ANIONGAP in the last 168 hours.  No results for input(s): PROT, ALBUMIN, AST, ALT, ALKPHOS, BILITOT in the last 168 hours. Lipids No results for input(s): CHOL, TRIG, HDL, LABVLDL, LDLCALC, CHOLHDL in the last 168 hours.  Hematology Recent Labs  Lab 01/02/24 1557  WBC 4.8  RBC 4.48  HGB 10.6*  HCT 36.6  MCV 81.7  MCH 23.7*  MCHC 29.0*  RDW 22.9*  PLT 183   Thyroid  No results for input(s): TSH, FREET4 in the last 168 hours.  BNPNo results for input(s): BNP, PROBNP in the last 168 hours.  DDimer No results for input(s): DDIMER in the last 168 hours.  Radiology/Studies:  No results found.   Assessment and Plan: Bradycardia   Pt presents today in a junctional bradycardia    BP 95  to 103/  She is not dizzy Currently on metoprolol  50 bid and amiodarone  100 bid   Took at noon  I would recomm holding both meds   Follow on tele as b blocker washes out of system   Currently getting IV fluid bolus   BP minimally decreased  Pt comfortable   Plan for tx of [place] [facility]   Observe for now   Could try Isoprel if HR declines further with hypotension as bridge to temporary pacing  TSH drawn  2  PAF  Dx in Spring 2025   She is off of Eliquis  now given GI bleeding   3  HTN  Follow   4  Hx HFpEF  Volume status is OK  Follow    5  Hx CKD   Cr 1.81  5  Anemia   Hgb 10.6  For questions or updates, please contact [facility] Please consult [email or web address] for contact info under   Signed, Vina Gull, MD  01/02/2024 4:17 PM

## 2024-01-02 NOTE — ED Provider Notes (Signed)
 [place] EMERGENCY DEPARTMENT AT [facility] Provider Note   CSN: [id number] Arrival date & time: 01/02/24  1513     Patient presents with: Bradycardia   Kim Dixon is a 83 y.o. female.   HPI Patient presents for bradycardia.  Medical history includes HTN, HLD, atrial fibrillation, anxiety, asthma, depression.  She is prescribed medications include amiodarone , metoprolol , torsemide , losartan .  She was previously on Eliquis  but this was stopped due to a prior GI bleed.  She states that she has been having increased fatigue and increased somnolence lately.  At times, she will sleep for long hours throughout the day.  Because of this, she does not otherwise get her medications as prescribed.  She did take her morning medications at around noon today.  She went to her pulmonology appointment due to her recent shortness of breath and fatigue.  At that visit, she was noted to be hypotensive and bradycardic.  She was sent to the ED for further evaluation.  Patient reports recent symptoms of dyspnea that is worsened with exertion, fatigue, and near syncopal symptoms.  She does not check her vital signs at home.  Per chart review, her cardiologist is Dr. Debera.  She was last seen in the office in July.  She had a clean cath in 2021.  Echocardiogram in 2021 showed preserved EF.  She currently takes torsemide  once a day.    Prior to Admission medications   Medication Sig Start Date End Date Taking? Authorizing Provider  acetaminophen  (TYLENOL ) 500 MG tablet Take 1,000 mg by mouth every 6 (six) hours as needed for mild pain (pain score 1-3).   Yes [provider]  albuterol  (VENTOLIN  HFA) 108 (90 Base) MCG/ACT inhaler Inhale 2 puffs into the lungs every 6 (six) hours as needed for wheezing or shortness of breath. 05/17/21  Yes Sood, Vineet, MD  amiodarone  (PACERONE ) 200 MG tablet Take 0.5 tablets (100 mg total) by mouth daily. 10/12/23 10/06/24 Yes Strader, Laymon HERO, PA-C   Ascorbic Acid (VITAMIN C) 1000 MG tablet Take 3,000 mg by mouth daily.    Yes [provider]  atorvastatin (LIPITOR) 10 MG tablet Take 10 mg by mouth every Monday, Wednesday, and Friday at 8 PM.    Yes [provider]  bimatoprost (LUMIGAN) 0.01 % SOLN Place 1 drop into both eyes at bedtime.   Yes [provider]  calcium carbonate (TUMS - DOSED IN MG ELEMENTAL CALCIUM) 500 MG chewable tablet Chew 1 tablet by mouth daily as needed for indigestion.   Yes [provider]  Cyanocobalamin  (VITAMIN B-12 SL) Place 1 tablet under the tongue daily.   Yes [provider]  escitalopram (LEXAPRO) 10 MG tablet Take 10 mg by mouth daily. 02/08/22  Yes [provider]  fluticasone  furoate-vilanterol (BREO ELLIPTA ) 100-25 MCG/ACT AEPB Inhale 1 puff into the lungs daily. Patient taking differently: Inhale 1 puff into the lungs every evening. 06/05/22  Yes Sood, Vineet, MD  levETIRAcetam (KEPPRA) 500 MG tablet Take 250 mg by mouth 2 (two) times daily. 02/14/21  Yes [provider]  losartan  (COZAAR ) 25 MG tablet Take 1 tablet (25 mg total) by mouth daily. 04/03/23 01/02/24 Yes Strader, Laymon HERO, PA-C  Menthol , Topical Analgesic, (BIOFREEZE ROLL-ON EX) Apply 1 application  topically 2 (two) times daily as needed (back pain).   Yes [provider]  metoprolol  tartrate (LOPRESSOR ) 50 MG tablet Take 50 mg by mouth 2 (two) times daily. 10/17/23  Yes [provider]  omeprazole  (PRILOSEC) 40 MG capsule Take 1 capsule (40 mg total) by mouth 2 (two) times daily. 09/12/23  Yes Eartha Angelia Sieving, MD  potassium chloride  SA (KLOR-CON  M) 20 MEQ tablet TAKE ONE TABLET ( TOTAL) BY MOUTH DAILY Patient taking differently: Take 20 mEq by mouth daily. 12/07/23  Yes Debera Jayson MATSU, MD  timolol (TIMOPTIC) 0.5 % ophthalmic solution Place 1 drop into both eyes daily. 10/23/17  Yes [provider]  torsemide  (DEMADEX ) 20 MG tablet Take 1  tablet (20 mg total) by mouth daily. 10/12/23  Yes Strader, Laymon HERO, PA-C  Vitamin D3 (VITAMIN D) 25 MCG tablet Take 1,000 Units by mouth daily.   Yes [provider]    Allergies: Naproxen sodium    Review of Systems  Constitutional:  Positive for fatigue.  HENT:  Positive for congestion.   Respiratory:  Positive for cough and shortness of breath.   Neurological:  Positive for weakness (Generalized) and light-headedness.  All other systems reviewed and are negative.   Updated Vital Signs BP 130/68 (BP Location: Left Arm)   Pulse (!) 37   Temp 97.6 F (36.4 C) (Oral)   Resp 18   Ht 5' 3 (1.6 m)   Wt 120.2 kg   SpO2 95%   BMI 46.94 kg/m   Physical Exam Vitals and nursing note reviewed.  Constitutional:      General: She is not in acute distress.    Appearance: She is well-developed. She is not ill-appearing, toxic-appearing or diaphoretic.  HENT:     Head: Normocephalic and atraumatic.     Right Ear: External ear normal.     Left Ear: External ear normal.     Nose: Nose normal.     Mouth/Throat:     Mouth: Mucous membranes are moist.  Eyes:     Extraocular Movements: Extraocular movements intact.     Conjunctiva/sclera: Conjunctivae normal.  Cardiovascular:     Rate and Rhythm: Regular rhythm. Bradycardia present.  Pulmonary:     Effort: Pulmonary effort is normal. No respiratory distress.     Breath sounds: No wheezing or rales.  Abdominal:     General: There is no distension.     Palpations: Abdomen is soft.     Tenderness: There is no abdominal tenderness.  Musculoskeletal:        General: No swelling or deformity.     Cervical back: Normal range of motion and neck supple.     Right lower leg: No edema.     Left lower leg: No edema.  Skin:    General: Skin is warm and dry.     Coloration: Skin is not jaundiced or pale.  Neurological:     General: No focal deficit present.     Mental Status: She is alert and oriented to person, place, and time.      Cranial Nerves: No cranial nerve deficit.     Sensory: No sensory deficit.     Motor: No weakness.     Coordination: Coordination normal.  Psychiatric:        Mood and Affect: Mood normal.        Behavior: Behavior normal.     (all labs ordered are listed, but only abnormal results are displayed) Labs Reviewed  BASIC METABOLIC PANEL WITH GFR - Abnormal; Notable for the following components:      Result Value   Glucose, Bld 121 (*)    Creatinine, Ser 1.81 (*)    Calcium 8.7 (*)  GFR, Estimated 27 (*)    All other components within normal limits  CBC WITH DIFFERENTIAL/PLATELET - Abnormal; Notable for the following components:   Hemoglobin 10.6 (*)    MCH 23.7 (*)    MCHC 29.0 (*)    RDW 22.9 (*)    All other components within normal limits  MAGNESIUM  - Abnormal; Notable for the following components:   Magnesium  2.6 (*)    All other components within normal limits  HEPATIC FUNCTION PANEL - Abnormal; Notable for the following components:   Bilirubin, Direct 0.3 (*)    All other components within normal limits  LACTIC ACID, PLASMA - Abnormal; Notable for the following components:   Lactic Acid, Venous 2.0 (*)    All other components within normal limits  PRO BRAIN NATRIURETIC PEPTIDE - Abnormal; Notable for the following components:   Pro Brain Natriuretic Peptide 552.0 (*)    All other components within normal limits  TSH - Abnormal; Notable for the following components:   TSH 78.200 (*)    All other components within normal limits  T4, FREE - Abnormal; Notable for the following components:   Free T4 0.35 (*)    All other components within normal limits  CBG MONITORING, ED - Abnormal; Notable for the following components:   Glucose-Capillary 120 (*)    All other components within normal limits  TROPONIN T, HIGH SENSITIVITY - Abnormal; Notable for the following components:   Troponin T High Sensitivity 21 (*)    All other components within normal limits  TROPONIN T,  HIGH SENSITIVITY - Abnormal; Notable for the following components:   Troponin T High Sensitivity 20 (*)    All other components within normal limits  LACTIC ACID, PLASMA  T3  BASIC METABOLIC PANEL WITH GFR  CBC    EKG: EKG Interpretation Date/Time:  Wednesday January 02 2024 15:42:48 EDT Ventricular Rate:  29 PR Interval:    QRS Duration:  99 QT Interval:  223 QTC Calculation: 155 R Axis:   130  Text Interpretation: Junctional rhythm Right axis deviation Low voltage, extremity and precordial leads Borderline repolarization abnormality Confirmed by Melvenia Motto [phone]) on 01/02/2024 3:56:49 PM  Radiology: [facility] Chest Port 1 View Result Date: 01/02/2024 EXAM: 1 VIEW XRAY OF THE CHEST 01/02/2024 06:24:00 PM COMPARISON: Chest x-ray 6925. CLINICAL HISTORY: Symptomatic bradycardia. FINDINGS: LUNGS AND PLEURA: No focal pulmonary opacity. No pulmonary edema. No pleural effusion. No pneumothorax. HEART AND MEDIASTINUM: The heart is enlarged, unchanged. BONES AND SOFT TISSUES: No acute osseous abnormality. IMPRESSION: 1. No acute cardiopulmonary process. 2. Stable cardiomegaly. Electronically signed by: Greig Pique MD 01/02/2024 06:38 PM EDT RP Workstation: [id number]     Procedures   Medications Ordered in the ED  levETIRAcetam (KEPPRA) tablet 250 mg (250 mg Oral Given 01/02/24 2236)  fluticasone  furoate-vilanterol (BREO ELLIPTA ) 100-25 MCG/ACT 1 puff (1 puff Inhalation Given 01/02/24 2237)  enoxaparin (LOVENOX) injection 40 mg (40 mg Subcutaneous Given 01/02/24 2237)  levothyroxine (SYNTHROID) tablet 50 mcg (has no administration in time range)  ondansetron  (ZOFRAN ) tablet 4 mg (has no administration in time range)    Or  ondansetron  (ZOFRAN ) injection 4 mg (has no administration in time range)  polyethylene glycol (MIRALAX  / GLYCOLAX ) packet 17 g (has no administration in time range)  acetaminophen  (TYLENOL ) tablet 650 mg (has no administration in time range)    Or  acetaminophen   (TYLENOL ) suppository 650 mg (has no administration in time range)  0.9 %  sodium chloride  infusion ( Intravenous Restarted 01/02/24  2126)  lactated ringers  infusion (has no administration in time range)  lactated ringers  bolus 250 mL (0 mLs Intravenous Stopped 01/02/24 1652)  lactated ringers  bolus 250 mL (0 mLs Intravenous Stopped 01/02/24 1749)  atropine injection 0.5 mg (0.5 mg Intravenous Given 01/02/24 1756)  lactated ringers  bolus 250 mL (0 mLs Intravenous Stopped 01/02/24 1928)                                    Medical Decision Making Amount and/or Complexity of Data Reviewed Labs: ordered.  Risk Decision regarding hospitalization.   This patient presents to the ED for concern of bradycardia, this involves an extensive number of treatment options, and is a complaint that carries with it a high risk of complications and morbidity.  The differential diagnosis includes arrhythmia, heart block, polypharmacy   Co morbidities / Chronic conditions that complicate the patient evaluation  HTN, HLD, atrial fibrillation, anxiety, asthma, depression   Additional history obtained:  Additional history obtained from EMR External records from outside source obtained and reviewed including N/A   Lab Tests:  I Ordered, and personally interpreted labs.  The pertinent results include: Baseline anemia, no leukocytosis, creatinine is mildly increased from baseline, hypomagnesemia with otherwise normal electrolytes.   Imaging Studies ordered:  I ordered imaging studies including chest x-ray I independently visualized and interpreted imaging which showed no acute findings I agree with the radiologist interpretation   Cardiac Monitoring: / EKG:  The patient was maintained on a cardiac monitor.  I personally viewed and interpreted the cardiac monitored which showed an underlying rhythm of: Slow junctional rhythm   Problem List / ED Course / Critical interventions / Medication  management  Patient presenting for bradycardia.  This was identified at her pulmonology appointment today.  She has been having recent exertional dyspnea and near syncopal symptoms.  On arrival, patient is bradycardic in the 30s.  EKG consistent with a junctional rhythm.  Current blood pressure is normal.  Workup was initiated today gentle IV fluid was ordered.  I spoke with cardiologist on-call, Dr. Okey, who will see in consult.  Patient's lab work notable for increased creatinine from baseline.  On reassessment, patient resting comfortably.  She remains normotensive.  Heart rate is in the range of 30.  Patient was evaluated cardiology.  They recommend admission to [facility].  Patient was admitted for further management. I ordered medication including IV fluids for hydration Reevaluation of the patient after these medicines showed that the patient improved I have reviewed the patients home medicines and have made adjustments as needed   Consultations Obtained:  I requested consultation with the cardiologist, Dr. Okey,  and discussed lab and imaging findings as well as pertinent plan - they recommend: Admission to [facility]   Social Determinants of Health:  Has access to outpatient care     Final diagnoses:  Symptomatic bradycardia    ED Discharge Orders     None          Melvenia Motto, MD 01/02/24 2352

## 2024-01-03 ENCOUNTER — Observation Stay (HOSPITAL_COMMUNITY)

## 2024-01-03 DIAGNOSIS — R001 Bradycardia, unspecified: Secondary | ICD-10-CM | POA: Diagnosis not present

## 2024-01-03 LAB — BASIC METABOLIC PANEL WITH GFR
Anion gap: 8 (ref 5–15)
BUN: 15 mg/dL (ref 8–23)
CO2: 32 mmol/L (ref 22–32)
Calcium: 8.5 mg/dL — ABNORMAL LOW (ref 8.9–10.3)
Chloride: 101 mmol/L (ref 98–111)
Creatinine, Ser: 1.65 mg/dL — ABNORMAL HIGH (ref 0.44–1.00)
GFR, Estimated: 31 mL/min — ABNORMAL LOW (ref >60)
Glucose, Bld: 108 mg/dL — ABNORMAL HIGH (ref 70–99)
Potassium: 4.3 mmol/L (ref 3.5–5.1)
Sodium: 141 mmol/L (ref 135–145)

## 2024-01-03 LAB — CBC
HCT: 31.8 % — ABNORMAL LOW (ref 36.0–46.0)
Hemoglobin: 9.3 g/dL — ABNORMAL LOW (ref 12.0–15.0)
MCH: 23.6 pg — ABNORMAL LOW (ref 26.0–34.0)
MCHC: 29.2 g/dL — ABNORMAL LOW (ref 30.0–36.0)
MCV: 80.7 fL (ref 80.0–100.0)
Platelets: 155 K/uL (ref 150–400)
RBC: 3.94 MIL/uL (ref 3.87–5.11)
RDW: 22.5 % — ABNORMAL HIGH (ref 11.5–15.5)
WBC: 4.2 K/uL (ref 4.0–10.5)
nRBC: 0 % (ref 0.0–0.2)

## 2024-01-03 LAB — ECHOCARDIOGRAM COMPLETE
Height: 63 in
MV M vel: 4.34 m/s
MV Peak grad: 75.3 mmHg
Weight: 4239.89 [oz_av]

## 2024-01-03 LAB — T3: T3, Total: 30 ng/dL — ABNORMAL LOW (ref 71–180)

## 2024-01-03 MED ORDER — LEVOTHYROXINE SODIUM 88 MCG PO TABS
88.0000 ug | ORAL_TABLET | Freq: Every day | ORAL | Status: DC
  Administered 2024-01-04 – 2024-01-05 (×2): 88 ug via ORAL
  Filled 2024-01-03 (×2): qty 1

## 2024-01-03 NOTE — TOC CM/SW Note (Signed)
 Transition of Care [facility]) - Inpatient Brief Assessment   Patient Details  Name: Kim Dixon MRN: [id number] Date of Birth: 1940/06/09  Transition of Care [facility]) CM/SW Contact:    Sudie Erminio Deems, RN Phone Number: 01/03/2024, 2:53 PM   Clinical Narrative: Patient presented for symptomatic bradycardia. PTA patient states she was independent from home alone. Patient has support of daughter Darice that lives in [place] and she checks in on her often and drives her to appointments. Patient has DME CPAP, [employer], and [name]. No home needs identified at this time.    Transition of Care Asessment: Insurance and Status: Insurance coverage has been reviewed Patient has primary care physician: Yes Home environment has been reviewed: reviewed Prior level of function:: independent Prior/Current Home Services: No current home services Social Drivers of Health Review: SDOH reviewed no interventions necessary Readmission risk has been reviewed: Yes Transition of care needs: no transition of care needs at this time

## 2024-01-03 NOTE — Hospital Course (Addendum)
 Kim Dixon is a 83 y.o. female with medical history significant for atrial fibrillation, hypertension, asthma, depression, OSA. Patient was sent to the ED from Dr. Chari (pulmonologist) office reports of low heart rate and low blood pressure.  Patient reports over the past 2 months, she has had increasing fatigue, increased somnolence, sleeping for several hours at a time, difficulty breathing with exertion, and some chest tightness when she is short of breath otherwise no chest pain.  She reports nearly passing out also. Patient is on metoprolol  and amiodarone , her last dose of metoprolol  was about 12 noon today.  She does not take her medications at a consistent time daily.  She believes she took amiodarone  this morning also.  She does not check her vitals at home.  She reports she saw her outpatient provider Dr. Sheryle about 2 weeks ago, and they were not happy about her vitals. Patient's amiodarone  and metoprolol  were held.  Patient went into atrial fibrillation with rapid ventricular response and initially metoprolol  was started.  Then patient went down to bradycardia and pauses.  Metoprolol  was discontinued and amiodarone  was added.  EP and cardiology came back and forth and advised that patient needs to have a pacemaker.  EP advised for IV levothyroxine.  Patient has been placed on levothyroxine 75 mics IV daily.

## 2024-01-03 NOTE — Progress Notes (Signed)
 Mobility Specialist Progress Note;   01/03/24 1136  Mobility  Activity Ambulated with assistance (to bathroom)  Level of Assistance Contact guard assist, steadying assist  Assistive Device Other (Comment) (HHA)  Distance Ambulated (ft) 8 ft  Activity Response Tolerated well  Mobility Referral Yes  Mobility visit 1 Mobility  Mobility Specialist Start Time (ACUTE ONLY) 1136  Mobility Specialist Stop Time (ACUTE ONLY) 1142  Mobility Specialist Time Calculation (min) (ACUTE ONLY) 6 min   Answered pts call light requesting assistance to BR. Required MinG via HHA to safely ambulate to BR. HR up to 62 w/ exertion. Deferred further mobility d/t lunch tray arriving. Left with needs met.   Lauraine Erm Mobility Specialist Please contact via SecureChat or [employer] [phone]

## 2024-01-03 NOTE — Plan of Care (Signed)

## 2024-01-03 NOTE — Progress Notes (Signed)
 Progress Note   Patient: Kim Dixon FMW:[id number] DOB: 15-Jul-1940 DOA: 01/02/2024     0 DOS: the patient was seen and examined on 01/03/2024   Brief hospital course: Kim Dixon is a 83 y.o. female with medical history significant for atrial fibrillation, hypertension, asthma, depression, OSA. Patient was sent to the ED from Dr. Chari (pulmonologist) office reports of low heart rate and low blood pressure.  Patient reports over the past 2 months, she has had increasing fatigue, increased somnolence, sleeping for several hours at a time, difficulty breathing with exertion, and some chest tightness when she is short of breath otherwise no chest pain.  She reports nearly passing out also. Patient is on metoprolol  and amiodarone , her last dose of metoprolol  was about 12 noon today.  She does not take her medications at a consistent time daily.  She believes she took amiodarone  this morning also.  She does not check her vitals at home.  She reports she saw her outpatient provider Dr. Sheryle about 2 weeks ago, and they were not happy about her vitals  Assessment and Plan:  Symptomatic bradycardia with hypotension - Looks like due to severe hypothyroidism - chest x-ray- negative for acute abnormality - Total of 500 mL bolus give, cont LR 75cc/hr x 20hrs - TSH- quite elevated at 78.2, last checked 2 months ago -21, not on Synthroid, may be contributing to her bradycardia, and other symptoms, in addition to her heart rate limiting meds - Magnesium -2.6, potassium 4.3.  Troponin 21 - Echocardiogram. - Heart rate is consistently more than 40 today -Cardiology advised for medical management of hypothyroidism   AKI-creatinine 1.8, baseline 0.9-1.2.  With lactic acid of 2.  Likely due to hypotension. - Fluids per above - Trend lactic acid - Hold torsemide  20 mg daily, losartan  25 mg daily   Hypothyroidism-TSH markedly elevated at 78.2.  With symptomatic bradycardia, fatigue, somnolence  X 2 months.  Awake and alert on my evaluation. - Low T3, free T4 - Continue Synthroid 88 mcg daily   Atrial fibrillation-presenting with marked sinus bradycardia. - Hold amiodarone  and metoprolol  - Anticoagulation due to GI bleed   Hypertension-currently hypotensive - Hold losartan , metoprolol , torsemide    Diastolic CHF-stable and compensated.  Last echo 09/2019 EF 60 to 65%. - Hold torsemide  with hypotension     Subjective: Feels tired otherwise no symptoms  Physical Exam: Vitals:   01/02/24 2315 01/03/24 0251 01/03/24 0900 01/03/24 1200  BP: 130/68  (!) 140/78 (!) 158/72  Pulse: (!) 37  (!) 44   Resp: 18 18    Temp: 97.6 F (36.4 C) 97.6 F (36.4 C) 98.6 F (37 C) 97.9 F (36.6 C)  TempSrc: Oral Oral Oral Oral  SpO2: 95% 94%    Weight:      Height:       Constitutional: Alert, awake, calm, comfortable HEENT: Neck supple Respiratory: clear to auscultation bilaterally, no wheezing, no crackles. Normal respiratory effort. No accessory muscle use.  Cardiovascular: Regular rate and rhythm, no murmurs / rubs / gallops. No extremity edema. 2+ pedal pulses. No carotid bruits.  Abdomen: no tenderness, no masses palpated. No hepatosplenomegaly. Bowel sounds positive.  Musculoskeletal: no clubbing / cyanosis. No joint deformity upper and lower extremities. Good ROM, no contractures. Normal muscle tone.  Skin: no rashes, lesions, ulcers. No induration Neurologic: CN 2-12 grossly intact. Sensation intact, DTR normal. Strength 5/5 x all 4 extremities.  Psychiatric: Normal judgment and insight. Alert and oriented x 3. Normal mood.  Data Reviewed:  Free T3 is 30, free T4 is 0.35  Family Communication: Daughter at bedside  Disposition: Status is: Observation The patient remains OBS appropriate and will d/c before 2 midnights.  Planned Discharge Destination: Home    Time spent: 35 minutes  Author: Nena Rebel, MD 01/03/2024 2:59 PM  For on call review  [email or web address].

## 2024-01-03 NOTE — Care Management Obs Status (Signed)
 MEDICARE OBSERVATION STATUS NOTIFICATION   Patient Details  Name: Kim Dixon MRN: [id number] Date of Birth: 1940/05/12   Medicare Observation Status Notification Given:  Yes    Vonzell Arrie Sharps 01/03/2024, 9:26 AM

## 2024-01-03 NOTE — Progress Notes (Signed)
 Echocardiogram 2D Echocardiogram has been performed.  Kim Dixon 01/03/2024, 8:52 AM

## 2024-01-03 NOTE — Progress Notes (Signed)
 Progress Note  Patient Name: Kim Dixon Date of Encounter: 01/03/2024 [facility] Cardiologist: Jayson Sierras, MD   Interval Summary   Sitting up comfortably in bed during the interview.  Reported that she has been able to get up and go to the bathroom that she has tired when she gets to the bathroom.  Denies any chest pain, lower extremity edema, orthopnea, or shortness of breath.  Vital Signs Vitals:   01/02/24 2142 01/02/24 2210 01/02/24 2315 01/03/24 0251  BP:  104/87 130/68   Pulse:  67 (!) 37   Resp:  15 18 18   Temp:  97.6 F (36.4 C) 97.6 F (36.4 C) 97.6 F (36.4 C)  TempSrc:  Oral Oral Oral  SpO2: 99% 100% 95% 94%  Weight:      Height:        Intake/Output Summary (Last 24 hours) at 01/03/2024 0743 Last data filed at 01/03/2024 0328 Gross per 24 hour  Intake 1520.92 ml  Output --  Net 1520.92 ml      01/02/2024    3:32 PM 01/02/2024    2:35 PM 10/23/2023    7:16 AM  Last 3 Weights  Weight (lbs) 264 lb 15.9 oz 265 lb 265 lb  Weight (kg) 120.2 kg 120.203 kg 120.203 kg      Telemetry/ECG  Initially in junctional bradycardia in the 20s and 30s.  Converted to sinus bradycardia at about 11 PM last night.  Heart rates have slowly increased and are currently in the 40s.- Personally Reviewed  Physical Exam  GEN: No acute distress.  Appearing about stated age.  Alert and orientated Neck: No JVD Cardiac: RRR, no murmurs, rubs, or gallops.  Respiratory: Wheezing heard throughout the lungs. GI: Soft, nontender, non-distended  MS: No edema  Assessment & Plan   Kim Dixon is a 83 y.o. female with a hx of PAF, HTN, HL, HFpEF, COPD, TIA in 2020, and OSA  who is being seen for bradycardia.  PAF Junctional bradycardia Hypotension CHA2DS2-VASc Score = 8 [CHF History: 1, HTN History: 1, Diabetes History: 1, Stroke History: 2, Vascular Disease History: 0, Age Score: 2, Gender Score: 1].  Therefore, the patient's annual risk of stroke is 10.8  %.    Patient reported that her atrial fibrillation is asymptomatic. Presented to the ER in junctional bradycardia in the 30's and hypotension. Received IV fluids and blood pressures have improved. The patients Metoprolol  50mg  BID and amiodarone  100mg  daily were held. Converted to sinus rhythm at about 11 PM last night.  Heart rates appear to have improved slightly and she is currently in the 40s. Suspect that the bradycardia is secondary to the hypothyroidism. Echo pending. May restart Eliquis  when cleared to do so by GI.  May consider watchman. Stop amiodarone  Hold metoprolol    Hypothyroidism TSH was checked and was elevated at 78.2 and T4 was decreased at 0.35. Will stop amiodarone . It appears like the patient is ready been started on levothyroxine. Management per primary.    HFpEF Hypertension Echo on 09/2019 showed a normal LVEF of 60-65%, No RWMA, Normal RV function, moderately dilated LA, and grossly normal valve function. Appears euvolemic on exam Hold home losartan  will consider restarting after heart rate improves and there is less concern about hypotension. Hold home torsemide .   Anemia Patient underwent a capsule endoscopy that showed a small bowel polyp. GI was planning on doing an enteroscopy. It does not appear like this procedure has been done yet and eliquis  has not  been restarted.  Hemoglobin 9.3 Management per GI.   Hyperlipidemia May continue home atorvastatin 10 mg Monday Wednesday Friday   Diabetes Had A1C of 6.5 in 2020. Increases Chads2-vasc-score. Management per primary.   Otherwise management per primary    For questions or updates, please contact [facility] Please consult [email or web address] for contact info under        Signed, Kim Calder, PA-C

## 2024-01-04 DIAGNOSIS — R001 Bradycardia, unspecified: Secondary | ICD-10-CM | POA: Diagnosis not present

## 2024-01-04 LAB — COMPREHENSIVE METABOLIC PANEL WITH GFR
ALT: 9 U/L (ref 0–44)
AST: 19 U/L (ref 15–41)
Albumin: 2.7 g/dL — ABNORMAL LOW (ref 3.5–5.0)
Alkaline Phosphatase: 38 U/L (ref 38–126)
Anion gap: 8 (ref 5–15)
BUN: 13 mg/dL (ref 8–23)
CO2: 33 mmol/L — ABNORMAL HIGH (ref 22–32)
Calcium: 8.7 mg/dL — ABNORMAL LOW (ref 8.9–10.3)
Chloride: 100 mmol/L (ref 98–111)
Creatinine, Ser: 1.29 mg/dL — ABNORMAL HIGH (ref 0.44–1.00)
GFR, Estimated: 41 mL/min — ABNORMAL LOW (ref >60)
Glucose, Bld: 149 mg/dL — ABNORMAL HIGH (ref 70–99)
Potassium: 4 mmol/L (ref 3.5–5.1)
Sodium: 141 mmol/L (ref 135–145)
Total Bilirubin: 0.7 mg/dL (ref 0.0–1.2)
Total Protein: 5.4 g/dL — ABNORMAL LOW (ref 6.5–8.1)

## 2024-01-04 LAB — BASIC METABOLIC PANEL WITH GFR
Anion gap: 8 (ref 5–15)
BUN: 14 mg/dL (ref 8–23)
CO2: 31 mmol/L (ref 22–32)
Calcium: 8.7 mg/dL — ABNORMAL LOW (ref 8.9–10.3)
Chloride: 103 mmol/L (ref 98–111)
Creatinine, Ser: 1.37 mg/dL — ABNORMAL HIGH (ref 0.44–1.00)
GFR, Estimated: 38 mL/min — ABNORMAL LOW (ref >60)
Glucose, Bld: 101 mg/dL — ABNORMAL HIGH (ref 70–99)
Potassium: 3.8 mmol/L (ref 3.5–5.1)
Sodium: 142 mmol/L (ref 135–145)

## 2024-01-04 MED ORDER — METOPROLOL TARTRATE 12.5 MG HALF TABLET
12.5000 mg | ORAL_TABLET | Freq: Two times a day (BID) | ORAL | Status: DC
  Administered 2024-01-04: 12.5 mg via ORAL
  Filled 2024-01-04: qty 1

## 2024-01-04 MED ORDER — AMIODARONE HCL 200 MG PO TABS
100.0000 mg | ORAL_TABLET | Freq: Every day | ORAL | Status: DC
  Administered 2024-01-04: 100 mg via ORAL
  Filled 2024-01-04: qty 1

## 2024-01-04 MED ORDER — TORSEMIDE 20 MG PO TABS: 20.0000 mg | ORAL_TABLET | Freq: Every day | ORAL | Status: DC

## 2024-01-04 NOTE — Progress Notes (Signed)
 Progress Note   Patient: Kim Dixon FMW:[id number] DOB: [birth year] DOA: 01/02/2024     0 DOS: the patient was seen and examined on 01/04/2024   Brief hospital course: Kim Dixon is a 83 y.o. female with medical history significant for atrial fibrillation, hypertension, asthma, depression, OSA. Patient was sent to the ED from Dr. Chari (pulmonologist) office reports of low heart rate and low blood pressure.  Patient reports over the past 2 months, she has had increasing fatigue, increased somnolence, sleeping for several hours at a time, difficulty breathing with exertion, and some chest tightness when she is short of breath otherwise no chest pain.  She reports nearly passing out also. Patient is on metoprolol  and amiodarone , her last dose of metoprolol  was about 12 noon today.  She does not take her medications at a consistent time daily.  She believes she took amiodarone  this morning also.  She does not check her vitals at home.  She reports she saw her outpatient provider Dr. Sheryle about 2 weeks ago, and they were not happy about her vitals  Assessment and Plan:  Symptomatic bradycardia with hypotension - Looks like due to severe hypothyroidism, one episode of pause today - chest x-ray- negative for acute abnormality - Total of 500 mL bolus give, cont LR 75cc/hr x 20hrs - TSH- quite elevated at 78.2, last checked 2 months ago -21, not on Synthroid, may be contributing to her bradycardia, and other symptoms, in addition to her heart rate limiting meds - Magnesium -2.6, potassium 4.3.  Troponin 21 - EF: 60-65% - Went into afin last night, started on Metoprolol , now went into pauses, card advised to hold metoprolol .      AKI-creatinine 1.8, back to baseline, d/c IVF - Fluids per above - Trend lactic acid - Hold torsemide  20 mg daily, losartan  25 mg daily   Hypothyroidism-TSH markedly elevated at 78.2.  With symptomatic bradycardia, fatigue, somnolence X 2 months.  Awake  and alert on my evaluation. - Low T3, free T4 - Continue Synthroid 88 mcg daily   Atrial fibrillation-presenting with marked sinus bradycardia. - Hold amiodarone  and metoprolol  - Anticoagulation due to GI bleed   Hypertension-currently hypotensive - Hold losartan , metoprolol , torsemide    Diastolic CHF-stable and compensated.  Last echo 09/2019 EF 60 to 65%. - Hold torsemide  with hypotension      Subjective: Feels okay  Physical Exam: Vitals:   01/03/24 2340 01/04/24 0426 01/04/24 0812 01/04/24 1119  BP: 121/65 125/87 121/89 118/84  Pulse: 95 (!) 108 (!) 52 (!) 110  Resp: 20 18 16 16   Temp: 98.2 F (36.8 C) 97.9 F (36.6 C) 98.4 F (36.9 C) 98.1 F (36.7 C)  TempSrc: Oral Oral Oral Oral  SpO2: 94% 100% 97% 92%  Weight:      Height:       Constitutional: Alert, awake, calm, comfortable HEENT: Neck supple Respiratory: clear to auscultation bilaterally, no wheezing, no crackles. Normal respiratory effort. No accessory muscle use.  Cardiovascular: Regular rate and rhythm, no murmurs / rubs / gallops. No extremity edema. 2+ pedal pulses. No carotid bruits.  Abdomen: no tenderness, no masses palpated. No hepatosplenomegaly. Bowel sounds positive.  Musculoskeletal: no clubbing / cyanosis. No joint deformity upper and lower extremities. Good ROM, no contractures. Normal muscle tone.  Skin: no rashes, lesions, ulcers. No induration Neurologic: CN 2-12 grossly intact. Sensation intact, DTR normal. Strength 5/5 x all 4 extremities.  Psychiatric: Normal judgment and insight. Alert and oriented x 3. Normal mood.  Data Reviewed:  Reviewed  Family Communication: None today  Disposition: Status is: Observation The patient will require care spanning > 2 midnights and should be moved to inpatient because: symptomatic bradycardia, pauses and severe hypothyroidism  Planned Discharge Destination: Home and Home with Home Health    Time spent: 35 minutes  Author: Nena Rebel,  MD 01/04/2024 3:27 PM  For on call review [email or web address].

## 2024-01-04 NOTE — Plan of Care (Signed)
  Problem: Clinical Measurements: Goal: Ability to maintain clinical measurements within normal limits will improve Outcome: Progressing Goal: Will remain free from infection Outcome: Progressing Goal: Respiratory complications will improve Outcome: Progressing Goal: Cardiovascular complication will be avoided Outcome: Progressing   Problem: Pain Managment: Goal: General experience of comfort will improve and/or be controlled Outcome: Progressing

## 2024-01-04 NOTE — Progress Notes (Addendum)
  Progress Note  Patient Name: Kim Dixon Date of Encounter: 01/04/2024 [facility] Cardiologist: Jayson Sierras, MD    Interval Summary    Patient reports feeling well this AM. No chest pain or palpitations. She usually wears a CPAP at home, but has not been wearing here. Instead she has been wearing a nasal cannula overnight. She denies cough, ankle edema.   Vital Signs Vitals:   01/03/24 2050 01/03/24 2340 01/04/24 0426 01/04/24 0812  BP: 117/76 121/65 125/87 121/89  Pulse: (!) 110 95 (!) 108 (!) 52  Resp: 16 20 18 16   Temp: 97.9 F (36.6 C) 98.2 F (36.8 C) 97.9 F (36.6 C) 98.4 F (36.9 C)  TempSrc: Oral Oral Oral Oral  SpO2: 96% 94% 100% 97%  Weight:      Height:        Intake/Output Summary (Last 24 hours) at 01/04/2024 0829 Last data filed at 01/03/2024 0900 Gross per 24 hour  Intake 240 ml  Output --  Net 240 ml      01/02/2024    3:32 PM 01/02/2024    2:35 PM 10/23/2023    7:16 AM  Last 3 Weights  Weight (lbs) 264 lb 15.9 oz 265 lb 265 lb  Weight (kg) 120.2 kg 120.203 kg 120.203 kg      Telemetry/ECG  Patient converted to atrial fibrillation with HR in the 100s-110s. Converted yesterday evening around 1830  - Personally Reviewed  Physical Exam  GEN: No acute distress.  Laying flat in the bed  Neck: No JVD Cardiac: Irregular rate and rhythm. no murmurs, rubs, or gallops.  Respiratory: Clear to auscultation bilaterally. Normal WOB on room air  GI: Soft, nontender, non-distended  MS: No edema in BLE   Assessment & Plan   PAF Symptomatic bradycardia  - Patient had previously been diagnosed with PAF in spring 2025. Not on AC due to GI bleeding  - Presented with bradycardia. Found to be in a junctional rhythm. Had been on metoprolol  50 mg BID and amiodarone  100 mg BID PTA. These were held on admission  - Found to have severe hypothyroidism with TSH 78, free T4 0.35  - Suspect bradycardia secondary to hypothyroidism and AV nodal  medications. - Echocardiogram this admission showed EF 60-65%, no regional wall motion abnormalities, grade III DD, normal RV systolic function  - Patient converted to atrial fibrillation with HR in the 100s-110s yesterday evening. Has been asymptomatic with this. She has OSA and was not wearing CPAP at the time. Suspect this contributed  - Resume metoprolol  tartrate at 12.5 mg BID. If HR tolerates this, can increase as needed  - Patient continues to be anemic with hemoglobin 9.3. Patient is followed by GI as an outpatient. Planning with push enteroscopy since a small bowel lesion was noted on capsule endoscopy.   - Recommend endocrinology referral   Hypothyroidism  - TSH 78, free T4 0.35  - Now on synthroid. Recommend endocrinology referral as above   Chronic HFpEF Mild MR - Echo this admission showed EF 60-65%, no regional wall motion abnormalities, grade III DD, normal RV systolic function, mild MR  - With grade III DD, consider outpatient workup for infiltrative disease  - Resume home torsemide  20 mg daily once renal function returns to baseline. BP stable   For questions or updates, please contact [facility] Please consult [email or web address] for contact info under   Signed, Rollo FABIENE Louder, PA-C

## 2024-01-04 NOTE — TOC Progression Note (Addendum)
 Transition of Care [facility]) - Progression Note    Patient Details  Name: Kim Dixon MRN: [id number] Date of Birth: [birth year]  Transition of Care [facility] CM/SW Contact  Graves-Bigelow, Erminio Deems, RN Phone Number: 01/04/2024, 2:37 PM  Clinical Narrative:  Physical Therapy was able to work with the patient today. Plan for home with home health. Patient asked for [name] to provide the services. Referral submitted and awaiting response from [facility]. No DME needed for the patient. Daughter is at the bedside and she will provide transportation home if the plan continues to be for home today. Patient did not qualify for home oxygen. No further needs identified at this time.   1457 01-04-24 [name] can accept the patient. No further needs identified at this time.   Expected Discharge Plan: Home w Home Health Services Barriers to Discharge: No Barriers Identified  Expected Discharge Plan and Services In-house Referral: NA Discharge Planning Services: CM Consult Post Acute Care Choice: Home Health Living arrangements for the past 2 months: Single Family Home  HH Arranged: PT HH Agency: [facility] Date [facility] Agency Contacted: 01/04/24 Time HH Agency Contacted: 1437 Representative spoke with at [facility] Agency: Darleene   Social Drivers of Health (SDOH) Interventions SDOH Screenings   Food Insecurity: No Food Insecurity (01/02/2024)  Housing: Low Risk  (01/02/2024)  Transportation Needs: No Transportation Needs (01/02/2024)  Utilities: Not At Risk (01/02/2024)  Social Connections: Moderately Integrated (01/02/2024)  Tobacco Use: Medium Risk (01/02/2024)    Readmission Risk Interventions     No data to display

## 2024-01-04 NOTE — Progress Notes (Signed)
 Mobility Specialist Progress Note;   01/04/24 1010  Mobility  Activity Pivoted/transferred from bed to chair  Level of Assistance Contact guard assist, steadying assist  Assistive Device None  Distance Ambulated (ft) 10 ft  Activity Response Tolerated well  Mobility Referral Yes  Mobility visit 1 Mobility  Mobility Specialist Start Time (ACUTE ONLY) 1010  Mobility Specialist Stop Time (ACUTE ONLY) 1021  Mobility Specialist Time Calculation (min) (ACUTE ONLY) 11 min   Pt agreeable to mobility. Requested to sit in chair. Required MinG assistance to safely ambulate around bed to chair. HR up to 148 w/ exertion, recovered w/ rest. VSS on supplemental O2. Pt left in chair with all needs met. RN in room.   Lauraine Erm Mobility Specialist Please contact via SecureChat or [employer] [phone]

## 2024-01-04 NOTE — Evaluation (Signed)
 Physical Therapy Evaluation Patient Details Name: Kim Dixon MRN: [id number] DOB: [birth year] Today's Date: 01/04/2024  History of Present Illness  Pt is an 83 y/o female admitted 10/15 for low HR and blood pressure.  Work up/intervention in progress.  PMHx: Glaucoma, OSA, PAF, SCC  Clinical Impression  Pt admitted with/for problems above.  Amb sats completed.  Pt generally at Supervision level, but deconditioned.  Pt currently limited functionally due to the problems listed below.  (see problems list.)  Pt will benefit from PT to maximize function and safety to be able to get home safely with available assist .         If plan is discharge home, recommend the following:  (PRN assist)   Can travel by private vehicle        Equipment Recommendations None recommended by PT  Recommendations for Other Services       Functional Status Assessment Patient has had a recent decline in their functional status and demonstrates the ability to make significant improvements in function in a reasonable and predictable amount of time.     Precautions / Restrictions Precautions Precautions:  (low fall risk) Recall of Precautions/Restrictions: Intact      Mobility  Bed Mobility Overal bed mobility: Needs Assistance             General bed mobility comments: OOB on arrival    Transfers Overall transfer level: Needs assistance Equipment used: None Transfers: Sit to/from Stand Sit to Stand: Supervision           General transfer comment: appropriately safe with mobility    Ambulation/Gait Ambulation/Gait assistance: Supervision Gait Distance (Feet): 90 Feet (x2 with standing rest to rest and note vitals.) Assistive device: None Gait Pattern/deviations: Step-through pattern   Gait velocity interpretation: <1.8 ft/sec, indicate of risk for recurrent falls   General Gait Details: significant lateral sway (waddle) gait, but generally stable.  Recommended  continued  cane use outside.  See alternate Progress Note for Pulmonary/gait assessment.  Summary:  generally assymptomatic except mild breathlessness, labile HR tachy into the 130's to 163 bpm, 1x-brady to 44 bpm.  SpO2 on RA 93-95% overall  Stairs            Wheelchair Mobility     Tilt Bed    Modified Rankin (Stroke Patients Only)       Balance Overall balance assessment: Needs assistance   Sitting balance-Leahy Scale: Good       Standing balance-Leahy Scale: Fair                               Pertinent Vitals/Pain Pain Assessment Pain Assessment: Faces Faces Pain Scale: No hurt Pain Intervention(s): Monitored during session    Home Living Family/patient expects to be discharged to:: Private residence Living Arrangements: Alone Available Help at Discharge: Family;Available PRN/intermittently Type of Home: House Home Access: Stairs to enter Entrance Stairs-Rails: Lawyer of Steps: 4   Home Layout: One level Home Equipment: Grab bars - tub/shower;Shower seat;Cane - single Librarian, academic (2 wheels) (upright walker bari)      Prior Function Prior Level of Function : Independent/Modified Independent             Mobility Comments: uses cane outdoors to mailbox, generally no DME inside       Extremity/Trunk Assessment   Upper Extremity Assessment Upper Extremity Assessment: Overall WFL for tasks assessed    Lower Extremity Assessment Lower  Extremity Assessment: Overall WFL for tasks assessed    Cervical / Trunk Assessment Cervical / Trunk Assessment: Kyphotic  Communication   Communication Communication: No apparent difficulties    Cognition Arousal: Alert Behavior During Therapy: WFL for tasks assessed/performed   PT - Cognitive impairments: No apparent impairments                         Following commands: Intact       Cueing Cueing Techniques: Verbal cues     General Comments General  comments (skin integrity, edema, etc.): see gait comments    Exercises     Assessment/Plan    PT Assessment Patient needs continued PT services  PT Problem List Cardiopulmonary status limiting activity;Decreased activity tolerance;Decreased balance;Decreased mobility;Decreased knowledge of use of DME       PT Treatment Interventions      PT Goals (Current goals can be found in the Care Plan section)  Acute Rehab PT Goals Patient Stated Goal: home today PT Goal Formulation: With patient Time For Goal Achievement: 01/11/24 Potential to Achieve Goals: Good    Frequency Min 1X/week     Co-evaluation               AM-PAC PT 6 Clicks Mobility  Outcome Measure Help needed turning from your back to your side while in a flat bed without using bedrails?: A Little Help needed moving from lying on your back to sitting on the side of a flat bed without using bedrails?: A Little Help needed moving to and from a bed to a chair (including a wheelchair)?: A Little Help needed standing up from a chair using your arms (e.g., wheelchair or bedside chair)?: A Little Help needed to walk in hospital room?: A Little Help needed climbing 3-5 steps with a railing? : A Little 6 Click Score: 18    End of Session   Activity Tolerance: Patient tolerated treatment well Patient left: in chair;with call bell/phone within reach;with family/visitor present   PT Visit Diagnosis: Other abnormalities of gait and mobility (R26.89);Difficulty in walking, not elsewhere classified (R26.2)    Time: 1202-1236 PT Time Calculation (min) (ACUTE ONLY): 34 min   Charges:   PT Evaluation $PT Eval Moderate Complexity: 1 Mod PT Treatments $Gait Training: 8-22 mins PT General Charges $$ ACUTE PT VISIT: 1 Visit         01/04/2024  India HERO., PT Acute Rehabilitation Services [phone]  (office)  Vinie GAILS Jalaila Caradonna 01/04/2024, 1:22 PM

## 2024-01-04 NOTE — Progress Notes (Addendum)
   Notified by RN that patient had a 6.3 second pause. I reviewed telemetry. Patient had a 6.3 post-conversion pause. Now in sinus bradycardia with HR in the 40s-50s. Stopped metoprolol . Continue to monitor on telemetry.   Rollo FABIENE Louder, PA-C 01/04/2024 3:32 PM  Discussed briefly with EP who recommended resuming PO amiodarone . No metoprolol . They will evaluate on Monday. Dr. Roann notified via secure chat  Rollo FABIENE Louder, PA-C 01/04/2024 3:58 PM

## 2024-01-04 NOTE — Progress Notes (Addendum)
 Reviewed with EP MD > initial thought was to resume amiodarone .  However, after tele review (AF > then post conversion pause after 12.5mg  PO lopressor   to SB with intermittent junctional rhythm), will hold amiodarone  and all AV nodal blockers for now.  Given degree of hypothyroidism, would favor aggressive treatment of hypothyroidism.  If remains bradycardic after correction, would consider PPM.  Consider IV replacement of synthroid. Formal EP consult to follow on 01/07/24.   Daphne Barrack, NP-C, AGACNP-BC [facility] - Electrophysiology  01/04/2024, 5:32 PM

## 2024-01-04 NOTE — Progress Notes (Signed)
 PT Progress Note  Pulmonary assessment  PT eval completed, note to follow. pulmonary assessment completed, SpO2 maintained throughout at 93-95% on RA with activity/gait. HR lability noted. Brady to 44 bpm (asymptomatic). HR 80's/90's for half of the gait trial and tachy through the 130's to max of 163 bpm and sustained in the upper 120's until therapist left 10+ min after gait/pulm assessment completed. Pt was again generally asymptomatic, but was mildly winded from return that coincided with the tachycardia. Pt would benefit from and wants ST HHPT, no DME needed.   01/04/2024  India HERO., PT Acute Rehabilitation Services [phone]  (office)

## 2024-01-05 DIAGNOSIS — I10 Essential (primary) hypertension: Secondary | ICD-10-CM | POA: Diagnosis not present

## 2024-01-05 DIAGNOSIS — Z6841 Body Mass Index (BMI) 40.0 and over, adult: Secondary | ICD-10-CM | POA: Diagnosis not present

## 2024-01-05 DIAGNOSIS — I5033 Acute on chronic diastolic (congestive) heart failure: Secondary | ICD-10-CM | POA: Diagnosis not present

## 2024-01-05 DIAGNOSIS — E1122 Type 2 diabetes mellitus with diabetic chronic kidney disease: Secondary | ICD-10-CM | POA: Diagnosis present

## 2024-01-05 DIAGNOSIS — D649 Anemia, unspecified: Secondary | ICD-10-CM | POA: Diagnosis present

## 2024-01-05 DIAGNOSIS — Z79899 Other long term (current) drug therapy: Secondary | ICD-10-CM | POA: Diagnosis not present

## 2024-01-05 DIAGNOSIS — I4892 Unspecified atrial flutter: Secondary | ICD-10-CM | POA: Diagnosis present

## 2024-01-05 DIAGNOSIS — I4891 Unspecified atrial fibrillation: Secondary | ICD-10-CM | POA: Diagnosis not present

## 2024-01-05 DIAGNOSIS — Z8673 Personal history of transient ischemic attack (TIA), and cerebral infarction without residual deficits: Secondary | ICD-10-CM | POA: Diagnosis not present

## 2024-01-05 DIAGNOSIS — J4530 Mild persistent asthma, uncomplicated: Secondary | ICD-10-CM | POA: Diagnosis present

## 2024-01-05 DIAGNOSIS — N189 Chronic kidney disease, unspecified: Secondary | ICD-10-CM | POA: Diagnosis present

## 2024-01-05 DIAGNOSIS — I480 Paroxysmal atrial fibrillation: Secondary | ICD-10-CM | POA: Diagnosis present

## 2024-01-05 DIAGNOSIS — Z8249 Family history of ischemic heart disease and other diseases of the circulatory system: Secondary | ICD-10-CM | POA: Diagnosis not present

## 2024-01-05 DIAGNOSIS — G4733 Obstructive sleep apnea (adult) (pediatric): Secondary | ICD-10-CM | POA: Diagnosis present

## 2024-01-05 DIAGNOSIS — Z87891 Personal history of nicotine dependence: Secondary | ICD-10-CM | POA: Diagnosis not present

## 2024-01-05 DIAGNOSIS — E039 Hypothyroidism, unspecified: Secondary | ICD-10-CM | POA: Diagnosis present

## 2024-01-05 DIAGNOSIS — R001 Bradycardia, unspecified: Secondary | ICD-10-CM | POA: Diagnosis present

## 2024-01-05 DIAGNOSIS — G40909 Epilepsy, unspecified, not intractable, without status epilepticus: Secondary | ICD-10-CM | POA: Diagnosis present

## 2024-01-05 DIAGNOSIS — I959 Hypotension, unspecified: Secondary | ICD-10-CM | POA: Diagnosis not present

## 2024-01-05 DIAGNOSIS — I130 Hypertensive heart and chronic kidney disease with heart failure and stage 1 through stage 4 chronic kidney disease, or unspecified chronic kidney disease: Secondary | ICD-10-CM | POA: Diagnosis present

## 2024-01-05 DIAGNOSIS — Z79890 Hormone replacement therapy (postmenopausal): Secondary | ICD-10-CM | POA: Diagnosis not present

## 2024-01-05 DIAGNOSIS — J4489 Other specified chronic obstructive pulmonary disease: Secondary | ICD-10-CM | POA: Diagnosis present

## 2024-01-05 DIAGNOSIS — Z85828 Personal history of other malignant neoplasm of skin: Secondary | ICD-10-CM | POA: Diagnosis not present

## 2024-01-05 DIAGNOSIS — I495 Sick sinus syndrome: Secondary | ICD-10-CM | POA: Diagnosis present

## 2024-01-05 DIAGNOSIS — E785 Hyperlipidemia, unspecified: Secondary | ICD-10-CM | POA: Diagnosis present

## 2024-01-05 DIAGNOSIS — I4510 Unspecified right bundle-branch block: Secondary | ICD-10-CM | POA: Diagnosis present

## 2024-01-05 DIAGNOSIS — E66813 Obesity, class 3: Secondary | ICD-10-CM | POA: Diagnosis present

## 2024-01-05 DIAGNOSIS — D6869 Other thrombophilia: Secondary | ICD-10-CM | POA: Diagnosis not present

## 2024-01-05 DIAGNOSIS — N179 Acute kidney failure, unspecified: Secondary | ICD-10-CM | POA: Diagnosis present

## 2024-01-05 LAB — CBC
HCT: 33.7 % — ABNORMAL LOW (ref 36.0–46.0)
Hemoglobin: 9.8 g/dL — ABNORMAL LOW (ref 12.0–15.0)
MCH: 23.4 pg — ABNORMAL LOW (ref 26.0–34.0)
MCHC: 29.1 g/dL — ABNORMAL LOW (ref 30.0–36.0)
MCV: 80.6 fL (ref 80.0–100.0)
Platelets: 163 K/uL (ref 150–400)
RBC: 4.18 MIL/uL (ref 3.87–5.11)
RDW: 22.7 % — ABNORMAL HIGH (ref 11.5–15.5)
WBC: 4.9 K/uL (ref 4.0–10.5)
nRBC: 0 % (ref 0.0–0.2)

## 2024-01-05 LAB — COMPREHENSIVE METABOLIC PANEL WITH GFR
ALT: 10 U/L (ref 0–44)
AST: 18 U/L (ref 15–41)
Albumin: 3.3 g/dL — ABNORMAL LOW (ref 3.5–5.0)
Alkaline Phosphatase: 38 U/L (ref 38–126)
Anion gap: 12 (ref 5–15)
BUN: 14 mg/dL (ref 8–23)
CO2: 28 mmol/L (ref 22–32)
Calcium: 9.1 mg/dL (ref 8.9–10.3)
Chloride: 102 mmol/L (ref 98–111)
Creatinine, Ser: 1.25 mg/dL — ABNORMAL HIGH (ref 0.44–1.00)
GFR, Estimated: 43 mL/min — ABNORMAL LOW (ref >60)
Glucose, Bld: 175 mg/dL — ABNORMAL HIGH (ref 70–99)
Potassium: 4.1 mmol/L (ref 3.5–5.1)
Sodium: 142 mmol/L (ref 135–145)
Total Bilirubin: 0.7 mg/dL (ref 0.0–1.2)
Total Protein: 6.3 g/dL — ABNORMAL LOW (ref 6.5–8.1)

## 2024-01-05 LAB — MAGNESIUM: Magnesium: 2.3 mg/dL (ref 1.7–2.4)

## 2024-01-05 MED ORDER — LEVOTHYROXINE SODIUM 100 MCG/5ML IV SOLN
75.0000 ug | Freq: Every day | INTRAVENOUS | Status: DC
Start: 2024-01-05 — End: 2024-01-11
  Administered 2024-01-05 – 2024-01-11 (×7): 75 ug via INTRAVENOUS
  Filled 2024-01-05 (×8): qty 5

## 2024-01-05 NOTE — Plan of Care (Signed)

## 2024-01-05 NOTE — Progress Notes (Signed)
 Rounding Note   Patient Name: Kim Dixon Date of Encounter: 01/05/2024  [facility] Cardiologist: Jayson Sierras, MD   Subjective - No acute events overnight - Patient stable today without complaints of chest pain, presyncope, syncope or SOB.  Wearing oxygen in bed.  Scheduled Meds:  enoxaparin (LOVENOX) injection  40 mg Subcutaneous Q24H   fluticasone  furoate-vilanterol  1 puff Inhalation QPM   levETIRAcetam  250 mg Oral BID   levothyroxine  88 mcg Oral QAC breakfast   Continuous Infusions:  PRN Meds: acetaminophen  **OR** acetaminophen , ondansetron  **OR** ondansetron  (ZOFRAN ) IV, polyethylene glycol   Vital Signs  Vitals:   01/04/24 1605 01/04/24 2321 01/05/24 0321 01/05/24 0800  BP: 114/62 (!) 140/76 137/79 (!) 154/65  Pulse: (!) 37 (!) 110 (!) 109 (!) 50  Resp: 16 16 16 17   Temp: 98.3 F (36.8 C) 98.1 F (36.7 C) 97.9 F (36.6 C) 98.3 F (36.8 C)  TempSrc: Oral Oral Oral Oral  SpO2: 100% 95% 95% 100%  Weight:      Height:        Intake/Output Summary (Last 24 hours) at 01/05/2024 0819 Last data filed at 01/05/2024 0556 Gross per 24 hour  Intake 600 ml  Output --  Net 600 ml      01/02/2024    3:32 PM 01/02/2024    2:35 PM 10/23/2023    7:16 AM  Last 3 Weights  Weight (lbs) 264 lb 15.9 oz 265 lb 265 lb  Weight (kg) 120.2 kg 120.203 kg 120.203 kg      Telemetry Sinus bradycardia, occasional junctional escape complexes, occasional PVCs, a 6.8-second sinus pause overnight- Personally Reviewed  ECG  No new ECG  Physical Exam  GEN: No acute distress.   Neck: No JVD Cardiac: RRR, no murmurs, rubs, or gallops.  Respiratory: Faint bibasilar rales, no wheezes or rhonchi GI: Soft, nontender, non-distended  MS: No edema; No deformity. Neuro:  Nonfocal  Psych: Normal affect   Labs High Sensitivity Troponin:  No results for input(s): TROPONINIHS in the last 720 hours.   Chemistry Recent Labs  Lab 01/02/24 1557 01/03/24 0452  01/04/24 0419 01/04/24 1556 01/05/24 0450  NA 140 141 142 141  --   K 4.3 4.3 3.8 4.0  --   CL 100 101 103 100  --   CO2 27 32 31 33*  --   GLUCOSE 121* 108* 101* 149*  --   BUN 19 15 14 13   --   CREATININE 1.81* 1.65* 1.37* 1.29*  --   CALCIUM 8.7* 8.5* 8.7* 8.7*  --   MG 2.6*  --   --   --  2.3  PROT 7.5  --   --  5.4*  --   ALBUMIN 4.3  --   --  2.7*  --   AST 19  --   --  19  --   ALT 9  --   --  9  --   ALKPHOS 57  --   --  38  --   BILITOT 0.7  --   --  0.7  --   GFRNONAA 27* 31* 38* 41*  --   ANIONGAP 13 8 8 8   --     Lipids No results for input(s): CHOL, TRIG, HDL, LABVLDL, LDLCALC, CHOLHDL in the last 168 hours.  Hematology Recent Labs  Lab 01/02/24 1557 01/03/24 0452 01/05/24 0450  WBC 4.8 4.2 4.9  RBC 4.48 3.94 4.18  HGB 10.6* 9.3* 9.8*  HCT  36.6 31.8* 33.7*  MCV 81.7 80.7 80.6  MCH 23.7* 23.6* 23.4*  MCHC 29.0* 29.2* 29.1*  RDW 22.9* 22.5* 22.7*  PLT 183 155 163   Thyroid   Recent Labs  Lab 01/02/24 1557 01/02/24 1822  TSH 78.200*  --   FREET4  --  0.35*    BNP Recent Labs  Lab 01/02/24 1557  PROBNP 552.0*    DDimer No results for input(s): DDIMER in the last 168 hours.   Radiology  ECHOCARDIOGRAM COMPLETE Result Date: 01/03/2024    ECHOCARDIOGRAM REPORT   Patient Name:   Kim Dixon Date of Exam: 01/03/2024 Medical Rec #:  [id number]          Height:       63.0 in Accession #:    [id number]         Weight:       265.0 lb Date of Birth:  01-Dec-1940          BSA:          2.179 m Patient Age:    83 years           BP:           125/61 mmHg Patient Gender: F                  HR:           45 bpm. Exam Location:  Inpatient Procedure: 2D Echo, Cardiac Doppler and Color Doppler (Both Spectral and Color            Flow Doppler were utilized during procedure). Indications:    Bradycardia  History:        Patient has prior history of Echocardiogram examinations, most                 recent 09/30/2019. TIA, Arrythmias:Bradycardia and  Atrial                 Fibrillation, Signs/Symptoms:Hypotension and Dyspnea; Risk                 Factors:Hypertension and Former Smoker.  Sonographer:    Juliene Rucks Referring Phys: [phone] EJIROGHENE E EMOKPAE  Sonographer Comments: Patient is obese. Image acquisition challenging due to patient body habitus. IMPRESSIONS  1. Left ventricular ejection fraction, by estimation, is 60 to 65%. The left ventricle has normal function. The left ventricle has no regional wall motion abnormalities. There is mild concentric left ventricular hypertrophy. Left ventricular diastolic parameters are consistent with Grade III diastolic dysfunction (restrictive). Elevated left atrial pressure.  2. Right ventricular systolic function is normal. The right ventricular size is normal.  3. Left atrial size was mildly dilated.  4. Right atrial size was mildly dilated.  5. The mitral valve is normal in structure. Mild mitral valve regurgitation. No evidence of mitral stenosis.  6. The aortic valve is normal in structure. Aortic valve regurgitation is not visualized. No aortic stenosis is present.  7. The inferior vena cava is normal in size with greater than 50% respiratory variability, suggesting right atrial pressure of 3 mmHg. FINDINGS  Left Ventricle: Left ventricular ejection fraction, by estimation, is 60 to 65%. The left ventricle has normal function. The left ventricle has no regional wall motion abnormalities. The left ventricular internal cavity size was normal in size. There is  mild concentric left ventricular hypertrophy. Left ventricular diastolic parameters are consistent with Grade III diastolic dysfunction (restrictive). Elevated left atrial pressure. Right Ventricle: The right ventricular size is normal.  No increase in right ventricular wall thickness. Right ventricular systolic function is normal. Left Atrium: Left atrial size was mildly dilated. Right Atrium: Right atrial size was mildly dilated. Pericardium: There is no  evidence of pericardial effusion. Presence of epicardial fat layer. Mitral Valve: The mitral valve is normal in structure. Mild to moderate mitral annular calcification. Mild mitral valve regurgitation. No evidence of mitral valve stenosis. Tricuspid Valve: The tricuspid valve is normal in structure. Tricuspid valve regurgitation is not demonstrated. No evidence of tricuspid stenosis. Aortic Valve: The aortic valve is normal in structure. Aortic valve regurgitation is not visualized. No aortic stenosis is present. Pulmonic Valve: The pulmonic valve was normal in structure. Pulmonic valve regurgitation is not visualized. No evidence of pulmonic stenosis. Aorta: The aortic root and ascending aorta are structurally normal, with no evidence of dilitation. Venous: The inferior vena cava is normal in size with greater than 50% respiratory variability, suggesting right atrial pressure of 3 mmHg. IAS/Shunts: No atrial level shunt detected by color flow Doppler.  RIGHT VENTRICLE RV Basal diam:  3.70 cm RV Mid diam:    2.90 cm LEFT ATRIUM           Index        RIGHT ATRIUM           Index LA Vol (A2C): 84.0 ml 38.54 ml/m  RA Area:     23.70 cm LA Vol (A4C): 78.8 ml 36.16 ml/m  RA Volume:   72.30 ml  33.18 ml/m   AORTA Ao Root diam: 2.70 cm Ao Asc diam:  3.20 cm MR Peak grad: 75.3 mmHg MR Vmax:      434.00 cm/s Kardie Tobb DO Electronically signed by Dub Huntsman DO Signature Date/Time: 01/03/2024/2:10:25 PM    Final     Cardiac Studies  TTE 01/03/24:  IMPRESSIONS     1. Left ventricular ejection fraction, by estimation, is 60 to 65%. The  left ventricle has normal function. The left ventricle has no regional  wall motion abnormalities. There is mild concentric left ventricular  hypertrophy. Left ventricular diastolic  parameters are consistent with Grade III diastolic dysfunction  (restrictive). Elevated left atrial pressure.   2. Right ventricular systolic function is normal. The right ventricular  size  is normal.   3. Left atrial size was mildly dilated.   4. Right atrial size was mildly dilated.   5. The mitral valve is normal in structure. Mild mitral valve  regurgitation. No evidence of mitral stenosis.   6. The aortic valve is normal in structure. Aortic valve regurgitation is  not visualized. No aortic stenosis is present.   7. The inferior vena cava is normal in size with greater than 50%  respiratory variability, suggesting right atrial pressure of 3 mmHg.   Patient Profile   Kim Dixon is a 83 y.o. female with a PMH of HFpEF (60-65% 2021), recently diagnosed PAF (5/25) not on AC due to GIB who presented with symptomatic bradycardia and hypotension and found to be in junctional rhythm.   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  Patient is currently asymptomatic and was asleep during them.  Unclear to me if this is being driven by a primary cardiac issue or her new hypothyroidism.  EP following and will evaluate on Monday for possible PPM. -Continue holding metoprolol  and amiodarone  -Follow-up EP recs - Not on OAC due to bleeding  #HFpEF -Appears euvolemic  For questions or updates, please contact [facility] Please consult [email or web address] for contact info under       Signed, Georganna Archer, MD  01/05/2024, 8:19 AM

## 2024-01-05 NOTE — Progress Notes (Signed)
 Progress Note   Patient: Kim Dixon FMW:[id number] DOB: 04-24-1940 DOA: 01/02/2024     0 DOS: the patient was seen and examined on 01/05/2024   Brief hospital course: Kim Dixon is a 83 y.o. female with medical history significant for atrial fibrillation, hypertension, asthma, depression, OSA. Patient was sent to the ED from Dr. Chari (pulmonologist) office reports of low heart rate and low blood pressure.  Patient reports over the past 2 months, she has had increasing fatigue, increased somnolence, sleeping for several hours at a time, difficulty breathing with exertion, and some chest tightness when she is short of breath otherwise no chest pain.  She reports nearly passing out also. Patient is on metoprolol  and amiodarone , her last dose of metoprolol  was about 12 noon today.  She does not take her medications at a consistent time daily.  She believes she took amiodarone  this morning also.  She does not check her vitals at home.  She reports she saw her outpatient provider Dr. Sheryle about 2 weeks ago, and they were not happy about her vitals. Patient's amiodarone  and metoprolol  were held.  Patient went into atrial fibrillation with rapid ventricular response and initially metoprolol  was started.  Then patient went down to bradycardia and pauses.  Metoprolol  was discontinued and amiodarone  was added.  EP and cardiology came back and forth and advised that patient needs to have a pacemaker.  EP advised for IV levothyroxine.  Patient has been placed on levothyroxine 75 mics IV daily.  Assessment and Plan:  Symptomatic bradycardia with hypotension - Looks like due to severe hypothyroidism, one episode of pause 10/17 - chest x-ray- negative for acute abnormality -/P IV fluid - TSH- quite elevated at 78.2, last checked 2 months ago -21, not on Synthroid, started her on levothyroxine, EP team advised for IV started on IV 10/18 - Magnesium -2.6, potassium 4.3.  Troponin 21 - EF:  60-65% - Went into afib night of 10/17, started on Metoprolol , now went into pauses, card advised to hold metoprolol . - Likely need pacemaker placement next week.      AKI -creatinine 1.8, 1.29 10/18, back to baseline, d/c IVF 10/17 - Fluids per above - Trend lactic acid - Hold torsemide  20 mg daily, losartan  25 mg daily resume when appropriate   Hypothyroidism-TSH markedly elevated at 78.2.  With symptomatic bradycardia, fatigue, somnolence X 2 months. - Awake and alert on my evaluation. - Low T3, free T4 - Initially started on Synthroid 88 mcg daily but EP advised for IV, changed to 75 mics of IV levothyroxine 10/18   Atrial fibrillation -presenting with marked sinus bradycardia. - Held amiodarone  and metoprolol  - No anticoagulation due to history of GI bleed   Hypertension -currently hypotensive - Hold losartan , metoprolol , torsemide    Diastolic CHF -stable and compensated. - Last echo 09/2019 EF 60 to 65%. - Hold torsemide  with hypotension, may resume later when she is stable  Seizure disorder - Continue Keppra home dose 250 twice daily             Subjective: Denies any chest pain or dizziness  Physical Exam: Vitals:   01/04/24 1605 01/04/24 2321 01/05/24 0321 01/05/24 0800  BP: 114/62 (!) 140/76 137/79 (!) 154/65  Pulse: (!) 37 (!) 110 (!) 109 (!) 50  Resp: 16 16 16 17   Temp: 98.3 F (36.8 C) 98.1 F (36.7 C) 97.9 F (36.6 C) 98.3 F (36.8 C)  TempSrc: Oral Oral Oral Oral  SpO2: 100% 95% 95% 100%  Weight:  Height:       Constitutional: Alert, awake, calm, comfortable HEENT: Neck supple Respiratory: clear to auscultation bilaterally, no wheezing, no crackles. Normal respiratory effort. No accessory muscle use.  Cardiovascular: Regular rate and rhythm, no murmurs / rubs / gallops. No extremity edema. 2+ pedal pulses. No carotid bruits.  Abdomen: no tenderness, no masses palpated. No hepatosplenomegaly. Bowel sounds positive.  Morbidly obese  abdomen Musculoskeletal: no clubbing / cyanosis. No joint deformity upper and lower extremities. Good ROM, no contractures. Normal muscle tone.  Skin: no rashes, lesions, ulcers. No induration Neurologic: CN 2-12 grossly intact. Sensation intact, DTR normal. Strength 5/5 x all 4 extremities.  Psychiatric: Normal judgment and insight. Alert and oriented x 3. Normal mood.   Data Reviewed:  Reviewed  Family Communication: With family 10/17  Disposition: Status is: Observation The patient remains OBS appropriate and will d/c before 2 midnights.  Planned Discharge Destination: Home    Time spent: 35 minutes  Author: Nena Rebel, MD 01/05/2024 12:05 PM  For on call review [email or web address].

## 2024-01-05 NOTE — Progress Notes (Signed)
 Mobility Specialist Progress Note:    01/05/24 1007  Mobility  Activity Ambulated with assistance  Level of Assistance Contact guard assist, steadying assist  Assistive Device None  Distance Ambulated (ft) 200 ft  Activity Response Tolerated well  Mobility Referral Yes  Mobility visit 1 Mobility  Mobility Specialist Start Time (ACUTE ONLY) 1007  Mobility Specialist Stop Time (ACUTE ONLY) 1016  Mobility Specialist Time Calculation (min) (ACUTE ONLY) 9 min   Pt received in bed, agreeable to mobility session. Ambulated in hallway, no AD required. MinG for safety. Tolerated well, took one standing rest break before returning to room. Requested to use bathroom. Void successful. Pt agreeable to sit in chair. Left with all needs met, call bell in reach.   Stephen Turnbaugh Mobility Specialist Please contact via Special educational needs teacher or  Rehab office at [phone]

## 2024-01-05 NOTE — Plan of Care (Signed)
   Problem: Clinical Measurements: Goal: Cardiovascular complication will be avoided Outcome: Progressing

## 2024-01-06 DIAGNOSIS — R001 Bradycardia, unspecified: Secondary | ICD-10-CM | POA: Diagnosis not present

## 2024-01-06 LAB — BASIC METABOLIC PANEL WITH GFR
Anion gap: 9 (ref 5–15)
BUN: 9 mg/dL (ref 8–23)
CO2: 33 mmol/L — ABNORMAL HIGH (ref 22–32)
Calcium: 9 mg/dL (ref 8.9–10.3)
Chloride: 101 mmol/L (ref 98–111)
Creatinine, Ser: 1.1 mg/dL — ABNORMAL HIGH (ref 0.44–1.00)
GFR, Estimated: 50 mL/min — ABNORMAL LOW (ref >60)
Glucose, Bld: 114 mg/dL — ABNORMAL HIGH (ref 70–99)
Potassium: 4 mmol/L (ref 3.5–5.1)
Sodium: 143 mmol/L (ref 135–145)

## 2024-01-06 LAB — CBC
HCT: 36 % (ref 36.0–46.0)
Hemoglobin: 10.3 g/dL — ABNORMAL LOW (ref 12.0–15.0)
MCH: 23.4 pg — ABNORMAL LOW (ref 26.0–34.0)
MCHC: 28.6 g/dL — ABNORMAL LOW (ref 30.0–36.0)
MCV: 81.8 fL (ref 80.0–100.0)
Platelets: 169 K/uL (ref 150–400)
RBC: 4.4 MIL/uL (ref 3.87–5.11)
RDW: 22.6 % — ABNORMAL HIGH (ref 11.5–15.5)
WBC: 5.4 K/uL (ref 4.0–10.5)
nRBC: 0 % (ref 0.0–0.2)

## 2024-01-06 LAB — SURGICAL PCR SCREEN
MRSA, PCR: NEGATIVE
Staphylococcus aureus: NEGATIVE

## 2024-01-06 LAB — MAGNESIUM: Magnesium: 2.2 mg/dL (ref 1.7–2.4)

## 2024-01-06 MED ORDER — ORAL CARE MOUTH RINSE: 15.0000 mL | OROMUCOSAL | Status: DC | PRN

## 2024-01-06 NOTE — Progress Notes (Signed)
 Progress Note  Patient Name: COBIE LEIDNER Date of Encounter: 01/06/2024  Primary Cardiologist: Jayson Sierras, MD   Subjective   No chest pain or sob.   Inpatient Medications    Scheduled Meds:  enoxaparin (LOVENOX) injection  40 mg Subcutaneous Q24H   fluticasone  furoate-vilanterol  1 puff Inhalation QPM   levETIRAcetam  250 mg Oral BID   levothyroxine  75 mcg Intravenous Daily   Continuous Infusions:  PRN Meds: acetaminophen  **OR** acetaminophen , ondansetron  **OR** ondansetron  (ZOFRAN ) IV, polyethylene glycol   Vital Signs    Vitals:   01/06/24 0325 01/06/24 0745 01/06/24 0944 01/06/24 0955  BP: 137/64 132/78    Pulse: 92  (!) 115 (!) 116  Resp:  19    Temp: 98.2 F (36.8 C) 98 F (36.7 C)    TempSrc: Oral Oral    SpO2: 95%  (!) 84% 92%  Weight:      Height:       No intake or output data in the 24 hours ending 01/06/24 1055 Filed Weights   01/02/24 1532  Weight: 120.2 kg    Telemetry    Atrial fib with a RVR - Personally Reviewed  ECG   none - Personally Reviewed  Physical Exam   GEN: No acute distress.   Neck: No JVD Cardiac: IRIRR, no murmurs, rubs, or gallops.  Respiratory: Clear to auscultation bilaterally. GI: Soft, nontender, non-distended  MS: No edema; No deformity. Neuro:  Nonfocal  Psych: Normal affect   Labs    Chemistry Recent Labs  Lab 01/02/24 1557 01/03/24 0452 01/04/24 1556 01/05/24 1219 01/06/24 0448  NA 140   < > 141 142 143  K 4.3   < > 4.0 4.1 4.0  CL 100   < > 100 102 101  CO2 27   < > 33* 28 33*  GLUCOSE 121*   < > 149* 175* 114*  BUN 19   < > 13 14 9   CREATININE 1.81*   < > 1.29* 1.25* 1.10*  CALCIUM 8.7*   < > 8.7* 9.1 9.0  PROT 7.5  --  5.4* 6.3*  --   ALBUMIN 4.3  --  2.7* 3.3*  --   AST 19  --  19 18  --   ALT 9  --  9 10  --   ALKPHOS 57  --  38 38  --   BILITOT 0.7  --  0.7 0.7  --   GFRNONAA 27*   < > 41* 43* 50*  ANIONGAP 13   < > 8 12 9    < > = values in this interval not displayed.      Hematology Recent Labs  Lab 01/03/24 0452 01/05/24 0450 01/06/24 0448  WBC 4.2 4.9 5.4  RBC 3.94 4.18 4.40  HGB 9.3* 9.8* 10.3*  HCT 31.8* 33.7* 36.0  MCV 80.7 80.6 81.8  MCH 23.6* 23.4* 23.4*  MCHC 29.2* 29.1* 28.6*  RDW 22.5* 22.7* 22.6*  PLT 155 163 169    Cardiac EnzymesNo results for input(s): TROPONINI in the last 168 hours. No results for input(s): TROPIPOC in the last 168 hours.   BNP Recent Labs  Lab 01/02/24 1557  PROBNP 552.0*     DDimer No results for input(s): DDIMER in the last 168 hours.   Radiology    No results found.  Cardiac Studies   none  Patient Profile     83 y.o. female admitted with junctional rhythm in the 20's and 30's on  amio and toprol , now back in atrial fib with a rvr.   Assessment & Plan    Atrial fib - she has tachy-brady and will need PPM as she is not a candidate for afib ablation (age and obesity), and once her PPM is in place he AV nodal blocking drugs can be restarted.  Coags -on lovenox sq. Once her PPM is in place she can be restarted on an OAC or warfarin.     For questions or updates, please contact [facility] Please consult [email or web address] for contact info under Cardiology/STEMI.      Signed, Danelle Birmingham, MD  01/06/2024, 10:55 AM

## 2024-01-06 NOTE — Progress Notes (Signed)
 PROGRESS NOTE  Kim Dixon FMW:[id number] DOB: 12-14-1940 DOA: 01/02/2024 PCP: Sheryle Carwin, MD   LOS: 1 day   Brief narrative:  Kim Dixon is a 83 y.o. female with medical history significant for atrial fibrillation, hypertension, asthma, depression, OSA was sent from pulmonary office with complaints of low heart rate and low blood pressure.  Patient reported that over the last 2 months she had increasing fatigue somnolence with difficulty breathing especially on exertion with chest tightness and near passing out spells.  Patient was on metoprolol  and amiodarone  at home.  She had seen her outpatient provider and amiodarone  and metoprolol  were held.  Patient was then out of the hospital for further evaluation and treatment    Assessment/Plan: Principal Problem:   Symptomatic bradycardia Active Problems:   Hypotension   Atrial fibrillation (HCC)   Essential hypertension   OSA (obstructive sleep apnea)   Mild persistent asthma  Symptomatic bradycardia with hypotension Likely secondary to severe hypothyroidism with episode of pauses on 01/04/2024.  Chest x-ray without any acute abnormality.  Initially received IV fluids.  Has been started on Synthroid.  2D echocardiogram with a EF of 60 to 65%.  Continue to hold metoprolol  due to pauses and bradycardia. Likely need pacemaker placement      AKI Initial creatinine of 1.3.  Torsemide  and losartan  on hold.  Latest creatinine of 1.1.   Hypothyroidism- With symptomatic bradycardia fatigue weakness.  TSH markedly elevated at 78.2 with a low T3 and free T4.  Started on 88 mcg daily but electrophysiology advised for IV show has been just to 75 mcg since at 01/05/2024.  Hold amiodarone .   Atrial fibrillation Tachybradycardia syndrome Presented with marked sinus bradycardia.  Metoprolol  and amiodarone  on hold.  Patient had pauses and cardiology advised to hold metoprolol .  No anticoagulation due to history of GI bleed.  Heart rate has  improved at this time.  Patient is not a candidate for atrial fibrillation ablation due to age and obesity.  Plan for pacemaker placement.   Hypertension Antihypertensives including losartan , metoprolol , torsemide  on hold due to low blood pressure.  Blood pressure is starting to improve.   Diastolic CHF Compensated.  Review of last 2D echocardiogram from 721 with a EF of 60 to 65%.  Torsemide  on hold at this time   Seizure disorder Continue Keppra  Class III obesity.Body mass index is 46.94 kg/m.  Patient would benefit from weight loss as outpatient.  Obstructive sleep apnea.  Patient uses CPAP at nighttime.  Will resume while in the hospital.  DVT prophylaxis: enoxaparin (LOVENOX) injection 40 mg Start: 01/02/24 2230   Disposition: Likely home with home health in 1 to 2 days  Status is: Inpatient Remains inpatient appropriate because: Pending clinical improvement, need for pacemaker placement    Code Status:     Code Status: Full Code  Family Communication: None at bedside  Consultants: Cardiology Electrophysiology cardiology  Procedures: None yet  Anti-infectives:  None  Anti-infectives (From admission, onward)    None        Subjective: Today, patient was seen and examined at bedside.  Patient denies any chest pain, shortness of breath, dyspnea fever or chills.  Objective: Vitals:   01/06/24 0955 01/06/24 1234  BP:  (!) 144/91  Pulse: (!) 116   Resp:  18  Temp:  97.9 F (36.6 C)  SpO2: 92%    No intake or output data in the 24 hours ending 01/06/24 1415 Filed Weights   01/02/24 1532  Weight: 120.2 kg   Body mass index is 46.94 kg/m.   Physical Exam: GENERAL: Patient is alert awake and oriented. Not in obvious distress.  Obese.  On nasal cannula oxygen HENT: No scleral pallor or icterus. Pupils equally reactive to light. Oral mucosa is moist NECK: is supple, no gross swelling noted. CHEST: Clear to auscultation. No crackles or wheezes.   Diminished breath sounds bilaterally. CVS: S1 and S2 heard, no murmur.  Irregularly irregular rhythm with tachycardia at the time of my exam. ABDOMEN: Soft, non-tender, bowel sounds are present. EXTREMITIES: Trace bilateral lower extremity edema CNS: Cranial nerves are intact. No focal motor deficits. SKIN: warm and dry without rashes.  Data Review: I have personally reviewed the following laboratory data and studies,  CBC: Recent Labs  Lab 01/02/24 1557 01/03/24 0452 01/05/24 0450 01/06/24 0448  WBC 4.8 4.2 4.9 5.4  NEUTROABS 2.9  --   --   --   HGB 10.6* 9.3* 9.8* 10.3*  HCT 36.6 31.8* 33.7* 36.0  MCV 81.7 80.7 80.6 81.8  PLT 183 155 163 169   Basic Metabolic Panel: Recent Labs  Lab 01/02/24 1557 01/03/24 0452 01/04/24 0419 01/04/24 1556 01/05/24 0450 01/05/24 1219 01/06/24 0448  NA 140 141 142 141  --  142 143  K 4.3 4.3 3.8 4.0  --  4.1 4.0  CL 100 101 103 100  --  102 101  CO2 27 32 31 33*  --  28 33*  GLUCOSE 121* 108* 101* 149*  --  175* 114*  BUN 19 15 14 13   --  14 9  CREATININE 1.81* 1.65* 1.37* 1.29*  --  1.25* 1.10*  CALCIUM 8.7* 8.5* 8.7* 8.7*  --  9.1 9.0  MG 2.6*  --   --   --  2.3  --  2.2   Liver Function Tests: Recent Labs  Lab 01/02/24 1557 01/04/24 1556 01/05/24 1219  AST 19 19 18   ALT 9 9 10   ALKPHOS 57 38 38  BILITOT 0.7 0.7 0.7  PROT 7.5 5.4* 6.3*  ALBUMIN 4.3 2.7* 3.3*   No results for input(s): LIPASE, AMYLASE in the last 168 hours. No results for input(s): AMMONIA in the last 168 hours. Cardiac Enzymes: No results for input(s): CKTOTAL, CKMB, CKMBINDEX, TROPONINI in the last 168 hours. BNP (last 3 results) Recent Labs    08/27/23 0859  BNP 430.0*    ProBNP (last 3 results) Recent Labs    01/02/24 1557  PROBNP 552.0*    CBG: Recent Labs  Lab 01/02/24 1635  GLUCAP 120*   No results found for this or any previous visit (from the past 240 hours).   Studies: No results found.    Vernal Alstrom,  MD  [facility] 01/06/2024  If 7PM-7AM, please contact night-coverage

## 2024-01-06 NOTE — Progress Notes (Signed)
 Mobility Specialist Progress Note:   01/06/24 1505  Mobility  Activity Ambulated with assistance  Level of Assistance Standby assist, set-up cues, supervision of patient - no hands on  Assistive Device None  Distance Ambulated (ft) 250 ft  Activity Response Tolerated well  Mobility Referral Yes  Mobility visit 1 Mobility  Mobility Specialist Start Time (ACUTE ONLY) 1505  Mobility Specialist Stop Time (ACUTE ONLY) 1520  Mobility Specialist Time Calculation (min) (ACUTE ONLY) 15 min   Pt received in chair, eager for mobility session. Ambulated in hallway with SV. No AD required. SpO2 WFL, audible SOB near EOS. Max HR 135 bpm. Returned pt to room, sitting up with all needs met. Call bell in reach.   Raeven Pint Mobility Specialist Please contact via Special educational needs teacher or  Rehab office at [phone]

## 2024-01-06 NOTE — Progress Notes (Signed)
 Placed patient on CPAP for the night via ayo-mode with oxygen set at 2lpm

## 2024-01-07 DIAGNOSIS — I480 Paroxysmal atrial fibrillation: Secondary | ICD-10-CM | POA: Diagnosis not present

## 2024-01-07 DIAGNOSIS — R001 Bradycardia, unspecified: Secondary | ICD-10-CM | POA: Diagnosis not present

## 2024-01-07 DIAGNOSIS — D6869 Other thrombophilia: Secondary | ICD-10-CM

## 2024-01-07 DIAGNOSIS — I495 Sick sinus syndrome: Secondary | ICD-10-CM

## 2024-01-07 LAB — CBC
HCT: 33.5 % — ABNORMAL LOW (ref 36.0–46.0)
Hemoglobin: 9.8 g/dL — ABNORMAL LOW (ref 12.0–15.0)
MCH: 23.3 pg — ABNORMAL LOW (ref 26.0–34.0)
MCHC: 29.3 g/dL — ABNORMAL LOW (ref 30.0–36.0)
MCV: 79.6 fL — ABNORMAL LOW (ref 80.0–100.0)
Platelets: 165 K/uL (ref 150–400)
RBC: 4.21 MIL/uL (ref 3.87–5.11)
RDW: 22.7 % — ABNORMAL HIGH (ref 11.5–15.5)
WBC: 5.1 K/uL (ref 4.0–10.5)
nRBC: 0 % (ref 0.0–0.2)

## 2024-01-07 LAB — BASIC METABOLIC PANEL WITH GFR
Anion gap: 10 (ref 5–15)
BUN: 13 mg/dL (ref 8–23)
CO2: 31 mmol/L (ref 22–32)
Calcium: 9 mg/dL (ref 8.9–10.3)
Chloride: 98 mmol/L (ref 98–111)
Creatinine, Ser: 1.01 mg/dL — ABNORMAL HIGH (ref 0.44–1.00)
GFR, Estimated: 55 mL/min — ABNORMAL LOW (ref >60)
Glucose, Bld: 108 mg/dL — ABNORMAL HIGH (ref 70–99)
Potassium: 4 mmol/L (ref 3.5–5.1)
Sodium: 139 mmol/L (ref 135–145)

## 2024-01-07 LAB — MAGNESIUM: Magnesium: 2.1 mg/dL (ref 1.7–2.4)

## 2024-01-07 MED ORDER — SODIUM CHLORIDE 0.9 % IV SOLN: INTRAVENOUS | Status: AC

## 2024-01-07 MED ORDER — CHLORHEXIDINE GLUCONATE 4 % EX SOLN
Freq: Two times a day (BID) | CUTANEOUS | Status: AC
  Administered 2024-01-07 – 2024-01-08 (×2): 1 via TOPICAL
  Filled 2024-01-07 (×2): qty 15

## 2024-01-07 MED ORDER — CEFAZOLIN SODIUM-DEXTROSE 3-4 GM/150ML-% IV SOLN: 3.0000 g | INTRAVENOUS | Status: AC

## 2024-01-07 MED ORDER — SODIUM CHLORIDE 0.9 % IV SOLN: 80.0000 mg | INTRAVENOUS | Status: AC

## 2024-01-07 NOTE — Consult Note (Addendum)
 Cardiology Consultation   Patient ID: Kim Dixon MRN: [id number]; DOB: 1941/02/02  Admit date: 01/02/2024 Date of Consult: 01/07/2024  PCP:  Sheryle Carwin, MD   [facility] Providers Cardiologist:  Jayson Sierras, MD  Cardiology APP:  Johnson Laymon HERO, PA-C  {   Patient Profile: Kim Dixon is a 83 y.o. female with a hx of  HTN, HLD, COPD, OSA Chronic CHF (HFpEF) AFib (found 2020, started amiodarone  May 2025) AFlutter has also been described   who is being seen 01/07/2024 for the evaluation of tachy-brady at the request of Dr. Sonjia.  History of Present Illness: Kim Dixon was seen in the pulmonary office for a visit, found hypotensive and bradycardiac >> ER  Historically: 2021  LHC normal May 2025 found to be in atrial flutter   Started amiodarone .  Taking Eliquis  initially daily, not bid 07/2123  In NSR  Bradycardic   Lopressor  decreased to 25 bid     June 2025  Tarry stools   Hgb 8.24 September 2023  EGD normal Colonoscopy showed  multiple polyps and diverticulosis.   Hemorrhoids   Capsule endoscopy also done  Pt remains OFF of Eliquis   ON low dose amiodarone  (100mg  daily) and lopressor  50mg  BID (Taken day of admission 01/02/24 ~ noon)   She was found in a junctional bradycardia >> cards consulted With stable BP, not in any acute distress > advised to hold home amio/BB follow  Subsequently found w/marked hypothyroid and started on levothyroxine with IM service  EP brief note 01/04/24 > rec continued tx of her thyroid  and monitor if continued brady despite thyroid  correction then would pursue pacing She was seen by Dr. Waddell over the weekend as cardiology coverage >> recurrent AFib w/RVR >> felt to have tachy-brady   LABS K+ 4.0 Mag 2.1 BUN/Creat 13/1.01 (creat on admission 1.81) WBC 5.1 H/H 9.8/33.5 Plts 165  Lactic acid 2 >> 1.4 TSH 78.200 T3 30 fT4 0.35  She reports being at the beach a week or so ago and noticed that she  was more SOB then usual, unable to do activities she would have been able to do No CP No palpitations or cardiac awareness No dizziness, near syncope or syncope  She has no awareness of the Afib Though mentions she still feels unusually SOB, even talking feels winded (more then usual) Reports that she is limited mostly by her COPD at baseline Down to 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 tx cx3 74fu   Squamous cell carcinoma of skin 2023   left forearm sup tx cx3 29fu   Stress incontinence    TIA (transient ischemic attack)    January 2020   Urinary frequency    Varicose veins     Past Surgical History:  Procedure Laterality Date   ABDOMINAL HYSTERECTOMY  ABLATION SAPHENOUS VEIN W/ RFA Bilateral    CATARACT EXTRACTION Bilateral    COLONOSCOPY     COLONOSCOPY N/A 06/18/2013   Procedure: COLONOSCOPY;  Surgeon: Claudis RAYMOND Rivet, MD;  [facility];  Service: Endoscopy;  Laterality: N/A;  830   COLONOSCOPY N/A 10/02/2023   Procedure: COLONOSCOPY;  Surgeon: Eartha Angelia Sieving, MD;  Location: [facility];  Service: Gastroenterology;  Laterality: N/A;  10:30 AM, ASA 1/2   CYSTOSCOPY WITH RETROGRADE PYELOGRAM, URETEROSCOPY AND STENT PLACEMENT Left 10/18/2018   Procedure: CYSTOSCOPY WITH RETROGRADE PYELOGRAM, URETEROSCOPY AND STENT PLACEMENT;  Surgeon: Alvaro Hummer, MD;  Location: [facility];  Service: Urology;  Laterality: Left;  1 HR    ESOPHAGOGASTRODUODENOSCOPY N/A 09/19/2023   Procedure: EGD (ESOPHAGOGASTRODUODENOSCOPY);  Surgeon: Eartha Angelia, Sieving, MD;  Location: [facility];  Service: Gastroenterology;  Laterality: N/A;  730am, asa 1-2   EYE SURGERY     laser surgery bilat    GIVENS CAPSULE STUDY N/A 10/23/2023   Procedure: IMAGING PROCEDURE, GI TRACT, INTRALUMINAL, VIA CAPSULE;  Surgeon: Eartha Angelia, Sieving, MD;  [facility]: [facility];  Service: Gastroenterology;  Laterality: N/A;  730am   HOLMIUM LASER APPLICATION Left 10/18/2018   Procedure: HOLMIUM LASER APPLICATION;  Surgeon: Alvaro Hummer, MD;  Location: [facility];  Service: Urology;  Laterality: Left;   LEFT HEART CATH AND CORONARY ANGIOGRAPHY N/A 11/28/2019   Procedure: LEFT HEART CATH AND CORONARY ANGIOGRAPHY;  Surgeon: Darron Deatrice LABOR, MD;  Location: [facility];  Service: Cardiovascular;  Laterality: N/A;   LITHOTRIPSY     MASS EXCISION N/A 12/07/2017   Procedure: EXCISION 3CM CYST ON BACK;  Surgeon: Mavis Anes, MD;  Location: [facility];  Service: General;  Laterality: N/A;   PARATHYROIDECTOMY Left 04/22/2019   Procedure: LEFT INFERIOR PARATHYROIDECTOMY;  Surgeon: Eletha Boas, MD;  Location: [facility];  Service: General;  Laterality: Left;   TONSILLECTOMY     TOTAL KNEE ARTHROPLASTY Left 08/25/2014   Procedure: LEFT TOTAL KNEE ARTHROPLASTY;  Surgeon: Donnice Car, MD;  Location: [facility];  Service: Orthopedics;  Laterality: Left;     Home Medications:  Prior to Admission medications   Medication Sig Start Date End Date Taking? Authorizing Provider  acetaminophen  (TYLENOL ) 500 MG tablet Take 1,000 mg by mouth every 6 (six) hours as needed for mild pain (pain score 1-3).   Yes [provider]  albuterol  (VENTOLIN  HFA) 108 (90 Base) MCG/ACT inhaler Inhale 2 puffs into the lungs every 6 (six) hours as needed for wheezing or shortness of breath. 05/17/21  Yes Sood, Vineet, MD  amiodarone  (PACERONE ) 200 MG tablet Take 0.5 tablets (100 mg  total) by mouth daily. 10/12/23 10/06/24 Yes Strader, Laymon HERO, PA-C  Ascorbic Acid (VITAMIN C) 1000 MG tablet Take 3,000 mg by mouth daily.    Yes [provider]  atorvastatin (LIPITOR) 10 MG tablet Take 10 mg by mouth every Monday, Wednesday, and Friday at 8 PM.    Yes [provider]  bimatoprost (LUMIGAN) 0.01 % SOLN Place 1 drop into both eyes at bedtime.   Yes [provider]  calcium carbonate (TUMS - DOSED IN MG ELEMENTAL CALCIUM) 500 MG chewable tablet Chew 1 tablet by mouth daily as needed for indigestion.   Yes [provider]  Cyanocobalamin  (VITAMIN B-12 SL) Place 1 tablet under the tongue daily.   Yes [provider]  escitalopram (LEXAPRO) 10 MG tablet Take 10 mg by mouth daily. 02/08/22  Yes [provider]  fluticasone  furoate-vilanterol (  BREO ELLIPTA ) 100-25 MCG/ACT AEPB Inhale 1 puff into the lungs daily. Patient taking differently: Inhale 1 puff into the lungs every evening. 06/05/22  Yes Sood, Vineet, MD  levETIRAcetam (KEPPRA) 500 MG tablet Take 250 mg by mouth 2 (two) times daily. 02/14/21  Yes [provider]  losartan  (COZAAR ) 25 MG tablet Take 1 tablet (25 mg total) by mouth daily. 04/03/23 01/02/24 Yes Strader, Laymon HERO, PA-C  Menthol , Topical Analgesic, (BIOFREEZE ROLL-ON EX) Apply 1 application  topically 2 (two) times daily as needed (back pain).   Yes [provider]  metoprolol  tartrate (LOPRESSOR ) 50 MG tablet Take 50 mg by mouth 2 (two) times daily. 10/17/23  Yes [provider]  omeprazole  (PRILOSEC) 40 MG capsule Take 1 capsule (40 mg total) by mouth 2 (two) times daily. 09/12/23  Yes Eartha Angelia Sieving, MD  potassium chloride  SA (KLOR-CON  M) 20 MEQ tablet TAKE ONE TABLET ( TOTAL) BY MOUTH DAILY Patient taking differently: Take 20 mEq by mouth daily. 12/07/23  Yes Debera Jayson MATSU, MD  timolol (TIMOPTIC) 0.5 % ophthalmic solution Place 1 drop into both eyes daily. 10/23/17   Yes [provider]  torsemide  (DEMADEX ) 20 MG tablet Take 1 tablet (20 mg total) by mouth daily. 10/12/23  Yes Strader, Laymon HERO, PA-C  Vitamin D3 (VITAMIN D) 25 MCG tablet Take 1,000 Units by mouth daily.   Yes [provider]    Scheduled Meds:   ceFAZolin  (ANCEF ) IV  3 g Intravenous On Call   enoxaparin (LOVENOX) injection  40 mg Subcutaneous Q24H   fluticasone  furoate-vilanterol  1 puff Inhalation QPM   gentamicin  (GARAMYCIN ) 80 mg in sodium chloride  0.9 % 500 mL irrigation  80 mg Irrigation On Call   levETIRAcetam  250 mg Oral BID   levothyroxine  75 mcg Intravenous Daily   Continuous Infusions:  sodium chloride      PRN Meds: acetaminophen  **OR** acetaminophen , ondansetron  **OR** ondansetron  (ZOFRAN ) IV, mouth rinse, polyethylene glycol  Allergies:    Allergies  Allergen Reactions   Naproxen Sodium Other (See Comments)    Tightness in throat    Social History:   Social History   Socioeconomic History   Marital status: Widowed    Spouse name: Not on file   Number of children: 3   Years of education: college   Highest education level: Bachelor's degree (e.g., BA, AB, BS)  Occupational History   Occupation: Retired  Tobacco Use   Smoking status: Former    Current packs/day: 0.00    Average packs/day: 3.0 packs/day for 30.0 years (90.0 ttl pk-yrs)    Types: Cigarettes    Start date: 04/05/1963    Quit date: 04/04/1993    Years since quitting: 30.7   Smokeless tobacco: Never  Vaping Use   Vaping status: Never Used  Substance and Sexual Activity   Alcohol use: Yes    Comment: Nightly glass of wine   Drug use: No   Sexual activity: Yes    Birth control/protection: Surgical  Other Topics Concern   Not on file  Social History Narrative   Lives alone.   Right-handed.   One cup coffee per day.  Occasional soda.   Social Drivers of Corporate investment banker Strain: Not on file  Food Insecurity: No Food Insecurity (01/02/2024)   Hunger  Vital Sign    Worried About Running Out of Food in the Last Year: Never true    Ran Out of Food in the Last Year: Never true  Transportation Needs: No Transportation Needs (01/02/2024)   PRAPARE - Administrator, Civil Service (Medical): No    Lack of Transportation (Non-Medical): No  Physical Activity: Not on file  Stress: Not on file  Social Connections: Moderately Integrated (01/02/2024)   Social Connection and Isolation Panel    Frequency of Communication with Friends and Family: More than three times a week    Frequency of Social Gatherings with Friends and Family: More than three times a week    Attends Religious Services: More than 4 times per year    Active Member of [employer] or Organizations: Yes    Attends Banker Meetings: More than 4 times per year    Marital Status: Widowed  Intimate Partner Violence: Not At Risk (01/02/2024)   Humiliation, Afraid, Rape, and Kick questionnaire    Fear of Current or Ex-Partner: No    Emotionally Abused: No    Physically Abused: No    Sexually Abused: No    Family History:   Family History  Problem Relation Age of Onset   Coronary artery disease Mother    Lung cancer Father    Colon cancer Neg Hx    Stomach cancer Neg Hx      ROS:  Please see the history of present illness.  All other ROS reviewed and negative.     Physical Exam/Data: Vitals:   01/07/24 0432 01/07/24 0447 01/07/24 0500 01/07/24 0806  BP: 101/64     Pulse: (!) 117 (!) 117 (!) 109   Resp: 20     Temp: 98.7 F (37.1 C)     TempSrc: Oral     SpO2: 92% 90% 93% 93%  Weight:      Height:        Intake/Output Summary (Last 24 hours) at 01/07/2024 0828 Last data filed at 01/06/2024 2000 Gross per 24 hour  Intake 350 ml  Output --  Net 350 ml      01/02/2024    3:32 PM 01/02/2024    2:35 PM 10/23/2023    7:16 AM  Last 3 Weights  Weight (lbs) 264 lb 15.9 oz 265 lb 265 lb  Weight (kg) 120.2 kg 120.203 kg 120.203 kg     Body mass  index is 46.94 kg/m.  General:  morbidly obese, in no acute distress HEENT: normal Neck: no JVD Vascular: No carotid bruits; Distal pulses 2+ bilaterally Cardiac:  irreg-irreg; no murmurs, gallops or rubs Lungs:  CTA b/l, no wheezing, rhonchi or rales  Abd: soft, nontender Ext: no edema Musculoskeletal:  No deformities, BUE and BLE strength normal and equal Skin: warm and dry  Neuro:  no gross focal abnormalities noted Psych:  Normal affect   EKG:  The EKG was personally reviewed and demonstrates:    Junctional bradycardia 29bpm, narrow QRS  99ms (baseline morphology)  Old: SB 48bpm  Telemetry:  Telemetry was personally reviewed and demonstrates:    AFib 110's-110s  Relevant CV Studies:  01/03/24: TTE 1. Left ventricular ejection fraction, by estimation, is 60 to 65%. The  left ventricle has normal function. The left ventricle has no regional  wall motion abnormalities. There is mild concentric left ventricular  hypertrophy. Left ventricular diastolic  parameters are consistent with Grade III diastolic dysfunction  (restrictive). Elevated left atrial pressure.   2. Right ventricular systolic function is normal. The right ventricular  size is normal.   3. Left atrial size was mildly dilated.   4. Right atrial size was mildly  dilated.   5. The mitral valve is normal in structure. Mild mitral valve  regurgitation. No evidence of mitral stenosis.   6. The aortic valve is normal in structure. Aortic valve regurgitation is  not visualized. No aortic stenosis is present.   7. The inferior vena cava is normal in size with greater than 50%  respiratory variability, suggesting right atrial pressure of 3 mmHg.     Laboratory Data: High Sensitivity Troponin:  No results for input(s): TROPONINIHS in the last 720 hours.   Chemistry Recent Labs  Lab 01/05/24 0450 01/05/24 1219 01/06/24 0448 01/07/24 0423  NA  --  142 143 139  K  --  4.1 4.0 4.0  CL  --  102 101 98  CO2   --  28 33* 31  GLUCOSE  --  175* 114* 108*  BUN  --  14 9 13   CREATININE  --  1.25* 1.10* 1.01*  CALCIUM  --  9.1 9.0 9.0  MG 2.3  --  2.2 2.1  GFRNONAA  --  43* 50* 55*  ANIONGAP  --  12 9 10     Recent Labs  Lab 01/02/24 1557 01/04/24 1556 01/05/24 1219  PROT 7.5 5.4* 6.3*  ALBUMIN 4.3 2.7* 3.3*  AST 19 19 18   ALT 9 9 10   ALKPHOS 57 38 38  BILITOT 0.7 0.7 0.7   Lipids No results for input(s): CHOL, TRIG, HDL, LABVLDL, LDLCALC, CHOLHDL in the last 168 hours.  Hematology Recent Labs  Lab 01/05/24 0450 01/06/24 0448 01/07/24 0423  WBC 4.9 5.4 5.1  RBC 4.18 4.40 4.21  HGB 9.8* 10.3* 9.8*  HCT 33.7* 36.0 33.5*  MCV 80.6 81.8 79.6*  MCH 23.4* 23.4* 23.3*  MCHC 29.1* 28.6* 29.3*  RDW 22.7* 22.6* 22.7*  PLT 163 169 165   Thyroid   Recent Labs  Lab 01/02/24 1557 01/02/24 1822  TSH 78.200*  --   FREET4  --  0.35*    BNP Recent Labs  Lab 01/02/24 1557  PROBNP 552.0*    DDimer No results for input(s): DDIMER in the last 168 hours.  Radiology/Studies:     Assessment and Plan:  Junctional bradycardia In setting of marked hypothyroidism  Paroxysmal Afib CHA2DS2Vasc is 5 Low dose BB resumed with recurrent Afib here  Rates 100's  Not on a/c out patient (or here) Remains anemic with hemoglobin 9's-10. Patient is followed by GI as an outpatient. Planning with push enteroscopy since a small bowel lesion was noted on capsule endoscopy.  We will not be able to pursue rhythm control strategy until felt safe/able to be on anticoagulation again Has been in Afib since 01/05/24 ~ 1200noon In d/w cards APP, in her d/w attending cardiologist last week > given GI w/u in progress > not started on a/c here  3. Tachy-brady With amio/lopressor  out pt, HRs 40's Now with AFib rates 100s or so Will keep NPO for possible PPM EP MD will see   ADDEND: Dr. Kennyth has been bed side Given lab schedule no EP lab availability today Will look to tomorrow NOTE: she  has a lipoma L upper chest Likely will be Dr. Cindie > will have him eval to morrow to decide of implant will need to be R sided or not  For questions or updates, please contact [facility] Please consult [email or web address] for contact info under    Signed, Charlies Macario Arthur, PA-C  01/07/2024 8:28 AM  I have seen, examined the patient, and reviewed the above  assessment and plan.    HPI: Kim Dixon is an 83 year old female with past medical history notable for atrial fibrillation on amiodarone , hypertension, asthma, depression, OSA.  She presented to the ED on 10/15 from her pulmonologist office after being found to have bradycardia and hypotension.  Patient reported 2 months of increasing fatigue and dyspnea on exertion.  She was bradycardic in the ED.  Routine labs were performed and she was noted to have a markedly elevated TSH, consistent with hypothyroidism.  Initial ECGs from the emergency room showed junctional bradycardia.  Amiodarone  and metoprolol  were held.  Her her hospital course has subsequently been complicated by paroxysms of atrial fibrillation with rapid ventricular rates.  Today, she reports feeling better than when her heart rates were low.  She has no new or acute complaints.  General: Well developed, in no acute distress.  Neck: No JVD.  Cardiac: Tachycardic, irregular rhythm.  Resp: Normal work of breathing.  Ext: No edema.  Neuro: No gross focal deficits.  Psych: Normal affect.   ECG 01/02/24: Junctional bradycardia   Echo 01/03/24:  1. Left ventricular ejection fraction, by estimation, is 60 to 65%. The  left ventricle has normal function. The left ventricle has no regional  wall motion abnormalities. There is mild concentric left ventricular  hypertrophy. Left ventricular diastolic  parameters are consistent with Grade III diastolic dysfunction  (restrictive). Elevated left atrial pressure.   2. Right ventricular systolic function is normal. The right  ventricular  size is normal.   3. Left atrial size was mildly dilated.   4. Right atrial size was mildly dilated.   5. The mitral valve is normal in structure. Mild mitral valve  regurgitation. No evidence of mitral stenosis.   6. The aortic valve is normal in structure. Aortic valve regurgitation is  not visualized. No aortic stenosis is present.   7. The inferior vena cava is normal in size with greater than 50%  respiratory variability, suggesting right atrial pressure of 3 mmHg.   Assessment: Kim Dixon is an 83 year old female who presented to the hospital with 2 months of increasing fatigue.  She was also reporting low heart rates and low blood pressures at home.  In the ED, she was found to be bradycardic into the 30s.  TSH was checked and was markedly elevated at 78.2.  Amiodarone  and metoprolol  were held in setting of her bradycardia.  Hospital course has subsequently been complicated by paroxysms of atrial fibrillation with rapid ventricular rates.  Problem List:  Tachycardia-bradycardia syndrome Atrial fibrillation with RVR Sinus bradycardia Hypothyroidism   Plan:  - Patient meets criteria for permanent pacemaker implantation in the setting of tachycardia-bradycardia syndrome.  Explained risks, benefits, and alternatives to pacemaker implantation, including but not limited to bleeding, infection, damage to heart or lungs, heart attack, stroke, or death.  Pt verbalized understanding and agrees to proceed if indicated.  - Following implantation of permanent pacemaker, then we can resume amiodarone  for management of her atrial fibrillation.  This will require thyroid  supplementation.  She is not an ideal candidate for catheter ablation.  In order to transition amiodarone  to Tikosyn, then she would not need a washout period.  If able to treat thyroid  effectively and continue amiodarone , then that would be best course of action. - Hold additional doses of oral anticoagulation in  anticipation of possible pacemaker tomorrow. - Continue with aggressive thyroid  repletion per primary team.  Fonda Kitty, MD 01/07/2024 10:38 PM

## 2024-01-07 NOTE — Progress Notes (Signed)
 Mobility Specialist Progress Note;   01/07/24 0940  Mobility  Activity Ambulated independently;Pivoted/transferred from bed to chair  Level of Assistance Standby assist, set-up cues, supervision of patient - no hands on  Assistive Device None  Distance Ambulated (ft) 200 ft  Activity Response Tolerated fair  Mobility Referral Yes  Mobility visit 1 Mobility  Mobility Specialist Start Time (ACUTE ONLY) 0940  Mobility Specialist Stop Time (ACUTE ONLY) 0950  Mobility Specialist Time Calculation (min) (ACUTE ONLY) 10 min   Pt eager for mobility. Required no physical assistance during ambulation, SV for safety. Ambulated on 2LO2, VSS throughout. HR up to 144 bpm, some SOB displayed. Requested to sit in chair at [facility]. Pt left with all needs met.   Lauraine Erm Mobility Specialist Please contact via SecureChat or [employer] [phone]

## 2024-01-07 NOTE — Progress Notes (Signed)
 Physical Therapy Treatment Patient Details Name: Kim Dixon MRN: [id number] DOB: 06-Oct-1940 Today's Date: 01/07/2024   History of Present Illness Pt is an 83 y/o female admitted 10/15 for low HR and blood pressure. Work up revealed hypothyroid, and possible tachy-brady syndrome, possible pacemaker placement 10/21. PMHx: Glaucoma, OSA, PAF, SCC    PT Comments  The pt was agreeable to session despite fatigue, able to make good progress with ambulation tolerance but continues to benefit from standing rest breaks intermittently. Pt continues to have HR elevation to 160s in afib rhythm with mobility, only sx is mild breathlessness this afternoon. Pt also able to complete 5x sit-stand without assist, but is dependent on use of UE to power up and is far below age-specific norms. Will continue to benefit from skilled PT acutely and progression of activity at home, will re-evaluate as needed after pacemaker placement planned for tomorrow 10/21.   5X Sit-to-Stand: 33 sec (> 14.8 sec indicates increased risk of falls for individuals aged 50-89, > 15 sec indicates increased risk of recurrent falls)     If plan is discharge home, recommend the following:  (PRN assist)   Can travel by private vehicle        Equipment Recommendations  None recommended by PT    Recommendations for Other Services       Precautions / Restrictions Precautions Precautions:  (low fall risk) Recall of Precautions/Restrictions: Intact Precaution/Restrictions Comments: watch HR, afib RVR Restrictions Weight Bearing Restrictions Per Provider Order: No     Mobility  Bed Mobility Overal bed mobility: Needs Assistance Bed Mobility: Supine to Sit, Sit to Supine     Supine to sit: Supervision, HOB elevated Sit to supine: Supervision, HOB elevated, Used rails   General bed mobility comments: supervision with increased time    Transfers Overall transfer level: Needs assistance Equipment used: None Transfers:  Sit to/from Stand Sit to Stand: Supervision           General transfer comment: use of UE to power up, stable    Ambulation/Gait Ambulation/Gait assistance: Supervision Gait Distance (Feet): 100 Feet (+ 30 ft + 50 ft + 80 ft) Assistive device: None Gait Pattern/deviations: Step-through pattern Gait velocity: decreased Gait velocity interpretation: <1.31 ft/sec, indicative of household ambulator   General Gait Details: slight lateral sway but no UE support or LOB. Generally assymptomatic except mild breathlessness, labile HR tachy 120s-160s bpm     Balance Overall balance assessment: Needs assistance Sitting-balance support: No upper extremity supported Sitting balance-Leahy Scale: Good     Standing balance support: No upper extremity supported, During functional activity Standing balance-Leahy Scale: Fair Standing balance comment: poor tolerance of challenge                            Communication Communication Communication: No apparent difficulties  Cognition Arousal: Alert Behavior During Therapy: WFL for tasks assessed/performed   PT - Cognitive impairments: No apparent impairments                       PT - Cognition Comments: WFL in session, not formally assessed Following commands: Intact      Cueing Cueing Techniques: Verbal cues  Exercises Other Exercises Other Exercises: 5x sit-stand: 33 sec with use of UE    General Comments General comments (skin integrity, edema, etc.): HR to 167bpm in afib rhythm, returned to 120s with seated rest      Pertinent Vitals/Pain Pain Assessment  Pain Assessment: No/denies pain Faces Pain Scale: No hurt     PT Goals (current goals can now be found in the care plan section) Acute Rehab PT Goals Patient Stated Goal: home today PT Goal Formulation: With patient Time For Goal Achievement: 01/11/24 Potential to Achieve Goals: Good Progress towards PT goals: Progressing toward goals     Frequency    Min 1X/week       AM-PAC PT 6 Clicks Mobility   Outcome Measure  Help needed turning from your back to your side while in a flat bed without using bedrails?: A Little Help needed moving from lying on your back to sitting on the side of a flat bed without using bedrails?: A Little Help needed moving to and from a bed to a chair (including a wheelchair)?: A Little Help needed standing up from a chair using your arms (e.g., wheelchair or bedside chair)?: A Little Help needed to walk in hospital room?: A Little Help needed climbing 3-5 steps with a railing? : A Little 6 Click Score: 18    End of Session Equipment Utilized During Treatment: Gait belt Activity Tolerance: Patient tolerated treatment well Patient left: with call bell/phone within reach;in bed;with bed alarm set Nurse Communication: Mobility status PT Visit Diagnosis: Other abnormalities of gait and mobility (R26.89);Difficulty in walking, not elsewhere classified (R26.2)     Time: 8553-8487 PT Time Calculation (min) (ACUTE ONLY): 26 min  Charges:    $Gait Training: 8-22 mins $Therapeutic Exercise: 8-22 mins PT General Charges $$ ACUTE PT VISIT: 1 Visit                     Izetta Call, PT, DPT   Acute Rehabilitation Department Office [phone] Secure Chat Communication Preferred   Izetta JULIANNA Call 01/07/2024, 3:33 PM

## 2024-01-07 NOTE — Progress Notes (Signed)
 PROGRESS NOTE  Kim Dixon FMW:[id number] DOB: 1941-03-03 DOA: 01/02/2024 PCP: Sheryle Carwin, MD   LOS: 2 days   Brief narrative:  Kim Dixon is a 83 y.o. female with medical history significant for atrial fibrillation, hypertension, asthma, depression, OSA was sent from pulmonary office with complaints of low heart rate and low blood pressure.  Patient reported that over the last 2 months she had increasing fatigue somnolence with difficulty breathing especially on exertion with chest tightness and near passing out spells.  Patient was on metoprolol  and amiodarone  at home.  She had seen her outpatient provider and amiodarone  and metoprolol  were held.  Patient was then admitted to the hospital for further evaluation and treatment    Assessment/Plan: Principal Problem:   Symptomatic bradycardia Active Problems:   Hypotension   Atrial fibrillation (HCC)   Essential hypertension   OSA (obstructive sleep apnea)   Mild persistent asthma  Symptomatic bradycardia with hypotension History of severe hypothyroidism with episode of pauses on 01/04/2024.  Chest x-ray without any acute abnormality.  Initially received IV fluids.  Has been started on Synthroid.  2D echocardiogram with a EF of 60 to 65%.  Continue to hold metoprolol  due to pauses and bradycardia. Likely need pacemaker placement      AKI Initial creatinine of 1.3.  Torsemide  and losartan  on hold.  Latest creatinine of 1.0.   Hypothyroidism- With symptomatic bradycardia, fatigue weakness.  TSH markedly elevated at 78.2 with a low T3 and free T4.  Started on 88 mcg daily but electrophysiology cardiology advised for IV so has been on  75 mcg since at 01/05/2024.  Will continue to hold amiodarone .   Atrial fibrillation/Tachybradycardia syndrome Presented with marked sinus bradycardia.  Metoprolol  and amiodarone  on hold.  Patient had pauses and cardiology advised to hold metoprolol .  No anticoagulation due to history of GI  bleed.  Heart rate has improved at this time.  Patient is not a candidate for atrial fibrillation ablation due to age and obesity.  Plan for pacemaker placement.  Currently on IV fluids.   Hypertension Antihypertensives including losartan , metoprolol , torsemide  on hold initially due to low blood pressure.  Blood pressure is starting to improve.   Diastolic CHF Compensated.  Review of last 2D echocardiogram from 721 with a EF of 60 to 65%.  Torsemide  on hold at this time   Seizure disorder Continue Keppra  Class III obesity.Body mass index is 46.94 kg/m.  Patient would benefit from weight loss as outpatient.  Obstructive sleep apnea.  Patient uses CPAP at nighttime.  Will resume while in the hospital.  DVT prophylaxis: enoxaparin (LOVENOX) injection 40 mg Start: 01/02/24 2230   Disposition: Likely home with home health in 1 to 2 days  Status is: Inpatient Remains inpatient appropriate because: Pending clinical improvement, need for pacemaker placement    Code Status:     Code Status: Full Code  Family Communication: None at bedside  Consultants: Cardiology Electrophysiology cardiology  Procedures: None yet  Anti-infectives:  None  Anti-infectives (From admission, onward)    Start     Dose/Rate Route Frequency Ordered Stop   01/07/24 0845  gentamicin  (GARAMYCIN ) 80 mg in sodium chloride  0.9 % 500 mL irrigation        80 mg Irrigation On call 01/07/24 0749 01/08/24 0845   01/07/24 0845  ceFAZolin  (ANCEF ) IVPB 3g/150 mL premix        3 g 300 mL/hr over 30 Minutes Intravenous On call 01/07/24 0749 01/08/24 0845  Subjective: Today, patient was seen and examined at bedside.  Patient concerned about pacemaker.  Denies any chest pain, fever, chills or rigor.    Objective: Vitals:   01/07/24 0806 01/07/24 0844  BP:  (!) 135/97  Pulse:    Resp:  19  Temp:  97.8 F (36.6 C)  SpO2: 93%     Intake/Output Summary (Last 24 hours) at 01/07/2024 1149 Last data  filed at 01/06/2024 2000 Gross per 24 hour  Intake 350 ml  Output --  Net 350 ml   Filed Weights   01/02/24 1532  Weight: 120.2 kg   Body mass index is 46.94 kg/m.   Physical Exam: GENERAL: Patient is alert awake and oriented.  elderly female, obese built, on nasal cannula oxygen HENT: No scleral pallor or icterus. Pupils equally reactive to light. Oral mucosa is moist NECK: is supple, no gross swelling noted. CHEST: Diminished breath sounds bilaterally. CVS: S1 and S2 heard  Irregularly irregular rhythm  ABDOMEN: Soft, non-tender, bowel sounds are present. EXTREMITIES: Trace bilateral lower extremity edema CNS: Cranial nerves are intact. No focal motor deficits. SKIN: warm and dry without rashes.  Data Review: I have personally reviewed the following laboratory data and studies,  CBC: Recent Labs  Lab 01/02/24 1557 01/03/24 0452 01/05/24 0450 01/06/24 0448 01/07/24 0423  WBC 4.8 4.2 4.9 5.4 5.1  NEUTROABS 2.9  --   --   --   --   HGB 10.6* 9.3* 9.8* 10.3* 9.8*  HCT 36.6 31.8* 33.7* 36.0 33.5*  MCV 81.7 80.7 80.6 81.8 79.6*  PLT 183 155 163 169 165   Basic Metabolic Panel: Recent Labs  Lab 01/02/24 1557 01/03/24 0452 01/04/24 0419 01/04/24 1556 01/05/24 0450 01/05/24 1219 01/06/24 0448 01/07/24 0423  NA 140   < > 142 141  --  142 143 139  K 4.3   < > 3.8 4.0  --  4.1 4.0 4.0  CL 100   < > 103 100  --  102 101 98  CO2 27   < > 31 33*  --  28 33* 31  GLUCOSE 121*   < > 101* 149*  --  175* 114* 108*  BUN 19   < > 14 13  --  14 9 13   CREATININE 1.81*   < > 1.37* 1.29*  --  1.25* 1.10* 1.01*  CALCIUM 8.7*   < > 8.7* 8.7*  --  9.1 9.0 9.0  MG 2.6*  --   --   --  2.3  --  2.2 2.1   < > = values in this interval not displayed.   Liver Function Tests: Recent Labs  Lab 01/02/24 1557 01/04/24 1556 01/05/24 1219  AST 19 19 18   ALT 9 9 10   ALKPHOS 57 38 38  BILITOT 0.7 0.7 0.7  PROT 7.5 5.4* 6.3*  ALBUMIN 4.3 2.7* 3.3*   No results for input(s):  LIPASE, AMYLASE in the last 168 hours. No results for input(s): AMMONIA in the last 168 hours. Cardiac Enzymes: No results for input(s): CKTOTAL, CKMB, CKMBINDEX, TROPONINI in the last 168 hours. BNP (last 3 results) Recent Labs    08/27/23 0859  BNP 430.0*    ProBNP (last 3 results) Recent Labs    01/02/24 1557  PROBNP 552.0*    CBG: Recent Labs  Lab 01/02/24 1635  GLUCAP 120*   Recent Results (from the past 240 hours)  Surgical PCR screen     Status: None   Collection Time:  01/06/24  2:26 PM   Specimen: Nasal Mucosa; Nasal Swab  Result Value Ref Range Status   MRSA, PCR NEGATIVE NEGATIVE Final   Staphylococcus aureus NEGATIVE NEGATIVE Final    Comment: (NOTE) The Xpert SA Assay (FDA approved for NASAL specimens in patients 57 years of age and older), is one component of a comprehensive surveillance program. It is not intended to diagnose infection nor to guide or monitor treatment. Performed at [facility], [address], [place], [place] [address]      Studies: No results found.    Janisa Labus, MD  [facility] 01/07/2024  If 7PM-7AM, please contact night-coverage

## 2024-01-08 ENCOUNTER — Inpatient Hospital Stay (HOSPITAL_COMMUNITY)

## 2024-01-08 ENCOUNTER — Encounter (HOSPITAL_COMMUNITY)
Admission: EM | Disposition: A | Discharge status: Home Health | Payer: Self-pay | Source: Home / Self Care | Attending: Internal Medicine

## 2024-01-08 DIAGNOSIS — I495 Sick sinus syndrome: Secondary | ICD-10-CM | POA: Diagnosis not present

## 2024-01-08 DIAGNOSIS — R0902 Hypoxemia: Secondary | ICD-10-CM | POA: Diagnosis not present

## 2024-01-08 DIAGNOSIS — R001 Bradycardia, unspecified: Secondary | ICD-10-CM | POA: Diagnosis not present

## 2024-01-08 DIAGNOSIS — R0989 Other specified symptoms and signs involving the circulatory and respiratory systems: Secondary | ICD-10-CM | POA: Diagnosis not present

## 2024-01-08 LAB — BASIC METABOLIC PANEL WITH GFR
Anion gap: 9 (ref 5–15)
BUN: 14 mg/dL (ref 8–23)
CO2: 28 mmol/L (ref 22–32)
Calcium: 9.2 mg/dL (ref 8.9–10.3)
Chloride: 103 mmol/L (ref 98–111)
Creatinine, Ser: 1.08 mg/dL — ABNORMAL HIGH (ref 0.44–1.00)
GFR, Estimated: 51 mL/min — ABNORMAL LOW (ref >60)
Glucose, Bld: 120 mg/dL — ABNORMAL HIGH (ref 70–99)
Potassium: 4.8 mmol/L (ref 3.5–5.1)
Sodium: 140 mmol/L (ref 135–145)

## 2024-01-08 LAB — CBC
HCT: 36.5 % (ref 36.0–46.0)
Hemoglobin: 10.5 g/dL — ABNORMAL LOW (ref 12.0–15.0)
MCH: 23.2 pg — ABNORMAL LOW (ref 26.0–34.0)
MCHC: 28.8 g/dL — ABNORMAL LOW (ref 30.0–36.0)
MCV: 80.8 fL (ref 80.0–100.0)
Platelets: 143 K/uL — ABNORMAL LOW (ref 150–400)
RBC: 4.52 MIL/uL (ref 3.87–5.11)
RDW: 23.2 % — ABNORMAL HIGH (ref 11.5–15.5)
WBC: 4.8 K/uL (ref 4.0–10.5)
nRBC: 0 % (ref 0.0–0.2)

## 2024-01-08 LAB — MAGNESIUM: Magnesium: 2.2 mg/dL (ref 1.7–2.4)

## 2024-01-08 LAB — PHOSPHORUS: Phosphorus: 5 mg/dL — ABNORMAL HIGH (ref 2.5–4.6)

## 2024-01-08 SURGERY — PACEMAKER IMPLANT

## 2024-01-08 MED ORDER — SODIUM CHLORIDE 0.9% FLUSH
3.0000 mL | Freq: Two times a day (BID) | INTRAVENOUS | Status: DC
  Administered 2024-01-08 – 2024-01-10 (×6): 3 mL via INTRAVENOUS

## 2024-01-08 MED ORDER — FLUTICASONE PROPIONATE 50 MCG/ACT NA SUSP
2.0000 | Freq: Every day | NASAL | Status: DC
  Administered 2024-01-08 – 2024-01-11 (×4): 2 via NASAL
  Filled 2024-01-08: qty 16

## 2024-01-08 MED ORDER — FUROSEMIDE 10 MG/ML IJ SOLN
40.0000 mg | Freq: Once | INTRAMUSCULAR | Status: AC
  Administered 2024-01-08: 40 mg via INTRAVENOUS
  Filled 2024-01-08: qty 4

## 2024-01-08 MED ORDER — SODIUM CHLORIDE 0.9 % IV SOLN
INTRAVENOUS | Status: AC
  Filled 2024-01-08: qty 2

## 2024-01-08 MED ORDER — CEFAZOLIN SODIUM-DEXTROSE 3-4 GM/150ML-% IV SOLN
3.0000 g | INTRAVENOUS | Status: AC
  Filled 2024-01-08: qty 150

## 2024-01-08 MED ORDER — LIDOCAINE HCL (PF) 1 % IJ SOLN
INTRAMUSCULAR | Status: AC
  Filled 2024-01-08: qty 60

## 2024-01-08 NOTE — Progress Notes (Signed)
 PROGRESS NOTE  DANENE MONTIJO FMW:[id number] DOB: Jul 31, 1940 DOA: 01/02/2024 PCP: Sheryle Carwin, MD   LOS: 3 days   Brief narrative:  Kim Dixon is a 83 y.o. female with medical history significant for atrial fibrillation, hypertension, asthma, depression, OSA was sent from pulmonary office with complaints of low heart rate and low blood pressure.  Patient reported that over the last 2 months she had increasing fatigue, somnolence with difficulty breathing especially on exertion with chest tightness and near passing out spells.  Patient was on metoprolol  and amiodarone  at home.  She had seen her outpatient provider and amiodarone  and metoprolol  were held.  Patient was then admitted to the hospital for further evaluation and treatment. At this time, patient has been seen by electrophysiology cardiology and plans for permanent pacemaker placement.    Assessment/Plan: Principal Problem:   Symptomatic bradycardia Active Problems:   Hypotension   Atrial fibrillation (HCC)   Essential hypertension   OSA (obstructive sleep apnea)   Mild persistent asthma  Symptomatic bradycardia with hypotension History of severe hypothyroidism with episode of pauses on 01/04/2024.  Chest x-ray without any acute abnormality.  Initially received IV fluids.  Has been started on Synthroid.  2D echocardiogram with a EF of 60 to 65%.  Continue to hold metoprolol  due to pauses and bradycardia.  Plan for permanent pacemaker placement today.     AKI Initial creatinine of 1.3.  Torsemide  and losartan  on hold.  Latest creatinine of 1.0.   Hypothyroidism- With symptomatic bradycardia, fatigue weakness.  TSH markedly elevated at 78.2 with a low T3 and free T4.  Started on 88 mcg daily but electrophysiology cardiology advised for IV so has been on  75 mcg since at 01/05/2024.  Will continue to hold amiodarone .  Will need Synthroid on discharge.   Atrial fibrillation/Tachybradycardia syndrome Presented with  marked sinus bradycardia.  Metoprolol  and amiodarone  on hold.  Patient had pauses and cardiology advised to hold metoprolol .  No anticoagulation due to history of GI bleed.  Heart rate has improved at this time.  Patient is not a candidate for atrial fibrillation ablation due to age and obesity.  Plan for pacemaker placement.    Hypertension Antihypertensives including losartan , metoprolol , torsemide  on hold initially due to low blood pressure.  Blood pressure is starting to improve.     Diastolic CHF Compensated.  Review of last 2D echocardiogram from 721 with a EF of 60 to 65%.  Torsemide  on hold at this time   Seizure disorder Continue Keppra  Class III obesity.Body mass index is 46.94 kg/m.  Patient would benefit from weight loss as outpatient.  Obstructive sleep apnea.  Patient uses CPAP at nighttime.  Will resume while in the hospital.  DVT prophylaxis: Place and maintain sequential compression device Start: 01/08/24 0706   Disposition: Likely home with home health in 1 to 2 days  Status is: Inpatient Remains inpatient appropriate because: Pending clinical improvement, need for pacemaker placement    Code Status:     Code Status: Full Code  Family Communication: None at bedside  Consultants: Cardiology Electrophysiology cardiology  Procedures: None yet  Anti-infectives:  None  Anti-infectives (From admission, onward)    Start     Dose/Rate Route Frequency Ordered Stop   01/08/24 1030  ceFAZolin  (ANCEF ) IVPB 3g/150 mL premix        3 g 300 mL/hr over 30 Minutes Intravenous On call 01/08/24 0942 01/09/24 1030   01/08/24 0937  sodium chloride  0.9 % with gentamicin  (GARAMYCIN )  ADS Med  Status:  Discontinued       Note to Pharmacy: Wilkie Pa G: cabinet override      01/08/24 9062 01/08/24 0947   01/07/24 0845  gentamicin  (GARAMYCIN ) 80 mg in sodium chloride  0.9 % 500 mL irrigation        80 mg Irrigation On call 01/07/24 0749 01/08/24 0845   01/07/24 0845   ceFAZolin  (ANCEF ) IVPB 3g/150 mL premix        3 g 300 mL/hr over 30 Minutes Intravenous On call 01/07/24 0749 01/08/24 0845        Subjective: Today, patient was seen and examined at bedside.  Patient denies any pain, nausea, vomiting, fever, chills or rigor.  Has some stuffy nose and scratchy throat.  Objective: Vitals:   01/08/24 0840 01/08/24 1117  BP: 124/69 (!) 143/70  Pulse: 100   Resp: 18 17  Temp: 97.9 F (36.6 C) 98.1 F (36.7 C)  SpO2: 92%    No intake or output data in the 24 hours ending 01/08/24 1140  Filed Weights   01/02/24 1532  Weight: 120.2 kg   Body mass index is 46.94 kg/m.   Physical Exam:  GENERAL: Patient is alert awake and oriented.  elderly female, obese built, on nasal cannula oxygen HENT: No scleral pallor or icterus. Pupils equally reactive to light. Oral mucosa is moist NECK: is supple, no gross swelling noted. CHEST: Diminished breath sounds bilaterally.  No wheezes noted. CVS: S1 and S2 heard  Irregularly irregular rhythm  ABDOMEN: Soft, non-tender, bowel sounds are present. EXTREMITIES: Trace bilateral lower extremity edema present. CNS: Cranial nerves are intact. No focal motor deficits. SKIN: warm and dry without rashes.  Data Review: I have personally reviewed the following laboratory data and studies,  CBC: Recent Labs  Lab 01/02/24 1557 01/03/24 0452 01/05/24 0450 01/06/24 0448 01/07/24 0423 01/08/24 0356  WBC 4.8 4.2 4.9 5.4 5.1 4.8  NEUTROABS 2.9  --   --   --   --   --   HGB 10.6* 9.3* 9.8* 10.3* 9.8* 10.5*  HCT 36.6 31.8* 33.7* 36.0 33.5* 36.5  MCV 81.7 80.7 80.6 81.8 79.6* 80.8  PLT 183 155 163 169 165 143*   Basic Metabolic Panel: Recent Labs  Lab 01/02/24 1557 01/03/24 0452 01/04/24 1556 01/05/24 0450 01/05/24 1219 01/06/24 0448 01/07/24 0423 01/08/24 0356  NA 140   < > 141  --  142 143 139 140  K 4.3   < > 4.0  --  4.1 4.0 4.0 4.8  CL 100   < > 100  --  102 101 98 103  CO2 27   < > 33*  --  28 33*  31 28  GLUCOSE 121*   < > 149*  --  175* 114* 108* 120*  BUN 19   < > 13  --  14 9 13 14   CREATININE 1.81*   < > 1.29*  --  1.25* 1.10* 1.01* 1.08*  CALCIUM 8.7*   < > 8.7*  --  9.1 9.0 9.0 9.2  MG 2.6*  --   --  2.3  --  2.2 2.1 2.2  PHOS  --   --   --   --   --   --   --  5.0*   < > = values in this interval not displayed.   Liver Function Tests: Recent Labs  Lab 01/02/24 1557 01/04/24 1556 01/05/24 1219  AST 19 19 18   ALT 9 9  10  ALKPHOS 57 38 38  BILITOT 0.7 0.7 0.7  PROT 7.5 5.4* 6.3*  ALBUMIN 4.3 2.7* 3.3*   No results for input(s): LIPASE, AMYLASE in the last 168 hours. No results for input(s): AMMONIA in the last 168 hours. Cardiac Enzymes: No results for input(s): CKTOTAL, CKMB, CKMBINDEX, TROPONINI in the last 168 hours. BNP (last 3 results) Recent Labs    08/27/23 0859  BNP 430.0*    ProBNP (last 3 results) Recent Labs    01/02/24 1557  PROBNP 552.0*    CBG: Recent Labs  Lab 01/02/24 1635  GLUCAP 120*   Recent Results (from the past 240 hours)  Surgical PCR screen     Status: None   Collection Time: 01/06/24  2:26 PM   Specimen: Nasal Mucosa; Nasal Swab  Result Value Ref Range Status   MRSA, PCR NEGATIVE NEGATIVE Final   Staphylococcus aureus NEGATIVE NEGATIVE Final    Comment: (NOTE) The Xpert SA Assay (FDA approved for NASAL specimens in patients 17 years of age and older), is one component of a comprehensive surveillance program. It is not intended to diagnose infection nor to guide or monitor treatment. Performed at [facility], [address] [place], [place] [address]      Studies: No results found.    Vernal Alstrom, MD  [facility] 01/08/2024  If 7PM-7AM, please contact night-coverage

## 2024-01-08 NOTE — Progress Notes (Signed)
 Mobility Specialist Progress Note;   01/08/24 0942  Mobility  Activity Ambulated with assistance;Pivoted/transferred from bed to chair  Level of Assistance Standby assist, set-up cues, supervision of patient - no hands on  Assistive Device None  Distance Ambulated (ft) 200 ft  Activity Response Tolerated well  Mobility Referral Yes  Mobility visit 1 Mobility  Mobility Specialist Start Time (ACUTE ONLY) [id number]  Mobility Specialist Stop Time (ACUTE ONLY) 0957  Mobility Specialist Time Calculation (min) (ACUTE ONLY) 15 min   Pt eager for mobility. Required no physical assistance during ambulation, SV for safety. Ambulated on 2LO2, VSS throughout. HR up to 147 bpm w/ exertion. Requested to sit in chair at [facility]. Pt left with all needs met, call bell in reach.   Lauraine Erm Mobility Specialist Please contact via SecureChat or [employer] [phone]

## 2024-01-08 NOTE — Plan of Care (Signed)

## 2024-01-08 NOTE — Progress Notes (Addendum)
 Rounding Note   Patient Name: Kim Dixon Date of Encounter: 01/08/2024  [facility] Cardiologist: Jayson Sierras, MD   Subjective  Feels fine, dry mouth. No CP, palpitations or cardiac awareness  Scheduled Meds:   ceFAZolin  (ANCEF ) IV  3 g Intravenous On Call   fluticasone  furoate-vilanterol  1 puff Inhalation QPM   gentamicin  (GARAMYCIN ) 80 mg in sodium chloride  0.9 % 500 mL irrigation  80 mg Irrigation On Call   levETIRAcetam  250 mg Oral BID   levothyroxine  75 mcg Intravenous Daily   Continuous Infusions:  sodium chloride  50 mL/hr at 01/08/24 0554   PRN Meds: acetaminophen  **OR** acetaminophen , ondansetron  **OR** ondansetron  (ZOFRAN ) IV, mouth rinse, polyethylene glycol   Vital Signs  Vitals:   01/07/24 2006 01/08/24 0008 01/08/24 0419 01/08/24 0420  BP: (!) 128/94 127/71 (!) 141/86 (!) 141/86  Pulse: 100 75 (!) 102 71  Resp: 18 18 18    Temp: 98 F (36.7 C) 98 F (36.7 C) 98.1 F (36.7 C)   TempSrc: Oral Oral Oral   SpO2: 92% 91% 92% 95%  Weight:      Height:       No intake or output data in the 24 hours ending 01/08/24 0820    01/02/2024    3:32 PM 01/02/2024    2:35 PM 10/23/2023    7:16 AM  Last 3 Weights  Weight (lbs) 264 lb 15.9 oz 265 lb 265 lb  Weight (kg) 120.2 kg 120.203 kg 120.203 kg      Telemetry  AFib 100's - Personally Reviewed  ECG   No new EKGs  - Personally Reviewed  Physical Exam  GEN: No acute distress.   Neck: No JVD Cardiac: irreg-irreg, no murmurs, rubs, or gallops.  Respiratory: soft crackles in the bases b/l GI: Soft, nontender, non-distended obese MS: No edema; No deformity. Neuro:  Nonfocal  Psych: Normal affect   Labs High Sensitivity Troponin:  No results for input(s): TROPONINIHS in the last 720 hours.   Chemistry Recent Labs  Lab 01/02/24 1557 01/03/24 0452 01/04/24 1556 01/05/24 0450 01/05/24 1219 01/06/24 0448 01/07/24 0423 01/08/24 0356  NA 140   < > 141  --  142 143 139 140   K 4.3   < > 4.0  --  4.1 4.0 4.0 4.8  CL 100   < > 100  --  102 101 98 103  CO2 27   < > 33*  --  28 33* 31 28  GLUCOSE 121*   < > 149*  --  175* 114* 108* 120*  BUN 19   < > 13  --  14 9 13 14   CREATININE 1.81*   < > 1.29*  --  1.25* 1.10* 1.01* 1.08*  CALCIUM 8.7*   < > 8.7*  --  9.1 9.0 9.0 9.2  MG 2.6*  --   --    < >  --  2.2 2.1 2.2  PROT 7.5  --  5.4*  --  6.3*  --   --   --   ALBUMIN 4.3  --  2.7*  --  3.3*  --   --   --   AST 19  --  19  --  18  --   --   --   ALT 9  --  9  --  10  --   --   --   ALKPHOS 57  --  38  --  38  --   --   --  BILITOT 0.7  --  0.7  --  0.7  --   --   --   GFRNONAA 27*   < > 41*  --  43* 50* 55* 51*  ANIONGAP 13   < > 8  --  12 9 10 9    < > = values in this interval not displayed.    Lipids No results for input(s): CHOL, TRIG, HDL, LABVLDL, LDLCALC, CHOLHDL in the last 168 hours.  Hematology Recent Labs  Lab 01/06/24 0448 01/07/24 0423 01/08/24 0356  WBC 5.4 5.1 4.8  RBC 4.40 4.21 4.52  HGB 10.3* 9.8* 10.5*  HCT 36.0 33.5* 36.5  MCV 81.8 79.6* 80.8  MCH 23.4* 23.3* 23.2*  MCHC 28.6* 29.3* 28.8*  RDW 22.6* 22.7* 23.2*  PLT 169 165 143*   Thyroid   Recent Labs  Lab 01/02/24 1557 01/02/24 1822  TSH 78.200*  --   FREET4  --  0.35*    BNP Recent Labs  Lab 01/02/24 1557  PROBNP 552.0*    DDimer No results for input(s): DDIMER in the last 168 hours.   Radiology  No results found.  Cardiac Studies  01/03/24: TTE 1. Left ventricular ejection fraction, by estimation, is 60 to 65%. The  left ventricle has normal function. The left ventricle has no regional  wall motion abnormalities. There is mild concentric left ventricular  hypertrophy. Left ventricular diastolic  parameters are consistent with Grade III diastolic dysfunction  (restrictive). Elevated left atrial pressure.   2. Right ventricular systolic function is normal. The right ventricular  size is normal.   3. Left atrial size was mildly dilated.   4.  Right atrial size was mildly dilated.   5. The mitral valve is normal in structure. Mild mitral valve  regurgitation. No evidence of mitral stenosis.   6. The aortic valve is normal in structure. Aortic valve regurgitation is  not visualized. No aortic stenosis is present.   7. The inferior vena cava is normal in size with greater than 50%  respiratory variability, suggesting right atrial pressure of 3 mmHg.  Patient Profile   83 y.o. female w/PMHx of  HTN, HLD, COPD, OSA Chronic CHF (HFpEF) AFib (found 2020, started amiodarone  May 2025) AFlutter has also been described  Historically: 2021  LHC normal May 2025 found to be in atrial flutter   Started amiodarone .  Taking Eliquis  initially daily, not bid 07/2123  In NSR  Bradycardic   Lopressor  decreased to 25 bid     June 2025  Tarry stools   Hgb 8.24 September 2023  EGD normal Colonoscopy showed  multiple polyps and diverticulosis.   Hemorrhoids   Capsule endoscopy also done   Pt remains OFF of Eliquis   ON low dose amiodarone  (100mg  daily) and lopressor  50mg  BID (Taken day of admission 01/02/24 ~ noon)     ADMITTED: 01/02/24  with bradycardia/hypotension She was found in a junctional bradycardia >> cards consulted With stable BP, not in any acute distress > advised to hold home amio/BB follow   Subsequently found w/marked hypothyroid and started on levothyroxine with IM service   EP brief note 01/04/24 > rec continued tx of her thyroid  and monitor if continued brady despite thyroid  correction then would pursue pacing She was seen by Dr. 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 Patient is followed by GI as an outpatient. Planning with push enteroscopy since a small  bowel lesion was noted on capsule endoscopy.  We will not be able to pursue rhythm control strategy until felt safe/able to be on anticoagulation again Has been in Afib since 01/05/24 ~ 1200noon In d/w cards APP, in her d/w attending cardiologist last week > given GI w/u in progress > not started (re-tried) on a/c here     3. Tachy-brady With amio/lopressor  out pt, HRs 40's >> held Now with AFib rates 100s or so Planned tentatively for PPM today, as schedule allows Anticipate dual chamber device RBBB Preserved LVEF   ADDEND: HFpEF Dr. Cindie at bedside Concerns of intermittent > persistent hypoxia requiring O2 (not typically at home) + hx of COPD  Requiring 4L currently to keep sat 90's Takes torsemide  at home > none here. IV lasix  x1 now and CXR  Incentive spirometry  Will postpone PPM until resp status back to baseline    For questions or updates, please contact [facility] Please consult [email or web address] for contact info under     Signed, Charlies Macario Arthur, PA-C  01/08/2024, 8:20 AM

## 2024-01-09 DIAGNOSIS — I959 Hypotension, unspecified: Secondary | ICD-10-CM | POA: Diagnosis not present

## 2024-01-09 DIAGNOSIS — I10 Essential (primary) hypertension: Secondary | ICD-10-CM | POA: Diagnosis not present

## 2024-01-09 DIAGNOSIS — I4891 Unspecified atrial fibrillation: Secondary | ICD-10-CM | POA: Diagnosis not present

## 2024-01-09 DIAGNOSIS — I495 Sick sinus syndrome: Secondary | ICD-10-CM | POA: Diagnosis not present

## 2024-01-09 DIAGNOSIS — R001 Bradycardia, unspecified: Secondary | ICD-10-CM | POA: Diagnosis not present

## 2024-01-09 LAB — BASIC METABOLIC PANEL WITH GFR
Anion gap: 10 (ref 5–15)
BUN: 16 mg/dL (ref 8–23)
CO2: 30 mmol/L (ref 22–32)
Calcium: 9.4 mg/dL (ref 8.9–10.3)
Chloride: 101 mmol/L (ref 98–111)
Creatinine, Ser: 1.09 mg/dL — ABNORMAL HIGH (ref 0.44–1.00)
GFR, Estimated: 50 mL/min — ABNORMAL LOW (ref >60)
Glucose, Bld: 106 mg/dL — ABNORMAL HIGH (ref 70–99)
Potassium: 3.9 mmol/L (ref 3.5–5.1)
Sodium: 141 mmol/L (ref 135–145)

## 2024-01-09 LAB — CBC
HCT: 31.9 % — ABNORMAL LOW (ref 36.0–46.0)
Hemoglobin: 9.4 g/dL — ABNORMAL LOW (ref 12.0–15.0)
MCH: 23.5 pg — ABNORMAL LOW (ref 26.0–34.0)
MCHC: 29.5 g/dL — ABNORMAL LOW (ref 30.0–36.0)
MCV: 79.8 fL — ABNORMAL LOW (ref 80.0–100.0)
Platelets: 168 K/uL (ref 150–400)
RBC: 4 MIL/uL (ref 3.87–5.11)
RDW: 23.2 % — ABNORMAL HIGH (ref 11.5–15.5)
WBC: 4.5 K/uL (ref 4.0–10.5)
nRBC: 0 % (ref 0.0–0.2)

## 2024-01-09 LAB — MAGNESIUM: Magnesium: 2.1 mg/dL (ref 1.7–2.4)

## 2024-01-09 MED ORDER — SODIUM CHLORIDE 0.9% FLUSH
3.0000 mL | Freq: Two times a day (BID) | INTRAVENOUS | Status: DC
  Administered 2024-01-09 – 2024-01-11 (×3): 3 mL via INTRAVENOUS

## 2024-01-09 MED ORDER — TORSEMIDE 20 MG PO TABS
20.0000 mg | ORAL_TABLET | Freq: Every day | ORAL | Status: DC
  Administered 2024-01-09 – 2024-01-11 (×3): 20 mg via ORAL
  Filled 2024-01-09 (×3): qty 1

## 2024-01-09 MED ORDER — ALBUTEROL SULFATE (2.5 MG/3ML) 0.083% IN NEBU: 3.0000 mL | INHALATION_SOLUTION | Freq: Four times a day (QID) | RESPIRATORY_TRACT | Status: DC | PRN

## 2024-01-09 NOTE — Progress Notes (Signed)
 [facility]  PROGRESS NOTE  Kim Dixon FMW:[id number] DOB: 1940-05-22 DOA: 01/02/2024 PCP: Sheryle Carwin, MD   Brief HPI:   83 y.o. female with medical history significant for atrial fibrillation, hypertension, asthma, depression, OSA was sent from pulmonary office with complaints of low heart rate and low blood pressure.  Patient reported that over the last 2 months she had increasing fatigue, somnolence with difficulty breathing especially on exertion with chest tightness and near passing out spells.  Patient was on metoprolol  and amiodarone  at home.  She had seen her outpatient provider and amiodarone  and metoprolol  were held.  Patient was then admitted to the hospital for further evaluation and treatment. At this time, patient has been seen by electrophysiology cardiology and plans for permanent pacemaker placement.       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 - History of severe hypothyroidism with episodes of sinus pauses on 01/04/2024 - Has been started on Synthroid due to elevated TSH - Metoprolol  on hold due to sinus pauses with bradycardia - Cardiology following, plan for permanent pacemaker  Hypothyroidism - TSH markedly elevated at 78.2 with low T3 and free T4 - Started on IV Synthroid 75 mcg daily - Continue to hold amiodarone  - Will need Synthroid at discharge  Acute kidney injury - Initial creatinine of 1.3 - Torsemide  and losartan  on hold  Atrial fibrillation - Presented with marked sinus bradycardia - Metoprolol  and amiodarone  on hold - No anticoagulation due to history of GI bleed - Patient is not a candidate for ablation due to age and obesity - Plan for pacemaker placement as above  Hypertension - Antihypertensive including losartan , metoprolol , torsemide  on hold due to low blood pressure - Blood pressure is stable at this time  Seizure disorder - Continue Keppra  Chronic diastolic CHF -  Torsemide  on hold at this time  Obstructive sleep apnea - Uses CPAP at bedtime      DVT prophylaxis: SCDs  Medications     fluticasone   2 spray Each Nare Daily   fluticasone  furoate-vilanterol  1 puff Inhalation QPM   levETIRAcetam  250 mg Oral BID   levothyroxine  75 mcg Intravenous Daily   sodium chloride  flush  3 mL Intravenous Q12H   sodium chloride  flush  3 mL Intravenous Q12H   torsemide   20 mg Oral Daily     Data Reviewed:   CBG:  No results for input(s): GLUCAP in the last 168 hours.  SpO2: 92 % O2 Flow Rate (L/min): 3 L/min    Vitals:   01/09/24 0043 01/09/24 0814 01/09/24 1215 01/09/24 1636  BP: 124/84 108/86 106/88 (!) 115/97  Pulse: 98 94 (!) 103 (!) 114  Resp: 18 20 20    Temp: 98.5 F (36.9 C) 97.6 F (36.4 C) 98 F (36.7 C) 97.7 F (36.5 C)  TempSrc: Oral Oral Oral Oral  SpO2: 93% 99% 94% 92%  Weight:      Height:          Data Reviewed:  Basic Metabolic Panel: Recent Labs  Lab 01/05/24 0450 01/05/24 1219 01/06/24 0448 01/07/24 0423 01/08/24 0356 01/09/24 0437  NA  --  142 143 139 140 141  K  --  4.1 4.0 4.0 4.8 3.9  CL  --  102 101 98 103 101  CO2  --  28 33* 31 28 30   GLUCOSE  --  175* 114* 108* 120* 106*  BUN  --  14 9 13 14 16   CREATININE  --  1.25* 1.10* 1.01* 1.08* 1.09*  CALCIUM  --  9.1 9.0 9.0 9.2 9.4  MG 2.3  --  2.2 2.1 2.2 2.1  PHOS  --   --   --   --  5.0*  --     CBC: Recent Labs  Lab 01/05/24 0450 01/06/24 0448 01/07/24 0423 01/08/24 0356 01/09/24 0437  WBC 4.9 5.4 5.1 4.8 4.5  HGB 9.8* 10.3* 9.8* 10.5* 9.4*  HCT 33.7* 36.0 33.5* 36.5 31.9*  MCV 80.6 81.8 79.6* 80.8 79.8*  PLT 163 169 165 143* 168    LFT Recent Labs  Lab 01/04/24 1556 01/05/24 1219  AST 19 18  ALT 9 10  ALKPHOS 38 38  BILITOT 0.7 0.7  PROT 5.4* 6.3*  ALBUMIN 2.7* 3.3*     Antibiotics: Anti-infectives (From admission, onward)    Start     Dose/Rate Route Frequency Ordered Stop   01/08/24 1030  ceFAZolin  (ANCEF ) IVPB  3g/150 mL premix        3 g 300 mL/hr over 30 Minutes Intravenous On call 01/08/24 0942 01/09/24 1030   01/08/24 0937  sodium chloride  0.9 % with gentamicin  (GARAMYCIN ) ADS Med  Status:  Discontinued       Note to Pharmacy: Wilkie Pa G: cabinet override      01/08/24 9062 01/08/24 0947   01/07/24 0845  gentamicin  (GARAMYCIN ) 80 mg in sodium chloride  0.9 % 500 mL irrigation        80 mg Irrigation On call 01/07/24 0749 01/08/24 0845   01/07/24 0845  ceFAZolin  (ANCEF ) IVPB 3g/150 mL premix        3 g 300 mL/hr over 30 Minutes Intravenous On call 01/07/24 0749 01/08/24 0845        CONSULTS cardiology  Code Status: Full code  Family Communication: Discussed with family at bedside     Subjective   Patient seen and examined, denies any complaints.   Objective    Physical Examination:   General-appears in no acute distress Heart-S1-S2, regular, no murmur auscultated Lungs-clear to auscultation bilaterally, no wheezing or crackles auscultated Abdomen-soft, nontender, no organomegaly Extremities-no edema in the lower extremities Neuro-alert, oriented x3, no focal deficit noted   Status is: Inpatient:             Kim Dixon   [facility] If 7PM-7AM, please contact night-coverage at [email or web address], Office  [phone]   01/09/2024, 4:50 PM  LOS: 4 days

## 2024-01-09 NOTE — Progress Notes (Signed)
Placed patient on CPAP for the night via auto-mode.  

## 2024-01-09 NOTE — Progress Notes (Signed)
 Mobility Specialist Progress Note;   01/09/24 1152  Mobility  Activity Ambulated with assistance;Pivoted/transferred from bed to chair  Level of Assistance Standby assist, set-up cues, supervision of patient - no hands on  Assistive Device None  Distance Ambulated (ft) 250 ft  Activity Response Tolerated well  Mobility Referral Yes  Mobility visit 1 Mobility  Mobility Specialist Start Time (ACUTE ONLY) 1152  Mobility Specialist Stop Time (ACUTE ONLY) 1210  Mobility Specialist Time Calculation (min) (ACUTE ONLY) 18 min   Pt eager for mobility. Required no physical assistance during ambulation, SV for safety. Ambulated on 3LO2, VSS. HR up to 159 bpm w/ exertion, 109 bpm w/ rest. Requested to sit in chair at [facility]. Pt left with all needs met, call bell in reach.   Lauraine Erm Mobility Specialist Please contact via SecureChat or [employer] [phone]

## 2024-01-09 NOTE — Progress Notes (Signed)
 Rounding Note   Patient Name: Kim Dixon Date of Encounter: 01/09/2024  [facility] Cardiologist: Jayson Sierras, MD   Subjective  Feels fine, dry mouth, dry cough, denies SOB No CP, palpitations or cardiac awareness Was able to lay flat this AM w/o SOB  Scheduled Meds:   ceFAZolin  (ANCEF ) IV  3 g Intravenous On Call   fluticasone   2 spray Each Nare Daily   fluticasone  furoate-vilanterol  1 puff Inhalation QPM   levETIRAcetam  250 mg Oral BID   levothyroxine  75 mcg Intravenous Daily   sodium chloride  flush  3 mL Intravenous Q12H   Continuous Infusions:   PRN Meds: acetaminophen  **OR** acetaminophen , ondansetron  **OR** ondansetron  (ZOFRAN ) IV, mouth rinse, polyethylene glycol   Vital Signs  Vitals:   01/08/24 1117 01/08/24 1710 01/08/24 2033 01/09/24 0043  BP: (!) 143/70 (!) 120/91 133/84 124/84  Pulse:   89 98  Resp: 17 18 19 18   Temp: 98.1 F (36.7 C) 98.2 F (36.8 C) 98.2 F (36.8 C) 98.5 F (36.9 C)  TempSrc: Oral Oral Oral Oral  SpO2:   94% 93%  Weight:      Height:       No intake or output data in the 24 hours ending 01/09/24 0814    01/02/2024    3:32 PM 01/02/2024    2:35 PM 10/23/2023    7:16 AM  Last 3 Weights  Weight (lbs) 264 lb 15.9 oz 265 lb 265 lb  Weight (kg) 120.2 kg 120.203 kg 120.203 kg      Telemetry  AFib 100's - Personally Reviewed  ECG   No new EKGs  - Personally Reviewed  Physical Exam  Unchanged exam GEN: No acute distress.   Neck: No JVD Cardiac: irreg-irreg, no murmurs, rubs, or gallops.  Respiratory: soft crackles in the bases b/l GI: Soft, nontender, non-distended obese MS: No edema; No deformity. Neuro:  Nonfocal  Psych: Normal affect   Labs High Sensitivity Troponin:  No results for input(s): TROPONINIHS in the last 720 hours.   Chemistry Recent Labs  Lab 01/02/24 1557 01/03/24 0452 01/04/24 1556 01/05/24 0450 01/05/24 1219 01/06/24 0448 01/07/24 0423 01/08/24 0356  01/09/24 0437  NA 140   < > 141  --  142   < > 139 140 141  K 4.3   < > 4.0  --  4.1   < > 4.0 4.8 3.9  CL 100   < > 100  --  102   < > 98 103 101  CO2 27   < > 33*  --  28   < > 31 28 30   GLUCOSE 121*   < > 149*  --  175*   < > 108* 120* 106*  BUN 19   < > 13  --  14   < > 13 14 16   CREATININE 1.81*   < > 1.29*  --  1.25*   < > 1.01* 1.08* 1.09*  CALCIUM 8.7*   < > 8.7*  --  9.1   < > 9.0 9.2 9.4  MG 2.6*  --   --    < >  --    < > 2.1 2.2 2.1  PROT 7.5  --  5.4*  --  6.3*  --   --   --   --   ALBUMIN 4.3  --  2.7*  --  3.3*  --   --   --   --   AST 19  --  19  --  18  --   --   --   --   ALT 9  --  9  --  10  --   --   --   --   ALKPHOS 57  --  38  --  38  --   --   --   --   BILITOT 0.7  --  0.7  --  0.7  --   --   --   --   GFRNONAA 27*   < > 41*  --  43*   < > 55* 51* 50*  ANIONGAP 13   < > 8  --  12   < > 10 9 10    < > = values in this interval not displayed.    Lipids No results for input(s): CHOL, TRIG, HDL, LABVLDL, LDLCALC, CHOLHDL in the last 168 hours.  Hematology Recent Labs  Lab 01/07/24 0423 01/08/24 0356 01/09/24 0437  WBC 5.1 4.8 4.5  RBC 4.21 4.52 4.00  HGB 9.8* 10.5* 9.4*  HCT 33.5* 36.5 31.9*  MCV 79.6* 80.8 79.8*  MCH 23.3* 23.2* 23.5*  MCHC 29.3* 28.8* 29.5*  RDW 22.7* 23.2* 23.2*  PLT 165 143* 168   Thyroid   Recent Labs  Lab 01/02/24 1557 01/02/24 1822  TSH 78.200*  --   FREET4  --  0.35*    BNP Recent Labs  Lab 01/02/24 1557  PROBNP 552.0*    DDimer No results for input(s): DDIMER in the last 168 hours.   Radiology  DG Chest 2 View Result Date: 01/08/2024 EXAM: 2 VIEW(S) XRAY OF THE CHEST 01/08/2024 12:30:00 PM COMPARISON: Chest radiograph 01/02/2024. CLINICAL HISTORY: Bradycardia, hypoxia. FINDINGS: LUNGS AND PLEURA: Low lung volumes. Mild bilateral central perihilar prominence could reflect bronchovascular crowding secondary to hypoinflation or central pulmonary vascular congestion. No focal pulmonary opacity. No pleural  effusion. No pneumothorax. HEART AND MEDIASTINUM: No acute abnormality of the cardiac and mediastinal silhouettes. BONES AND SOFT TISSUES: No acute osseous abnormality. IMPRESSION: 1. Mild bilateral central perihilar prominence could reflect bronchovascular crowding secondary to hypoinflation or central pulmonary vascular congestion. Electronically signed by: Harrietta Sherry MD 01/08/2024 04:29 PM EDT RP Workstation: [id number]    Cardiac Studies  01/03/24: TTE 1. Left ventricular ejection fraction, by estimation, is 60 to 65%. The  left ventricle has normal function. The left ventricle has no regional  wall motion abnormalities. There is mild concentric left ventricular  hypertrophy. Left ventricular diastolic  parameters are consistent with Grade III diastolic dysfunction  (restrictive). Elevated left atrial pressure.   2. Right ventricular systolic function is normal. The right ventricular  size is normal.   3. Left atrial size was mildly dilated.   4. Right atrial size was mildly dilated.   5. The mitral valve is normal in structure. Mild mitral valve  regurgitation. No evidence of mitral stenosis.   6. The aortic valve is normal in structure. Aortic valve regurgitation is  not visualized. No aortic stenosis is present.   7. The inferior vena cava is normal in size with greater than 50%  respiratory variability, suggesting right atrial pressure of 3 mmHg.  Patient Profile   83 y.o. female w/PMHx of  HTN, HLD, COPD, OSA Chronic CHF (HFpEF) AFib (found 2020, started amiodarone  May 2025) AFlutter has also been described  Historically: 2021  LHC normal May 2025 found to be in atrial flutter   Started amiodarone .  Taking Eliquis  initially daily, not bid 07/2123  In NSR  Bradycardic   Lopressor  decreased to 25 bid     June 2025  Tarry stools   Hgb 8.24 September 2023  EGD normal Colonoscopy showed  multiple polyps and diverticulosis.   Hemorrhoids   Capsule endoscopy also done   Pt  remains OFF of Eliquis   ON low dose amiodarone  (100mg  daily) and lopressor  50mg  BID (Taken day of admission 01/02/24 ~ noon)     ADMITTED: 01/02/24  with bradycardia/hypotension She was found in a junctional bradycardia >> cards consulted With stable BP, not in any acute distress > advised to hold home amio/BB follow   Subsequently found w/marked hypothyroid and started on levothyroxine with IM service   EP brief note 01/04/24 > rec continued tx of her thyroid  and monitor if continued brady despite thyroid  correction then would pursue pacing She was seen by Dr. 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 Patient is followed by GI as an outpatient. Planning with push enteroscopy since a small bowel lesion was noted on capsule endoscopy.  We will not be able to pursue rhythm control strategy until felt safe/able to be on anticoagulation again Has been in Afib since 01/05/24 ~ 1200noon In d/w cards APP, in her d/w attending cardiologist last week > given GIB and GI  w/u still in progress out patient > did not want to re-challange on a/c here     3. Tachy-brady With amio/lopressor  out pt, HRs 40's >> held here Now with AFib rates 100s or so Planned tentatively for PPM now tomorrow Anticipate dual chamber device RBBB Preserved LVEF  Will make NPO after breakfast today pending EP/lab schedule development today   4. HFpEF 5. COPD No I/O charted Sats low 90's this morning on 3L Pt reports excellent urine OP yesterday w/lasix  Will resume home torsemide  and PRN inhaler BMET in AM  Home meds and management otherwise as per IM/attending service     For questions or updates, please contact [facility] Please consult [email or web address] for contact info under     Signed, Charlies Macario Arthur, PA-C  01/09/2024, 8:14 AM

## 2024-01-09 NOTE — Plan of Care (Signed)

## 2024-01-09 NOTE — TOC Progression Note (Signed)
 Transition of Care [facility]) - Progression Note    Patient Details  Name: Kim Dixon MRN: [id number] Date of Birth: 09-Feb-1941  [facility]) CM/SW Contact  Graves-Bigelow, Erminio Deems, RN Phone Number: 01/09/2024, 2:16 PM  Clinical Narrative:  Patient is planned tentatively for [facility] 01-10-24. Patient will have HH PT services via [facility] once stable to discharge home. ICM will continue to follow for additional disposition needs as the patient progresses.   Expected Discharge Plan: Home w Home Health Services Barriers to Discharge: No Barriers Identified  Expected Discharge Plan and Services In-house Referral: NA Discharge Planning Services: CM Consult Post Acute Care Choice: Home Health Living arrangements for the past 2 months: Single Family Home  HH Arranged: PT HH Agency: [facility] Date [facility] Agency Contacted: 01/04/24 Time HH Agency Contacted: 1437 Representative spoke with at [facility] Agency: Darleene   Social Drivers of Health (SDOH) Interventions SDOH Screenings   Food Insecurity: No Food Insecurity (01/02/2024)  Housing: Low Risk  (01/02/2024)  Transportation Needs: No Transportation Needs (01/02/2024)  Utilities: Not At Risk (01/02/2024)  Social Connections: Moderately Integrated (01/02/2024)  Tobacco Use: Medium Risk (01/02/2024)    Readmission Risk Interventions     No data to display

## 2024-01-10 ENCOUNTER — Encounter (HOSPITAL_COMMUNITY)
Admission: EM | Disposition: A | Discharge status: Home Health | Payer: Self-pay | Source: Home / Self Care | Attending: Internal Medicine

## 2024-01-10 DIAGNOSIS — I10 Essential (primary) hypertension: Secondary | ICD-10-CM | POA: Diagnosis not present

## 2024-01-10 DIAGNOSIS — I495 Sick sinus syndrome: Secondary | ICD-10-CM | POA: Diagnosis not present

## 2024-01-10 DIAGNOSIS — I959 Hypotension, unspecified: Secondary | ICD-10-CM | POA: Diagnosis not present

## 2024-01-10 DIAGNOSIS — R001 Bradycardia, unspecified: Secondary | ICD-10-CM | POA: Diagnosis not present

## 2024-01-10 HISTORY — PX: PACEMAKER IMPLANT: EP1218

## 2024-01-10 LAB — BASIC METABOLIC PANEL WITH GFR
Anion gap: 12 (ref 5–15)
BUN: 19 mg/dL (ref 8–23)
CO2: 33 mmol/L — ABNORMAL HIGH (ref 22–32)
Calcium: 9.1 mg/dL (ref 8.9–10.3)
Chloride: 98 mmol/L (ref 98–111)
Creatinine, Ser: 1.31 mg/dL — ABNORMAL HIGH (ref 0.44–1.00)
GFR, Estimated: 40 mL/min — ABNORMAL LOW (ref >60)
Glucose, Bld: 99 mg/dL (ref 70–99)
Potassium: 3.7 mmol/L (ref 3.5–5.1)
Sodium: 143 mmol/L (ref 135–145)

## 2024-01-10 SURGERY — PACEMAKER IMPLANT

## 2024-01-10 MED ORDER — SODIUM CHLORIDE 0.9 % IV SOLN
250.0000 mL | INTRAVENOUS | Status: DC
  Administered 2024-01-10: 250 mL via INTRAVENOUS

## 2024-01-10 MED ORDER — SODIUM CHLORIDE 0.9 % IV SOLN: INTRAVENOUS | Status: DC

## 2024-01-10 MED ORDER — SODIUM CHLORIDE 0.9% FLUSH: 3.0000 mL | INTRAVENOUS | Status: DC | PRN

## 2024-01-10 MED ORDER — CHLORHEXIDINE GLUCONATE 4 % EX SOLN
CUTANEOUS | Status: AC
  Filled 2024-01-10: qty 15

## 2024-01-10 MED ORDER — CHLORHEXIDINE GLUCONATE 4 % EX SOLN
60.0000 mL | Freq: Once | CUTANEOUS | Status: AC
  Administered 2024-01-10: 4 via TOPICAL
  Filled 2024-01-10: qty 45

## 2024-01-10 MED ORDER — CEFAZOLIN SODIUM-DEXTROSE 3-4 GM/150ML-% IV SOLN
3.0000 g | INTRAVENOUS | Status: AC
  Administered 2024-01-10: 3 g via INTRAVENOUS
  Filled 2024-01-10: qty 150

## 2024-01-10 MED ORDER — HEPARIN (PORCINE) IN NACL 1000-0.9 UT/500ML-% IV SOLN
INTRAVENOUS | Status: DC | PRN
  Administered 2024-01-10: 500 mL

## 2024-01-10 MED ORDER — LIDOCAINE HCL 1 % IJ SOLN
INTRAMUSCULAR | Status: AC
  Filled 2024-01-10: qty 60

## 2024-01-10 MED ORDER — CHLORHEXIDINE GLUCONATE 4 % EX SOLN
60.0000 mL | Freq: Once | CUTANEOUS | Status: AC
  Administered 2024-01-10: 4 via TOPICAL
  Filled 2024-01-10: qty 15

## 2024-01-10 MED ORDER — SODIUM CHLORIDE 0.9 % IV SOLN
80.0000 mg | INTRAVENOUS | Status: AC
  Administered 2024-01-10: 80 mg
  Filled 2024-01-10: qty 2

## 2024-01-10 MED ORDER — SODIUM CHLORIDE 0.9 % IV SOLN
INTRAVENOUS | Status: AC
  Filled 2024-01-10: qty 2

## 2024-01-10 MED ORDER — LIDOCAINE HCL (PF) 1 % IJ SOLN
INTRAMUSCULAR | Status: DC | PRN
  Administered 2024-01-10: 50 mL

## 2024-01-10 SURGICAL SUPPLY — 8 items
CABLE SURGICAL S-101-97-12 (CABLE) ×1 IMPLANT
IPG PACE AZUR XT DR MRI W1DR01 (Pacemaker) IMPLANT
LEAD CAPSURE NOVUS 45CM (Lead) IMPLANT
LEAD CAPSURE NOVUS 5076-52CM (Lead) IMPLANT
PAD DEFIB RADIO PHYSIO CONN (PAD) ×1 IMPLANT
SHEATH 7FR PRELUDE SNAP 13 (SHEATH) IMPLANT
SHEATH PROBE COVER 6X72 (BAG) IMPLANT
TRAY PACEMAKER INSERTION (PACKS) ×1 IMPLANT

## 2024-01-10 NOTE — Plan of Care (Signed)
  Problem: Education: Goal: Knowledge of General Education information will improve Description: Including pain rating scale, medication(s)/side effects and non-pharmacologic comfort measures Outcome: Progressing   Problem: Activity: Goal: Risk for activity intolerance will decrease Outcome: Progressing   Problem: Nutrition: Goal: Adequate nutrition will be maintained Outcome: Progressing   Problem: Safety: Goal: Ability to remain free from injury will improve Outcome: Progressing   Problem: Health Behavior/Discharge Planning: Goal: Ability to manage health-related needs will improve Outcome: Progressing

## 2024-01-10 NOTE — Progress Notes (Signed)
 PT Cancellation Note  Patient Details Name: Kim Dixon MRN: [id number] DOB: 1941-02-27  Pt gone from the room on arrival to get pacemaker.  Will see as able 10/24.    Vinie LULLA [name] 01/10/2024, 6:55 PM

## 2024-01-10 NOTE — Plan of Care (Signed)
  Problem: Education: Goal: Knowledge of General Education information will improve Description: Including pain rating scale, medication(s)/side effects and non-pharmacologic comfort measures Outcome: Progressing   Problem: Pain Managment: Goal: General experience of comfort will improve and/or be controlled Outcome: Progressing

## 2024-01-10 NOTE — Progress Notes (Signed)
 Rounding Note   Patient Name: Kim Dixon Date of Encounter: 01/10/2024  [facility] Cardiologist: Jayson Sierras, MD   Subjective  Feels well, no SOB, no cough  Scheduled Meds:   ceFAZolin  (ANCEF ) IV  3 g Intravenous On Call   chlorhexidine   60 mL Topical Once   fluticasone   2 spray Each Nare Daily   fluticasone  furoate-vilanterol  1 puff Inhalation QPM   gentamicin  (GARAMYCIN ) 80 mg in sodium chloride  0.9 % 500 mL irrigation  80 mg Irrigation On Call   levETIRAcetam  250 mg Oral BID   levothyroxine  75 mcg Intravenous Daily   sodium chloride  flush  3 mL Intravenous Q12H   sodium chloride  flush  3 mL Intravenous Q12H   torsemide   20 mg Oral Daily   Continuous Infusions:  sodium chloride  51 mL/hr at 01/10/24 0607   sodium chloride  50 mL/hr at 01/10/24 0659    PRN Meds: acetaminophen  **OR** acetaminophen , albuterol , ondansetron  **OR** ondansetron  (ZOFRAN ) IV, mouth rinse, polyethylene glycol, sodium chloride  flush   Vital Signs  Vitals:   01/09/24 2331 01/09/24 2332 01/10/24 0433 01/10/24 0800  BP:  127/71 127/74 (!) 143/75  Pulse: (!) 115 (!) 107 64 61  Resp:  20 18 18   Temp: 98 F (36.7 C) 98 F (36.7 C) 97.7 F (36.5 C) 98.4 F (36.9 C)  TempSrc: Oral Oral Oral Oral  SpO2:  94% 96%   Weight:      Height:        Intake/Output Summary (Last 24 hours) at 01/10/2024 0900 Last data filed at 01/10/2024 0659 Gross per 24 hour  Intake 44.34 ml  Output 200 ml  Net -155.66 ml      01/02/2024    3:32 PM 01/02/2024    2:35 PM 10/23/2023    7:16 AM  Last 3 Weights  Weight (lbs) 264 lb 15.9 oz 265 lb 265 lb  Weight (kg) 120.2 kg 120.203 kg 120.203 kg      Telemetry  AFib 100's - Personally Reviewed  ECG   No new EKGs  - Personally Reviewed  Physical Exam  Unchanged exam GEN: No acute distress.   Neck: No JVD Cardiac: , no murmurs, rubs, or gallops.  Respiratory: CTA b/l GI: Soft, nontender, non-distended obese MS: No edema; No  deformity. Neuro:  Nonfocal  Psych: Normal affect   Labs High Sensitivity Troponin:  No results for input(s): TROPONINIHS in the last 720 hours.   Chemistry Recent Labs  Lab 01/04/24 1556 01/05/24 0450 01/05/24 1219 01/06/24 0448 01/07/24 0423 01/08/24 0356 01/09/24 0437 01/10/24 0410  NA 141  --  142   < > 139 140 141 143  K 4.0  --  4.1   < > 4.0 4.8 3.9 3.7  CL 100  --  102   < > 98 103 101 98  CO2 33*  --  28   < > 31 28 30  33*  GLUCOSE 149*  --  175*   < > 108* 120* 106* 99  BUN 13  --  14   < > 13 14 16 19   CREATININE 1.29*  --  1.25*   < > 1.01* 1.08* 1.09* 1.31*  CALCIUM 8.7*  --  9.1   < > 9.0 9.2 9.4 9.1  MG  --    < >  --    < > 2.1 2.2 2.1  --   PROT 5.4*  --  6.3*  --   --   --   --   --  ALBUMIN 2.7*  --  3.3*  --   --   --   --   --   AST 19  --  18  --   --   --   --   --   ALT 9  --  10  --   --   --   --   --   ALKPHOS 38  --  38  --   --   --   --   --   BILITOT 0.7  --  0.7  --   --   --   --   --   GFRNONAA 41*  --  43*   < > 55* 51* 50* 40*  ANIONGAP 8  --  12   < > 10 9 10 12    < > = values in this interval not displayed.    Lipids No results for input(s): CHOL, TRIG, HDL, LABVLDL, LDLCALC, CHOLHDL in the last 168 hours.  Hematology Recent Labs  Lab 01/07/24 0423 01/08/24 0356 01/09/24 0437  WBC 5.1 4.8 4.5  RBC 4.21 4.52 4.00  HGB 9.8* 10.5* 9.4*  HCT 33.5* 36.5 31.9*  MCV 79.6* 80.8 79.8*  MCH 23.3* 23.2* 23.5*  MCHC 29.3* 28.8* 29.5*  RDW 22.7* 23.2* 23.2*  PLT 165 143* 168   Thyroid   No results for input(s): TSH, FREET4 in the last 168 hours.   BNP No results for input(s): BNP, PROBNP in the last 168 hours.   DDimer No results for input(s): DDIMER in the last 168 hours.   Radiology  DG Chest 2 View Result Date: 01/08/2024 EXAM: 2 VIEW(S) XRAY OF THE CHEST 01/08/2024 12:30:00 PM COMPARISON: Chest radiograph 01/02/2024. CLINICAL HISTORY: Bradycardia, hypoxia. FINDINGS: LUNGS AND PLEURA: Low lung volumes.  Mild bilateral central perihilar prominence could reflect bronchovascular crowding secondary to hypoinflation or central pulmonary vascular congestion. No focal pulmonary opacity. No pleural effusion. No pneumothorax. HEART AND MEDIASTINUM: No acute abnormality of the cardiac and mediastinal silhouettes. BONES AND SOFT TISSUES: No acute osseous abnormality. IMPRESSION: 1. Mild bilateral central perihilar prominence could reflect bronchovascular crowding secondary to hypoinflation or central pulmonary vascular congestion. Electronically signed by: Harrietta Sherry MD 01/08/2024 04:29 PM EDT RP Workstation: [id number]    Cardiac Studies  01/03/24: TTE 1. Left ventricular ejection fraction, by estimation, is 60 to 65%. The  left ventricle has normal function. The left ventricle has no regional  wall motion abnormalities. There is mild concentric left ventricular  hypertrophy. Left ventricular diastolic  parameters are consistent with Grade III diastolic dysfunction  (restrictive). Elevated left atrial pressure.   2. Right ventricular systolic function is normal. The right ventricular  size is normal.   3. Left atrial size was mildly dilated.   4. Right atrial size was mildly dilated.   5. The mitral valve is normal in structure. Mild mitral valve  regurgitation. No evidence of mitral stenosis.   6. The aortic valve is normal in structure. Aortic valve regurgitation is  not visualized. No aortic stenosis is present.   7. The inferior vena cava is normal in size with greater than 50%  respiratory variability, suggesting right atrial pressure of 3 mmHg.  Patient Profile   83 y.o. female w/PMHx of  HTN, HLD, COPD, OSA Chronic CHF (HFpEF) AFib (found 2020, started amiodarone  May 2025) AFlutter has also been described  Historically: 2021  LHC normal May 2025 found to be in atrial flutter   Started amiodarone .  Taking Eliquis  initially daily, not  bid 07/2123  In NSR  Bradycardic   Lopressor   decreased to 25 bid     June 2025  Tarry stools   Hgb 8.24 September 2023  EGD normal Colonoscopy showed  multiple polyps and diverticulosis.   Hemorrhoids   Capsule endoscopy also done   Pt remains OFF of Eliquis   ON low dose amiodarone  (100mg  daily) and lopressor  50mg  BID (Taken day of admission 01/02/24 ~ noon)     ADMITTED: 01/02/24  with bradycardia/hypotension She was found in a junctional bradycardia >> cards consulted With stable BP, not in any acute distress > advised to hold home amio/BB follow   Subsequently found w/marked hypothyroid and started on levothyroxine with IM service   EP brief note 01/04/24 > rec continued tx of her thyroid  and monitor if continued brady despite thyroid  correction then would pursue pacing She was seen by Dr. 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 Tachy-brady With amio/lopressor  out pt, HRs 40's >> held here Now with AFib rates 100s or so Planned tentatively for PPM now tomorrow Anticipate dual chamber device RBBB Preserved LVEF  PPM implant today, revisited procedure, potential risks.benefits She remains agreeable Plan R sided implant given subcutaneous mass L shoulder (chronic for years)   4. HFpEF 5. COPD 95% on RA today   Home meds and management otherwise as per IM/attending service     For questions or updates, please contact [facility] Please consult [email or web address] for contact info under     Signed, Charlies Macario Arthur, PA-C  01/10/2024, 9:00 AM

## 2024-01-10 NOTE — Progress Notes (Signed)
 [facility]  PROGRESS NOTE  Kim Dixon FMW:[id number] DOB: 09/03/1940 DOA: 01/02/2024 PCP: Sheryle Carwin, MD   Brief HPI:   83 y.o. female with medical history significant for atrial fibrillation, hypertension, asthma, depression, OSA was sent from pulmonary office with complaints of low heart rate and low blood pressure.  Patient reported that over the last 2 months she had increasing fatigue, somnolence with difficulty breathing especially on exertion with chest tightness and near passing out spells.  Patient was on metoprolol  and amiodarone  at home.  She had seen her outpatient provider and amiodarone  and metoprolol  were held.  Patient was then admitted to the hospital for further evaluation and treatment. At this time, patient has been seen by electrophysiology cardiology and plans for permanent pacemaker placement.       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   Hypothyroidism - TSH markedly elevated at 78.2 with low T3 and free T4 - Started on IV Synthroid 75 mcg daily - Continue to hold amiodarone  - Will need Synthroid at discharge  Acute kidney injury - Initial creatinine of 1.3 - Torsemide  20 mg daily restarted  Atrial fibrillation - Presented with marked sinus bradycardia - Metoprolol  and amiodarone  on hold - No anticoagulation due to history of GI bleed - Patient is not a candidate for ablation due to age and obesity - Plan for pacemaker placement as above  Hypertension - Antihypertensive including losartan , metoprolol , on hold due to low blood pressure - Blood pressure is stable at this time  Seizure disorder - Continue Keppra  Chronic diastolic CHF -  Torsemide  restarted at 20 mg daily  Obstructive sleep apnea - Uses CPAP at bedtime      DVT prophylaxis: SCDs  Medications      ceFAZolin  (ANCEF ) IV  3 g Intravenous On Call   chlorhexidine   60 mL Topical Once   fluticasone   2 spray Each Nare Daily   fluticasone  furoate-vilanterol  1 puff Inhalation QPM   gentamicin  (GARAMYCIN ) 80 mg in sodium chloride  0.9 % 500 mL irrigation  80 mg Irrigation On Call   levETIRAcetam  250 mg Oral BID   levothyroxine  75 mcg Intravenous Daily   sodium chloride  flush  3 mL Intravenous Q12H   sodium chloride  flush  3 mL Intravenous Q12H   torsemide   20 mg Oral Daily     Data Reviewed:   CBG:  No results for input(s): GLUCAP in the last 168 hours.  SpO2: 96 % O2 Flow Rate (L/min): 3 L/min FiO2 (%): 21 %    Vitals:   01/09/24 2331 01/09/24 2332 01/10/24 0433 01/10/24 0800  BP:  127/71 127/74 (!) 143/75  Pulse: (!) 115 (!) 107 64 61  Resp:  20 18 18   Temp: 98 F (36.7 C) 98 F (36.7 C) 97.7 F (36.5 C) 98.4 F (36.9 C)  TempSrc: Oral Oral Oral Oral  SpO2:  94% 96%   Weight:      Height:          Data Reviewed:  Basic Metabolic Panel: Recent Labs  Lab 01/05/24 0450 01/05/24 1219 01/06/24 0448 01/07/24 0423 01/08/24 0356 01/09/24 0437 01/10/24 0410  NA  --    < > 143 139 140 141 143  K  --    < >  4.0 4.0 4.8 3.9 3.7  CL  --    < > 101 98 103 101 98  CO2  --    < > 33* 31 28 30  33*  GLUCOSE  --    < > 114* 108* 120* 106* 99  BUN  --    < > 9 13 14 16 19   CREATININE  --    < > 1.10* 1.01* 1.08* 1.09* 1.31*  CALCIUM  --    < > 9.0 9.0 9.2 9.4 9.1  MG 2.3  --  2.2 2.1 2.2 2.1  --   PHOS  --   --   --   --  5.0*  --   --    < > = values in this interval not displayed.    CBC: Recent Labs  Lab 01/05/24 0450 01/06/24 0448 01/07/24 0423 01/08/24 0356 01/09/24 0437  WBC 4.9 5.4 5.1 4.8 4.5  HGB 9.8* 10.3* 9.8* 10.5* 9.4*  HCT 33.7* 36.0 33.5* 36.5 31.9*  MCV 80.6 81.8 79.6* 80.8 79.8*  PLT 163 169 165 143*  168    LFT Recent Labs  Lab 01/04/24 1556 01/05/24 1219  AST 19 18  ALT 9 10  ALKPHOS 38 38  BILITOT 0.7 0.7  PROT 5.4* 6.3*  ALBUMIN 2.7* 3.3*     Antibiotics: Anti-infectives (From admission, onward)    Start     Dose/Rate Route Frequency Ordered Stop   01/10/24 0800  gentamicin  (GARAMYCIN ) 80 mg in sodium chloride  0.9 % 500 mL irrigation        80 mg Irrigation On call 01/10/24 0121 01/11/24 0800   01/10/24 0800  ceFAZolin  (ANCEF ) IVPB 3g/150 mL premix        3 g 300 mL/hr over 30 Minutes Intravenous On call 01/10/24 0121 01/11/24 0800   01/08/24 1030  ceFAZolin  (ANCEF ) IVPB 3g/150 mL premix        3 g 300 mL/hr over 30 Minutes Intravenous On call 01/08/24 0942 01/09/24 1030   01/08/24 0937  sodium chloride  0.9 % with gentamicin  (GARAMYCIN ) ADS Med  Status:  Discontinued       Note to Pharmacy: Wilkie Pa G: cabinet override      01/08/24 9062 01/08/24 0947   01/07/24 0845  gentamicin  (GARAMYCIN ) 80 mg in sodium chloride  0.9 % 500 mL irrigation        80 mg Irrigation On call 01/07/24 0749 01/08/24 0845   01/07/24 0845  ceFAZolin  (ANCEF ) IVPB 3g/150 mL premix        3 g 300 mL/hr over 30 Minutes Intravenous On call 01/07/24 0749 01/08/24 0845        CONSULTS cardiology  Code Status: Full code  Family Communication: Discussed with family at bedside     Subjective   Denies chest pain or shortness of breath   Objective    Physical Examination:  General-appears in no acute distress Heart-S1-S2, regular, no murmur auscultated Lungs-clear to auscultation bilaterally, no wheezing or crackles auscultated Abdomen-soft, nontender, no organomegaly Extremities-no edema in the lower extremities Neuro-alert, oriented x3, no focal deficit noted   Status is: Inpatient:             [name] Brod   [facility] If 7PM-7AM, please contact night-coverage at [email or web address], Office  [phone]   01/10/2024, 8:43 AM  LOS: 5 days

## 2024-01-10 NOTE — Progress Notes (Signed)
 Mobility Specialist Progress Note;   01/10/24 0953  Mobility  Activity Ambulated with assistance  Level of Assistance Standby assist, set-up cues, supervision of patient - no hands on  Assistive Device None  Distance Ambulated (ft) 200 ft  Activity Response Tolerated well  Mobility Referral Yes  Mobility visit 1 Mobility  Mobility Specialist Start Time (ACUTE ONLY) 0953  Mobility Specialist Stop Time (ACUTE ONLY) 1014  Mobility Specialist Time Calculation (min) (ACUTE ONLY) 21 min   Pt in chair upon arrival, eager for mobility, on RA. HR fluctuated between 59-130s bpm, pt asx. Able to ambulate on RA. Pt returned back to chair and left with all needs met. Family present.   Lauraine Erm Mobility Specialist Please contact via SecureChat or [employer] [phone]

## 2024-01-10 NOTE — Progress Notes (Signed)
 Notified by CCMD that patient's O2 saturations sporadically dropping to 70% range and returning to high 80% range.  Responded to room; patient sleeping heavily and snoring.  She has documented history of sleep apnea and earlier declined the hospital CPAP for a rest period following pacemaker placement earlier today.  Patient awakened and advised regarding SPO2 dropping while asleep; she again declined hospital CPAP.  She agreed to nasal canula O2 at 2L/m; brisk return of O2 saturations to 99-100% on exiting patient's room.  Currently remains 98-99% while asleep; no other changes noted.  Will continue to monitor closely.

## 2024-01-11 ENCOUNTER — Ambulatory Visit: Admitting: Cardiology

## 2024-01-11 ENCOUNTER — Encounter (HOSPITAL_COMMUNITY): Payer: Self-pay | Admitting: Cardiology

## 2024-01-11 ENCOUNTER — Inpatient Hospital Stay (HOSPITAL_COMMUNITY)

## 2024-01-11 DIAGNOSIS — I959 Hypotension, unspecified: Secondary | ICD-10-CM | POA: Diagnosis not present

## 2024-01-11 DIAGNOSIS — Z950 Presence of cardiac pacemaker: Secondary | ICD-10-CM | POA: Diagnosis not present

## 2024-01-11 DIAGNOSIS — I4891 Unspecified atrial fibrillation: Secondary | ICD-10-CM | POA: Diagnosis not present

## 2024-01-11 DIAGNOSIS — J90 Pleural effusion, not elsewhere classified: Secondary | ICD-10-CM | POA: Diagnosis not present

## 2024-01-11 DIAGNOSIS — G4733 Obstructive sleep apnea (adult) (pediatric): Secondary | ICD-10-CM

## 2024-01-11 DIAGNOSIS — I10 Essential (primary) hypertension: Secondary | ICD-10-CM | POA: Diagnosis not present

## 2024-01-11 DIAGNOSIS — R001 Bradycardia, unspecified: Secondary | ICD-10-CM | POA: Diagnosis not present

## 2024-01-11 DIAGNOSIS — R918 Other nonspecific abnormal finding of lung field: Secondary | ICD-10-CM | POA: Diagnosis not present

## 2024-01-11 MED ORDER — LEVOTHYROXINE SODIUM 50 MCG PO TABS: 50.0000 ug | ORAL_TABLET | Freq: Every day | ORAL | 2 refills | Status: AC

## 2024-01-11 MED ORDER — LEVOTHYROXINE SODIUM 50 MCG PO TABS: 50.0000 ug | ORAL_TABLET | Freq: Every day | ORAL | Status: DC

## 2024-01-11 MED ORDER — METOPROLOL TARTRATE 25 MG PO TABS: 25.0000 mg | ORAL_TABLET | Freq: Two times a day (BID) | ORAL | 1 refills | Status: DC

## 2024-01-11 MED ORDER — LEVOTHYROXINE SODIUM 50 MCG PO TABS: 50.0000 ug | ORAL_TABLET | Freq: Every day | ORAL | 2 refills | Status: DC

## 2024-01-11 NOTE — Progress Notes (Addendum)
 Rounding Note   Patient Name: Kim Dixon Date of Encounter: 01/11/2024  [facility] Cardiologist: Jayson Sierras, MD   Subjective  Feels well, no SOB, no cough, mild discomfort at her implant site  Scheduled Meds:  fluticasone   2 spray Each Nare Daily   fluticasone  furoate-vilanterol  1 puff Inhalation QPM   levETIRAcetam  250 mg Oral BID   levothyroxine  75 mcg Intravenous Daily   sodium chloride  flush  3 mL Intravenous Q12H   sodium chloride  flush  3 mL Intravenous Q12H   torsemide   20 mg Oral Daily   Continuous Infusions:    PRN Meds: acetaminophen  **OR** acetaminophen , albuterol , ondansetron  **OR** ondansetron  (ZOFRAN ) IV, mouth rinse, polyethylene glycol   Vital Signs  Vitals:   01/10/24 1500 01/10/24 2054 01/11/24 0027 01/11/24 0451  BP: (!) 132/59 (!) 150/70 (!) 152/79 (!) 148/65  Pulse: (!) 59 (!) 59 (!) 59 64  Resp:  18 20 18   Temp: 98.4 F (36.9 C) 98.4 F (36.9 C) 98.2 F (36.8 C) 98.6 F (37 C)  TempSrc: Oral Oral Oral Oral  SpO2:  96% 99% 98%  Weight:      Height:        Intake/Output Summary (Last 24 hours) at 01/11/2024 0818 Last data filed at 01/11/2024 0028 Gross per 24 hour  Intake 0 ml  Output --  Net 0 ml      01/02/2024    3:32 PM 01/02/2024    2:35 PM 10/23/2023    7:16 AM  Last 3 Weights  Weight (lbs) 264 lb 15.9 oz 265 lb 265 lb  Weight (kg) 120.2 kg 120.203 kg 120.203 kg      Telemetry  AFib 100's - Personally Reviewed  ECG   No new EKGs  - Personally Reviewed  Physical Exam   GEN: No acute distress.   Neck: No JVD Cardiac: , no murmurs, rubs, or gallops.  Respiratory: CTA b/l, no wheezing no absent BS GI: Soft, nontender, non-distended obese MS: No edema; No deformity. Neuro:  Nonfocal  Psych: Normal affect   PPM implant site: stable, steri strips in place, no bleeding or hematoma  Labs High Sensitivity Troponin:  No results for input(s): TROPONINIHS in the last 720 hours.    Chemistry Recent Labs  Lab 01/04/24 1556 01/05/24 0450 01/05/24 1219 01/06/24 0448 01/07/24 0423 01/08/24 0356 01/09/24 0437 01/10/24 0410  NA 141  --  142   < > 139 140 141 143  K 4.0  --  4.1   < > 4.0 4.8 3.9 3.7  CL 100  --  102   < > 98 103 101 98  CO2 33*  --  28   < > 31 28 30  33*  GLUCOSE 149*  --  175*   < > 108* 120* 106* 99  BUN 13  --  14   < > 13 14 16 19   CREATININE 1.29*  --  1.25*   < > 1.01* 1.08* 1.09* 1.31*  CALCIUM 8.7*  --  9.1   < > 9.0 9.2 9.4 9.1  MG  --    < >  --    < > 2.1 2.2 2.1  --   PROT 5.4*  --  6.3*  --   --   --   --   --   ALBUMIN 2.7*  --  3.3*  --   --   --   --   --   AST 19  --  18  --   --   --   --   --   ALT 9  --  10  --   --   --   --   --   ALKPHOS 38  --  38  --   --   --   --   --   BILITOT 0.7  --  0.7  --   --   --   --   --   GFRNONAA 41*  --  43*   < > 55* 51* 50* 40*  ANIONGAP 8  --  12   < > 10 9 10 12    < > = values in this interval not displayed.    Lipids No results for input(s): CHOL, TRIG, HDL, LABVLDL, LDLCALC, CHOLHDL in the last 168 hours.  Hematology Recent Labs  Lab 01/07/24 0423 01/08/24 0356 01/09/24 0437  WBC 5.1 4.8 4.5  RBC 4.21 4.52 4.00  HGB 9.8* 10.5* 9.4*  HCT 33.5* 36.5 31.9*  MCV 79.6* 80.8 79.8*  MCH 23.3* 23.2* 23.5*  MCHC 29.3* 28.8* 29.5*  RDW 22.7* 23.2* 23.2*  PLT 165 143* 168   Thyroid   No results for input(s): TSH, FREET4 in the last 168 hours.   BNP No results for input(s): BNP, PROBNP in the last 168 hours.   DDimer No results for input(s): DDIMER in the last 168 hours.   Radiology  EP PPM/ICD IMPLANT Result Date: 01/10/2024  CONCLUSIONS:  1. Symptomatic bradycardia  2. Successful dual chamber permanent pacemaker implantation  3.  No early apparent complications.    Cardiac Studies  01/03/24: TTE 1. Left ventricular ejection fraction, by estimation, is 60 to 65%. The  left ventricle has normal function. The left ventricle has no regional  wall  motion abnormalities. There is mild concentric left ventricular  hypertrophy. Left ventricular diastolic  parameters are consistent with Grade III diastolic dysfunction  (restrictive). Elevated left atrial pressure.   2. Right ventricular systolic function is normal. The right ventricular  size is normal.   3. Left atrial size was mildly dilated.   4. Right atrial size was mildly dilated.   5. The mitral valve is normal in structure. Mild mitral valve  regurgitation. No evidence of mitral stenosis.   6. The aortic valve is normal in structure. Aortic valve regurgitation is  not visualized. No aortic stenosis is present.   7. The inferior vena cava is normal in size with greater than 50%  respiratory variability, suggesting right atrial pressure of 3 mmHg.  Patient Profile   83 y.o. female w/PMHx of  HTN, HLD, COPD, OSA Chronic CHF (HFpEF) AFib (found 2020, started amiodarone  May 2025) AFlutter has also been described  Historically: 2021  LHC normal May 2025 found to be in atrial flutter   Started amiodarone .  Taking Eliquis  initially daily, not bid 07/2123  In NSR  Bradycardic   Lopressor  decreased to 25 bid     June 2025  Tarry stools   Hgb 8.24 September 2023  EGD normal Colonoscopy showed  multiple polyps and diverticulosis.   Hemorrhoids   Capsule endoscopy also done   Pt remains OFF of Eliquis   ON low dose amiodarone  (100mg  daily) and lopressor  50mg  BID (Taken day of admission 01/02/24 ~ noon)     ADMITTED: 01/02/24  with bradycardia/hypotension She was found in a junctional bradycardia >> cards consulted With stable BP, not in any acute distress > advised to hold home amio/BB follow  Subsequently found w/marked hypothyroid and started on levothyroxine with IM service   EP brief note 01/04/24 > rec continued tx of her thyroid  and monitor if continued brady despite thyroid  correction then would pursue pacing She was seen by Dr. 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 Tachy-brady With amio/lopressor  out pt, HRs 40's >> held here Now with AFib rates 100s or so Planned tentatively for PPM now tomorrow Anticipate dual chamber device RBBB Preserved LVEF  S/p PPM implant yesterday 10/23 w/Dr. Cindie. Device check this morning with stable measurements CXR done, pending read (lead placement appear stable in review w/MD, no obvious PTX) Site is stable Wound care and activity instructions were reviewed with the patient EP follow up is in place RP MD has been bedside, OK to discharge once CXR is formally read and negative for pneumo If felt ready medically otherwise by IM/attending service   4. HFpEF 5. COPD On 4L overnight, she is intolerant of our CPAP mask here On RA with me at bedside 93-95% She is volume stable Further as per attending IM service    Home meds and management otherwise as per IM/attending service     For questions or updates, please contact  [facility] Please consult [email or web address] for contact info under     Signed, Charlies Macario Arthur, PA-C  01/11/2024, 8:18 AM    I have seen and examined this patient with Charlies Arthur.  Agree with above, note added to reflect my findings.  On exam, RRR, no murmurs.  She is now status post pacemaker for tachy/brady syndrome.  Device functioning appropriately.  Sensing, threshold, impedance within normal limits as expected post implant.  Chest x-ray and interrogation without issue.  OK for discharge today with follow-up in device clinic. Further therapy per primary team.  Spirit Wernli M. Icyss Skog MD 01/11/2024 9:37 AM

## 2024-01-11 NOTE — Progress Notes (Signed)
 CXR is negative for PTX,  RA sat was good this morning with me, lungs were clear without exam findings of volume OL Charlies Arthur, PA_C

## 2024-01-11 NOTE — Discharge Summary (Signed)
 Physician Discharge Summary   Patient: Kim Dixon MRN: [id number] DOB: 26-Mar-1940  Admit date:     01/02/2024  Discharge date: 01/11/24  Discharge Physician: [name] Brod   PCP: Sheryle Carwin, MD   Recommendations at discharge:   Follow-up cardiology as outpatient Check TSH in 6 weeks at PCP office  Discharge Diagnoses: Principal Problem:   Symptomatic bradycardia Active Problems:   Hypotension   Atrial fibrillation (HCC)   Essential hypertension   OSA (obstructive sleep apnea)   Mild persistent asthma  Resolved Problems:   * No resolved hospital problems. [name] course 83 y.o. female with medical history significant for atrial fibrillation, hypertension, asthma, depression, OSA was sent from pulmonary office with complaints of low heart rate and low blood pressure.  Patient reported that over the last 2 months she had increasing fatigue, somnolence with difficulty breathing especially on exertion with chest tightness and near passing out spells.  Patient was on metoprolol  and amiodarone  at home.  She had seen her outpatient provider and amiodarone  and metoprolol  were held.  Patient was then admitted to the hospital for further evaluation and treatment. At this time, patient has been seen by electrophysiology cardiology and plans for permanent pacemaker placement.  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 - History of severe hypothyroidism with episodes of sinus pauses on 01/04/2024 - Has been started on Synthroid due to elevated TSH - Amiodarone   has been discontinued due to elevated TSH -Status post pacemaker placement, cardiology recommends to restart metoprolol      Hypothyroidism - TSH markedly elevated at 78.2 with low T3 and free T4 - Started on IV Synthroid 75 mcg daily - Continue to hold amiodarone  - Will start Synthroid 50 mcg daily -Follow-up with PCP to check TSH in 6 weeks   Acute kidney injury -  Initial creatinine of 1.3 - Torsemide  20 mg daily restarted   Atrial fibrillation - Presented with marked sinus bradycardia - Metoprolol  and amiodarone  on hold - No anticoagulation due to history of GI bleed - Patient is not a candidate for ablation due to age and obesity - Status post pacemaker placement as above   Hypertension - Antihypertensive including losartan , metoprolol , on hold due to low blood pressure - Blood pressure is stable at this time -Will restart metoprolol  at low-dose of 25 mg p.o. twice daily.  Discontinue losartan    Seizure disorder - Continue Keppra   Chronic diastolic CHF - Torsemide  restarted at 20 mg daily   Obstructive sleep apnea - Uses CPAP at bedtime           Consultants: Cardiology Procedures performed: Pacemaker placement Disposition: Home Diet recommendation:  Discharge Diet Orders (From admission, onward)     Start     Ordered   01/11/24 0000  Diet - low sodium heart healthy        01/11/24 1217           Regular diet DISCHARGE MEDICATION: Allergies as of 01/11/2024       Reactions   Naproxen Sodium Other (See Comments)   Tightness in throat        Medication List     STOP taking these medications    amiodarone  200 MG tablet Commonly known as: PACERONE    losartan  25 MG tablet Commonly known as: COZAAR        TAKE these medications    acetaminophen  500 MG tablet Commonly known as: TYLENOL  Take 1,000 mg by mouth every 6 (six) hours as needed  for mild pain (pain score 1-3).   albuterol  108 (90 Base) MCG/ACT inhaler Commonly known as: VENTOLIN  HFA Inhale 2 puffs into the lungs every 6 (six) hours as needed for wheezing or shortness of breath.   atorvastatin 10 MG tablet Commonly known as: LIPITOR Take 10 mg by mouth every Monday, Wednesday, and Friday at 8 PM.   bimatoprost 0.01 % Soln Commonly known as: LUMIGAN Place 1 drop into both eyes at bedtime.   BIOFREEZE ROLL-ON EX Apply 1 application   topically 2 (two) times daily as needed (back pain).   calcium carbonate 500 MG chewable tablet Commonly known as: TUMS - dosed in mg elemental calcium Chew 1 tablet by mouth daily as needed for indigestion.   escitalopram 10 MG tablet Commonly known as: LEXAPRO Take 10 mg by mouth daily.   fluticasone  furoate-vilanterol 100-25 MCG/ACT Aepb Commonly known as: Breo Ellipta  Inhale 1 puff into the lungs daily. What changed: when to take this   levETIRAcetam 500 MG tablet Commonly known as: KEPPRA Take 250 mg by mouth 2 (two) times daily.   levothyroxine 50 MCG tablet Commonly known as: SYNTHROID Take 1 tablet (50 mcg total) by mouth daily at 6 (six) AM. Start taking on: January 12, 2024   metoprolol  tartrate 25 MG tablet Commonly known as: LOPRESSOR  Take 1 tablet (25 mg total) by mouth 2 (two) times daily. What changed:  medication strength how much to take   omeprazole  40 MG capsule Commonly known as: PRILOSEC Take 1 capsule (40 mg total) by mouth 2 (two) times daily.   potassium chloride  SA 20 MEQ tablet Commonly known as: KLOR-CON  M TAKE ONE TABLET ( TOTAL) BY MOUTH DAILY What changed: See the new instructions.   timolol 0.5 % ophthalmic solution Commonly known as: TIMOPTIC Place 1 drop into both eyes daily.   torsemide  20 MG tablet Commonly known as: DEMADEX  Take 1 tablet (20 mg total) by mouth daily.   VITAMIN B-12 SL Place 1 tablet under the tongue daily.   vitamin C 1000 MG tablet Take 3,000 mg by mouth daily.   vitamin D3 25 MCG tablet Commonly known as: CHOLECALCIFEROL Take 1,000 Units by mouth daily.        Follow-up Information     Care, [facility] - Inpatient Follow up.   Specialty: Home Health Services Why: Physical Therapy-office to call with visit times. Contact information: [address] [place] [address] [phone]                Discharge Exam: Fredricka Weights   01/02/24 1532  Weight: 120.2 kg    General-appears in no acute distress Heart-S1-S2, regular, no murmur auscultated Lungs-clear to auscultation bilaterally, no wheezing or crackles auscultated Abdomen-soft, nontender, no organomegaly Extremities-no edema in the lower extremities Neuro-alert, oriented x3, no focal deficit noted  Condition at discharge: good  The results of significant diagnostics from this hospitalization (including imaging, microbiology, ancillary and laboratory) are listed below for reference.   Imaging Studies: DG Chest 2 View Result Date: 01/11/2024 EXAM: 2 VIEW(S) XRAY OF THE CHEST 01/11/2024 06:25:50 AM COMPARISON: 01/08/2024 CLINICAL HISTORY: Pacemaker [id number] FINDINGS: LINES, TUBES AND DEVICES: Interval placement of right-sided dual lead cardiac device. LUNGS AND PLEURA: Trace right pleural effusion. Slightly prominent interstitial opacities could represent mild pulmonary edema or bronchovascular crowding. No pneumothorax. HEART AND MEDIASTINUM: No acute abnormality of the cardiac and mediastinal silhouettes. BONES AND SOFT TISSUES: No acute osseous abnormality. IMPRESSION: 1. Interval placement of right-sided dual-lead cardiac device. 2.  Slight interstitial opacities that may reflect mild pulmonary edema versus bronchovascular crowding. 3. Trace right pleural effusion. 4. No pneumothorax Electronically signed by: Norleen Boxer MD 01/11/2024 10:34 AM EDT RP Workstation: [id number]   EP PPM/ICD IMPLANT Result Date: 01/10/2024  CONCLUSIONS:  1. Symptomatic bradycardia  2. Successful dual chamber permanent pacemaker implantation  3.  No early apparent complications.   DG Chest 2 View Result Date: 01/08/2024 EXAM: 2 VIEW(S) XRAY OF THE CHEST 01/08/2024 12:30:00 PM COMPARISON: Chest radiograph 01/02/2024. CLINICAL HISTORY: Bradycardia, hypoxia. FINDINGS: LUNGS AND PLEURA: Low lung volumes. Mild bilateral central perihilar prominence could reflect bronchovascular crowding secondary to hypoinflation or central  pulmonary vascular congestion. No focal pulmonary opacity. No pleural effusion. No pneumothorax. HEART AND MEDIASTINUM: No acute abnormality of the cardiac and mediastinal silhouettes. BONES AND SOFT TISSUES: No acute osseous abnormality. IMPRESSION: 1. Mild bilateral central perihilar prominence could reflect bronchovascular crowding secondary to hypoinflation or central pulmonary vascular congestion. Electronically signed by: Shahmeer Marcelino MD 01/08/2024 04:29 PM EDT RP Workstation: [id number]   ECHOCARDIOGRAM COMPLETE Result Date: 01/03/2024    ECHOCARDIOGRAM REPORT   Patient Name:   BRIT CARBONELL Date of Exam: 01/03/2024 Medical Rec #:  [id number]          Height:       63.0 in Accession #:    [id number]         Weight:       265.0 lb Date of Birth:  Aug 13, 1940          BSA:          2.179 m Patient Age:    83 years           BP:           125/61 mmHg Patient Gender: F                  HR:           45 bpm. Exam Location:  Inpatient Procedure: 2D Echo, Cardiac Doppler and Color Doppler (Both Spectral and Color            Flow Doppler were utilized during procedure). Indications:    Bradycardia  History:        Patient has prior history of Echocardiogram examinations, most                 recent 09/30/2019. TIA, Arrythmias:Bradycardia and Atrial                 Fibrillation, Signs/Symptoms:Hypotension and Dyspnea; Risk                 Factors:Hypertension and Former Smoker.  Sonographer:    Juliene Rucks Referring Phys: [phone] EJIROGHENE E EMOKPAE  Sonographer Comments: Patient is obese. Image acquisition challenging due to patient body habitus. IMPRESSIONS  1. Left ventricular ejection fraction, by estimation, is 60 to 65%. The left ventricle has normal function. The left ventricle has no regional wall motion abnormalities. There is mild concentric left ventricular hypertrophy. Left ventricular diastolic parameters are consistent with Grade III diastolic dysfunction (restrictive). Elevated left atrial  pressure.  2. Right ventricular systolic function is normal. The right ventricular size is normal.  3. Left atrial size was mildly dilated.  4. Right atrial size was mildly dilated.  5. The mitral valve is normal in structure. Mild mitral valve regurgitation. No evidence of mitral stenosis.  6. The aortic valve is normal in structure. Aortic valve regurgitation is not visualized. No aortic stenosis is present.  7. The inferior vena cava is normal in size with greater than 50% respiratory variability, suggesting right atrial pressure of 3 mmHg. FINDINGS  Left Ventricle: Left ventricular ejection fraction, by estimation, is 60 to 65%. The left ventricle has normal function. The left ventricle has no regional wall motion abnormalities. The left ventricular internal cavity size was normal in size. There is  mild concentric left ventricular hypertrophy. Left ventricular diastolic parameters are consistent with Grade III diastolic dysfunction (restrictive). Elevated left atrial pressure. Right Ventricle: The right ventricular size is normal. No increase in right ventricular wall thickness. Right ventricular systolic function is normal. Left Atrium: Left atrial size was mildly dilated. Right Atrium: Right atrial size was mildly dilated. Pericardium: There is no evidence of pericardial effusion. Presence of epicardial fat layer. Mitral Valve: The mitral valve is normal in structure. Mild to moderate mitral annular calcification. Mild mitral valve regurgitation. No evidence of mitral valve stenosis. Tricuspid Valve: The tricuspid valve is normal in structure. Tricuspid valve regurgitation is not demonstrated. No evidence of tricuspid stenosis. Aortic Valve: The aortic valve is normal in structure. Aortic valve regurgitation is not visualized. No aortic stenosis is present. Pulmonic Valve: The pulmonic valve was normal in structure. Pulmonic valve regurgitation is not visualized. No evidence of pulmonic stenosis. Aorta: The  aortic root and ascending aorta are structurally normal, with no evidence of dilitation. Venous: The inferior vena cava is normal in size with greater than 50% respiratory variability, suggesting right atrial pressure of 3 mmHg. IAS/Shunts: No atrial level shunt detected by color flow Doppler.  RIGHT VENTRICLE RV Basal diam:  3.70 cm RV Mid diam:    2.90 cm LEFT ATRIUM           Index        RIGHT ATRIUM           Index LA Vol (A2C): 84.0 ml 38.54 ml/m  RA Area:     23.70 cm LA Vol (A4C): 78.8 ml 36.16 ml/m  RA Volume:   72.30 ml  33.18 ml/m   AORTA Ao Root diam: 2.70 cm Ao Asc diam:  3.20 cm MR Peak grad: 75.3 mmHg MR Vmax:      434.00 cm/s Dub Tobb DO Electronically signed by Dub Huntsman DO Signature Date/Time: 01/03/2024/2:10:25 PM    Final    DG Chest Port 1 View Result Date: 01/02/2024 EXAM: 1 VIEW XRAY OF THE CHEST 01/02/2024 06:24:00 PM COMPARISON: Chest x-ray 6925. CLINICAL HISTORY: Symptomatic bradycardia. FINDINGS: LUNGS AND PLEURA: No focal pulmonary opacity. No pulmonary edema. No pleural effusion. No pneumothorax. HEART AND MEDIASTINUM: The heart is enlarged, unchanged. BONES AND SOFT TISSUES: No acute osseous abnormality. IMPRESSION: 1. No acute cardiopulmonary process. 2. Stable cardiomegaly. Electronically signed by: Greig Pique MD 01/02/2024 06:38 PM EDT RP Workstation: [id number]    Microbiology: Results for orders placed or performed during the hospital encounter of 01/02/24  Surgical PCR screen     Status: None   Collection Time: 01/06/24  2:26 PM   Specimen: Nasal Mucosa; Nasal Swab  Result Value Ref Range Status   MRSA, PCR NEGATIVE NEGATIVE Final   Staphylococcus aureus NEGATIVE NEGATIVE Final    Comment: (NOTE) The Xpert SA Assay (FDA approved for NASAL specimens in patients 15 years of age and older), is one component of a comprehensive surveillance program. It is not intended to diagnose infection nor to guide or monitor treatment. Performed at [facility], [address], [place], [place] [address]  Labs: CBC: Recent Labs  Lab 01/05/24 0450 01/06/24 0448 01/07/24 0423 01/08/24 0356 01/09/24 0437  WBC 4.9 5.4 5.1 4.8 4.5  HGB 9.8* 10.3* 9.8* 10.5* 9.4*  HCT 33.7* 36.0 33.5* 36.5 31.9*  MCV 80.6 81.8 79.6* 80.8 79.8*  PLT 163 169 165 143* 168   Basic Metabolic Panel: Recent Labs  Lab 01/05/24 0450 01/05/24 1219 01/06/24 0448 01/07/24 0423 01/08/24 0356 01/09/24 0437 01/10/24 0410  NA  --    < > 143 139 140 141 143  K  --    < > 4.0 4.0 4.8 3.9 3.7  CL  --    < > 101 98 103 101 98  CO2  --    < > 33* 31 28 30  33*  GLUCOSE  --    < > 114* 108* 120* 106* 99  BUN  --    < > 9 13 14 16 19   CREATININE  --    < > 1.10* 1.01* 1.08* 1.09* 1.31*  CALCIUM  --    < > 9.0 9.0 9.2 9.4 9.1  MG 2.3  --  2.2 2.1 2.2 2.1  --   PHOS  --   --   --   --  5.0*  --   --    < > = values in this interval not displayed.   Liver Function Tests: Recent Labs  Lab 01/04/24 1556 01/05/24 1219  AST 19 18  ALT 9 10  ALKPHOS 38 38  BILITOT 0.7 0.7  PROT 5.4* 6.3*  ALBUMIN 2.7* 3.3*   CBG: No results for input(s): GLUCAP in the last 168 hours.  Discharge time spent: greater than 30 minutes.  Signed: [name] Brod, MD [facility] 01/11/2024

## 2024-01-11 NOTE — Progress Notes (Incomplete)
 [facility]  PROGRESS NOTE  JAYLON GRODE FMW:[id number] DOB: 07-22-1940 DOA: 01/02/2024 PCP: Sheryle Carwin, MD   Brief HPI:   83 y.o. female with medical history significant for atrial fibrillation, hypertension, asthma, depression, OSA was sent from pulmonary office with complaints of low heart rate and low blood pressure.  Patient reported that over the last 2 months she had increasing fatigue, somnolence with difficulty breathing especially on exertion with chest tightness and near passing out spells.  Patient was on metoprolol  and amiodarone  at home.  She had seen her outpatient provider and amiodarone  and metoprolol  were held.  Patient was then admitted to the hospital for further evaluation and treatment. At this time, patient has been seen by electrophysiology cardiology and plans for permanent pacemaker placement.       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   Hypothyroidism - TSH markedly elevated at 78.2 with low T3 and free T4 - Started on IV Synthroid 75 mcg daily - Continue to hold amiodarone  - Will need Synthroid at discharge  Acute kidney injury - Initial creatinine of 1.3 - Torsemide  20 mg daily restarted  Atrial fibrillation - Presented with marked sinus bradycardia - Metoprolol  and amiodarone  on hold - No anticoagulation due to history of GI bleed - Patient is not a candidate for ablation due to age and obesity - Plan for pacemaker placement as above  Hypertension - Antihypertensive including losartan , metoprolol , on hold due to low blood pressure - Blood pressure is stable at this time  Seizure disorder - Continue Keppra  Chronic diastolic CHF -  Torsemide  restarted at 20 mg daily  Obstructive sleep apnea - Uses CPAP at bedtime      DVT prophylaxis: SCDs  Medications     fluticasone   2 spray Each Nare Daily   fluticasone  furoate-vilanterol  1 puff Inhalation QPM   levETIRAcetam  250 mg Oral BID   levothyroxine  75 mcg Intravenous Daily   sodium chloride  flush  3 mL Intravenous Q12H   sodium chloride  flush  3 mL Intravenous Q12H   torsemide   20 mg Oral Daily     Data Reviewed:   CBG:  No results for input(s): GLUCAP in the last 168 hours.  SpO2: 98 % O2 Flow Rate (L/min): 2 L/min FiO2 (%): 21 %    Vitals:   01/10/24 1500 01/10/24 2054 01/11/24 0027 01/11/24 0451  BP: (!) 132/59 (!) 150/70 (!) 152/79 (!) 148/65  Pulse: (!) 59 (!) 59 (!) 59 64  Resp:  18 20 18   Temp: 98.4 F (36.9 C) 98.4 F (36.9 C) 98.2 F (36.8 C) 98.6 F (37 C)  TempSrc: Oral Oral Oral Oral  SpO2:  96% 99% 98%  Weight:      Height:          Data Reviewed:  Basic Metabolic Panel: Recent Labs  Lab 01/05/24 0450 01/05/24 1219 01/06/24 0448 01/07/24 0423 01/08/24 0356 01/09/24 0437 01/10/24 0410  NA  --    < > 143 139 140 141 143  K  --    < > 4.0 4.0 4.8 3.9 3.7  CL  --    < > 101 98 103 101 98  CO2  --    < > 33* 31  28 30 33*  GLUCOSE  --    < > 114* 108* 120* 106* 99  BUN  --    < > 9 13 14 16 19   CREATININE  --    < > 1.10* 1.01* 1.08* 1.09* 1.31*  CALCIUM  --    < > 9.0 9.0 9.2 9.4 9.1  MG 2.3  --  2.2 2.1 2.2 2.1  --   PHOS  --   --   --   --  5.0*  --   --    < > = values in this interval not displayed.    CBC: Recent Labs  Lab 01/05/24 0450 01/06/24 0448 01/07/24 0423 01/08/24 0356 01/09/24 0437  WBC 4.9 5.4 5.1 4.8 4.5  HGB 9.8* 10.3* 9.8* 10.5* 9.4*  HCT 33.7* 36.0 33.5* 36.5 31.9*  MCV 80.6 81.8 79.6* 80.8 79.8*  PLT 163 169 165 143* 168    LFT Recent Labs  Lab 01/04/24 1556 01/05/24 1219  AST 19 18  ALT 9 10  ALKPHOS 38 38  BILITOT 0.7 0.7  PROT 5.4* 6.3*  ALBUMIN 2.7* 3.3*      Antibiotics: Anti-infectives (From admission, onward)    Start     Dose/Rate Route Frequency Ordered Stop   01/10/24 1058  sodium chloride  0.9 % with gentamicin  (GARAMYCIN ) ADS Med       Note to Pharmacy: Rhetta Benders T: cabinet override      01/10/24 1058 01/10/24 2259   01/10/24 0800  gentamicin  (GARAMYCIN ) 80 mg in sodium chloride  0.9 % 500 mL irrigation        80 mg Irrigation On call 01/10/24 0121 01/10/24 1223   01/10/24 0800  ceFAZolin  (ANCEF ) IVPB 3g/150 mL premix        3 g 300 mL/hr over 30 Minutes Intravenous On call 01/10/24 0121 01/10/24 1213   01/08/24 1030  ceFAZolin  (ANCEF ) IVPB 3g/150 mL premix        3 g 300 mL/hr over 30 Minutes Intravenous On call 01/08/24 0942 01/09/24 1030   01/08/24 0937  sodium chloride  0.9 % with gentamicin  (GARAMYCIN ) ADS Med  Status:  Discontinued       Note to Pharmacy: Wilkie Pa G: cabinet override      01/08/24 9062 01/08/24 0947   01/07/24 0845  gentamicin  (GARAMYCIN ) 80 mg in sodium chloride  0.9 % 500 mL irrigation        80 mg Irrigation On call 01/07/24 0749 01/08/24 0845   01/07/24 0845  ceFAZolin  (ANCEF ) IVPB 3g/150 mL premix        3 g 300 mL/hr over 30 Minutes Intravenous On call 01/07/24 0749 01/08/24 0845        CONSULTS cardiology  Code Status: Full code  Family Communication: Discussed with family at bedside     Subjective      Objective    Physical Examination:     Status is: Inpatient:             [name] Brod   [facility] If 7PM-7AM, please contact night-coverage at [email or web address], Office  [phone]   01/11/2024, 9:02 AM  LOS: 6 days

## 2024-01-11 NOTE — Discharge Instructions (Signed)
 After Your Pacemaker   You have a Medtronic Pacemaker  If you have a Medtronic or Biotronik device, plug in your home monitor once you get home, and no manual interaction is required.   If you have an [employer] or [employer] device, plug your home monitor once you get home, sit near the device, and press the large activation button. Sit nearby until the process is complete, usually notated by lights on the monitor.   If you were set up for monitoring using an app on your phone, make sure the app remains open in the background and the Bluetooth remains on.  ACTIVITY Do not lift your arm above shoulder height for 1 week after your procedure. After 7 days, you may progress as below.  You should remove your sling 24 hours after your procedure, unless otherwise instructed by your provider.     Friday January 18, 2024  Saturday January 19, 2024 Sunday January 20, 2024 Monday January 21, 2024   Do not lift, push, pull, or carry anything over 10 pounds with the affected arm until 6 weeks (Friday February 22, 2024 ) after your procedure.   You may drive AFTER your wound check, unless you have been told otherwise by your provider.   Ask your healthcare provider when you can go back to work   INCISION/Dressing If you are on a blood thinner such as Coumadin, Xarelto, Eliquis , Plavix, or Pradaxa please confirm with your provider when this should be resumed.   If large square, outer bandage is left in place, this can be removed after 24 hours from your procedure. Do not remove steri-strips or glue as below.   If a PRESSURE DRESSING (a bulky dressing that usually goes up over your shoulder) was applied or left in place, please follow instructions given by your provider on when to return to have this removed.   Monitor your Pacemaker site for redness, swelling, and drainage. Call the device clinic at [phone] if you experience these symptoms or fever/chills.  If your incision is sealed with  Steri-strips or staples, you may shower 7 days after your procedure or when told by your provider. Do not remove the steri-strips or let the shower hit directly on your site. You may wash around your site with soap and water .    If you were discharged in a sling, please do not wear this during the day more than 48 hours after your surgery unless otherwise instructed. This may increase the risk of stiffness and soreness in your shoulder.   Avoid lotions, ointments, or perfumes over your incision until it is well-healed.  You may use a hot tub or a pool AFTER your wound check appointment if the incision is completely closed.  Pacemaker Alerts:  Some alerts are vibratory and others beep. These are NOT emergencies. Please call our office to let us  know. If this occurs at night or on weekends, it can wait until the next business day. Send a remote transmission.  If your device is capable of reading fluid status (for heart failure), you will be offered monthly monitoring to review this with you.   DEVICE MANAGEMENT Remote monitoring is used to monitor your pacemaker from home. This monitoring is scheduled every 91 days by our office. It allows us  to keep an eye on the functioning of your device to ensure it is working properly. You will routinely see your Electrophysiologist annually (more often if necessary).  This will appear as a REMOTE check on your  MyChart schedule. These are automatic and there is nothing for you to manually do unless otherwise instructed.  You should receive your ID card for your new device in 4-8 weeks. Keep this card with you at all times once received. Consider wearing a medical alert bracelet or necklace.  Your Pacemaker may be MRI compatible. This will be discussed at your next office visit/wound check.  You should avoid contact with strong electric or magnetic fields.   Do not use amateur (ham) radio equipment or electric (arc) welding torches. MP3 player headphones with  magnets should not be used. Some devices are safe to use if held at least 12 inches (30 cm) from your Pacemaker. These include power tools, lawn mowers, and speakers. If you are unsure if something is safe to use, ask your health care provider.  When using your cell phone, hold it to the ear that is on the opposite side from the Pacemaker. Do not leave your cell phone in a pocket over the Pacemaker.  You may safely use electric blankets, heating pads, computers, and microwave ovens.  Call the office right away if: You have chest pain. You feel more short of breath than you have felt before. You feel more light-headed than you have felt before. Your incision starts to open up.  This information is not intended to replace advice given to you by your health care provider. Make sure you discuss any questions you have with your health care provider.

## 2024-01-11 NOTE — TOC Transition Note (Signed)
 Transition of Care [facility]) - Discharge Note   Patient Details  Name: Kim Dixon MRN: [id number] Date of Birth: 1940-10-07  [facility] of [facility] CM/SW Contact:  Sudie Erminio Deems, RN Phone Number: 01/11/2024, 12:28 PM   Clinical Narrative:  Plan for patient to discharge home today. [name] is aware that patient will discharge today. No further needs identified at this time.      Final next level of care: Home w Home Health Services Barriers to Discharge: No Barriers Identified   Patient Goals and CMS Choice Patient states their goals for this hospitalization and ongoing recovery are:: Wants to return home.   Choice offered to / list presented to : Patient (Patient asked for [facility])      Discharge Placement                       Discharge Plan and Services Additional resources added to the After Visit Summary for   In-house Referral: NA Discharge Planning Services: CM Consult Post Acute Care Choice: Home Health                    HH Arranged: PT [facility] Agency: [facility] Health Care Date [facility] Agency Contacted: 01/04/24 Time HH Agency Contacted: [phone] Representative spoke with at [facility] Agency: Darleene  Social Drivers of Health (SDOH) Interventions SDOH Screenings   Food Insecurity: No Food Insecurity (01/02/2024)  Housing: Low Risk  (01/02/2024)  Transportation Needs: No Transportation Needs (01/02/2024)  Utilities: Not At Risk (01/02/2024)  Social Connections: Moderately Integrated (01/02/2024)  Tobacco Use: Medium Risk (01/02/2024)     Readmission Risk Interventions     No data to display

## 2024-01-11 NOTE — Plan of Care (Signed)
  Problem: Education: Goal: Knowledge of General Education information will improve Description: Including pain rating scale, medication(s)/side effects and non-pharmacologic comfort measures Outcome: Adequate for Discharge   Problem: Health Behavior/Discharge Planning: Goal: Ability to manage health-related needs will improve Outcome: Adequate for Discharge   Problem: Clinical Measurements: Goal: Ability to maintain clinical measurements within normal limits will improve Outcome: Adequate for Discharge Goal: Will remain free from infection Outcome: Adequate for Discharge Goal: Diagnostic test results will improve Outcome: Adequate for Discharge Goal: Respiratory complications will improve Outcome: Adequate for Discharge Goal: Cardiovascular complication will be avoided Outcome: Adequate for Discharge   Problem: Activity: Goal: Risk for activity intolerance will decrease Outcome: Adequate for Discharge   Problem: Nutrition: Goal: Adequate nutrition will be maintained Outcome: Adequate for Discharge   Problem: Coping: Goal: Level of anxiety will decrease Outcome: Adequate for Discharge   Problem: Elimination: Goal: Will not experience complications related to bowel motility Outcome: Adequate for Discharge Goal: Will not experience complications related to urinary retention Outcome: Adequate for Discharge   Problem: Pain Managment: Goal: General experience of comfort will improve and/or be controlled Outcome: Adequate for Discharge   Problem: Safety: Goal: Ability to remain free from injury will improve Outcome: Adequate for Discharge   Problem: Skin Integrity: Goal: Risk for impaired skin integrity will decrease Outcome: Adequate for Discharge   Problem: Education: Goal: Knowledge of cardiac device and self-care will improve Outcome: Adequate for Discharge Goal: Ability to safely manage health related needs after discharge will improve Outcome: Adequate for  Discharge Goal: Individualized Educational Video(s) Outcome: Adequate for Discharge   Problem: Cardiac: Goal: Ability to achieve and maintain adequate cardiopulmonary perfusion will improve Outcome: Adequate for Discharge

## 2024-01-11 NOTE — Progress Notes (Signed)
 Physical Therapy Treatment Patient Details Name: Kim Dixon MRN: [id number] DOB: September 26, 1940 Today's Date: 01/11/2024   History of Present Illness Pt is an 83 y/o female admitted 10/15 for low HR and blood pressure. 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 PMHx: Glaucoma, OSA, PAF, SCC    PT Comments  Pt progressing well, has met goal.  Completed education on the pacer precautions and progression of activity once allowed to progress.  Emphasis on gait stability/stamina and safe negotiation of stairs.     If plan is discharge home, recommend the following:  (PRN assist)   Can travel by private vehicle        Equipment Recommendations  None recommended by PT    Recommendations for Other Services       Precautions / Restrictions Precautions Precautions:  (lower fall risk) Recall of Precautions/Restrictions: Intact Precaution/Restrictions Comments: pacer precautions, hard copy of mobility progression for pacemaker given     Mobility  Bed Mobility   Bed Mobility: Supine to Sit     Supine to sit: Supervision     General bed mobility comments: up via L elbow from relatively flat bed, no use of the R UE and no assist needs    Transfers Overall transfer level: Needs assistance Equipment used: None Transfers: Sit to/from Stand Sit to Stand: Supervision           General transfer comment: use of UE to power up, stable    Ambulation/Gait Ambulation/Gait assistance: Supervision Gait Distance (Feet): 250 Feet Assistive device: None Gait Pattern/deviations: Step-through pattern   Gait velocity interpretation: <1.8 ft/sec, indicate of risk for recurrent falls   General Gait Details: stable wobble gait, slower cadence.  mild SOB, pt can continue a full conversation.   VSS with good activity tolerance   Stairs Stairs: Yes Stairs assistance: Supervision Stair Management: One rail Left, Step to pattern,  Forwards Number of Stairs: 3 General stair comments: safe with the rail   Wheelchair Mobility     Tilt Bed    Modified Rankin (Stroke Patients Only)       Balance     Sitting balance-Leahy Scale: Good       Standing balance-Leahy Scale: Fair                              Hotel manager: No apparent difficulties  Cognition Arousal: Alert Behavior During Therapy: WFL for tasks assessed/performed   PT - Cognitive impairments: No apparent impairments                       PT - Cognition Comments: WFL in session, not formally assessed Following commands: Intact      Cueing Cueing Techniques: Verbal cues  Exercises      General Comments General comments (skin integrity, edema, etc.): verbalized the Pacemaker precautions.      Pertinent Vitals/Pain Pain Assessment Pain Assessment: Faces Faces Pain Scale: Hurts a little bit Pain Location: R pacer incision. Pain Descriptors / Indicators: Discomfort Pain Intervention(s): Monitored during session    Home Living                          Prior Function            PT Goals (current goals can now be found in the care plan section) Acute Rehab PT Goals PT Goal Formulation: With patient  Time For Goal Achievement: 01/11/24 Potential to Achieve Goals: Good Progress towards PT goals: Progressing toward goals    Frequency    Min 1X/week      PT Plan      Co-evaluation              AM-PAC PT 6 Clicks Mobility   Outcome Measure  Help needed turning from your back to your side while in a flat bed without using bedrails?: A Little Help needed moving from lying on your back to sitting on the side of a flat bed without using bedrails?: A Little Help needed moving to and from a bed to a chair (including a wheelchair)?: A Little Help needed standing up from a chair using your arms (e.g., wheelchair or bedside chair)?: A Little Help needed to  walk in hospital room?: A Little Help needed climbing 3-5 steps with a railing? : A Little 6 Click Score: 18    End of Session   Activity Tolerance: Patient tolerated treatment well Patient left: in bed;with call bell/phone within reach;with family/visitor present (sitting EOB) Nurse Communication: Mobility status PT Visit Diagnosis: Other abnormalities of gait and mobility (R26.89)     Time: 8957-8894 PT Time Calculation (min) (ACUTE ONLY): 23 min  Charges:    $Gait Training: 8-22 mins $Therapeutic Activity: 8-22 mins PT General Charges $$ ACUTE PT VISIT: 1 Visit                     01/11/2024  [place] HERO., PT Acute Rehabilitation Services [phone]  (office)   Vinie GAILS Hedi Barkan 01/11/2024, 11:29 AM

## 2024-01-11 NOTE — Care Management Important Message (Signed)
 Important Message  Patient Details  Name: TISHINA LOWN MRN: [id number] Date of Birth: 1940/12/28   Important Message Given:  Yes - Medicare IM     Vonzell Arrie Sharps 01/11/2024, 12:59 PM

## 2024-01-14 ENCOUNTER — Telehealth: Payer: Self-pay

## 2024-01-14 ENCOUNTER — Encounter: Payer: Self-pay | Admitting: Emergency Medicine

## 2024-01-14 NOTE — Telephone Encounter (Signed)
 Follow-up after same day discharge: Implant date: 01/10/2024 MD: Cindie Device: PPM MDT  Location: L Chest    Wound check visit: 01/23/2024 90 day MD follow-up: 04/29/2024  Remote Transmission received:yes  Dressing/sling removed: n/a  Confirm OAC restart on: yes  Please continue to monitor your cardiac device site for redness, swelling, and drainage. Call the device clinic at [phone] if you experience these symptoms, fever/chills, or have questions about your device.   Remote monitoring is used to monitor your cardiac device from home. This monitoring is scheduled every 91 days by our office. It allows us  to keep an eye on the functioning of your device to ensure it is working properly.

## 2024-01-14 NOTE — Transitions of Care (Post Inpatient/ED Visit) (Signed)
 01/14/2024  Name: Kim Dixon MRN: [id number] DOB: Feb 23, 1941  Today's TOC FU Call Status: Today's TOC FU Call Status:: Successful TOC FU Call Completed TOC FU Call Complete Date: 01/14/24 Patient's Name and Date of Birth confirmed.  Transition Care Management Follow-up Telephone Call Date of Discharge: 01/11/24 Discharge Facility: Kim Dixon [facility]) Type of Discharge: Inpatient Admission Primary Inpatient Discharge Diagnosis:: Symptomatic Bradycardia How have you been since you were released from the hospital?: Better Any questions or concerns?: No  Items Reviewed: Did you receive and understand the discharge instructions provided?: Yes Medications obtained,verified, and reconciled?: Yes (Medications Reviewed) Any new allergies since your discharge?: No Dietary orders reviewed?: Yes Type of Diet Ordered:: Heart Healthy low sodium Do you have support at home?: Yes People in Home [RPT]: child(ren), adult Name of Support/Comfort Primary Source: Kim Dixon  Medications Reviewed Today: Medications Reviewed Today     Reviewed by Kim Ivanoff, RN (Case Manager) on 01/14/24 at 1033  Med List Status: <None>   Medication Order Taking? Sig Documenting Provider Last Dose Status Informant  acetaminophen  (TYLENOL ) 500 MG tablet [id number] Yes Take 1,000 mg by mouth every 6 (six) hours as needed for mild pain (pain score 1-3). Provider, Historical, Dixon  Active Self, Pharmacy Records  albuterol  (VENTOLIN  HFA) 108 [phone] Base) MCG/ACT inhaler [id number] Yes Inhale 2 puffs into the lungs every 6 (six) hours as needed for wheezing or shortness of breath. Kim Oh, Dixon  Active Self, Pharmacy Records  Ascorbic Acid (VITAMIN C) 1000 MG tablet [id number] Yes Take 3,000 mg by mouth daily.  Provider, Historical, Dixon  Active Self, Pharmacy Records           Med Note [facility], Kim Dixon Mar 17, 2019  4:02 PM)    atorvastatin (LIPITOR) 10 MG tablet [id number] Yes Take 10 mg by mouth every Monday,  Wednesday, and Friday at 8 PM.  Provider, Historical, Dixon  Active Self, Pharmacy Records           Med Note Kim Dixon   Fri Jul 05, 2018  9:32 AM)    bimatoprost (LUMIGAN) 0.01 % SOLN [id number] Yes Place 1 drop into both eyes at bedtime. Provider, Historical, Dixon  Active Self, Pharmacy Records  calcium carbonate (TUMS - DOSED IN MG ELEMENTAL CALCIUM) 500 MG chewable tablet [id number] Yes Chew 1 tablet by mouth daily as needed for indigestion. Provider, Historical, Dixon  Active Self, Pharmacy Records  Cyanocobalamin  (VITAMIN B-12 SL) [id number] Yes Place 1 tablet under the tongue daily. Provider, Historical, Dixon  Active Self, Pharmacy Records  escitalopram (LEXAPRO) 10 MG tablet [id number] Yes Take 10 mg by mouth daily. Provider, Historical, Dixon  Active Self, Pharmacy Records  fluticasone  furoate-vilanterol (BREO ELLIPTA ) 100-25 MCG/ACT AEPB [id number] Yes Inhale 1 puff into the lungs daily.  Patient taking differently: Inhale 1 puff into the lungs every evening.   Kim Oh, Dixon  Active Self, Pharmacy Records  levETIRAcetam (KEPPRA) 500 MG tablet [id number] Yes Take 250 mg by mouth 2 (two) times daily. Provider, Historical, Dixon  Active Self, Pharmacy Records  levothyroxine (SYNTHROID) 50 MCG tablet [id number] Yes Take 1 tablet (50 mcg total) by mouth daily at 6 (six) AM. Kim Sabas RAMAN, Dixon  Active   Menthol , Topical Analgesic, (BIOFREEZE ROLL-ON EX) [id number] Yes Apply 1 application  topically 2 (two) times daily as needed (back pain). Provider, Historical, Dixon  Active Self, Pharmacy Records  metoprolol  tartrate (LOPRESSOR ) 25 MG tablet [id number] Yes Take 1 tablet (25 mg total) by  mouth 2 (two) times daily. Kim Sabas RAMAN, Dixon  Active   omeprazole  (PRILOSEC) 40 MG capsule [id number] Yes Take 1 capsule (40 mg total) by mouth 2 (two) times daily. Kim Dixon, Kim Dixon  Active Self, Pharmacy Records           Med Note [place], CHUCK Dixon [place] Jan 02, 2024  5:26 PM) Pt needs refill  potassium  chloride SA (KLOR-CON  M) 20 MEQ tablet [id number] Yes TAKE ONE TABLET ( TOTAL) BY MOUTH DAILY  Patient taking differently: Take 20 mEq by mouth daily.   Kim Jayson KANDICE, Dixon  Active Self, Pharmacy Records  sodium chloride  flush (NS) 0.9 % injection 3 mL [id number]   Kim Jayson KANDICE, Dixon  Active   timolol (TIMOPTIC) 0.5 % ophthalmic solution [id number] Yes Place 1 drop into both eyes daily. Provider, Historical, Dixon  Active Self, Pharmacy Records  torsemide  (DEMADEX ) 20 MG tablet [id number] Yes Take 1 tablet (20 mg total) by mouth daily. Johnson Laymon HERO, PA-C  Active Self, Pharmacy Records  Vitamin D3 (VITAMIN D) 25 MCG tablet [id number] Yes Take 1,000 Units by mouth daily. Provider, Historical, Dixon  Active Self, Pharmacy Records            Home Care and Equipment/Supplies: Were Home Health Services Ordered?: Yes Name of Home Health Agency:: [facility] Has Agency set up Dixon time to come to your home?: No EMR reviewed for Home Health Orders: Orders present/patient has not received call (refer to CM for follow-up) (Patient states she was told they would call today.  Advised to call agnecy if no call today.)  Functional Questionnaire: Do you need assistance with bathing/showering or dressing?: No Do you need assistance with meal preparation?: No Do you need assistance with eating?: No Do you have difficulty maintaining continence: No Do you need assistance with getting out of bed/getting out of Dixon chair/moving?: No Do you have difficulty managing or taking your medications?: No  Follow up appointments reviewed: PCP Follow-up appointment confirmed?: Yes Date of PCP follow-up appointment?: 01/15/24 Follow-up Provider: Dr. Sheryle Specialist [facility] Follow-up appointment confirmed?: Yes Date of Specialist follow-up appointment?: 01/23/24 Follow-Up Specialty Provider:: Heart Care for Pacer check Do you need transportation to your follow-up appointment?: No Do you understand care options if  your condition(s) worsen?: Yes-patient verbalized understanding  SDOH Interventions Today    Flowsheet Row Most Recent Value  SDOH Interventions   Food Insecurity Interventions Intervention Not Indicated  Housing Interventions Intervention Not Indicated  Transportation Interventions Intervention Not Indicated  Utilities Interventions Intervention Not Indicated    Marykate Heuberger J. Hanz Winterhalter RN, MSN [facility]  [facility], [facility] RN Care Manager Direct Dial: [phone]  Fax: [phone] Website: [email or web address]

## 2024-01-14 NOTE — Patient Instructions (Signed)
 Visit Information  Thank you for taking time to visit with me today.   Monitor site for infection Consider a mdeical ID bracelet Contact physician for questions or concerns.    Patient verbalizes understanding of instructions and care plan provided today and agrees to view in MyChart. Active MyChart status and patient understanding of how to access instructions and care plan via MyChart confirmed with patient.     The patient has been provided with contact information for the care management team and has been advised to call with any health related questions or concerns.   Please call the care guide team at [phone] if you need to cancel or reschedule your appointment.   Please call the Suicide and Crisis Lifeline: 988 if you are experiencing a Mental Health or Behavioral Health Crisis or need someone to talk to.  Cristoval Teall J. Debie Ashline RN, MSN [facility], [facility] Health RN Care Manager Direct Dial: [phone]  Fax: [phone] Website: [email or web address]

## 2024-01-15 DIAGNOSIS — E030 Congenital hypothyroidism with diffuse goiter: Secondary | ICD-10-CM | POA: Diagnosis not present

## 2024-01-15 DIAGNOSIS — I959 Hypotension, unspecified: Secondary | ICD-10-CM | POA: Diagnosis not present

## 2024-01-15 DIAGNOSIS — I5032 Chronic diastolic (congestive) heart failure: Secondary | ICD-10-CM | POA: Diagnosis not present

## 2024-01-15 DIAGNOSIS — N179 Acute kidney failure, unspecified: Secondary | ICD-10-CM | POA: Diagnosis not present

## 2024-01-15 DIAGNOSIS — I495 Sick sinus syndrome: Secondary | ICD-10-CM | POA: Diagnosis not present

## 2024-01-15 DIAGNOSIS — I8390 Asymptomatic varicose veins of unspecified lower extremity: Secondary | ICD-10-CM | POA: Diagnosis not present

## 2024-01-15 DIAGNOSIS — Z48812 Encounter for surgical aftercare following surgery on the circulatory system: Secondary | ICD-10-CM | POA: Diagnosis not present

## 2024-01-15 DIAGNOSIS — R001 Bradycardia, unspecified: Secondary | ICD-10-CM | POA: Diagnosis not present

## 2024-01-15 DIAGNOSIS — I480 Paroxysmal atrial fibrillation: Secondary | ICD-10-CM | POA: Diagnosis not present

## 2024-01-15 DIAGNOSIS — I110 Hypertensive heart disease with heart failure: Secondary | ICD-10-CM | POA: Diagnosis not present

## 2024-01-15 DIAGNOSIS — I4821 Permanent atrial fibrillation: Secondary | ICD-10-CM | POA: Diagnosis not present

## 2024-01-15 DIAGNOSIS — Z950 Presence of cardiac pacemaker: Secondary | ICD-10-CM | POA: Diagnosis not present

## 2024-01-18 DIAGNOSIS — I495 Sick sinus syndrome: Secondary | ICD-10-CM | POA: Diagnosis not present

## 2024-01-18 DIAGNOSIS — Z950 Presence of cardiac pacemaker: Secondary | ICD-10-CM | POA: Diagnosis not present

## 2024-01-18 DIAGNOSIS — N179 Acute kidney failure, unspecified: Secondary | ICD-10-CM | POA: Diagnosis not present

## 2024-01-18 DIAGNOSIS — I8390 Asymptomatic varicose veins of unspecified lower extremity: Secondary | ICD-10-CM | POA: Diagnosis not present

## 2024-01-18 DIAGNOSIS — Z48812 Encounter for surgical aftercare following surgery on the circulatory system: Secondary | ICD-10-CM | POA: Diagnosis not present

## 2024-01-18 DIAGNOSIS — I959 Hypotension, unspecified: Secondary | ICD-10-CM | POA: Diagnosis not present

## 2024-01-18 DIAGNOSIS — I110 Hypertensive heart disease with heart failure: Secondary | ICD-10-CM | POA: Diagnosis not present

## 2024-01-18 DIAGNOSIS — I480 Paroxysmal atrial fibrillation: Secondary | ICD-10-CM | POA: Diagnosis not present

## 2024-01-21 DIAGNOSIS — Z950 Presence of cardiac pacemaker: Secondary | ICD-10-CM | POA: Diagnosis not present

## 2024-01-21 DIAGNOSIS — I480 Paroxysmal atrial fibrillation: Secondary | ICD-10-CM | POA: Diagnosis not present

## 2024-01-21 DIAGNOSIS — I495 Sick sinus syndrome: Secondary | ICD-10-CM | POA: Diagnosis not present

## 2024-01-21 DIAGNOSIS — N179 Acute kidney failure, unspecified: Secondary | ICD-10-CM | POA: Diagnosis not present

## 2024-01-21 DIAGNOSIS — I110 Hypertensive heart disease with heart failure: Secondary | ICD-10-CM | POA: Diagnosis not present

## 2024-01-21 DIAGNOSIS — I959 Hypotension, unspecified: Secondary | ICD-10-CM | POA: Diagnosis not present

## 2024-01-21 DIAGNOSIS — Z48812 Encounter for surgical aftercare following surgery on the circulatory system: Secondary | ICD-10-CM | POA: Diagnosis not present

## 2024-01-21 DIAGNOSIS — I5032 Chronic diastolic (congestive) heart failure: Secondary | ICD-10-CM | POA: Diagnosis not present

## 2024-01-21 DIAGNOSIS — I8390 Asymptomatic varicose veins of unspecified lower extremity: Secondary | ICD-10-CM | POA: Diagnosis not present

## 2024-01-23 ENCOUNTER — Telehealth: Payer: Self-pay

## 2024-01-23 ENCOUNTER — Ambulatory Visit: Attending: Cardiology

## 2024-01-23 ENCOUNTER — Ambulatory Visit: Payer: Self-pay | Admitting: Cardiology

## 2024-01-23 DIAGNOSIS — I495 Sick sinus syndrome: Secondary | ICD-10-CM

## 2024-01-23 LAB — CUP PACEART INCLINIC DEVICE CHECK
Date Time Interrogation Session: 20251105131232
Implantable Lead Connection Status: 753985
Implantable Lead Connection Status: 753985
Implantable Lead Implant Date: 20251023
Implantable Lead Implant Date: 20251023
Implantable Lead Location: 753859
Implantable Lead Location: 753860
Implantable Lead Model: 5076
Implantable Lead Model: 5076
Implantable Pulse Generator Implant Date: 20251023

## 2024-01-23 NOTE — Patient Instructions (Signed)
  After Your Pacemaker   Monitor your pacemaker site for redness, swelling, and drainage. Call the device clinic at [phone] if you experience these symptoms or fever/chills.  Your incision was closed with Steri-strips or staples:  You may shower 7 days after your procedure and wash your incision with soap and water . Avoid lotions, ointments, or perfumes over your incision until it is well-healed.  You may use a hot tub or a pool after your wound check appointment if the incision is completely closed.  Do not lift, push or pull greater than 10 pounds with the affected arm until DECEMBER 4th. There are no other restrictions in arm movement after your wound check appointment.  You may drive, unless driving has been restricted by your healthcare providers.  Remote monitoring is used to monitor your pacemaker from home. This monitoring is scheduled every 91 days by our office. It allows us  to keep an eye on the functioning of your device to ensure it is working properly. You will routinely see your Electrophysiologist annually (more often if necessary).

## 2024-01-23 NOTE — Progress Notes (Signed)
 Normal dual chamber pacemaker wound check. Presenting rhythm: AF RVR. Wound well healed. Routine testing performed. Thresholds, sensing, and impedance consistent with implant measurements and at 3.5V safety margin/auto capture until 3 month visit. AF episodes noted longest duration 17 hrs 18 minutes. AT/AF burden 21.7%. Poor ventricular rate control. OAC temporarily held waiting for GI workup d/t previous bleeding issues. Reviewed arm restrictions to continue for 6 weeks total post op.  Pt enrolled in remote follow-up.  Will reach out to Dr. Almetta regarding AF burden and options moving forward for rate control. Will continue to monitor and update accordingly.   Patient request to establish w/ Dr. Almetta and be seen in [place] for future appointments.

## 2024-01-23 NOTE — Telephone Encounter (Signed)
 Patient seen in-clinic today for 10-14 day PPM Wound Check. Patient presenting AF RVR (AF/VS 90-120). Current AT/AF burden 21.7%. Poor ventricular rate control.  Dr. Cindie is implanting physician. Patient request to establish w/ Dr. Almetta and be seen in [place] for future appointments.   Symptomatic AF RVR events w/ dizziness/weakness/fatigue.  Medications: Amiodarone  discontinued during recent hospitalization. Started on Lopressor  25mg  BID. Eliquis  temporarily held waiting for GI workup d/t previous bleeding issues. (Note: Patient has hx hypotension).  Will forward to provider for awareness and recommendations for rate control.

## 2024-01-24 NOTE — Telephone Encounter (Signed)
 Reviewed with Dr. Almetta 11/5/225.  He is planning on calling patient directly to discuss options moving forward.

## 2024-01-25 DIAGNOSIS — I959 Hypotension, unspecified: Secondary | ICD-10-CM | POA: Diagnosis not present

## 2024-01-25 DIAGNOSIS — I480 Paroxysmal atrial fibrillation: Secondary | ICD-10-CM | POA: Diagnosis not present

## 2024-01-25 DIAGNOSIS — I8390 Asymptomatic varicose veins of unspecified lower extremity: Secondary | ICD-10-CM | POA: Diagnosis not present

## 2024-01-25 DIAGNOSIS — Z48812 Encounter for surgical aftercare following surgery on the circulatory system: Secondary | ICD-10-CM | POA: Diagnosis not present

## 2024-01-25 DIAGNOSIS — Z950 Presence of cardiac pacemaker: Secondary | ICD-10-CM | POA: Diagnosis not present

## 2024-01-25 DIAGNOSIS — I495 Sick sinus syndrome: Secondary | ICD-10-CM | POA: Diagnosis not present

## 2024-01-25 DIAGNOSIS — N179 Acute kidney failure, unspecified: Secondary | ICD-10-CM | POA: Diagnosis not present

## 2024-01-25 DIAGNOSIS — I110 Hypertensive heart disease with heart failure: Secondary | ICD-10-CM | POA: Diagnosis not present

## 2024-01-25 DIAGNOSIS — I5032 Chronic diastolic (congestive) heart failure: Secondary | ICD-10-CM | POA: Diagnosis not present

## 2024-01-29 ENCOUNTER — Telehealth: Payer: Self-pay | Admitting: Cardiology

## 2024-01-29 DIAGNOSIS — N179 Acute kidney failure, unspecified: Secondary | ICD-10-CM | POA: Diagnosis not present

## 2024-01-29 DIAGNOSIS — I480 Paroxysmal atrial fibrillation: Secondary | ICD-10-CM | POA: Diagnosis not present

## 2024-01-29 DIAGNOSIS — I495 Sick sinus syndrome: Secondary | ICD-10-CM | POA: Diagnosis not present

## 2024-01-29 DIAGNOSIS — I959 Hypotension, unspecified: Secondary | ICD-10-CM | POA: Diagnosis not present

## 2024-01-29 DIAGNOSIS — I110 Hypertensive heart disease with heart failure: Secondary | ICD-10-CM | POA: Diagnosis not present

## 2024-01-29 DIAGNOSIS — I5032 Chronic diastolic (congestive) heart failure: Secondary | ICD-10-CM | POA: Diagnosis not present

## 2024-01-29 DIAGNOSIS — Z950 Presence of cardiac pacemaker: Secondary | ICD-10-CM | POA: Diagnosis not present

## 2024-01-29 DIAGNOSIS — Z48812 Encounter for surgical aftercare following surgery on the circulatory system: Secondary | ICD-10-CM | POA: Diagnosis not present

## 2024-01-29 DIAGNOSIS — I8390 Asymptomatic varicose veins of unspecified lower extremity: Secondary | ICD-10-CM | POA: Diagnosis not present

## 2024-01-29 NOTE — Telephone Encounter (Signed)
 Dr. Almetta, please see note from 01/23/24. Mandy's note stated you would call patient about AF. Wanted to see what next steps should be since symptomatic?

## 2024-01-29 NOTE — Telephone Encounter (Signed)
 Physical Therapist from [facility]) called to inform us  that pt has increased fatigue along with some mild dizziness. Oxygen level was good and HR was 72. Please advise

## 2024-01-30 NOTE — Telephone Encounter (Signed)
 Refer to other phone note for updated report on patient, transmission and symptoms.

## 2024-01-30 NOTE — Telephone Encounter (Addendum)
 Spoke with patient.   States she continues to struggle with increased fatigue and dizziness.  Initially after procedure had about 3 days of feeling great but then started feeling bad again.   Side note, detected on phone patient having some congestion/sore throat issues that she attributes to allergies.  Recommended she follow up with PCP if progresses.    UPDATED TRANSMISSION:  (currently back in AP/VS rhythm).  She states yesterday was a very bad day.  This is reflected in the report as 11/11 appears to have high burden of intermittent events.  Patient goes very fast when in AF.  15 AF events, 8 FAST AV all consistent with AF/RVR, longest duration 23 hours. Patient is not on an OAC.  Flagging for update to Dr. Almetta and recommendations for next steps. Patient expecting a follow up call from nursing tomorrow.

## 2024-01-31 ENCOUNTER — Telehealth: Payer: Self-pay | Admitting: Student in an Organized Health Care Education/Training Program

## 2024-01-31 DIAGNOSIS — I4819 Other persistent atrial fibrillation: Secondary | ICD-10-CM

## 2024-01-31 MED ORDER — METOPROLOL SUCCINATE ER 100 MG PO TB24: 100.0000 mg | ORAL_TABLET | Freq: Every day | ORAL | 3 refills | Status: AC

## 2024-01-31 NOTE — Telephone Encounter (Signed)
 Spoke with her on the phone today. Sending toprol  XL 100 mg daily. She has metoprolol  tartrate 25 mg bid prescribed now, will make this prn. Not currently on OAC. Will assess whether Watchman candidate with recurrent GIB. Also can consider ablation if she can tolerate short term OAC. Have provided my contact information and she will call back after discussing with her daughter for overbook next week Tuesday or Thursday.

## 2024-02-01 NOTE — Telephone Encounter (Signed)
 See separate phone encounter documenting Dr. Garnette discussion with patient.

## 2024-02-04 NOTE — Telephone Encounter (Signed)
 Spoke w/ patient - advised her of Dr. Garnette message and that I have her down to see Dr. Almetta 11/18 at 1:30pm. She confirmed the appointment and was thankful for call.

## 2024-02-05 ENCOUNTER — Ambulatory Visit
Attending: Student in an Organized Health Care Education/Training Program | Admitting: Student in an Organized Health Care Education/Training Program

## 2024-02-05 ENCOUNTER — Encounter: Payer: Self-pay | Admitting: Student in an Organized Health Care Education/Training Program

## 2024-02-05 VITALS — BP 120/72 | HR 60 | Ht 63.0 in | Wt 250.0 lb

## 2024-02-05 DIAGNOSIS — I4819 Other persistent atrial fibrillation: Secondary | ICD-10-CM | POA: Diagnosis not present

## 2024-02-05 LAB — CUP PACEART INCLINIC DEVICE CHECK
Date Time Interrogation Session: 20251118135308
Implantable Lead Connection Status: 753985
Implantable Lead Connection Status: 753985
Implantable Lead Implant Date: 20251023
Implantable Lead Implant Date: 20251023
Implantable Lead Location: 753859
Implantable Lead Location: 753860
Implantable Lead Model: 5076
Implantable Lead Model: 5076
Implantable Pulse Generator Implant Date: 20251023

## 2024-02-05 MED ORDER — APIXABAN 5 MG PO TABS
5.0000 mg | ORAL_TABLET | Freq: Two times a day (BID) | ORAL | 3 refills | Status: AC
Start: 2024-02-05 — End: ?

## 2024-02-05 NOTE — Progress Notes (Unsigned)
  Cardiology Office Note   Date:  02/05/2024  ID:  Kim Dixon, DOB July 11, 1940, MRN [id number] PCP: Sheryle Carwin, MD  [facility] Providers Cardiologist:  Jayson Sierras, MD Cardiology APP:  Johnson Laymon HERO, PA-C { Click to update primary MD,subspecialty MD or APP then REFRESH:1}    History of Present Illness Kim Dixon is a 83 y.o. female ***  ROS: ***  Studies Reviewed      *** Risk Assessment/Calculations {Does this patient have ATRIAL FIBRILLATION?:[phone]} No BP recorded.  {Refresh Note OR Click here to enter BP  :1}***       Physical Exam VS:  There were no vitals taken for this visit.       Wt Readings from Last 3 Encounters:  01/02/24 264 lb 15.9 oz (120.2 kg)  01/02/24 265 lb (120.2 kg)  10/23/23 265 lb (120.2 kg)    GEN: Well nourished, well developed in no acute distress NECK: No JVD; No carotid bruits CARDIAC: ***RRR, no murmurs, rubs, gallops RESPIRATORY:  Clear to auscultation without rales, wheezing or rhonchi  ABDOMEN: Soft, non-tender, non-distended EXTREMITIES:  No edema; No deformity   ASSESSMENT AND PLAN ***    {Are you ordering a CV Procedure (e.g. stress test, cath, DCCV, TEE, etc)?   Press F2        :[id number]}  Dispo: ***  Signed, Donnice DELENA Primus, MD

## 2024-02-05 NOTE — Progress Notes (Unsigned)
 Cardiology Office Note   Date: 02/05/24 ID:  Kim Dixon, DOB 1940-06-30, MRN [id number] PCP: Sheryle Carwin, MD  [facility] Providers Cardiologist:  Jayson Sierras, MD Cardiology APP:  Johnson Laymon HERO, PA-C  Electrophysiologist:  Donnice DELENA Primus, MD   History of Present Illness Kim Dixon is a 83 y.o. 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  She was recently evaluated in the hospital for bradycardia with associated hypotension in the setting of junctional bradycardia while on outpatient amiodarone  and beta-blocker.  She was also found to be markedly hypothyroid and was started on Synthroid with discontinuation of amiodarone .  She underwent successful LEFT MDT DC PPM implant with Dr. Cindie on 01/10/2024.  She was having frequent episodes of AF/RVR on low-dose metoprolol  tartrate so I arranged for a more urgent follow-up.  Today she presents to clinic and is overall feeling well.  Device interrogation with AS-VP.  She reports good days and bad days and although she does not have clear rhythm awareness she does notice an association with AF/RVR and significant fatigue.  ROS: fatigue  Studies Reviewed  ECG review 02/05/24: APVP 60, PR 136, QRS 158, QT/c 490/490 01/11/24: APVS 60, PR 212, QRS 100, QT/c 428/428 01/07/24: AF/RVR 115, QRS 92, QT/c 350/484 01/02/24: JR 29, QRS 99, QT/c 223/155 07/25/23: AFL/RVR 121, QRS 72, QT/c 198/281  TTE  Result date: 01/02/24  1. Left ventricular ejection fraction, by estimation, is 60 to 65%. The  left ventricle has normal function. The left ventricle has no regional  wall motion abnormalities. There is mild concentric left ventricular  hypertrophy. Left ventricular diastolic  parameters are consistent with Grade III diastolic dysfunction  (restrictive). Elevated left atrial pressure.   2. Right ventricular  systolic function is normal. The right ventricular  size is normal.   3. Left atrial size was mildly dilated.   4. Right atrial size was mildly dilated.   5. The mitral valve is normal in structure. Mild mitral valve  regurgitation. No evidence of mitral stenosis.   6. The aortic valve is normal in structure. Aortic valve regurgitation is  not visualized. No aortic stenosis is present.   7. The inferior vena cava is normal in size with greater than 50%  respiratory variability, suggesting right atrial pressure of 3 mmHg.   Risk Assessment/Calculations  CHA2DS2-VASc Score = 8  This indicates a 10.8% annual risk of stroke. The patient's score is based upon: CHF History: 1 HTN History: 1 Diabetes History: 1 Stroke History: 2 Vascular Disease History: 0 Age Score: 2 Gender Score: 1  Physical Exam VS:  BP 120/72   Pulse 60   Ht 5' 3 (1.6 m)   Wt 250 lb (113.4 kg)   SpO2 94%   BMI 44.29 kg/m       Wt Readings from Last 3 Encounters:  02/05/24 250 lb (113.4 kg)  01/02/24 264 lb 15.9 oz (120.2 kg)  01/02/24 265 lb (120.2 kg)    GEN: Well nourished, well developed in no acute distress CARDIAC: RRR, no murmurs, rubs, gallops RESPIRATORY:  Clear to auscultation without rales, wheezing or rhonchi  EXTREMITIES:  No edema; No deformity  SKIN: LEFT infraclavicular incision well healed, no erythema, swelling or hematoma   Device information PPM: MDT [id number] Azure XT DR MRI, SN [id number], DOI 01/10/24 RA: MDT 5076 CapSureFix Nov[employer] MRI SureScan, SN [id number], DOI 01/10/24 RV: MDT  5076 CapSureFix Novus MRI SureScan, SN [id number], DOI 01/10/24  ASSESSMENT AND PLAN Kim Dixon is a 83 y.o. 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  SSS Persistent AF Junctional bradycardia LEFT MDT DC PPM  We discussed several options for management of her AF/RVR.  She is currently  rate controlled on Toprol -XL 100 mg daily.  We discussed ablation as one option for management of her AF.  We also discussed Watchman implant as a possibility to stop OAC long-term.  She had a GI bleed with extensive workup including EGD, colonoscopy, push enteroscopy and pill endoscopy.  Despite evaluation there was not any identifiable source of her bleeding although it resolved after discontinuation of OAC.  She has not had any recurrent bleeding since stopping her OAC.  Today she wants to proceed with rate control and resumption of OAC.  She is a candidate for concomitant ablation and Watchman implant if she decides to proceed in the future.   Discussed treatment options today for AF including antiarrhythmic drug therapy and ablation. Discussed risks, recovery and likelihood of success with each treatment strategy. Risk, benefits, and alternatives to EP study and ablation for afib were discussed. These risks include but are not limited to stroke, bleeding, vascular damage, tamponade, perforation, damage to the esophagus, lungs, phrenic nerve and other structures, worsening renal function, coronary vasospasm and death.  Discussed potential need for repeat ablation procedures and antiarrhythmic drugs after an initial ablation. The patient understands these risk and for now wants to manage her AF with rate control.   I have seen Kim Dixon in the office today who is being considered for a Watchman left atrial appendage closure device. I believe they will benefit from this procedure given their history of atrial fibrillation, CHA2DS2-VASc score of 8 and unadjusted ischemic stroke rate of 10/8% per year. Unfortunately, the patient is not felt to be a long term anticoagulation candidate secondary to GIB. The patient's chart has been reviewed and I feel that they would be a candidate for short term oral anticoagulation after Watchman implant.   It is my belief that after undergoing a LAA closure  procedure, Kim Dixon will not need long term anticoagulation which eliminates anticoagulation side effects and major bleeding risk.   Procedural risks for the Watchman implant have been reviewed with the patient including a 0.5% risk of stroke, <1% risk of perforation and <1% risk of device embolization. Other risks include bleeding, vascular damage, tamponade, worsening renal function, and death. The patient understands these risk but for now would like to resume OAC as opposed to proceeding with Watchman.   The published clinical data on the safety and effectiveness of WATCHMAN include but are not limited to the following: - Holmes DR, Jess BEARD, Sick P et al. for the PROTECT AF Investigators. Percutaneous closure of the left atrial appendage versus warfarin therapy for prevention of stroke in patients with atrial fibrillation: a randomised non-inferiority trial. Lancet 2009; 374: 534-42. GLENWOOD Jess BEARD, Doshi SK, Jonita VEAR Satchel D et al. on behalf of the PROTECT AF Investigators. Percutaneous Left Atrial Appendage Closure for Stroke Prophylaxis in Patients With Atrial Fibrillation 2.3-Year Follow-up of the PROTECT AF (Watchman Left Atrial Appendage System for Embolic Protection in Patients With Atrial Fibrillation) Trial. Circulation 2013; 127:720-729. - Alli O, Doshi S,  Kar S, Reddy VY, Sievert H et al. Quality of Life Assessment in  the Randomized PROTECT AF (Percutaneous Closure of the Left Atrial Appendage Versus Warfarin Therapy for Prevention of Stroke in Patients With Atrial Fibrillation) Trial of Patients at Risk for Stroke With Nonvalvular Atrial Fibrillation. J Am Coll Cardiol 2013; 61:1790-8. GLENWOOD Satchel DR, Archer RAMAN, Price M, Whisenant B, Sievert H, Doshi S, Huber K, Reddy V. Prospective randomized evaluation of the Watchman left atrial appendage Device in patients with atrial fibrillation versus long-term warfarin therapy; the PREVAIL trial. Journal of the [employer] of Cardiology, Vol.  4, No. 1, 2014, 1-11. - Kar S, Doshi SK, Sadhu A, Horton R, Osorio J et al. Primary outcome evaluation of a next-generation left atrial appendage closure device: results from the PINNACLE FLX trial. Circulation 2021;143(18)1754-1762.   HAS-BLED score: 2 Hypertension No  Abnormal renal and liver function (Dialysis, transplant, Cr >2.26 mg/dL /Cirrhosis or Bilirubin >2x Normal or AST/ALT/AP >3x Normal) No  Stroke No  Bleeding Yes  Labile INR (Unstable/high INR) No  Elderly (>65) Yes  Drugs or alcohol (>= 8 drinks/week, anti-plt or NSAID) No   CHA2DS2-VASc Score = 8  The patient's score is based upon: CHF History: 1 HTN History: 1 Diabetes History: 1 Stroke History: 2 Vascular Disease History: 0 Age Score: 2 Gender Score: 1  Dispo: RTC 3 months in [place]   A total of 50 minutes was spent preparing for the patient, reviewing history, performing exam, document encounter, coordinating care and counseling the patient. 35 minutes was spent with direct patient care.   Signed, Donnice DELENA Primus, MD

## 2024-02-05 NOTE — Patient Instructions (Signed)
 Medication Instructions:  Your physician recommends that you continue on your current medications as directed. Please refer to the Current Medication list given to you today.  *If you need a refill on your cardiac medications before your next appointment, please call your pharmacy*  Lab Work: None ordered.  If you have labs (blood work) drawn today and your tests are completely normal, you will receive your results only by: MyChart Message (if you have MyChart) OR A paper copy in the mail If you have any lab test that is abnormal or we need to change your treatment, we will call you to review the results.  Testing/Procedures: None ordered.   Follow-Up: At [facility], you and your health needs are our priority.  As part of our continuing mission to provide you with exceptional heart care, our providers are all part of one team.  This team includes your primary Cardiologist (physician) and Advanced Practice Providers or APPs (Physician Assistants and Nurse Practitioners) who all work together to provide you with the care you need, when you need it.  Your next appointment:   As scheduled with Dr Almetta in [place] office

## 2024-02-07 ENCOUNTER — Ambulatory Visit: Payer: Self-pay | Admitting: Student in an Organized Health Care Education/Training Program

## 2024-02-13 DIAGNOSIS — I495 Sick sinus syndrome: Secondary | ICD-10-CM | POA: Diagnosis not present

## 2024-02-13 DIAGNOSIS — Z48812 Encounter for surgical aftercare following surgery on the circulatory system: Secondary | ICD-10-CM | POA: Diagnosis not present

## 2024-02-13 DIAGNOSIS — I8390 Asymptomatic varicose veins of unspecified lower extremity: Secondary | ICD-10-CM | POA: Diagnosis not present

## 2024-02-13 DIAGNOSIS — I110 Hypertensive heart disease with heart failure: Secondary | ICD-10-CM | POA: Diagnosis not present

## 2024-02-13 DIAGNOSIS — N179 Acute kidney failure, unspecified: Secondary | ICD-10-CM | POA: Diagnosis not present

## 2024-02-13 DIAGNOSIS — I480 Paroxysmal atrial fibrillation: Secondary | ICD-10-CM | POA: Diagnosis not present

## 2024-02-13 DIAGNOSIS — Z950 Presence of cardiac pacemaker: Secondary | ICD-10-CM | POA: Diagnosis not present

## 2024-02-13 DIAGNOSIS — I959 Hypotension, unspecified: Secondary | ICD-10-CM | POA: Diagnosis not present

## 2024-02-20 DIAGNOSIS — D649 Anemia, unspecified: Secondary | ICD-10-CM | POA: Diagnosis not present

## 2024-02-20 DIAGNOSIS — Z79899 Other long term (current) drug therapy: Secondary | ICD-10-CM | POA: Diagnosis not present

## 2024-02-20 DIAGNOSIS — K922 Gastrointestinal hemorrhage, unspecified: Secondary | ICD-10-CM | POA: Diagnosis not present

## 2024-02-20 DIAGNOSIS — N1832 Chronic kidney disease, stage 3b: Secondary | ICD-10-CM | POA: Diagnosis not present

## 2024-02-20 DIAGNOSIS — E1129 Type 2 diabetes mellitus with other diabetic kidney complication: Secondary | ICD-10-CM | POA: Diagnosis not present

## 2024-02-21 ENCOUNTER — Ambulatory Visit: Attending: Student in an Organized Health Care Education/Training Program

## 2024-02-21 DIAGNOSIS — I4819 Other persistent atrial fibrillation: Secondary | ICD-10-CM | POA: Diagnosis not present

## 2024-02-23 LAB — CUP PACEART REMOTE DEVICE CHECK
Battery Remaining Longevity: 160 mo
Battery Voltage: 3.22 V
Brady Statistic AP VP Percent: 0.17 %
Brady Statistic AP VS Percent: 99.77 %
Brady Statistic AS VP Percent: 0.02 %
Brady Statistic AS VS Percent: 0.04 %
Brady Statistic RA Percent Paced: 98.33 %
Brady Statistic RV Percent Paced: 0.24 %
Date Time Interrogation Session: 20251204012932
Implantable Lead Connection Status: 753985
Implantable Lead Connection Status: 753985
Implantable Lead Implant Date: 20251023
Implantable Lead Implant Date: 20251023
Implantable Lead Location: 753859
Implantable Lead Location: 753860
Implantable Lead Model: 5076
Implantable Lead Model: 5076
Implantable Pulse Generator Implant Date: 20251023
Lead Channel Impedance Value: 285 Ohm
Lead Channel Impedance Value: 380 Ohm
Lead Channel Impedance Value: 456 Ohm
Lead Channel Impedance Value: 475 Ohm
Lead Channel Pacing Threshold Amplitude: 0.625 V
Lead Channel Pacing Threshold Amplitude: 0.75 V
Lead Channel Pacing Threshold Pulse Width: 0.4 ms
Lead Channel Pacing Threshold Pulse Width: 0.4 ms
Lead Channel Sensing Intrinsic Amplitude: 1.5 mV
Lead Channel Sensing Intrinsic Amplitude: 1.5 mV
Lead Channel Sensing Intrinsic Amplitude: 10.875 mV
Lead Channel Sensing Intrinsic Amplitude: 10.875 mV
Lead Channel Setting Pacing Amplitude: 2 V
Lead Channel Setting Pacing Amplitude: 2 V
Lead Channel Setting Pacing Pulse Width: 0.4 ms
Lead Channel Setting Sensing Sensitivity: 1.2 mV
Zone Setting Status: 755011

## 2024-02-25 ENCOUNTER — Ambulatory Visit: Admitting: Neurology

## 2024-02-25 ENCOUNTER — Encounter: Payer: Self-pay | Admitting: Neurology

## 2024-02-25 VITALS — BP 132/74 | HR 77 | Resp 15 | Ht 63.5 in

## 2024-02-25 DIAGNOSIS — Z950 Presence of cardiac pacemaker: Secondary | ICD-10-CM | POA: Insufficient documentation

## 2024-02-25 DIAGNOSIS — R404 Transient alteration of awareness: Secondary | ICD-10-CM | POA: Insufficient documentation

## 2024-02-25 DIAGNOSIS — I4891 Unspecified atrial fibrillation: Secondary | ICD-10-CM | POA: Diagnosis not present

## 2024-02-25 NOTE — Progress Notes (Signed)
 Chief Complaint  Patient presents with   New Patient (Initial Visit)    Rm13, daughter present, NX Dr. Hewitt 2020/internal referral for Seizure disorder: no recent sz       ASSESSMENT AND PLAN  Kim Dixon is a 83 y.o. female   Transient unresponsive episode  Long history of atrial fibrillation, now symptomatic bradycardia,  Episode most suggestive of hypoperfusion of the brain due to her cardiac issue,  Less suggestive of seizure, EEG,  Keep low-dose Keppra  250 mg twice a day  She just had pacemaker January 10, 2024, if she remains episode free, may consider taper off Keppra  at next visit in 6 months  No driving until episode free in 6 months   DIAGNOSTIC DATA (LABS, IMAGING, TESTING) - I reviewed patient records, labs, notes, testing and imaging myself where available.   MEDICAL HISTORY:  Kim Dixon, is a 83 year old female companied by her daughter seen in request by primary care from [facility], Dr. Sheryle Carwin, for evaluation of transient loss of consciousness  History is obtained from the patient and review of electronic medical records. I personally reviewed pertinent available imaging films in PACS.   PMHx of  HTN HLD Hypothyroidism Anxiety Pacemaker placement in Oct 2025 A fib, on Eliquis  Obesity  She had long history of atrial fibrillation on Eliquis , but herself often does not aware her heart rate variations, had pacemaker placement on January 10, 2024, she presented with bradycardia with associated hypotension, markedly hypothyroidism, was started on treatment, discontinuation of amiodarone   Since pacemaker placement, she did not have passing out episode  Her passing out episode started around 2021, was treated by Dr. Milton, each episode was similar, she can have up to 6-7 episode in 1 day, describes sudden onset of transient loss of consciousness, often not proceeding by adding warning signs, lasting less than 1 minute, she can snap out  of it, daughter witnessed the 1 episode, she suddenly stopped conversation, staring to the spells, she can hear her daughter, but could not respond to her, there was no body shaking episode  She was treated as a possible partial seizure, started on Keppra  250 mg twice a day around 4 years ago, there was no significant change in her episode taking Keppra , no significant side effect as well  MRI of brain w/wo in June 2022 No evidence of recent infarction, hemorrhage, or mass. No abnormal enhancement. Laboratory 2025: CBC hemoglobin of 10.5, BMP creatinine of 1.3, GFR of 40, free T4 0.35, T3 30, TSH of 78  PHYSICAL EXAM:   Vitals:   02/25/24 1420  BP: 132/74  Pulse: 77  Resp: 15  SpO2: 94%  Height: 5' 3.5 (1.613 m)   Body mass index is 43.59 kg/m.  PHYSICAL EXAMNIATION:  Gen: NAD, conversant, well nourised, well groomed                     Cardiovascular: Regular rate rhythm, no peripheral edema, warm, nontender. Eyes: Conjunctivae clear without exudates or hemorrhage Neck: Supple, no carotid bruits. Pulmonary: Clear to auscultation bilaterally   NEUROLOGICAL EXAM:  MENTAL STATUS: Speech/cognition: Obese, awake, alert, oriented to history taking and casual conversation CRANIAL NERVES: CN II: Visual fields are full to confrontation. Pupils are round equal and briskly reactive to light. CN III, IV, VI: extraocular movement are normal. No ptosis. CN V: Facial sensation is intact to light touch CN VII: Face is symmetric with normal eye closure  CN VIII: Hearing is normal to causal  conversation. CN IX, X: Phonation is normal. CN XI: Head turning and shoulder shrug are intact  MOTOR: There is no pronator drift of out-stretched arms. Muscle bulk and tone are normal. Muscle strength is normal.  REFLEXES: Reflexes are 2+ and symmetric at the biceps, triceps, knees, and ankles. Plantar responses are flexor.  SENSORY: Intact to light touch, pinprick and vibratory sensation are  intact in fingers and toes.  COORDINATION: There is no trunk or limb dysmetria noted.  GAIT/STANCE: Pushing up, cautious, limited by her big body habitus  REVIEW OF SYSTEMS:  Full 14 system review of systems performed and notable only for as above All other review of systems were negative.   ALLERGIES: Allergies  Allergen Reactions   Naproxen Sodium Other (See Comments)    Tightness in throat    HOME MEDICATIONS: Current Outpatient Medications  Medication Sig Dispense Refill   acetaminophen  (TYLENOL ) 500 MG tablet Take 1,000 mg by mouth every 6 (six) hours as needed for mild pain (pain score 1-3).     albuterol  (VENTOLIN  HFA) 108 (90 Base) MCG/ACT inhaler Inhale 2 puffs into the lungs every 6 (six) hours as needed for wheezing or shortness of breath. 8 g 2   apixaban  (ELIQUIS ) 5 MG TABS tablet Take 1 tablet (5 mg total) by mouth 2 (two) times daily. 180 tablet 3   Ascorbic Acid (VITAMIN C) 1000 MG tablet Take 3,000 mg by mouth daily.      atorvastatin (LIPITOR) 10 MG tablet Take 10 mg by mouth every Monday, Wednesday, and Friday at 8 PM.      bimatoprost (LUMIGAN) 0.01 % SOLN Place 1 drop into both eyes at bedtime.     calcium carbonate (TUMS - DOSED IN MG ELEMENTAL CALCIUM) 500 MG chewable tablet Chew 1 tablet by mouth daily as needed for indigestion.     Cyanocobalamin  (VITAMIN B-12 SL) Place 1 tablet under the tongue daily.     escitalopram (LEXAPRO) 10 MG tablet Take 10 mg by mouth daily.     fluticasone  furoate-vilanterol (BREO ELLIPTA ) 100-25 MCG/ACT AEPB Inhale 1 puff into the lungs daily. (Patient taking differently: Inhale 1 puff into the lungs every evening.) 30 each 5   levETIRAcetam  (KEPPRA ) 500 MG tablet Take 250 mg by mouth 2 (two) times daily.     levothyroxine  (SYNTHROID ) 50 MCG tablet Take 1 tablet (50 mcg total) by mouth daily at 6 (six) AM. 30 tablet 2   Menthol , Topical Analgesic, (BIOFREEZE ROLL-ON EX) Apply 1 application  topically 2 (two) times daily as needed  (back pain).     metoprolol  succinate (TOPROL -XL) 100 MG 24 hr tablet Take 1 tablet (100 mg total) by mouth daily. Take with or immediately following a meal. 90 tablet 3   omeprazole  (PRILOSEC) 40 MG capsule Take 1 capsule (40 mg total) by mouth 2 (two) times daily. 180 capsule 1   potassium chloride  SA (KLOR-CON  M) 20 MEQ tablet TAKE ONE TABLET ( TOTAL) BY MOUTH DAILY (Patient taking differently: Take 20 mEq by mouth daily.) 90 tablet 3   timolol (TIMOPTIC) 0.5 % ophthalmic solution Place 1 drop into both eyes daily.     torsemide  (DEMADEX ) 20 MG tablet Take 1 tablet (20 mg total) by mouth daily. 90 tablet 3   Vitamin D3 (VITAMIN D) 25 MCG tablet Take 1,000 Units by mouth daily.     Current Facility-Administered Medications  Medication Dose Route Frequency Provider Last Rate Last Admin   sodium chloride  flush (NS) 0.9 % injection 3 mL  3 mL Intravenous Q12H Debera Jayson MATSU, MD        PAST MEDICAL HISTORY: Past Medical History:  Diagnosis Date   Anxiety    Arthritis    Basal cell carcinoma    RIGHT THIGH BCC CX3 5FU   Basal cell carcinoma 12/16/2019   right anterior neck (CX35FU)   Depression    Diverticulosis    Essential hypertension    Glaucoma    Hematuria    History of kidney stones    History of pneumonia    Hyperlipidemia    OSA (obstructive sleep apnea) 10/30/2022   PAF (paroxysmal atrial fibrillation) (HCC)    Pneumonia    SCC (squamous cell carcinoma) 2023   left forearm inf. tx cx3 43fu   Squamous cell carcinoma of skin 2023   left forearm sup tx cx3 46fu   Stress incontinence    TIA (transient ischemic attack)    January 2020   Urinary frequency    Varicose veins     PAST SURGICAL HISTORY: Past Surgical History:  Procedure Laterality Date   ABDOMINAL HYSTERECTOMY     ABLATION SAPHENOUS VEIN W/ RFA Bilateral    CATARACT EXTRACTION Bilateral    COLONOSCOPY     COLONOSCOPY N/A 06/18/2013   Procedure: COLONOSCOPY;  Surgeon: Claudis RAYMOND Rivet, MD;   [facility];  Service: Endoscopy;  Laterality: N/A;  830   COLONOSCOPY N/A 10/02/2023   Procedure: COLONOSCOPY;  Surgeon: Eartha Angelia Sieving, MD;  Location: [facility];  Service: Gastroenterology;  Laterality: N/A;  10:30 AM, ASA 1/2   CYSTOSCOPY WITH RETROGRADE PYELOGRAM, URETEROSCOPY AND STENT PLACEMENT Left 10/18/2018   Procedure: CYSTOSCOPY WITH RETROGRADE PYELOGRAM, URETEROSCOPY AND STENT PLACEMENT;  Surgeon: Alvaro Hummer, MD;  Location: [facility];  Service: Urology;  Laterality: Left;  1 HR   ESOPHAGOGASTRODUODENOSCOPY N/A 09/19/2023   Procedure: EGD (ESOPHAGOGASTRODUODENOSCOPY);  Surgeon: Eartha Angelia, Sieving, MD;  [facility]: [facility];  Service: Gastroenterology;  Laterality: N/A;  730am, asa 1-2   EYE SURGERY     laser surgery bilat    GIVENS CAPSULE STUDY N/A 10/23/2023   Procedure: IMAGING PROCEDURE, GI TRACT, INTRALUMINAL, VIA CAPSULE;  Surgeon: Eartha Angelia, Sieving, MD;  [facility];  Service: Gastroenterology;  Laterality: N/A;  730am   HOLMIUM LASER APPLICATION Left 10/18/2018   Procedure: HOLMIUM LASER APPLICATION;  Surgeon: Alvaro Hummer, MD;  Location: [facility];  Service: Urology;  Laterality: Left;   LEFT HEART CATH AND CORONARY ANGIOGRAPHY N/A 11/28/2019   Procedure: LEFT HEART CATH AND CORONARY ANGIOGRAPHY;  Surgeon: Darron Deatrice LABOR, MD;  [facility]: [facility];  Service: Cardiovascular;  Laterality: N/A;   LITHOTRIPSY     MASS EXCISION N/A 12/07/2017   Procedure: EXCISION 3CM CYST ON BACK;  Surgeon: Mavis Anes, MD;  Location: [facility];  Service: General;  Laterality: N/A;   PACEMAKER IMPLANT N/A 01/10/2024   Procedure: PACEMAKER IMPLANT;  Surgeon: Cindie Ole DASEN, MD;  Location: [facility];  Service: Cardiovascular;  Laterality: N/A;   PARATHYROIDECTOMY Left 04/22/2019   Procedure: LEFT INFERIOR PARATHYROIDECTOMY;  Surgeon: Eletha Boas, MD;  Location: [facility];  Service: General;  Laterality: Left;    TONSILLECTOMY     TOTAL KNEE ARTHROPLASTY Left 08/25/2014   Procedure: LEFT TOTAL KNEE ARTHROPLASTY;  Surgeon: Donnice Car, MD;  Location: [facility];  Service: Orthopedics;  Laterality: Left;    FAMILY HISTORY: Family History  Problem Relation Age of Onset   Coronary artery disease Mother  Lung cancer Father    Colon cancer Neg Hx    Stomach cancer Neg Hx     SOCIAL HISTORY: Social History   Socioeconomic History   Marital status: Widowed    Spouse name: Not on file   Number of children: 3   Years of education: college   Highest education level: Bachelor's degree (e.g., BA, AB, BS)  Occupational History   Occupation: Retired  Tobacco Use   Smoking status: Former    Current packs/day: 0.00    Average packs/day: 3.0 packs/day for 30.0 years (90.0 ttl pk-yrs)    Types: Cigarettes    Start date: 04/05/1963    Quit date: 04/04/1993    Years since quitting: 30.9   Smokeless tobacco: Never  Vaping Use   Vaping status: Never Used  Substance and Sexual Activity   Alcohol use: Yes    Comment: Nightly glass of wine   Drug use: No   Sexual activity: Yes    Birth control/protection: Surgical  Other Topics Concern   Not on file  Social History Narrative   Lives alone.   Right-handed.   One cup coffee per day.  Occasional soda.   Social Drivers of Corporate Investment Banker Strain: Not on file  Food Insecurity: No Food Insecurity (01/14/2024)   Hunger Vital Sign    Worried About Running Out of Food in the Last Year: Never true    Ran Out of Food in the Last Year: Never true  Transportation Needs: No Transportation Needs (01/14/2024)   PRAPARE - Administrator, Civil Service (Medical): No    Lack of Transportation (Non-Medical): No  Physical Activity: Not on file  Stress: Not on file  Social Connections: Moderately Integrated (01/02/2024)   Social Connection and Isolation Panel    Frequency of Communication with Friends and Family: More than three times a  week    Frequency of Social Gatherings with Friends and Family: More than three times a week    Attends Religious Services: More than 4 times per year    Active Member of [employer] or Organizations: Yes    Attends Banker Meetings: More than 4 times per year    Marital Status: Widowed  Intimate Partner Violence: Not At Risk (01/14/2024)   Humiliation, Afraid, Rape, and Kick questionnaire    Fear of Current or Ex-Partner: No    Emotionally Abused: No    Physically Abused: No    Sexually Abused: No      Modena Callander, M.D. Ph.D.  [facility] [address], [address] [place], [place] [address] Ph: [phone] Fax: [phone]  CC:  Johnson Laymon CHRISTELLA DEVONNA [address] [place],  [place] [address]  Sheryle Carwin, MD

## 2024-02-26 DIAGNOSIS — I4811 Longstanding persistent atrial fibrillation: Secondary | ICD-10-CM | POA: Diagnosis not present

## 2024-02-26 DIAGNOSIS — E039 Hypothyroidism, unspecified: Secondary | ICD-10-CM | POA: Diagnosis not present

## 2024-02-26 DIAGNOSIS — I495 Sick sinus syndrome: Secondary | ICD-10-CM | POA: Diagnosis not present

## 2024-02-26 DIAGNOSIS — D649 Anemia, unspecified: Secondary | ICD-10-CM | POA: Diagnosis not present

## 2024-02-26 DIAGNOSIS — N1832 Chronic kidney disease, stage 3b: Secondary | ICD-10-CM | POA: Diagnosis not present

## 2024-02-26 DIAGNOSIS — E1122 Type 2 diabetes mellitus with diabetic chronic kidney disease: Secondary | ICD-10-CM | POA: Diagnosis not present

## 2024-02-27 NOTE — Progress Notes (Signed)
 Remote PPM Transmission

## 2024-03-03 ENCOUNTER — Ambulatory Visit: Admitting: Neurology

## 2024-03-03 DIAGNOSIS — Z950 Presence of cardiac pacemaker: Secondary | ICD-10-CM

## 2024-03-03 DIAGNOSIS — R404 Transient alteration of awareness: Secondary | ICD-10-CM

## 2024-03-03 DIAGNOSIS — R4182 Altered mental status, unspecified: Secondary | ICD-10-CM | POA: Diagnosis not present

## 2024-03-03 DIAGNOSIS — I4891 Unspecified atrial fibrillation: Secondary | ICD-10-CM

## 2024-03-12 ENCOUNTER — Ambulatory Visit: Payer: Self-pay | Admitting: Student in an Organized Health Care Education/Training Program

## 2024-03-24 ENCOUNTER — Ambulatory Visit: Payer: Self-pay | Admitting: Neurology

## 2024-03-24 NOTE — Procedures (Signed)
" ° °  HISTORY: Transient loss of consciousness.  TECHNIQUE:  This is a routine 16 channel EEG recording with one channel devoted to a limited EKG recording.  It was performed during wakefulness, drowsiness and asleep.  Hyperventilation and photic stimulation were performed as activating procedures.  There are minimum muscle and movement artifact noted.  Upon maximum arousal, posterior dominant waking rhythm consistent of rhythmic alpha range activity. Activities are symmetric over the bilateral posterior derivations and attenuated with eye opening.  Photic stimulation did not alter the tracing.  Hyperventilation produced mild/moderate buildup with higher amplitude and the slower activities noted.  During EEG recording, patient developed drowsiness and no deeper stage of sleep was achieved.  During EEG recording, there was no epileptiform discharge noted.  EKG demonstrate normal sinus rhythm.  CONCLUSION: This is a  normal awake EEG.  There is no electrodiagnostic evidence of epileptiform discharge.  Aimee Timmons, M.D. Ph.D.  [facility] [address] [place], [place] [address] Phone: [phone] Fax:      [phone]  "

## 2024-04-24 ENCOUNTER — Telehealth: Payer: Self-pay

## 2024-04-24 DIAGNOSIS — I10 Essential (primary) hypertension: Secondary | ICD-10-CM

## 2024-04-24 DIAGNOSIS — I4891 Unspecified atrial fibrillation: Secondary | ICD-10-CM

## 2024-04-24 NOTE — Progress Notes (Signed)
 Complex Care Management Note  Care Guide Note 04/24/2024 Name: Kim Dixon MRN: [id number] DOB: Aug 09, 1940  Kim Dixon is a 84 y.o. year old female who sees Sheryle Carwin, MD for primary care. I reached out to [name] by phone today to offer complex care management services.  [name] was given information about Complex Care Management services today including:   The Complex Care Management services include support from the care team which includes your Nurse Care Manager, Clinical Social Worker, or Pharmacist.  The Complex Care Management team is here to help remove barriers to the health concerns and goals most important to you. Complex Care Management services are voluntary, and the patient may decline or stop services at any time by request to their care team member.   Complex Care Management Consent Status: Patient agreed to services and verbal consent obtained.   Follow up plan:  Telephone appointment with complex care management team member scheduled for:  05/09/24 @ 3 pm  Encounter Outcome:  Patient Scheduled  Leotis Rase [facility], [facility] Guide  Direct Dial: [phone]  Fax [phone]

## 2024-04-29 ENCOUNTER — Ambulatory Visit: Admitting: Cardiology

## 2024-05-02 ENCOUNTER — Ambulatory Visit: Admitting: Student in an Organized Health Care Education/Training Program

## 2024-05-09 ENCOUNTER — Telehealth

## 2024-05-22 ENCOUNTER — Ambulatory Visit

## 2024-08-21 ENCOUNTER — Ambulatory Visit

## 2024-09-15 ENCOUNTER — Ambulatory Visit: Admitting: Adult Health

## 2024-09-23 ENCOUNTER — Ambulatory Visit: Admitting: Adult Health

## 2024-11-20 ENCOUNTER — Ambulatory Visit

## 2025-02-19 ENCOUNTER — Ambulatory Visit

## 2025-05-21 ENCOUNTER — Ambulatory Visit
